# Patient Record
Sex: Female | Born: 1940
Health system: Southern US, Community
[De-identification: ages and names within clinical notes are randomized; demographics above are authoritative.]

## PROBLEM LIST (undated history)

## (undated) DIAGNOSIS — C649 Malignant neoplasm of unspecified kidney, except renal pelvis: Secondary | ICD-10-CM

## (undated) DIAGNOSIS — C189 Malignant neoplasm of colon, unspecified: Secondary | ICD-10-CM

## (undated) DIAGNOSIS — E039 Hypothyroidism, unspecified: Secondary | ICD-10-CM

## (undated) DIAGNOSIS — T4145XA Adverse effect of unspecified anesthetic, initial encounter: Secondary | ICD-10-CM

## (undated) DIAGNOSIS — R51 Headache: Secondary | ICD-10-CM

## (undated) DIAGNOSIS — D759 Disease of blood and blood-forming organs, unspecified: Secondary | ICD-10-CM

## (undated) DIAGNOSIS — H269 Unspecified cataract: Secondary | ICD-10-CM

## (undated) DIAGNOSIS — C787 Secondary malignant neoplasm of liver and intrahepatic bile duct: Secondary | ICD-10-CM

## (undated) DIAGNOSIS — T8859XA Other complications of anesthesia, initial encounter: Secondary | ICD-10-CM

## (undated) DIAGNOSIS — C7900 Secondary malignant neoplasm of unspecified kidney and renal pelvis: Secondary | ICD-10-CM

## (undated) DIAGNOSIS — M810 Age-related osteoporosis without current pathological fracture: Secondary | ICD-10-CM

## (undated) DIAGNOSIS — R112 Nausea with vomiting, unspecified: Secondary | ICD-10-CM

## (undated) DIAGNOSIS — M1990 Unspecified osteoarthritis, unspecified site: Secondary | ICD-10-CM

## (undated) DIAGNOSIS — K760 Fatty (change of) liver, not elsewhere classified: Secondary | ICD-10-CM

## (undated) DIAGNOSIS — D649 Anemia, unspecified: Secondary | ICD-10-CM

## (undated) DIAGNOSIS — Z9889 Other specified postprocedural states: Secondary | ICD-10-CM

## (undated) HISTORY — PX: APPENDECTOMY: SHX54

## (undated) HISTORY — PX: COLON SURGERY: SHX602

## (undated) HISTORY — DX: Fatty (change of) liver, not elsewhere classified: K76.0

## (undated) HISTORY — DX: Age-related osteoporosis without current pathological fracture: M81.0

---

## 1991-11-18 HISTORY — PX: ABDOMINAL HYSTERECTOMY: SHX81

## 1991-11-18 HISTORY — PX: OTHER SURGICAL HISTORY: SHX169

## 1999-10-18 ENCOUNTER — Other Ambulatory Visit: Admission: RE | Admit: 1999-10-18 | Discharge: 1999-10-18 | Payer: Self-pay | Admitting: *Deleted

## 2001-02-05 ENCOUNTER — Other Ambulatory Visit: Admission: RE | Admit: 2001-02-05 | Discharge: 2001-02-05 | Payer: Self-pay | Admitting: *Deleted

## 2004-12-17 ENCOUNTER — Ambulatory Visit: Payer: Self-pay | Admitting: Internal Medicine

## 2005-01-14 ENCOUNTER — Ambulatory Visit: Payer: Self-pay | Admitting: *Deleted

## 2005-02-03 ENCOUNTER — Ambulatory Visit: Payer: Self-pay | Admitting: Internal Medicine

## 2005-04-07 ENCOUNTER — Ambulatory Visit: Payer: Self-pay | Admitting: Internal Medicine

## 2005-04-09 ENCOUNTER — Ambulatory Visit (HOSPITAL_COMMUNITY): Admission: RE | Admit: 2005-04-09 | Discharge: 2005-04-09 | Payer: Self-pay | Admitting: Internal Medicine

## 2005-04-21 ENCOUNTER — Ambulatory Visit: Payer: Self-pay | Admitting: Internal Medicine

## 2005-09-22 ENCOUNTER — Ambulatory Visit: Payer: Self-pay | Admitting: Internal Medicine

## 2005-10-06 ENCOUNTER — Ambulatory Visit: Payer: Self-pay | Admitting: Internal Medicine

## 2005-10-24 ENCOUNTER — Encounter: Admission: RE | Admit: 2005-10-24 | Discharge: 2005-10-24 | Payer: Self-pay | Admitting: Internal Medicine

## 2006-01-05 ENCOUNTER — Ambulatory Visit: Payer: Self-pay | Admitting: Internal Medicine

## 2006-03-27 ENCOUNTER — Ambulatory Visit: Payer: Self-pay | Admitting: Internal Medicine

## 2006-07-03 ENCOUNTER — Ambulatory Visit: Payer: Self-pay | Admitting: Internal Medicine

## 2006-07-06 ENCOUNTER — Ambulatory Visit: Payer: Self-pay | Admitting: Internal Medicine

## 2006-08-31 ENCOUNTER — Ambulatory Visit: Payer: Self-pay | Admitting: Internal Medicine

## 2006-09-22 ENCOUNTER — Ambulatory Visit: Payer: Self-pay | Admitting: Internal Medicine

## 2006-12-07 ENCOUNTER — Ambulatory Visit: Payer: Self-pay | Admitting: Internal Medicine

## 2006-12-29 ENCOUNTER — Ambulatory Visit (HOSPITAL_COMMUNITY): Admission: RE | Admit: 2006-12-29 | Discharge: 2006-12-29 | Payer: Self-pay | Admitting: Internal Medicine

## 2007-01-07 ENCOUNTER — Ambulatory Visit (HOSPITAL_COMMUNITY): Admission: RE | Admit: 2007-01-07 | Discharge: 2007-01-07 | Payer: Self-pay | Admitting: Family Medicine

## 2007-03-08 ENCOUNTER — Ambulatory Visit: Payer: Self-pay | Admitting: Internal Medicine

## 2007-04-13 ENCOUNTER — Ambulatory Visit: Payer: Self-pay | Admitting: Internal Medicine

## 2007-04-15 ENCOUNTER — Ambulatory Visit: Admission: EM | Admit: 2007-04-15 | Discharge: 2007-04-15 | Payer: Self-pay | Admitting: Internal Medicine

## 2007-04-15 ENCOUNTER — Ambulatory Visit: Payer: Self-pay | Admitting: Vascular Surgery

## 2007-04-27 ENCOUNTER — Encounter: Admission: RE | Admit: 2007-04-27 | Discharge: 2007-05-20 | Payer: Self-pay | Admitting: Internal Medicine

## 2007-05-17 ENCOUNTER — Ambulatory Visit: Payer: Self-pay | Admitting: Internal Medicine

## 2007-07-05 ENCOUNTER — Ambulatory Visit: Payer: Self-pay | Admitting: Internal Medicine

## 2007-07-06 ENCOUNTER — Encounter (INDEPENDENT_AMBULATORY_CARE_PROVIDER_SITE_OTHER): Payer: Self-pay | Admitting: Internal Medicine

## 2007-07-07 ENCOUNTER — Ambulatory Visit: Payer: Self-pay | Admitting: Internal Medicine

## 2007-07-07 ENCOUNTER — Encounter (INDEPENDENT_AMBULATORY_CARE_PROVIDER_SITE_OTHER): Payer: Self-pay | Admitting: Nurse Practitioner

## 2007-07-07 LAB — CONVERTED CEMR LAB
Albumin: 4.2 g/dL (ref 3.5–5.2)
BUN: 16 mg/dL (ref 6–23)
CO2: 22 meq/L (ref 19–32)
Calcium: 9.6 mg/dL (ref 8.4–10.5)
Chloride: 104 meq/L (ref 96–112)
Cholesterol: 146 mg/dL (ref 0–200)
Glucose, Bld: 103 mg/dL — ABNORMAL HIGH (ref 70–99)
HDL: 46 mg/dL (ref >39)
Lymphs Abs: 1.7 10*3/uL (ref 0.7–3.3)
Monocytes Relative: 7 % (ref 3–11)
Neutro Abs: 2.6 10*3/uL (ref 1.7–7.7)
Neutrophils Relative %: 54 % (ref 43–77)
Potassium: 4.7 meq/L (ref 3.5–5.3)
RBC: 5.22 M/uL — ABNORMAL HIGH (ref 3.87–5.11)
Triglycerides: 123 mg/dL (ref <150)
WBC: 4.9 10*3/uL (ref 4.0–10.5)

## 2007-07-09 ENCOUNTER — Encounter (INDEPENDENT_AMBULATORY_CARE_PROVIDER_SITE_OTHER): Payer: Self-pay | Admitting: Nurse Practitioner

## 2007-07-09 LAB — CONVERTED CEMR LAB
Amylase: 58 units/L (ref 0–105)
HCV Ab: NEGATIVE
Hepatitis B Surface Ag: NEGATIVE

## 2007-07-12 ENCOUNTER — Telehealth (INDEPENDENT_AMBULATORY_CARE_PROVIDER_SITE_OTHER): Payer: Self-pay | Admitting: *Deleted

## 2007-07-20 ENCOUNTER — Ambulatory Visit (HOSPITAL_COMMUNITY): Admission: RE | Admit: 2007-07-20 | Discharge: 2007-07-20 | Payer: Self-pay | Admitting: Family Medicine

## 2007-08-02 ENCOUNTER — Telehealth (INDEPENDENT_AMBULATORY_CARE_PROVIDER_SITE_OTHER): Payer: Self-pay | Admitting: *Deleted

## 2007-08-02 DIAGNOSIS — N3090 Cystitis, unspecified without hematuria: Secondary | ICD-10-CM

## 2007-08-02 DIAGNOSIS — R945 Abnormal results of liver function studies: Secondary | ICD-10-CM

## 2007-08-02 DIAGNOSIS — E039 Hypothyroidism, unspecified: Secondary | ICD-10-CM

## 2007-08-02 DIAGNOSIS — M1990 Unspecified osteoarthritis, unspecified site: Secondary | ICD-10-CM

## 2007-08-04 ENCOUNTER — Encounter (INDEPENDENT_AMBULATORY_CARE_PROVIDER_SITE_OTHER): Payer: Self-pay | Admitting: *Deleted

## 2007-08-04 ENCOUNTER — Telehealth (INDEPENDENT_AMBULATORY_CARE_PROVIDER_SITE_OTHER): Payer: Self-pay | Admitting: Internal Medicine

## 2007-08-13 ENCOUNTER — Ambulatory Visit: Payer: Self-pay | Admitting: Nurse Practitioner

## 2007-08-13 DIAGNOSIS — K7689 Other specified diseases of liver: Secondary | ICD-10-CM

## 2007-08-13 DIAGNOSIS — R3919 Other difficulties with micturition: Secondary | ICD-10-CM

## 2007-08-13 LAB — CONVERTED CEMR LAB
Bilirubin Urine: NEGATIVE
Glucose, Urine, Semiquant: NEGATIVE
Specific Gravity, Urine: 1.015
Urobilinogen, UA: NEGATIVE
pH: 6

## 2007-09-28 ENCOUNTER — Telehealth (INDEPENDENT_AMBULATORY_CARE_PROVIDER_SITE_OTHER): Payer: Self-pay | Admitting: Nurse Practitioner

## 2007-12-20 ENCOUNTER — Ambulatory Visit: Payer: Self-pay | Admitting: Nurse Practitioner

## 2007-12-20 DIAGNOSIS — M899 Disorder of bone, unspecified: Secondary | ICD-10-CM | POA: Insufficient documentation

## 2007-12-20 DIAGNOSIS — M6740 Ganglion, unspecified site: Secondary | ICD-10-CM

## 2007-12-20 DIAGNOSIS — M949 Disorder of cartilage, unspecified: Secondary | ICD-10-CM

## 2007-12-21 LAB — CONVERTED CEMR LAB
ALT: 33 units/L (ref 0–35)
AST: 56 units/L — ABNORMAL HIGH (ref 0–37)
Albumin: 4.3 g/dL (ref 3.5–5.2)
Alkaline Phosphatase: 119 units/L — ABNORMAL HIGH (ref 39–117)
Basophils Absolute: 0.1 10*3/uL (ref 0.0–0.1)
Basophils Relative: 2 % — ABNORMAL HIGH (ref 0–1)
Eosinophils Absolute: 0.3 10*3/uL (ref 0.0–0.7)
Lymphs Abs: 1.4 10*3/uL (ref 0.7–4.0)
MCV: 84.6 fL (ref 78.0–100.0)
Neutrophils Relative %: 61 % (ref 43–77)
Platelets: 200 10*3/uL (ref 150–400)
Potassium: 4.5 meq/L (ref 3.5–5.3)
Sodium: 140 meq/L (ref 135–145)
Total Protein: 8 g/dL (ref 6.0–8.3)
WBC: 5.2 10*3/uL (ref 4.0–10.5)

## 2007-12-31 ENCOUNTER — Ambulatory Visit (HOSPITAL_COMMUNITY): Admission: RE | Admit: 2007-12-31 | Discharge: 2007-12-31 | Payer: Self-pay | Admitting: Family Medicine

## 2008-02-17 ENCOUNTER — Ambulatory Visit: Payer: Self-pay | Admitting: Internal Medicine

## 2008-02-17 DIAGNOSIS — J069 Acute upper respiratory infection, unspecified: Secondary | ICD-10-CM | POA: Insufficient documentation

## 2008-02-17 DIAGNOSIS — J029 Acute pharyngitis, unspecified: Secondary | ICD-10-CM | POA: Insufficient documentation

## 2008-08-07 ENCOUNTER — Telehealth (INDEPENDENT_AMBULATORY_CARE_PROVIDER_SITE_OTHER): Payer: Self-pay | Admitting: Nurse Practitioner

## 2008-08-08 ENCOUNTER — Ambulatory Visit: Payer: Self-pay | Admitting: Family Medicine

## 2008-08-10 ENCOUNTER — Ambulatory Visit: Payer: Self-pay | Admitting: Internal Medicine

## 2008-08-23 ENCOUNTER — Ambulatory Visit: Payer: Self-pay | Admitting: Family Medicine

## 2008-08-23 ENCOUNTER — Telehealth (INDEPENDENT_AMBULATORY_CARE_PROVIDER_SITE_OTHER): Payer: Self-pay | Admitting: Nurse Practitioner

## 2008-10-02 ENCOUNTER — Encounter (INDEPENDENT_AMBULATORY_CARE_PROVIDER_SITE_OTHER): Payer: Self-pay | Admitting: Family Medicine

## 2008-11-06 ENCOUNTER — Ambulatory Visit: Payer: Self-pay | Admitting: Nurse Practitioner

## 2009-03-15 ENCOUNTER — Ambulatory Visit: Payer: Self-pay | Admitting: Nurse Practitioner

## 2009-04-13 ENCOUNTER — Encounter (INDEPENDENT_AMBULATORY_CARE_PROVIDER_SITE_OTHER): Payer: Self-pay | Admitting: Nurse Practitioner

## 2009-04-13 ENCOUNTER — Ambulatory Visit: Payer: Self-pay | Admitting: Nurse Practitioner

## 2009-04-13 ENCOUNTER — Other Ambulatory Visit: Admission: RE | Admit: 2009-04-13 | Discharge: 2009-04-13 | Payer: Self-pay | Admitting: Internal Medicine

## 2009-04-13 LAB — CONVERTED CEMR LAB
AST: 26 units/L (ref 0–37)
Albumin: 4.2 g/dL (ref 3.5–5.2)
Alkaline Phosphatase: 108 units/L (ref 39–117)
BUN: 15 mg/dL (ref 6–23)
Bilirubin Urine: NEGATIVE
Blood in Urine, dipstick: NEGATIVE
Calcium: 9.5 mg/dL (ref 8.4–10.5)
Chloride: 108 meq/L (ref 96–112)
Eosinophils Relative: 2 % (ref 0–5)
Glucose, Urine, Semiquant: NEGATIVE
HCT: 43.2 % (ref 36.0–46.0)
HDL: 41 mg/dL (ref >39)
Hemoglobin: 13.4 g/dL (ref 12.0–15.0)
KOH Prep: NEGATIVE
LDL Cholesterol: 73 mg/dL (ref 0–99)
Lymphocytes Relative: 22 % (ref 12–46)
Lymphs Abs: 1.2 10*3/uL (ref 0.7–4.0)
Monocytes Absolute: 0.4 10*3/uL (ref 0.1–1.0)
Neutro Abs: 3.9 10*3/uL (ref 1.7–7.7)
OCCULT 1: NEGATIVE
Platelets: 213 10*3/uL (ref 150–400)
Potassium: 4.6 meq/L (ref 3.5–5.3)
Protein, U semiquant: NEGATIVE
Sodium: 145 meq/L (ref 135–145)
TSH: 1.302 microintl units/mL (ref 0.350–4.500)
Total Protein: 7.1 g/dL (ref 6.0–8.3)
WBC: 5.7 10*3/uL (ref 4.0–10.5)

## 2009-04-18 ENCOUNTER — Ambulatory Visit (HOSPITAL_COMMUNITY): Admission: RE | Admit: 2009-04-18 | Discharge: 2009-04-18 | Payer: Self-pay | Admitting: Family Medicine

## 2009-04-19 ENCOUNTER — Encounter (INDEPENDENT_AMBULATORY_CARE_PROVIDER_SITE_OTHER): Payer: Self-pay | Admitting: Nurse Practitioner

## 2009-04-23 ENCOUNTER — Encounter (INDEPENDENT_AMBULATORY_CARE_PROVIDER_SITE_OTHER): Payer: Self-pay | Admitting: Nurse Practitioner

## 2009-07-12 ENCOUNTER — Encounter (INDEPENDENT_AMBULATORY_CARE_PROVIDER_SITE_OTHER): Payer: Self-pay | Admitting: *Deleted

## 2009-08-24 ENCOUNTER — Telehealth (INDEPENDENT_AMBULATORY_CARE_PROVIDER_SITE_OTHER): Payer: Self-pay | Admitting: Nurse Practitioner

## 2009-09-03 ENCOUNTER — Ambulatory Visit: Payer: Self-pay | Admitting: Nurse Practitioner

## 2009-09-03 ENCOUNTER — Telehealth (INDEPENDENT_AMBULATORY_CARE_PROVIDER_SITE_OTHER): Payer: Self-pay | Admitting: Nurse Practitioner

## 2009-09-03 LAB — CONVERTED CEMR LAB: TSH: 4.279 microintl units/mL (ref 0.350–4.500)

## 2009-09-05 ENCOUNTER — Encounter (INDEPENDENT_AMBULATORY_CARE_PROVIDER_SITE_OTHER): Payer: Self-pay | Admitting: Nurse Practitioner

## 2009-09-17 ENCOUNTER — Telehealth (INDEPENDENT_AMBULATORY_CARE_PROVIDER_SITE_OTHER): Payer: Self-pay | Admitting: Nurse Practitioner

## 2010-09-05 ENCOUNTER — Telehealth (INDEPENDENT_AMBULATORY_CARE_PROVIDER_SITE_OTHER): Payer: Self-pay | Admitting: Nurse Practitioner

## 2010-09-11 ENCOUNTER — Encounter (INDEPENDENT_AMBULATORY_CARE_PROVIDER_SITE_OTHER): Payer: Self-pay | Admitting: Nurse Practitioner

## 2010-09-30 ENCOUNTER — Ambulatory Visit: Payer: Self-pay | Admitting: Nurse Practitioner

## 2010-09-30 DIAGNOSIS — R030 Elevated blood-pressure reading, without diagnosis of hypertension: Secondary | ICD-10-CM

## 2010-10-01 LAB — CONVERTED CEMR LAB
AST: 34 units/L (ref 0–37)
Albumin: 4.4 g/dL (ref 3.5–5.2)
BUN: 21 mg/dL (ref 6–23)
Basophils Relative: 0 % (ref 0–1)
CO2: 24 meq/L (ref 19–32)
Calcium: 10.1 mg/dL (ref 8.4–10.5)
Chloride: 106 meq/L (ref 96–112)
Creatinine, Ser: 1.11 mg/dL (ref 0.40–1.20)
Hemoglobin: 14.1 g/dL (ref 12.0–15.0)
Lymphocytes Relative: 22 % (ref 12–46)
Lymphs Abs: 1.4 10*3/uL (ref 0.7–4.0)
Monocytes Absolute: 0.5 10*3/uL (ref 0.1–1.0)
Monocytes Relative: 7 % (ref 3–12)
Neutro Abs: 4.4 10*3/uL (ref 1.7–7.7)
Neutrophils Relative %: 68 % (ref 43–77)
Potassium: 5 meq/L (ref 3.5–5.3)
RBC: 5.16 M/uL — ABNORMAL HIGH (ref 3.87–5.11)
T3, Free: 2.1 pg/mL — ABNORMAL LOW (ref 2.3–4.2)
TSH: 6.966 microintl units/mL — ABNORMAL HIGH (ref 0.350–4.500)
WBC: 6.5 10*3/uL (ref 4.0–10.5)

## 2010-10-08 ENCOUNTER — Encounter (INDEPENDENT_AMBULATORY_CARE_PROVIDER_SITE_OTHER): Payer: Self-pay | Admitting: *Deleted

## 2010-11-04 ENCOUNTER — Ambulatory Visit (HOSPITAL_COMMUNITY)
Admission: RE | Admit: 2010-11-04 | Discharge: 2010-11-04 | Payer: Self-pay | Source: Home / Self Care | Attending: Internal Medicine | Admitting: Internal Medicine

## 2010-11-12 ENCOUNTER — Encounter (INDEPENDENT_AMBULATORY_CARE_PROVIDER_SITE_OTHER): Payer: Self-pay | Admitting: Nurse Practitioner

## 2010-11-12 ENCOUNTER — Ambulatory Visit: Payer: Self-pay | Admitting: Nurse Practitioner

## 2010-11-13 LAB — CONVERTED CEMR LAB: T3, Free: 3.2 pg/mL (ref 2.3–4.2)

## 2010-12-08 ENCOUNTER — Encounter: Payer: Self-pay | Admitting: Internal Medicine

## 2010-12-17 NOTE — Letter (Signed)
Summary: Generic Letter  [facility]  [address] [place], [place] [address]   Phone: [phone]  Fax: [phone]        09/11/2010  [facility] [address] [place], [place]  [address]  Dear Ms. Gwyneth Sprout,  We have been unable to contact you by telephone.  Please call our office, at your earliest convenience, so that we may speak with you.  Sincerely,   Dutch Quint RN

## 2010-12-17 NOTE — Assessment & Plan Note (Signed)
Summary: Hypothyroidism   Vital Signs:  Patient profile:   70 year old female Menstrual status:  hysterectomy Weight:      210.8 pounds BMI:     31.24 Temp:     97.1 degrees F oral Pulse rate:   72 / minute Pulse rhythm:   regular Resp:     16 per minute BP sitting:   148 / 90  (left arm) Cuff size:   large  Vitals Entered By: Levon Hedger (September 30, 2010 12:04 PM)  Nutrition Counseling: Patient's BMI is greater than 25 and therefore counseled on weight management options. CC: follow-up visit thyroid Is Patient Diabetic? No Pain Assessment Patient in pain? no       Does patient need assistance? Functional Status Self care Ambulation Normal   CC:  follow-up visit thyroid.  History of Present Illness:  Pt into the office for f/u on thyoid. She presents today for her labs and f/u. No acute problems today.   Medications Prior to Update: 1)  Levothroid 200 Mcg Tabs (Levothyroxine Sodium) .... One Tablet By Mouth Daily For Thyroid 2)  Oyster-Cal 500 + D 500-125 Mg-Unit  Tabs (Calcium-Vitamin D) .... Take One Tablet By Mouth Two Times A Day 3)  Diclofenac Sodium 75 Mg  Tbec (Diclofenac Sodium) .... One Tablet By Mouth Two Times A Day For Joints 4)  Fosamax 70 Mg Tabs (Alendronate Sodium) .Marland Kitchen.. 1 Tablet By Mouth Weekly For Bones 5)  Zostavax 54098 Unt/0.63ml Solr (Zoster Vaccine Live) .... 0.63ml Subcutaneously X 1 Dose  Current Medications (verified): 1)  Levothroid 200 Mcg Tabs (Levothyroxine Sodium) .... One Tablet By Mouth Daily For Thyroid 2)  Oyster-Cal 500 + D 500-125 Mg-Unit  Tabs (Calcium-Vitamin D) .... Take One Tablet By Mouth Two Times A Day 3)  Diclofenac Sodium 75 Mg  Tbec (Diclofenac Sodium) .... One Tablet By Mouth Two Times A Day For Joints 4)  Fosamax 70 Mg Tabs (Alendronate Sodium) .Marland Kitchen.. 1 Tablet By Mouth Weekly For Bones  Allergies (verified): 1)  ! Asa  Review of Systems ENT:  +scrathy throat. CV:  Denies chest pain or discomfort. Resp:   Denies cough. GI:  Denies abdominal pain, nausea, and vomiting.  Physical Exam  General:  alert.   Head:  normocephalic.   Lungs:  normal breath sounds.   Heart:  normal rate and regular rhythm.   Neurologic:  alert & oriented X3.   Skin:  color normal.   Psych:  Oriented X3.     Impression & Recommendations:  Problem # 1:  HYPOTHYROIDISM (ICD-244.9) will check labs today Her updated medication list for this problem includes:    Levothroid 200 Mcg Tabs (Levothyroxine sodium) ..... One tablet by mouth daily for thyroid  Orders: UA Dipstick w/o Micro (manual) (11914) T-TSH (78295-62130) T-T3, Free [phone]) T-Free T4 (23300)  Problem # 2:  OSTEOARTHRITIS (ICD-715.90) will refill meds Her updated medication list for this problem includes:    Diclofenac Sodium 75 Mg Tbec (Diclofenac sodium) ..... One tablet by mouth two times a day for joints  Problem # 3:  OTHER SCREENING MAMMOGRAM (ICD-V76.12)  Orders: Mammogram (Screening) (Mammo)  Problem # 4:  ELEVATED BLOOD PRESSURE WITHOUT DIAGNOSIS OF HYPERTENSION (ICD-796.2) handout given to pt advise DASH diet  Complete Medication List: 1)  Levothroid 200 Mcg Tabs (Levothyroxine sodium) .... One tablet by mouth daily for thyroid 2)  Oyster-cal 500 + D 500-125 Mg-unit Tabs (Calcium-vitamin d) .... Take one tablet by mouth two times a day 3)  Diclofenac Sodium 75 Mg Tbec (Diclofenac sodium) .... One tablet by mouth two times a day for joints 4)  Fosamax 70 Mg Tabs (Alendronate sodium) .Marland Kitchen.. 1 tablet by mouth weekly for bones  Other Orders: T-Comprehensive Metabolic Panel (96295-28413) T-CBC w/Diff (24401-02725)  Patient Instructions: 1)  Be mindful that you are due for a colonscopy.  when you are ready for this referral inform this office and we will make the referral. 2)  Keep your appointment for mammogram 3)  Schedule follow up appointment in 1 month for lipids and blood pressure check. Goal 140/90 4)  No food after  midnght before this visit. May drink water 5)  Blood pressure - 148/90 6)  You will need to monitor the sodium in your diet as this will increase your blood pressure.  If blood pressure is consecutively high then notify this office. Prescriptions: FOSAMAX 70 MG TABS (ALENDRONATE SODIUM) 1 tablet by mouth weekly for bones  #4 Each x 10   Entered and Authorized by:   Lehman Prom FNP   Signed by:   Lehman Prom FNP on 09/30/2010   Method used:   Electronically to        [facility] (retail)       [address]       [place], [place]  [address]       Ph: [phone]       Fax: [phone]   RxID:   [id number] DICLOFENAC SODIUM 75 MG  TBEC (DICLOFENAC SODIUM) One tablet by mouth two times a day for joints  #60 x 3   Entered and Authorized by:   Lehman Prom FNP   Signed by:   Lehman Prom FNP on 09/30/2010   Method used:   Electronically to        [facility] (retail)       [address]       [place], [place]  [address]       Ph: [phone]       Fax: [phone]   RxID:   [id number]    Orders Added: 1)  Est. Patient Level III [69485] 2)  Mammogram (Screening) [Mammo] 3)  UA Dipstick w/o Micro (manual) [81002] 4)  Est. Patient Level III [46270] 5)  T-Comprehensive Metabolic Panel [80053-22900] 6)  T-CBC w/Diff [35009-38182] 7)  T-TSH [99371-69678] 8)  T-T3, Free [93810-17510] 9)  T-Free T4 [23300]    Prevention & Chronic Care Immunizations   Influenza vaccine: given at [facility]  (09/02/2010)   Influenza vaccine deferral: Not indicated  (09/30/2010)    Tetanus booster: 03/17/2006: given    Pneumococcal vaccine: Pneumovax  (04/13/2009)    H. zoster vaccine: Not documented  Colorectal Screening   Hemoccult: Not documented    Colonoscopy: Not documented  Other Screening   Pap smear:  Specimen Adequacy: Satisfactory for evaluation.   Interpretation/Result:Negative for  intraepithelial Lesion or Malignancy.   Interpretation/Result:Atrophy.     (04/13/2009)    Mammogram: ASSESSMENT: Negative - BI-RADS 1^MM DIGITAL SCREENING  (04/18/2009)    DXA bone density scan: Not documented   Smoking status: never  (03/15/2009)  Lipids   Total Cholesterol: 139  (04/13/2009)   LDL: 73  (04/13/2009)   LDL Direct: Not documented   HDL: 41  (04/13/2009)   Triglycerides: 125  (04/13/2009)

## 2010-12-17 NOTE — Progress Notes (Signed)
Summary: Query:  Refill levothyroxine?  Phone Note Outgoing Call   Summary of Call: Do you want to refill her thyroid med?  Not seen since 08/2009.   Initial call taken by: Dutch Quint RN,  September 05, 2010 3:31 PM  Follow-up for Phone Call        refill x 30 days she needs a f/u visit Follow-up by: Lehman Prom FNP,  September 05, 2010 5:51 PM  Additional Follow-up for Phone Call Additional follow up Details #1::        Noted.  Refill completed.  Left message on answering machine for pt. to return call.  Dutch Quint RN  September 06, 2010 3:32 PM  Left message on answering machine for pt. to return call.  Dutch Quint RN  September 10, 2010 4:44 PM  Left message on answering machine for pt. to return call.  Letter sent.  Dutch Quint RN  September 11, 2010 10:35 AM    Additional Follow-up for Phone Call Additional follow up Details #2::

## 2010-12-17 NOTE — Letter (Signed)
Summary: *HSN Results Follow up  [facility]  [address] [place], [place] [address]   Phone: [phone]  Fax: [phone]      10/08/2010   [facility] [address] [place], [place]  [address]   Dear  Ms. Ivorie Shaker,                            ____S.Drinkard,FNP   ____D. Gore,FNP       ____B. McPherson,MD   ____V. Rankins,MD    ____E. Mulberry,MD    ____N. Daphine Deutscher, FNP  ____D. Reche Dixon, MD    ____K. Philipp Deputy, MD    ____Other     This letter is to inform you that your recent test(s):  _______Pap Smear    _______Lab Test     _______X-ray    _______ is within acceptable limits  ___X____ requires a medication change  ___X____ requires a follow-up lab visit  _______ requires a follow-up visit with your provider   Comments:  We have been trying to reach you at [phone].  Please contact office your earliest convenience.       _________________________________________________________ If you have any questions, please contact our office                     Sincerely,  [place] Shannon [facility]

## 2010-12-19 NOTE — Assessment & Plan Note (Signed)
Summary: HTN   Vital Signs:  Patient profile:   70 year old female Menstrual status:  hysterectomy Weight:      210.4 pounds BMI:     31.18 Temp:     97.8 degrees F oral Pulse rate:   72 / minute Pulse rhythm:   regular Resp:     16 per minute BP sitting:   134 / 78  (left arm) Cuff size:   large  Vitals Entered By: Levon Hedger (November 12, 2010 11:05 AM)  Nutrition Counseling: Patient's BMI is greater than 25 and therefore counseled on weight management options. CC: follow-up visit thyroid, Hypertension Management Is Patient Diabetic? No Pain Assessment Patient in pain? no       Does patient need assistance? Functional Status Self care Ambulation Normal   CC:  follow-up visit thyroid and Hypertension Management.  History of Present Illness:  Pt into the office for f/u on thyroid. Dose increased during her last visit. Pt has been taking meds according to the change  Headache - woke today with migraine No current meds as she does not usually have migraines admits to eating ham over the weekend which did have some sodium she had been restricting sodium following last visit in efforts to lower blood  Hypertension History:      She complains of headache, but denies chest pain and palpitations.  no current meds.        Positive major cardiovascular risk factors include female age 56 years old or older and family history for ischemic heart disease (females less than 24 years old).  Negative major cardiovascular risk factors include non-tobacco-user status.     Habits & Providers  Alcohol-Tobacco-Diet     Tobacco Status: never     Passive Smoke Exposure: no  Exercise-Depression-Behavior     Does Patient Exercise: no     Drug Use: no     Seat Belt Use: 100     Sun Exposure: occasionally  Allergies (verified): 1)  ! Asa  Review of Systems General:  Denies fever. CV:  Denies chest pain or discomfort. Resp:  Denies cough. GI:  Denies abdominal pain,  nausea, and vomiting. Neuro:  Complains of headaches.  Physical Exam  General:  alert.   Head:  normocephalic.   Ears:  ear piercing(s) noted.   Lungs:  normal breath sounds.   Heart:  normal rate and regular rhythm.   Abdomen:  normal bowel sounds.   Msk:  up to the exam table Neurologic:  alert & oriented X3.   Skin:  color normal.   Psych:  Oriented X3.     Impression & Recommendations:  Problem # 1:  HYPOTHYROIDISM (ICD-244.9) will recheck labs today  Her updated medication list for this problem includes:    Levothroid 200 Mcg Tabs (Levothyroxine sodium) ..... One tablet by mouth daily for thyroid (take along with per day)    Levothroid 25 Mcg Tabs (Levothyroxine sodium) .Marland Kitchen... 1/2 tablet by mouth daily  (take along with )  Orders: T-TSH (78295-62130) T-T3, Free [phone]) T-Free T4 (23300)  Problem # 2:  ELEVATED BLOOD PRESSURE WITHOUT DIAGNOSIS OF HYPERTENSION (ICD-796.2) better today advised decrease in sodium  Complete Medication List: 1)  Levothroid 200 Mcg Tabs (Levothyroxine sodium) .... One tablet by mouth daily for thyroid (take along with per day) 2)  Oyster-cal 500 + D 500-125 Mg-unit Tabs (Calcium-vitamin d) .... Take one tablet by mouth two times a day 3)  Diclofenac Sodium 75 Mg Tbec (Diclofenac  sodium) .... One tablet by mouth two times a day for joints 4)  Fosamax 70 Mg Tabs (Alendronate sodium) .Marland Kitchen.. 1 tablet by mouth weekly for bones 5)  Levothroid 25 Mcg Tabs (Levothyroxine sodium) .... 1/2 tablet by mouth daily  (take along with )  Hypertension Assessment/Plan:      The patient's hypertensive risk group is category B: At least one risk factor (excluding diabetes) with no target organ damage.  Her calculated 10 year risk of coronary heart disease is 8 %.  Today's blood pressure is 134/78.  Her blood pressure goal is < 140/90.  Patient Instructions: 1)  Schedule lab visit in January - fasting for lipids 2)  no food after  midnight before this visit 3)  Blood pressure - doing better.  Continue to  monitor sodium in your diet. 4)  Headache - may be due to change in diet over the weekend.  May take tylenol extra strength.  Drink plenty of water.  Call office if headache does not subside in 48-72 hours. 5)  Thyroid - labs will be checked today and you will be notified of the results. 6)  Follow up in 6 months with n.martin, fnp for blood pressure and thyroid.  Will discuss colonscopy   Orders Added: 1)  Est. Patient Level III [[address]] 2)  T-TSH [60454-09811] 3)  T-T3, Free [91478-29562] 4)  T-Free T4 [23300]    Prevention & Chronic Care Immunizations   Influenza vaccine: given at [facility]  (09/02/2010)   Influenza vaccine deferral: Not indicated  (09/30/2010)    Tetanus booster: 03/17/2006: given    Pneumococcal vaccine: Pneumovax  (04/13/2009)    H. zoster vaccine: Not documented  Colorectal Screening   Hemoccult: Not documented    Colonoscopy: Not documented   Colonoscopy action/deferral: Refused  (11/12/2010)  Other Screening   Pap smear:  Specimen Adequacy: Satisfactory for evaluation.   Interpretation/Result:Negative for intraepithelial Lesion or Malignancy.   Interpretation/Result:Atrophy.     (04/13/2009)    Mammogram: ASSESSMENT: Negative - BI-RADS 1^MM DIGITAL SCREENING  (11/04/2010)    DXA bone density scan: Not documented   Smoking status: never  (11/12/2010)  Lipids   Total Cholesterol: 139  (04/13/2009)   LDL: 73  (04/13/2009)   LDL Direct: Not documented   HDL: 41  (04/13/2009)   Triglycerides: 125  (04/13/2009)

## 2011-01-17 ENCOUNTER — Encounter (INDEPENDENT_AMBULATORY_CARE_PROVIDER_SITE_OTHER): Payer: Self-pay | Admitting: Nurse Practitioner

## 2011-01-20 ENCOUNTER — Encounter (INDEPENDENT_AMBULATORY_CARE_PROVIDER_SITE_OTHER): Payer: Self-pay | Admitting: Nurse Practitioner

## 2011-01-20 LAB — CONVERTED CEMR LAB
Cholesterol: 168 mg/dL (ref 0–200)
HDL: 53 mg/dL (ref >39)
TSH: 0.059 microintl units/mL — ABNORMAL LOW (ref 0.350–4.500)
Total CHOL/HDL Ratio: 3.2
VLDL: 27 mg/dL (ref 0–40)

## 2011-01-28 NOTE — Letter (Signed)
Summary: Lipid Letter  [facility]  [address] [place], [place] [address]   Phone: [phone]  Fax: [phone]    01/20/2011  Nichole Cross [address] [place], [place]  [address]  Dear Ms. Folden:  We have carefully reviewed your last lipid profile from 01/17/2011 and the results are noted below with a summary of recommendations for lipid management.    Cholesterol:       168     Goal: less than 200   HDL "good" Cholesterol:   53     Goal: greater than 40   LDL "bad" Cholesterol:   88     Goal: less than 130   Triglycerides:       137     Goal: less than 150    Cholesterol labs look good.  Keep up the efforts at diet control.  You should have been contact by this office regarding your thyroid labs and medications changes (see below).  You should schedule a lab visit in July 2012 to recheck thyroid.      Current Medications: 1)    Levothroid 200 Mcg Tabs (Levothyroxine sodium) .... One tablet by mouth daily for thyroid (take along with on Tuesday and Thursday) 2)    Oyster-cal 500 + D 500-125 Mg-unit  Tabs (Calcium-vitamin d) .... Take one tablet by mouth two times a day 3)    Diclofenac Sodium 75 Mg  Tbec (Diclofenac sodium) .... One tablet by mouth two times a day for joints 4)    Fosamax 70 Mg Tabs (Alendronate sodium) .Marland Kitchen.. 1 tablet by mouth weekly for bones 5)    Levothroid 25 Mcg Tabs (Levothyroxine sodium) .... 1/2 tablet by mouth on Tuesday and thursday  If you have any questions, please call. We appreciate being able to work with you.   Sincerely,    [facility] Lehman Prom FNP

## 2011-07-15 ENCOUNTER — Other Ambulatory Visit: Payer: Self-pay | Admitting: Internal Medicine

## 2011-07-15 DIAGNOSIS — M25561 Pain in right knee: Secondary | ICD-10-CM

## 2011-07-24 ENCOUNTER — Ambulatory Visit (HOSPITAL_COMMUNITY)
Admission: RE | Admit: 2011-07-24 | Discharge: 2011-07-24 | Disposition: A | Discharge status: Home / Self Care | Payer: Medicare Other | Source: Ambulatory Visit | Attending: Internal Medicine | Admitting: Internal Medicine

## 2011-07-24 DIAGNOSIS — R609 Edema, unspecified: Secondary | ICD-10-CM | POA: Insufficient documentation

## 2011-07-24 DIAGNOSIS — M1710 Unilateral primary osteoarthritis, unspecified knee: Secondary | ICD-10-CM | POA: Insufficient documentation

## 2011-07-24 DIAGNOSIS — IMO0002 Reserved for concepts with insufficient information to code with codable children: Secondary | ICD-10-CM | POA: Insufficient documentation

## 2011-07-24 DIAGNOSIS — M25561 Pain in right knee: Secondary | ICD-10-CM

## 2011-07-24 DIAGNOSIS — M25569 Pain in unspecified knee: Secondary | ICD-10-CM | POA: Insufficient documentation

## 2011-10-21 ENCOUNTER — Ambulatory Visit: Payer: Medicare Other | Attending: Family Medicine | Admitting: Rehabilitative and Restorative Service Providers"

## 2011-10-21 DIAGNOSIS — IMO0001 Reserved for inherently not codable concepts without codable children: Secondary | ICD-10-CM | POA: Insufficient documentation

## 2011-10-21 DIAGNOSIS — M25669 Stiffness of unspecified knee, not elsewhere classified: Secondary | ICD-10-CM | POA: Insufficient documentation

## 2011-10-21 DIAGNOSIS — R269 Unspecified abnormalities of gait and mobility: Secondary | ICD-10-CM | POA: Insufficient documentation

## 2011-10-21 DIAGNOSIS — M25569 Pain in unspecified knee: Secondary | ICD-10-CM | POA: Insufficient documentation

## 2011-10-29 ENCOUNTER — Ambulatory Visit: Payer: Medicare Other | Admitting: Physical Therapy

## 2011-11-03 ENCOUNTER — Ambulatory Visit: Payer: Medicare Other | Admitting: Rehabilitative and Restorative Service Providers"

## 2011-11-05 ENCOUNTER — Ambulatory Visit: Payer: Medicare Other | Admitting: Physical Therapy

## 2011-11-17 ENCOUNTER — Encounter: Payer: Medicare Other | Admitting: Rehabilitative and Restorative Service Providers"

## 2011-11-20 ENCOUNTER — Encounter: Payer: Medicare Other | Admitting: Rehabilitative and Restorative Service Providers"

## 2012-09-01 NOTE — H&P (Signed)
TOTAL KNEE ADMISSION H&P  Patient is being admitted for left medial unicompartmental knee arthroplasty.  Subjective:  Chief Complaint:left knee pain.  HPI: Nichole Cross, 71 y.o. female, has a history of pain and functional disability in the left knee due to arthritis and has failed non-surgical conservative treatments for greater than 12 weeks to includeNSAID's and/or analgesics, corticosteriod injections and activity modification.  Onset of symptoms was gradual, starting 2 years ago with rapidlly worsening course since that time. The patient noted no past surgery on the left knee(s).  Patient currently rates pain in the left knee(s) at 8 out of 10 with activity. Patient has worsening of pain with activity and weight bearing and crepitus.  Patient has evidence of periarticular osteophytes and joint space narrowing by imaging studies. Risks, benefits and expectations were discussed with the patient. Patient understand the risks, benefits and expectations and wishes to proceed with surgery.  D/C Plans:  Home with HHPT  Post-op Meds:  Rx given for ASA, Robaxin, Celebrex, Iron, Colace and MiraLax  Tranexamic Acid:  To be given  Decadron:   To be given  PMH: Migraines Thyroid disease Osteoporosis OA  PSH: Excision of a tumor  Total hysterectomy  Medications: Tramadol  50 mg 1-2 PO q 6 hrs prn pain Fosamax  10 mg 1 PO q wk (Currently out of and will not take the Sunday prior to surgery) Naproxen    Will stop 7-10 days prior to surgery Synthroid  Unknown dose  Allergies  Allergen Reactions  . Vicoden     REACTION: stomach upset    History  Substance Use Topics  . Smoking status: Never  . Smokeless tobacco: Never  . Alcohol Use: Occasional      Review of Systems  Constitutional: Negative.   HENT: Negative.   Eyes: Negative.   Respiratory: Negative.   Cardiovascular: Negative.   Gastrointestinal: Negative.   Genitourinary: Negative.   Musculoskeletal: Positive for  joint pain.  Skin: Negative.   Neurological: Negative.   Endo/Heme/Allergies: Positive for environmental allergies.  Psychiatric/Behavioral: Negative.     Objective:  Physical Exam  Constitutional: She is oriented to person, place, and time. She appears well-developed and well-nourished.  HENT:  Head: Normocephalic and atraumatic.  Nose: Nose normal.  Mouth/Throat: Oropharynx is clear and moist.  Eyes: Pupils are equal, round, and reactive to light.  Neck: Neck supple. No JVD present. No tracheal deviation present. No thyromegaly present.  Cardiovascular: Normal rate, regular rhythm, normal heart sounds and intact distal pulses.   Respiratory: Effort normal and breath sounds normal. No stridor. No respiratory distress. She has no wheezes.  GI: Soft. There is no tenderness. There is no guarding.  Musculoskeletal:       Left knee: She exhibits decreased range of motion (5-95), swelling and bony tenderness. She exhibits no effusion, no ecchymosis, no deformity and no erythema. tenderness found.  Lymphadenopathy:    She has no cervical adenopathy.  Neurological: She is alert and oriented to person, place, and time.  Skin: Skin is warm and dry.  Psychiatric: She has a normal mood and affect.    Vital signs in last 24 hours: BP : 145/73 ; HR : 69 ; Resp : 16 ;  Labs:  Estimated Body mass index is 31.07 kg/(m^2) as calculated from the following:   Height as of 03/15/09: 5\' 9" (1.753 m).   Weight as of 11/12/10: 210 lb 6.4 oz(95.437 kg).   Imaging Review Plain radiographs demonstrate moderate degenerative joint disease of  the medial left knee(s). The overall alignment ismild varus. The bone quality appears to be good for age and reported activity level.  Assessment/Plan:  End stage arthritis of the medial compartment, left knee   The patient history, physical examination, clinical judgment of the provider and imaging studies are consistent with end stage degenerative joint disease  of the left knee(s) and medial unicompartmental knee arthroplasty is deemed medically necessary. The treatment options including medical management, injection therapy arthroscopy and arthroplasty were discussed at length. The risks and benefits of total knee arthroplasty were presented and reviewed. The risks due to aseptic loosening, infection, stiffness, patella tracking problems, thromboembolic complications and other imponderables were discussed. The patient acknowledged the explanation, agreed to proceed with the plan and consent was signed. Patient is being admitted for inpatient treatment for surgery, pain control, PT, OT, prophylactic antibiotics, VTE prophylaxis, progressive ambulation and ADL's and discharge planning. The patient is planning to be discharged home with home health services.    Anastasio Auerbach Nichole Cross   PAC  09/01/2012, 11:33 AM

## 2012-09-03 ENCOUNTER — Encounter (HOSPITAL_COMMUNITY): Payer: Self-pay | Admitting: Pharmacy Technician

## 2012-09-08 ENCOUNTER — Encounter (HOSPITAL_COMMUNITY)
Admission: RE | Admit: 2012-09-08 | Discharge: 2012-09-08 | Disposition: A | Discharge status: Home / Self Care | Payer: Medicare Other | Source: Ambulatory Visit | Attending: Orthopedic Surgery | Admitting: Orthopedic Surgery

## 2012-09-08 ENCOUNTER — Encounter (HOSPITAL_COMMUNITY): Payer: Self-pay

## 2012-09-08 HISTORY — DX: Unspecified osteoarthritis, unspecified site: M19.90

## 2012-09-08 HISTORY — DX: Headache: R51

## 2012-09-08 LAB — URINALYSIS, ROUTINE W REFLEX MICROSCOPIC
Bilirubin Urine: NEGATIVE
Glucose, UA: NEGATIVE mg/dL
Hgb urine dipstick: NEGATIVE
Specific Gravity, Urine: 1.019 (ref 1.005–1.030)
pH: 6 (ref 5.0–8.0)

## 2012-09-08 LAB — CBC
MCH: 25.5 pg — ABNORMAL LOW (ref 26.0–34.0)
MCHC: 33.1 g/dL (ref 30.0–36.0)
Platelets: 265 10*3/uL (ref 150–400)
RBC: 5.38 MIL/uL — ABNORMAL HIGH (ref 3.87–5.11)
RDW: 15.7 % — ABNORMAL HIGH (ref 11.5–15.5)

## 2012-09-08 LAB — SURGICAL PCR SCREEN: MRSA, PCR: NEGATIVE

## 2012-09-08 LAB — BASIC METABOLIC PANEL
BUN: 11 mg/dL (ref 6–23)
Calcium: 9.7 mg/dL (ref 8.4–10.5)
Creatinine, Ser: 0.93 mg/dL (ref 0.50–1.10)
GFR calc non Af Amer: 61 mL/min — ABNORMAL LOW (ref >90)
Glucose, Bld: 100 mg/dL — ABNORMAL HIGH (ref 70–99)
Sodium: 139 mEq/L (ref 135–145)

## 2012-09-08 LAB — ABO/RH: ABO/RH(D): O POS

## 2012-09-08 LAB — PROTIME-INR: Prothrombin Time: 12.9 seconds (ref 11.6–15.2)

## 2012-09-08 NOTE — Patient Instructions (Addendum)
20      Your procedure is scheduled on:  Thursday 09/09/2012 at 0730 am  Report to [facility] at  0530 AM.  Call this number if you have problems the morning of surgery: [phone]   Remember:   Do not eat food or drink liquids after midnight!  Take these medicines the morning of surgery with A SIP OF WATER: Synthroid   Do not bring valuables to the hospital.  .  Leave suitcase in the car. After surgery it may be brought to your room.  For patients admitted to the hospital, checkout time is 11:00 AM the day of              Discharge.    Special Instructions: See [facility] Preparing  For Surgery Instruction Sheet. Do not wear jewelry, lotions powders, perfumes. Women do not shave legs or underarms for 12 hours before showers. Contacts, partial plates, or dentures may not be worn into surgery.                          Patients discharged the day of surgery will not be allowed to drive home. If going home the same day of surgery, must have someone stay with you first 24 hrs.at home and arrange for someone to drive you home from the Hospital.               Please read over the following fact sheets that you were given: MRSA              INFORMATION, sleep apnea sheet, Incentive spirometry sheet, Blood transfusion sheet               Telford Nab.Jenell Dobransky,RN,BSN [phone]

## 2012-09-08 NOTE — Progress Notes (Signed)
EKG from 08/16/2012 from [facility] on chart.Pre-operative clearance and last office visit  from Dr. Reche Dixon 08/18/2012 on chart

## 2012-09-09 ENCOUNTER — Ambulatory Visit (HOSPITAL_COMMUNITY): Payer: Medicare Other | Admitting: Anesthesiology

## 2012-09-09 ENCOUNTER — Inpatient Hospital Stay (HOSPITAL_COMMUNITY)
Admission: RE | Admit: 2012-09-09 | Discharge: 2012-09-10 | DRG: 470 | Disposition: A | Discharge status: Home Health | Payer: Medicare Other | Source: Ambulatory Visit | Attending: Orthopedic Surgery | Admitting: Orthopedic Surgery

## 2012-09-09 ENCOUNTER — Encounter (HOSPITAL_COMMUNITY)
Admission: RE | Disposition: A | Discharge status: Home Health | Payer: Self-pay | Source: Ambulatory Visit | Attending: Orthopedic Surgery

## 2012-09-09 ENCOUNTER — Encounter (HOSPITAL_COMMUNITY): Payer: Self-pay | Admitting: *Deleted

## 2012-09-09 ENCOUNTER — Encounter (HOSPITAL_COMMUNITY): Payer: Self-pay | Admitting: Anesthesiology

## 2012-09-09 DIAGNOSIS — E871 Hypo-osmolality and hyponatremia: Secondary | ICD-10-CM | POA: Diagnosis not present

## 2012-09-09 DIAGNOSIS — E663 Overweight: Secondary | ICD-10-CM

## 2012-09-09 DIAGNOSIS — M810 Age-related osteoporosis without current pathological fracture: Secondary | ICD-10-CM | POA: Diagnosis present

## 2012-09-09 DIAGNOSIS — Z96652 Presence of left artificial knee joint: Secondary | ICD-10-CM

## 2012-09-09 DIAGNOSIS — J309 Allergic rhinitis, unspecified: Secondary | ICD-10-CM | POA: Diagnosis present

## 2012-09-09 DIAGNOSIS — E039 Hypothyroidism, unspecified: Secondary | ICD-10-CM | POA: Diagnosis present

## 2012-09-09 DIAGNOSIS — D62 Acute posthemorrhagic anemia: Secondary | ICD-10-CM | POA: Diagnosis not present

## 2012-09-09 DIAGNOSIS — Z885 Allergy status to narcotic agent status: Secondary | ICD-10-CM

## 2012-09-09 DIAGNOSIS — Z90710 Acquired absence of both cervix and uterus: Secondary | ICD-10-CM

## 2012-09-09 DIAGNOSIS — Z79899 Other long term (current) drug therapy: Secondary | ICD-10-CM

## 2012-09-09 DIAGNOSIS — M1710 Unilateral primary osteoarthritis, unspecified knee: Principal | ICD-10-CM | POA: Diagnosis present

## 2012-09-09 DIAGNOSIS — Z6827 Body mass index (BMI) 27.0-27.9, adult: Secondary | ICD-10-CM

## 2012-09-09 DIAGNOSIS — Z01812 Encounter for preprocedural laboratory examination: Secondary | ICD-10-CM

## 2012-09-09 DIAGNOSIS — D500 Iron deficiency anemia secondary to blood loss (chronic): Secondary | ICD-10-CM

## 2012-09-09 HISTORY — PX: PARTIAL KNEE ARTHROPLASTY: SHX2174

## 2012-09-09 SURGERY — ARTHROPLASTY, KNEE, UNICOMPARTMENTAL
Anesthesia: Spinal | Site: Knee | Laterality: Left | Wound class: Clean

## 2012-09-09 MED ORDER — KETOROLAC TROMETHAMINE 30 MG/ML IJ SOLN
INTRAMUSCULAR | Status: AC
  Filled 2012-09-09: qty 1

## 2012-09-09 MED ORDER — POLYETHYLENE GLYCOL 3350 17 G PO PACK
17.0000 g | PACK | Freq: Two times a day (BID) | ORAL | Status: DC
  Administered 2012-09-10: 17 g via ORAL

## 2012-09-09 MED ORDER — METHOCARBAMOL 500 MG PO TABS
500.0000 mg | ORAL_TABLET | Freq: Four times a day (QID) | ORAL | Status: DC | PRN
  Administered 2012-09-10: 500 mg via ORAL
  Filled 2012-09-09: qty 1

## 2012-09-09 MED ORDER — ONDANSETRON HCL 4 MG/2ML IJ SOLN
4.0000 mg | Freq: Four times a day (QID) | INTRAMUSCULAR | Status: DC | PRN
  Administered 2012-09-09 (×2): 4 mg via INTRAVENOUS
  Filled 2012-09-09 (×2): qty 2

## 2012-09-09 MED ORDER — PHENOL 1.4 % MT LIQD: 1.0000 | OROMUCOSAL | Status: DC | PRN

## 2012-09-09 MED ORDER — BUPIVACAINE-EPINEPHRINE 0.25% -1:200000 IJ SOLN
INTRAMUSCULAR | Status: DC | PRN
  Administered 2012-09-09: 30 mL

## 2012-09-09 MED ORDER — MIDAZOLAM HCL 5 MG/5ML IJ SOLN
INTRAMUSCULAR | Status: DC | PRN
  Administered 2012-09-09: 2 mg via INTRAVENOUS

## 2012-09-09 MED ORDER — DEXAMETHASONE SODIUM PHOSPHATE 10 MG/ML IJ SOLN: 10.0000 mg | Freq: Once | INTRAMUSCULAR | Status: DC

## 2012-09-09 MED ORDER — PROPOFOL 10 MG/ML IV BOLUS
INTRAVENOUS | Status: DC | PRN
  Administered 2012-09-09 (×2): 20 mg via INTRAVENOUS

## 2012-09-09 MED ORDER — PROMETHAZINE HCL 25 MG/ML IJ SOLN
12.5000 mg | Freq: Four times a day (QID) | INTRAMUSCULAR | Status: DC | PRN
  Administered 2012-09-09: 12.5 mg via INTRAVENOUS
  Filled 2012-09-09: qty 1

## 2012-09-09 MED ORDER — ACETAMINOPHEN 10 MG/ML IV SOLN
INTRAVENOUS | Status: DC | PRN
  Administered 2012-09-09: 1000 mg via INTRAVENOUS

## 2012-09-09 MED ORDER — ONDANSETRON HCL 4 MG PO TABS: 4.0000 mg | ORAL_TABLET | Freq: Four times a day (QID) | ORAL | Status: DC | PRN

## 2012-09-09 MED ORDER — DIPHENHYDRAMINE HCL 25 MG PO CAPS: 25.0000 mg | ORAL_CAPSULE | Freq: Four times a day (QID) | ORAL | Status: DC | PRN

## 2012-09-09 MED ORDER — ZOLPIDEM TARTRATE 5 MG PO TABS: 5.0000 mg | ORAL_TABLET | Freq: Every evening | ORAL | Status: DC | PRN

## 2012-09-09 MED ORDER — FLEET ENEMA 7-19 GM/118ML RE ENEM: 1.0000 | ENEMA | Freq: Once | RECTAL | Status: AC | PRN

## 2012-09-09 MED ORDER — RIVAROXABAN 10 MG PO TABS
10.0000 mg | ORAL_TABLET | ORAL | Status: DC
  Administered 2012-09-10: 10 mg via ORAL
  Filled 2012-09-09 (×2): qty 1

## 2012-09-09 MED ORDER — MEPERIDINE HCL 50 MG/ML IJ SOLN: 6.2500 mg | INTRAMUSCULAR | Status: DC | PRN

## 2012-09-09 MED ORDER — PROPOFOL INFUSION 10 MG/ML OPTIME
INTRAVENOUS | Status: DC | PRN
  Administered 2012-09-09: 75 ug/kg/min via INTRAVENOUS

## 2012-09-09 MED ORDER — CEFAZOLIN SODIUM-DEXTROSE 2-3 GM-% IV SOLR
INTRAVENOUS | Status: AC
  Filled 2012-09-09: qty 50

## 2012-09-09 MED ORDER — KETAMINE HCL 50 MG/ML IJ SOLN
INTRAMUSCULAR | Status: DC | PRN
  Administered 2012-09-09: 25 mg via INTRAMUSCULAR

## 2012-09-09 MED ORDER — DEXAMETHASONE SODIUM PHOSPHATE 10 MG/ML IJ SOLN
10.0000 mg | Freq: Once | INTRAMUSCULAR | Status: AC
  Administered 2012-09-10: 10 mg via INTRAVENOUS
  Filled 2012-09-09: qty 1

## 2012-09-09 MED ORDER — DEXAMETHASONE SODIUM PHOSPHATE 10 MG/ML IJ SOLN
INTRAMUSCULAR | Status: DC | PRN
  Administered 2012-09-09: 10 mg via INTRAVENOUS

## 2012-09-09 MED ORDER — BUPIVACAINE HCL (PF) 0.75 % IJ SOLN
INTRAMUSCULAR | Status: DC | PRN
  Administered 2012-09-09: 13.5 mg

## 2012-09-09 MED ORDER — CEFAZOLIN SODIUM-DEXTROSE 2-3 GM-% IV SOLR
2.0000 g | INTRAVENOUS | Status: AC
  Administered 2012-09-09: 2 g via INTRAVENOUS

## 2012-09-09 MED ORDER — ALUM & MAG HYDROXIDE-SIMETH 200-200-20 MG/5ML PO SUSP
30.0000 mL | ORAL | Status: DC | PRN
  Administered 2012-09-10: 30 mL via ORAL
  Filled 2012-09-09: qty 30

## 2012-09-09 MED ORDER — HYDROMORPHONE HCL PF 1 MG/ML IJ SOLN: 0.2500 mg | INTRAMUSCULAR | Status: DC | PRN

## 2012-09-09 MED ORDER — LEVOTHYROXINE SODIUM 100 MCG PO TABS
100.0000 ug | ORAL_TABLET | Freq: Every day | ORAL | Status: DC
  Administered 2012-09-10: 100 ug via ORAL
  Filled 2012-09-09 (×2): qty 1

## 2012-09-09 MED ORDER — SODIUM CHLORIDE 0.9 % IV SOLN
INTRAVENOUS | Status: DC
  Administered 2012-09-09 – 2012-09-10 (×2): via INTRAVENOUS
  Filled 2012-09-09 (×5): qty 1000

## 2012-09-09 MED ORDER — BUPIVACAINE-EPINEPHRINE PF 0.25-1:200000 % IJ SOLN
INTRAMUSCULAR | Status: AC
  Filled 2012-09-09: qty 30

## 2012-09-09 MED ORDER — DOCUSATE SODIUM 100 MG PO CAPS
100.0000 mg | ORAL_CAPSULE | Freq: Two times a day (BID) | ORAL | Status: DC
  Administered 2012-09-09 – 2012-09-10 (×2): 100 mg via ORAL

## 2012-09-09 MED ORDER — CHLORHEXIDINE GLUCONATE 4 % EX LIQD: 60.0000 mL | Freq: Once | CUTANEOUS | Status: DC

## 2012-09-09 MED ORDER — CELECOXIB 200 MG PO CAPS
200.0000 mg | ORAL_CAPSULE | Freq: Two times a day (BID) | ORAL | Status: DC
  Administered 2012-09-09 – 2012-09-10 (×2): 200 mg via ORAL
  Filled 2012-09-09 (×4): qty 1

## 2012-09-09 MED ORDER — HYDROCODONE-ACETAMINOPHEN 7.5-325 MG PO TABS
1.0000 | ORAL_TABLET | ORAL | Status: DC
  Administered 2012-09-09 – 2012-09-10 (×4): 2 via ORAL
  Filled 2012-09-09 (×4): qty 2

## 2012-09-09 MED ORDER — LACTATED RINGERS IV SOLN: INTRAVENOUS | Status: DC

## 2012-09-09 MED ORDER — SODIUM CHLORIDE 0.9 % IV SOLN
1340.0000 mg | Freq: Once | INTRAVENOUS | Status: AC
  Administered 2012-09-09: 1340 mg via INTRAVENOUS
  Filled 2012-09-09: qty 13.4

## 2012-09-09 MED ORDER — METHOCARBAMOL 100 MG/ML IJ SOLN
500.0000 mg | Freq: Four times a day (QID) | INTRAVENOUS | Status: DC | PRN
  Administered 2012-09-09 (×2): 500 mg via INTRAVENOUS
  Filled 2012-09-09 (×2): qty 5

## 2012-09-09 MED ORDER — BISACODYL 10 MG RE SUPP: 10.0000 mg | Freq: Every day | RECTAL | Status: DC | PRN

## 2012-09-09 MED ORDER — FENTANYL CITRATE 0.05 MG/ML IJ SOLN
INTRAMUSCULAR | Status: DC | PRN
  Administered 2012-09-09 (×4): 25 ug via INTRAVENOUS

## 2012-09-09 MED ORDER — CEFAZOLIN SODIUM-DEXTROSE 2-3 GM-% IV SOLR
2.0000 g | Freq: Four times a day (QID) | INTRAVENOUS | Status: AC
  Administered 2012-09-09 (×2): 2 g via INTRAVENOUS
  Filled 2012-09-09 (×2): qty 50

## 2012-09-09 MED ORDER — HYDROMORPHONE HCL PF 1 MG/ML IJ SOLN
0.5000 mg | INTRAMUSCULAR | Status: DC | PRN
  Administered 2012-09-10: 0.5 mg via INTRAVENOUS
  Filled 2012-09-09: qty 1

## 2012-09-09 MED ORDER — ACETAMINOPHEN 10 MG/ML IV SOLN
INTRAVENOUS | Status: AC
  Filled 2012-09-09: qty 100

## 2012-09-09 MED ORDER — 0.9 % SODIUM CHLORIDE (POUR BTL) OPTIME
TOPICAL | Status: DC | PRN
  Administered 2012-09-09: 1000 mL

## 2012-09-09 MED ORDER — LACTATED RINGERS IV SOLN
INTRAVENOUS | Status: DC | PRN
  Administered 2012-09-09 (×2): via INTRAVENOUS

## 2012-09-09 MED ORDER — PROMETHAZINE HCL 25 MG/ML IJ SOLN: 6.2500 mg | INTRAMUSCULAR | Status: DC | PRN

## 2012-09-09 MED ORDER — FERROUS SULFATE 325 (65 FE) MG PO TABS
325.0000 mg | ORAL_TABLET | Freq: Three times a day (TID) | ORAL | Status: DC
  Administered 2012-09-09 – 2012-09-10 (×3): 325 mg via ORAL
  Filled 2012-09-09 (×6): qty 1

## 2012-09-09 MED ORDER — MENTHOL 3 MG MT LOZG: 1.0000 | LOZENGE | OROMUCOSAL | Status: DC | PRN

## 2012-09-09 MED ORDER — HYDROMORPHONE HCL PF 1 MG/ML IJ SOLN
INTRAMUSCULAR | Status: DC | PRN
  Administered 2012-09-09: 1 mg via INTRAVENOUS
  Administered 2012-09-09 (×2): 0.5 mg via INTRAVENOUS

## 2012-09-09 MED ORDER — LIDOCAINE HCL (CARDIAC) 20 MG/ML IV SOLN
INTRAVENOUS | Status: DC | PRN
  Administered 2012-09-09: 50 mg via INTRAVENOUS

## 2012-09-09 SURGICAL SUPPLY — 54 items
BAG ZIPLOCK 12X15 (MISCELLANEOUS) ×2 IMPLANT
BANDAGE ELASTIC 6 VELCRO ST LF (GAUZE/BANDAGES/DRESSINGS) ×2 IMPLANT
BANDAGE ESMARK 6X9 LF (GAUZE/BANDAGES/DRESSINGS) ×1 IMPLANT
BLADE SAW RECIPROCATING 77.5 (BLADE) ×2 IMPLANT
BLADE SAW SGTL 13.0X1.19X90.0M (BLADE) ×2 IMPLANT
BNDG ESMARK 6X9 LF (GAUZE/BANDAGES/DRESSINGS) ×2
BOWL SMART MIX CTS (DISPOSABLE) ×2 IMPLANT
CEMENT HV SMART SET (Cement) ×2 IMPLANT
CLOTH BEACON ORANGE TIMEOUT ST (SAFETY) ×2 IMPLANT
COVER SURGICAL LIGHT HANDLE (MISCELLANEOUS) ×2 IMPLANT
CUFF TOURN SGL QUICK 34 (TOURNIQUET CUFF) ×1
CUFF TRNQT CYL 34X4X40X1 (TOURNIQUET CUFF) ×1 IMPLANT
DERMABOND ADVANCED (GAUZE/BANDAGES/DRESSINGS) ×1
DERMABOND ADVANCED .7 DNX12 (GAUZE/BANDAGES/DRESSINGS) ×1 IMPLANT
DRAPE EXTREMITY T 121X128X90 (DRAPE) ×2 IMPLANT
DRAPE POUCH INSTRU U-SHP 10X18 (DRAPES) ×2 IMPLANT
DRSG AQUACEL AG ADV 3.5X 6 (GAUZE/BANDAGES/DRESSINGS) ×2 IMPLANT
DRSG TEGADERM 4X4.75 (GAUZE/BANDAGES/DRESSINGS) ×2 IMPLANT
DURAPREP 26ML APPLICATOR (WOUND CARE) ×2 IMPLANT
ELECT REM PT RETURN 9FT ADLT (ELECTROSURGICAL) ×2
ELECTRODE REM PT RTRN 9FT ADLT (ELECTROSURGICAL) ×1 IMPLANT
EVACUATOR 1/8 PVC DRAIN (DRAIN) ×2 IMPLANT
FACESHIELD LNG OPTICON STERILE (SAFETY) ×8 IMPLANT
GAUZE SPONGE 2X2 8PLY STRL LF (GAUZE/BANDAGES/DRESSINGS) ×1 IMPLANT
GLOVE BIOGEL PI IND STRL 6.5 (GLOVE) ×2 IMPLANT
GLOVE BIOGEL PI IND STRL 7.0 (GLOVE) ×1 IMPLANT
GLOVE BIOGEL PI IND STRL 7.5 (GLOVE) ×1 IMPLANT
GLOVE BIOGEL PI IND STRL 8 (GLOVE) IMPLANT
GLOVE BIOGEL PI INDICATOR 6.5 (GLOVE) ×2
GLOVE BIOGEL PI INDICATOR 7.0 (GLOVE) ×1
GLOVE BIOGEL PI INDICATOR 7.5 (GLOVE) ×1
GLOVE BIOGEL PI INDICATOR 8 (GLOVE)
GLOVE ORTHO TXT STRL SZ7.5 (GLOVE) ×4 IMPLANT
GOWN BRE IMP PREV XXLGXLNG (GOWN DISPOSABLE) ×4 IMPLANT
GOWN STRL NON-REIN LRG LVL3 (GOWN DISPOSABLE) ×2 IMPLANT
IMMOBILIZER KNEE 20 (SOFTGOODS) ×2
IMMOBILIZER KNEE 20 THIGH 36 (SOFTGOODS) ×1 IMPLANT
KIT BASIN OR (CUSTOM PROCEDURE TRAY) ×2 IMPLANT
LEGGING LITHOTOMY PAIR STRL (DRAPES) ×2 IMPLANT
MANIFOLD NEPTUNE II (INSTRUMENTS) ×2 IMPLANT
NDL SAFETY ECLIPSE 18X1.5 (NEEDLE) ×1 IMPLANT
NEEDLE HYPO 18GX1.5 SHARP (NEEDLE) ×1
PACK TOTAL JOINT (CUSTOM PROCEDURE TRAY) ×2 IMPLANT
POSITIONER SURGICAL ARM (MISCELLANEOUS) ×2 IMPLANT
SPONGE GAUZE 2X2 STER 10/PKG (GAUZE/BANDAGES/DRESSINGS) ×1
SUCTION FRAZIER TIP 10 FR DISP (SUCTIONS) ×2 IMPLANT
SUT MNCRL AB 4-0 PS2 18 (SUTURE) ×2 IMPLANT
SUT VIC AB 1 CT1 36 (SUTURE) ×2 IMPLANT
SUT VIC AB 2-0 CT1 27 (SUTURE) ×2
SUT VIC AB 2-0 CT1 TAPERPNT 27 (SUTURE) ×2 IMPLANT
SUT VLOC 180 0 24IN GS25 (SUTURE) ×2 IMPLANT
SYR 50ML LL SCALE MARK (SYRINGE) ×2 IMPLANT
TOWEL OR 17X26 10 PK STRL BLUE (TOWEL DISPOSABLE) ×4 IMPLANT
TRAY FOLEY CATH 14FRSI W/METER (CATHETERS) ×2 IMPLANT

## 2012-09-09 NOTE — Anesthesia Preprocedure Evaluation (Addendum)
Anesthesia Evaluation  Patient identified by MRN, date of birth, ID band Patient awake    Reviewed: Allergy & Precautions, H&P , NPO status , Patient's Chart, lab work & pertinent test results  Airway Mallampati: II TM Distance: >3 FB Neck ROM: Full    Dental No notable dental hx. (+) Edentulous Upper and Edentulous Lower   Pulmonary neg pulmonary ROS,  breath sounds clear to auscultation  Pulmonary exam normal       Cardiovascular negative cardio ROS  Rhythm:Regular Rate:Normal     Neuro/Psych negative neurological ROS  negative psych ROS   GI/Hepatic negative GI ROS, Neg liver ROS,   Endo/Other  negative endocrine ROSHypothyroidism   Renal/GU negative Renal ROS  negative genitourinary   Musculoskeletal negative musculoskeletal ROS (+)   Abdominal   Peds negative pediatric ROS (+)  Hematology negative hematology ROS (+)   Anesthesia Other Findings   Reproductive/Obstetrics negative OB ROS                          Anesthesia Physical Anesthesia Plan  ASA: II  Anesthesia Plan: Spinal   Post-op Pain Management:    Induction: Intravenous  Airway Management Planned:   Additional Equipment:   Intra-op Plan:   Post-operative Plan:   Informed Consent: I have reviewed the patients History and Physical, chart, labs and discussed the procedure including the risks, benefits and alternatives for the proposed anesthesia with the patient or authorized representative who has indicated his/her understanding and acceptance.   Dental advisory given  Plan Discussed with: CRNA  Anesthesia Plan Comments:         Anesthesia Quick Evaluation

## 2012-09-09 NOTE — Transfer of Care (Signed)
Immediate Anesthesia Transfer of Care Note  Patient: Nichole Cross  Procedure(s) Performed: Procedure(s) (LRB): UNICOMPARTMENTAL KNEE (Left)  Patient Location: PACU  Anesthesia Type: General  Level of Consciousness: sedated, patient cooperative and responds to stimulaton  Airway & Oxygen Therapy: Patient Spontanous Breathing and Patient connected to face mask oxgen  Post-op Assessment: Report given to PACU RN and Post -op Vital signs reviewed and stable  Post vital signs: Reviewed and stable  Complications: No apparent anesthesia complications Pt unable to lift lower ext. With slight movement of toes on bilat ext. Denied pain.

## 2012-09-09 NOTE — Evaluation (Signed)
Physical Therapy Evaluation Patient Details Name: Nichole Cross MRN: [id number] DOB: 11/06/1941 Today's Date: 09/09/2012 Time: 7829-5621 PT Time Calculation (min): 17 min  PT Assessment / Plan / Recommendation Clinical Impression  Pt s/p L unicompartmental knee.  Pt would benefit from acute PT services in order to improve independence with transfers, ambulation and stairs prior to d/c home with daughters to assist.    PT Assessment  Patient needs continued PT services    Follow Up Recommendations  Home health PT    Does the patient have the potential to tolerate intense rehabilitation      Barriers to Discharge        Equipment Recommendations  Rolling walker with 5" wheels;3 in 1 bedside comode    Recommendations for Other Services     Frequency 7X/week    Precautions / Restrictions Precautions Precautions: Knee Required Braces or Orthoses: Knee Immobilizer - Left Knee Immobilizer - Left: Discontinue once straight leg raise with < 10 degree lag Restrictions LLE Weight Bearing: Weight bearing as tolerated   Pertinent Vitals/Pain 5.5/10 L knee pain, repositioned, RN notified, ice applied      Mobility  Bed Mobility Bed Mobility: Supine to Sit Supine to Sit: 4: Min assist Details for Bed Mobility Assistance: verbal cues for technique, assist for L LE Transfers Transfers: Stand to Sit;Sit to Stand Sit to Stand: 4: Min assist;With upper extremity assist;From bed Stand to Sit: 4: Min assist;With upper extremity assist;To chair/3-in-1 Details for Transfer Assistance: verbal cues for safe technique, assist to rise and control descent Ambulation/Gait Ambulation/Gait Assistance: 4: Min guard Ambulation Distance (Feet): 80 Feet Assistive device: Rolling walker Ambulation/Gait Assistance Details: verbal cues for sequence, step length, RW distance Gait Pattern: Step-to pattern;Decreased stance time - left;Antalgic Gait velocity: decreased    Shoulder Instructions     Exercises     PT Diagnosis: Difficulty walking;Acute pain  PT Problem List: Decreased strength;Decreased activity tolerance;Decreased range of motion;Decreased mobility;Decreased knowledge of use of DME;Decreased knowledge of precautions;Pain PT Treatment Interventions: DME instruction;Gait training;Stair training;Functional mobility training;Therapeutic activities;Therapeutic exercise;Patient/family education   PT Goals Acute Rehab PT Goals PT Goal Formulation: With patient Time For Goal Achievement: 09/16/12 Potential to Achieve Goals: Good Pt will go Supine/Side to Sit: with modified independence PT Goal: Supine/Side to Sit - Progress: Goal set today Pt will go Sit to Supine/Side: with modified independence PT Goal: Sit to Supine/Side - Progress: Goal set today Pt will go Sit to Stand: with modified independence PT Goal: Sit to Stand - Progress: Goal set today Pt will go Stand to Sit: with modified independence PT Goal: Stand to Sit - Progress: Goal set today Pt will Ambulate: 51 - 150 feet;with modified independence;with least restrictive assistive device PT Goal: Ambulate - Progress: Goal set today Pt will Go Up / Down Stairs: 1-2 stairs;with least restrictive assistive device;with supervision PT Goal: Up/Down Stairs - Progress: Goal set today Pt will Perform Home Exercise Program: with supervision, verbal cues required/provided PT Goal: Perform Home Exercise Program - Progress: Goal set today  Visit Information  Last PT Received On: 09/09/12 Assistance Needed: +1    Subjective Data  Subjective: I was feeling nauseous 30 min ago but my nurse gave me Zofran.   Prior Functioning  Home Living Lives With: Alone Available Help at Discharge: Family (2 daughters will be assisting) Type of Home: House Home Access: Stairs to enter [employer] of Steps: 2 Entrance Stairs-Rails: Right Home Layout: One level Prior Function Level of Independence:  Independent Communication  Communication: No difficulties    Cognition  Overall Cognitive Status: Appears within functional limits for tasks assessed/performed Arousal/Alertness: Awake/alert Orientation Level: Appears intact for tasks assessed Behavior During Session: [facility] for tasks performed    Extremity/Trunk Assessment Right Upper Extremity Assessment RUE ROM/Strength/Tone: [facility] for tasks assessed Left Upper Extremity Assessment LUE ROM/Strength/Tone: WFL for tasks assessed Right Lower Extremity Assessment RLE ROM/Strength/Tone: [facility] for tasks assessed Left Lower Extremity Assessment LLE ROM/Strength/Tone: Deficits LLE ROM/Strength/Tone Deficits: assist to perform SLR, maintained KI, ROM TBA   Balance    End of Session PT - End of Session Equipment Utilized During Treatment: Left knee immobilizer Activity Tolerance: Patient limited by fatigue;Patient limited by pain Patient left: in chair;with call bell/phone within reach;with family/visitor present Nurse Communication: Patient requests pain meds  GP     Nichole Cross,KATHrine E 09/09/2012, 4:12 PM Pager: [phone]

## 2012-09-09 NOTE — Interval H&P Note (Signed)
History and Physical Interval Note:  09/09/2012 7:56 AM  Nichole Cross  has presented today for surgery, with the diagnosis of Left Knee Medial Compartmental Osteoarthritis  The various methods of treatment have been discussed with the patient and family. After consideration of risks, benefits and other options for treatment, the patient has consented to  Procedure(s) (LRB) with comments: UNICOMPARTMENTAL KNEE (Left) as a surgical intervention .  The patient's history has been reviewed, patient examined, no change in status, stable for surgery.  I have reviewed the patient's chart and labs.  Questions were answered to the patient's satisfaction.     Shelda Pal

## 2012-09-09 NOTE — Progress Notes (Signed)
Utilization review completed.  

## 2012-09-09 NOTE — Anesthesia Procedure Notes (Addendum)
Spinal  Patient location during procedure: OR Start time: 09/09/2012 8:05 AM End time: 09/09/2012 8:15 AM Staffing Anesthesiologist: Phillips Grout Performed by: anesthesiologist  Preanesthetic Checklist Completed: patient identified, site marked, surgical consent, pre-op evaluation, timeout performed, IV checked, risks and benefits discussed and monitors and equipment checked Spinal Block Patient position: sitting Prep: Betadine Patient monitoring: heart rate, continuous pulse ox and blood pressure Location: L2-3 Injection technique: single-shot Needle Needle type: Spinocan  Needle gauge: 22 G Needle length: 9 cm Assessment Sensory level: T4 Additional Notes Expiration date of kit checked and confirmed. Patient tolerated procedure well, without complications. First attempt L2-L3 per [facility] CRNA without success. 2nd attempt per MD. Acey Lav with success as charted. Clear CSF return no hem.

## 2012-09-09 NOTE — Anesthesia Postprocedure Evaluation (Signed)
  Anesthesia Post-op Note  Patient: Nichole Cross  Procedure(s) Performed: Procedure(s) (LRB): UNICOMPARTMENTAL KNEE (Left)  Patient Location: PACU  Anesthesia Type: Spinal  Level of Consciousness: awake and alert   Airway and Oxygen Therapy: Patient Spontanous Breathing  Post-op Pain: mild  Post-op Assessment: Post-op Vital signs reviewed, Patient's Cardiovascular Status Stable, Respiratory Function Stable, Patent Airway and No signs of Nausea or vomiting  Post-op Vital Signs: stable  Complications: No apparent anesthesia complications

## 2012-09-09 NOTE — Op Note (Signed)
NAME: Nichole Cross    MEDICAL RECORD NO.: [id number]   FACILITY: [facility]   DATE OF BIRTH: 08-22-1941  PHYSICIAN: Madlyn Frankel. Charlann Boxer, M.D.    DATE OF PROCEDURE: 09/09/2012    OPERATIVE REPORT   PREOPERATIVE DIAGNOSIS: Left knee medial compartment osteoarthritis.   POSTOPERATIVE DIAGNOSIS: Left knee medial compartment osteoarthritis.  PROCEDURE: Left partial knee replacement utilizing Biomet Oxford knee  component, size small femur, a left medial size A tibial tray with a size 5 insert.   SURGEON: Madlyn Frankel. Charlann Boxer, M.D.   ASSISTANT: Lanney Gins, PAC.  Please note that Mr. Nichole Cross was present for the entirety of the case,  utilized for preoperative positioning, perioperative retractor  management, general facilitation of the case and primary wound closure.   ANESTHESIA: Spinal.   SPECIMENS: None.   COMPLICATIONS: None.  DRAINS: 1 medium HV   TOURNIQUET TIME: 38 minutes at 250 mmHg.   INDICATIONS FOR PROCEDURE: The patient is a 52 patient of mine who presented for evaluation of left knee pain.  They presented with primary complaints of pain on the medial side of their knee. Radiographs revealed advanced medial compartment arthritis with specifically an antero-medial wear pattern.  There was bone on bone changes noted with subchondral sclerosis and osteophytes present. The patient has had progressive problems failing to respond to conservative measures of medications, injections and activity modification. Risks of infection, DVT, component failure, need for future revision surgery were all discussed and reviewed.  Consent was obtained for benefit of pain relief.   PROCEDURE IN DETAIL: The patient was brought to the operative theater.  Once adequate anesthesia, preoperative antibiotics, 2gm Ancef administered, the patient was positioned in supine position with a left thigh tourniquet  placed. The left lower extremity was prepped and draped in sterile  fashion with the leg on the  Oxford leg holder.  The leg was allowed to flex to 120 degrees. A time-out  was performed identifying the patient, planned procedure, and extremity.  The leg was exsanguinated, tourniquet elevated to 250 mmHg. A midline  incision was made from the proximal pole of the patella to the tibial tubercle. A  soft tissue plane was created and partial median arthrotomy was then  made to allow for subluxation of the patella. Following initial synovectomy and  debridement, the osteophytes were removed off the medial aspect of the  knee.   Attention was first directed to the tibia. The tibial  extramedullary guide was positioned over the anterior crest of the tibia  and pinned into position, and using a measured resection guide from the  Oxford system, a 4 mm resection was made off the proximal tibia. First  the reciprocating saw along the medial aspect of the tibial spines, then the oscillating saw.    At this point, I sized this cut surface seem to be best fit for a size A tibial tray.  With the retractors out of the wound and the knee held at 90 degrees the size 4 feeler gauge had appropriate tension on the medial ligament.   At this point, the femoral canal was opened with a drill and the  intramedullary rod passed. Then using the guide for a size small resection off  the posterior aspect of the femur was positioned over the mid portion of the medial femoral condyle.  The orientation was set using the guide that mates the femoral guide to the intramedullary rod.  The 2 drill holes were made into the distal femur.  The posterior guide  was then impacted into place and the posterior  femoral cut made.  At this point, I milled the distal femur with a size 4 spigot in place. At this point, we did a trial reduction of the small femur, size A tibial tray and a size 4 insert. At 90 degrees of  flexion and at 20 degrees of flexion the knee had symmetric tension on  the ligaments.   Given these findings,  the trial femoral component was removed. Final preparation of tibia was carried out by pinning it in position. Then  using a reciprocating saw I removed bone for the keel. Further bone was  removed with an osteotome.  Trial reduction was now carried out with the small femur, the size A tibia, and a 5 lollipop insert. The balance of the  ligaments appeared to be symmetric at 20 degrees and 90 degrees. Given  all these findings, the trial components were removed.   Cement was mixed. The final components were opened. The knee was irrigated with  normal saline solution. Then final debridements of the  soft tissue was carried out, I also drilled the sclerotic bone with a drill.  The final components were cemented with a single batch of cement in a  two-stage technique with the tibial component cemented first. The knee  was then brought  to 45 degrees of flexion with a 5 feeler gauge, held with pressure for a minute and half.  After this the femoral component was cemented in place.  The knee was again held at 45 degrees of flexion while the cement fully cured.  Excess cement was removed throughout the knee. Tourniquet was let down  after 38 minutes. After the cement had fully cured and excessive cement  was removed throughout the knee there was no visualized cement present.   The final size 5 insert was chosen and snapped into position. We re-irrigated  the knee. I placed a medium Hemovac drain deep. The extensor mechanism  was then reapproximated using a #1 Vicryl with the knee in flexion. The  remaining wound was closed with 2-0 Vicryl and a running 4-0 Monocryl.  The knee was cleaned, dried, and dressed sterilely using Dermabond and  Aquacel dressing. The drain site was dressed separately. The patient  was brought to the recovery room, Ace wrap in place, tolerating the  procedure well. He will be in the hospital for overnight observation.  We will initiate physical therapy and progress to  ambulate.     Madlyn Frankel Charlann Boxer, M.D.

## 2012-09-10 ENCOUNTER — Encounter (HOSPITAL_COMMUNITY): Payer: Self-pay | Admitting: Orthopedic Surgery

## 2012-09-10 DIAGNOSIS — E871 Hypo-osmolality and hyponatremia: Secondary | ICD-10-CM

## 2012-09-10 DIAGNOSIS — E663 Overweight: Secondary | ICD-10-CM

## 2012-09-10 DIAGNOSIS — D500 Iron deficiency anemia secondary to blood loss (chronic): Secondary | ICD-10-CM

## 2012-09-10 LAB — CBC
Hemoglobin: 11.8 g/dL — ABNORMAL LOW (ref 12.0–15.0)
MCV: 76 fL — ABNORMAL LOW (ref 78.0–100.0)
Platelets: 234 10*3/uL (ref 150–400)
RBC: 4.67 MIL/uL (ref 3.87–5.11)
WBC: 10.7 10*3/uL — ABNORMAL HIGH (ref 4.0–10.5)

## 2012-09-10 LAB — BASIC METABOLIC PANEL
CO2: 23 mEq/L (ref 19–32)
Chloride: 97 mEq/L (ref 96–112)
Creatinine, Ser: 0.77 mg/dL (ref 0.50–1.10)
Glucose, Bld: 125 mg/dL — ABNORMAL HIGH (ref 70–99)
Sodium: 129 mEq/L — ABNORMAL LOW (ref 135–145)

## 2012-09-10 MED ORDER — ASPIRIN EC 325 MG PO TBEC: 325.0000 mg | DELAYED_RELEASE_TABLET | Freq: Two times a day (BID) | ORAL | Status: DC

## 2012-09-10 MED ORDER — DIPHENHYDRAMINE HCL 25 MG PO CAPS: 25.0000 mg | ORAL_CAPSULE | Freq: Four times a day (QID) | ORAL | Status: DC | PRN

## 2012-09-10 MED ORDER — DSS 100 MG PO CAPS: 100.0000 mg | ORAL_CAPSULE | Freq: Two times a day (BID) | ORAL | Status: DC

## 2012-09-10 MED ORDER — POLYETHYLENE GLYCOL 3350 17 G PO PACK: 17.0000 g | PACK | Freq: Two times a day (BID) | ORAL | Status: DC

## 2012-09-10 MED ORDER — FERROUS SULFATE 325 (65 FE) MG PO TABS: 325.0000 mg | ORAL_TABLET | Freq: Three times a day (TID) | ORAL | Status: DC

## 2012-09-10 MED ORDER — METHOCARBAMOL 500 MG PO TABS: 500.0000 mg | ORAL_TABLET | Freq: Four times a day (QID) | ORAL | Status: DC | PRN

## 2012-09-10 MED ORDER — PROMETHAZINE HCL 12.5 MG PO TABS: 12.5000 mg | ORAL_TABLET | Freq: Four times a day (QID) | ORAL | Status: DC | PRN

## 2012-09-10 MED ORDER — HYDROCODONE-ACETAMINOPHEN 7.5-325 MG PO TABS
1.0000 | ORAL_TABLET | ORAL | Status: DC | PRN
Start: 2012-09-10 — End: 2015-08-13

## 2012-09-10 MED ORDER — RIVAROXABAN 10 MG PO TABS: 10.0000 mg | ORAL_TABLET | ORAL | Status: DC

## 2012-09-10 NOTE — Care Management Note (Signed)
    Page 1 of 2   09/10/2012     5:27:07 PM   CARE MANAGEMENT NOTE 09/10/2012  Patient:  LAELLE, BRIDGETT   Account Number:  [id number]  Date Initiated:  09/10/2012  Documentation initiated by:  Colleen Can  Subjective/Objective Assessment:   CM SPOKE WITH PATIENT. PLANS ARE FOR PATIENT TO RETURN TO HER HOME IN [place] WHERE DAUGHTER WILL BE CAREGIVER.she will need 3n1 and RW     Action/Plan:   HOME WITH HH SERVICES FOLLOWING [facility]   Anticipated DC Date:  09/10/2012   Anticipated DC Plan:  HOME W HOME HEALTH SERVICES  In-house referral  NA      DC Planning Services  CM consult      [facility] Choice  HOME HEALTH   Choice offered to / List presented to:  C-1 Patient   DME arranged  3-N-1  WALKER - ROLLING      DME agency  NA     HH arranged  HH-2 PT      HH agency  [facility]   Status of service:  Completed, signed off Medicare Important Message given?  NA - LOS <3 / Initial given by admissions (If response is "NO", the following Medicare IM given date fields will be blank) Date Medicare IM given:   Date Additional Medicare IM given:    Discharge Disposition:  HOME W HOME HEALTH SERVICES  Per UR Regulation:  Reviewed for med. necessity/level of care/duration of stay  If discussed at Long Length of Stay Meetings, dates discussed:    Comments:  09/10/2012 Elige Shouse,bsn rn cccm [phone] search for [facility] agencies [place] out of network and cannot provide hh services [facility], [facility], [place], [place], [place], [employer], [place] [facility], [facility] and [facility] are out of area and can not provide [facility] services [facility] can provide [facility] services and can start tomorrow 09/11/2012. Pt given contact information for advanced home [facility].

## 2012-09-10 NOTE — Progress Notes (Addendum)
Physical Therapy Treatment Patient Details Name: Nichole Cross MRN: [id number] DOB: 07-21-1941 Today's Date: 09/10/2012 Time: 4098-1191 PT Time Calculation (min): 26 min  PT Assessment / Plan / Recommendation Comments on Treatment Session  Pt ambulated and performed exercises.  Pt to practice stairs this afternoon.    Follow Up Recommendations  Home health PT     Does the patient have the potential to tolerate intense rehabilitation     Barriers to Discharge        Equipment Recommendations  Rolling walker with 5" wheels;3 in 1 bedside comode    Recommendations for Other Services    Frequency     Plan Discharge plan remains appropriate;Frequency remains appropriate    Precautions / Restrictions Precautions Precautions: Knee Precaution Comments: able to perform SLR without lag Required Braces or Orthoses: Knee Immobilizer - Left Knee Immobilizer - Left: Discontinue once straight leg raise with < 10 degree lag Restrictions Weight Bearing Restrictions: No LLE Weight Bearing: Weight bearing as tolerated   Pertinent Vitals/Pain No c/o pain at rest, repositioned, ice applied    Mobility  Bed Mobility Bed Mobility: Sit to Supine Supine to Sit: 5: Supervision;HOB elevated Sit to Supine: 5: Supervision Transfers Transfers: Stand to Sit;Sit to Stand Sit to Stand: 4: Min guard;With upper extremity assist;From chair/3-in-1 Stand to Sit: 4: Min guard;With upper extremity assist;To bed Details for Transfer Assistance: able to remember cues from working with OT earlier Ambulation/Gait Ambulation/Gait Assistance: 4: Min guard Ambulation Distance (Feet): 160 Feet Assistive device: Rolling walker Ambulation/Gait Assistance Details: verbal cues for step length and posture Gait Pattern: Step-to pattern;Decreased stance time - left;Antalgic Gait velocity: decreased    Exercises Total Joint Exercises Ankle Circles/Pumps: AROM;Both;20 reps Quad Sets: AROM;Both;15 reps Short Arc  Quad: AAROM;15 reps;Left Heel Slides: AAROM;Left;15 reps Hip ABduction/ADduction: AAROM;Left;15 reps Straight Leg Raises: AROM;10 reps;Left;Strengthening Goniometric ROM: L knee AAROM -3-75*   PT Diagnosis:    PT Problem List:   PT Treatment Interventions:     PT Goals Acute Rehab PT Goals PT Goal: Sit to Supine/Side - Progress: Progressing toward goal PT Goal: Sit to Stand - Progress: Progressing toward goal PT Goal: Stand to Sit - Progress: Progressing toward goal PT Goal: Ambulate - Progress: Progressing toward goal PT Goal: Perform Home Exercise Program - Progress: Progressing toward goal  Visit Information  Last PT Received On: 09/10/12 Assistance Needed: +1    Subjective Data  Subjective: I didn't sleep well last night.   Cognition  Overall Cognitive Status: Appears within functional limits for tasks assessed/performed Arousal/Alertness: Awake/alert Orientation Level: Appears intact for tasks assessed Behavior During Session: [facility] for tasks performed    Balance  Balance Balance Assessed: Yes Dynamic Standing Balance Dynamic Standing - Level of Assistance: 5: Stand by assistance  End of Session PT - End of Session Activity Tolerance: Patient tolerated treatment well Patient left: in bed;with call bell/phone within reach;with family/visitor present   GP     Nichole Cross,KATHrine E 09/10/2012, 1:33 PM Pager: [phone]

## 2012-09-10 NOTE — Progress Notes (Signed)
   Subjective: 1 Day Post-Op Procedure(s) (LRB): UNICOMPARTMENTAL KNEE (Left)   Patient reports pain as mild, pain well controlled. Occasional muscle spasms, otherwise she's doing well. No events throughout the night. Ready to be discharged home if she does well with PT.  Objective:   VITALS:   Filed Vitals:   09/10/12 0531  BP: 150/79  Pulse: 82  Temp: 98.4 F (36.9 C)  Resp: 20    Neurovascular intact Dorsiflexion/Plantar flexion intact Incision: dressing C/D/I No cellulitis present Compartment soft  LABS  Basename 09/10/12 0433 09/08/12 1115  HGB 11.8* 13.7  HCT 35.5* 41.4  WBC 10.7* 6.8  PLT 234 265     Basename 09/10/12 0433 09/08/12 1115  NA 129* 139  K 3.6 4.9  BUN 8 11  CREATININE 0.77 0.93  GLUCOSE 125* 100*     Assessment/Plan: 1 Day Post-Op Procedure(s) (LRB): UNICOMPARTMENTAL KNEE (Left) Foley cath d/c'ed HV drain d/c'ed Advance diet Up with therapy D/C IV fluids Discharge home with home health Follow up in 2 weeks at [facility]. Follow-up Information    Follow up with OLIN,Naquan Garman D in 2 weeks.   Contact information:   [facility] [address], [address] [place] [address] [phone]          Expected ABLA  Treated with iron and will observe  Overweight (BMI 25-29.9)  Estimated Body mass index is 27.87 kg/(m^2) as calculated from the following:   Height as of this encounter: 5' 10.5"(1.791 m).   Weight as of this encounter: 197 lb(89.359 kg). Patient also counseled that weight may inhibit the healing process Patient counseled that losing weight will help with future health issues  Hyponatremia Treat with IV fluids today and will observe     Nichole Cross. [name]   PAC  09/10/2012, 8:54 AM

## 2012-09-10 NOTE — Progress Notes (Signed)
Physical Therapy Treatment Note   09/10/12 1400  PT Visit Information  Last PT Received On 09/10/12  Assistance Needed +1  PT Time Calculation  PT Start Time 1357  PT Stop Time 1413  PT Time Calculation (min) 16 min  Subjective Data  Subjective I need to use the bathroom first.  Precautions  Precautions Knee  Restrictions  LLE Weight Bearing WBAT  Cognition  Overall Cognitive Status Appears within functional limits for tasks assessed/performed  Bed Mobility  Bed Mobility Sit to Supine  Supine to Sit 5: Supervision;HOB elevated  Transfers  Transfers Stand to Sit;Sit to Stand  Sit to Stand 5: Supervision;With upper extremity assist;From chair/3-in-1;From bed  Stand to Sit 5: Supervision;To chair/3-in-1;To bed;With upper extremity assist  Details for Transfer Assistance one cue for L LE forward upon sitting  Ambulation/Gait  Ambulation/Gait Assistance 5: Supervision  Ambulation Distance (Feet) 80 Feet  Assistive device Rolling walker  Ambulation/Gait Assistance Details doing well with ambulation with RW  Gait Pattern Step-to pattern;Decreased stance time - left;Antalgic  Gait velocity decreased  Stairs Yes  Stairs Assistance 4: Min guard  Stairs Assistance Details (indicate cue type and reason) verbal cues for sequence and technique, pt and daughter educated  Stair Management Technique Step to pattern;Backwards;With walker  Number of Stairs 2   PT - End of Session  Activity Tolerance Patient tolerated treatment well  Patient left in bed;with call bell/phone within reach;with family/visitor present  PT - Assessment/Plan  Comments on Treatment Session Pt ambulated again this afternoon and pt and daughter educated in stair technique.  Pt and daughter had no further questions/concerns regarding d/c home today.  PT Plan Discharge plan remains appropriate;Frequency remains appropriate  Follow Up Recommendations Home health PT  Equipment Recommended Rolling walker with 5" wheels;3  in 1 bedside comode  Acute Rehab PT Goals  PT Goal: Supine/Side to Sit - Progress Progressing toward goal  PT Goal: Sit to Supine/Side - Progress Progressing toward goal  PT Goal: Sit to Stand - Progress Progressing toward goal  PT Goal: Stand to Sit - Progress Progressing toward goal  PT Goal: Ambulate - Progress Progressing toward goal  PT Goal: Up/Down Stairs - Progress Progressing toward goal  PT General Charges  $$ ACUTE PT VISIT 1 Procedure  PT Treatments  $Gait Training 8-22 mins    Zenovia Jarred, PT Pager: [phone]

## 2012-09-10 NOTE — Discharge Summary (Signed)
Physician Discharge Summary  Patient ID: Nichole Cross MRN: [id number] DOB/AGE: 69/03/1941 69 y.o.  Admit date: 09/09/2012 Discharge date: 09/10/2012  Procedures:  Procedure(s) (LRB): UNICOMPARTMENTAL KNEE (Left)  Attending Physician:  Dr. Durene Romans   Admission Diagnoses:   Left knee OA / pain  Discharge Diagnoses:  Principal Problem:  *S/P left UKR Active Problems:  Expected blood loss anemia  Overweight (BMI 25.0-29.9)  Hyponatremia Migraines  Thyroid disease  Osteoporosis  OA  HPI:   Nichole Cross, 71 y.o. female, has a history of pain and functional disability in the left knee due to arthritis and has failed non-surgical conservative treatments for greater than 12 weeks to includeNSAID's and/or analgesics, corticosteriod injections and activity modification. Onset of symptoms was gradual, starting 2 years ago with rapidlly worsening course since that time. The patient noted no past surgery on the left knee(s). Patient currently rates pain in the left knee(s) at 8 out of 10 with activity. Patient has worsening of pain with activity and weight bearing and crepitus. Patient has evidence of periarticular osteophytes and joint space narrowing by imaging studies. Risks, benefits and expectations were discussed with the patient. Patient understand the risks, benefits and expectations and wishes to proceed with surgery.  PCP: Mia Creek, MD   Discharged Condition: good  Hospital Course:  Patient underwent the above stated procedure on 09/09/2012. Patient tolerated the procedure well and brought to the recovery room in good condition and subsequently to the floor.  POD #1 BP: 150/79 ; Pulse: 82 ; Temp: 98.4 F (36.9 C) ; Resp: 20 Pt's foley was removed, as well as the hemovac drain removed. IV was changed to a saline lock. Patient reports pain as mild, pain well controlled. Occasional muscle spasms, otherwise she's doing well. No events throughout the night.  Ready to be discharged home if she does well with PT. Neurovascular intact, dorsiflexion/plantar flexion intact, incision: dressing C/D/I, no cellulitis present and compartment soft.   LABS  Basename  09/10/12 0433  HGB  11.8  HCT  35.5    Discharge Exam: General appearance: alert, cooperative and no distress Extremities: Homans sign is negative, no sign of DVT, no edema, redness or tenderness in the calves or thighs and no ulcers, gangrene or trophic changes  Disposition: Home or Self Care with follow up in 2 weeks   Follow-up Information    Follow up with OLIN,Zairah Arista D in 2 weeks.   Contact information:   [facility] [address], [address] [place] [address] [phone]          Discharge Orders    Future Orders Please Complete By Expires   Diet - low sodium heart healthy      Call MD / Call 911      Comments:   If you experience chest pain or shortness of breath, CALL 911 and be transported to the hospital emergency room.  If you develope a fever above 101 F, pus (white drainage) or increased drainage or redness at the wound, or calf pain, call your surgeon's office.   Discharge instructions      Comments:   Maintain surgical dressing for 10-14 days, then replace with gauze and tape. Keep the area dry and clean until follow up. Follow up in 2 weeks at [facility]. Call with any questions or concerns.   Constipation Prevention      Comments:   Drink plenty of fluids.  Prune juice may be helpful.  You may  use a stool softener, such as Colace (over the counter) 100 mg twice a day.  Use MiraLax (over the counter) for constipation as needed.   Increase activity slowly as tolerated      TED hose      Comments:   Use stockings (TED hose) for 2 weeks on both leg(s).  You may remove them at night for sleeping.   Change dressing      Comments:   Maintain surgical dressing for 10-14 days, then change the dressing daily with  sterile 4 x 4 inch gauze dressing and tape. Keep the area dry and clean.      Discharge Medication List as of 09/10/2012  1:18 PM    START taking these medications   Details  aspirin EC 325 MG tablet Take 1 tablet (325 mg total) by mouth 2 (two) times daily. X 4 weeks, Starting 09/10/2012, Until Discontinued, No Print    diphenhydrAMINE (BENADRYL) 25 mg capsule Take 1 capsule (25 mg total) by mouth every 6 (six) hours as needed for itching, allergies or sleep., Starting 09/10/2012, Until Discontinued, No Print    docusate sodium 100 MG CAPS Take 100 mg by mouth 2 (two) times daily., Starting 09/10/2012, Until Discontinued, No Print    ferrous sulfate 325 (65 FE) MG tablet Take 1 tablet (325 mg total) by mouth 3 (three) times daily after meals., Starting 09/10/2012, Until Discontinued, No Print    HYDROcodone-acetaminophen (NORCO) 7.5-325 MG per tablet Take 1-2 tablets by mouth every 4 (four) hours as needed for pain., Starting 09/10/2012, Until Discontinued, Print    methocarbamol (ROBAXIN) 500 MG tablet Take 1 tablet (500 mg total) by mouth every 6 (six) hours as needed (muscle spasms)., Starting 09/10/2012, Until Discontinued, No Print    polyethylene glycol (MIRALAX / GLYCOLAX) packet Take 17 g by mouth 2 (two) times daily., Starting 09/10/2012, Until Discontinued, No Print    promethazine (PHENERGAN) 12.5 MG tablet Take 1 tablet (12.5 mg total) by mouth every 6 (six) hours as needed for nausea., Starting 09/10/2012, Until Discontinued, Print    rivaroxaban (XARELTO) 10 MG TABS tablet Take 1 tablet (10 mg total) by mouth daily., Starting 09/10/2012, Until Discontinued, Print      CONTINUE these medications which have NOT CHANGED   Details  levothyroxine (SYNTHROID, LEVOTHROID) 100 MCG tablet Take 100 mcg by mouth daily before breakfast., Until Discontinued, Historical Med    alendronate (FOSAMAX) 70 MG tablet Take 70 mg by mouth every 7 (seven) days. Take with a full glass of water  on an empty stomach. Sunday, Until Discontinued, Historical Med      STOP taking these medications     naproxen (NAPROSYN) 500 MG tablet Comments:  Reason for Stopping:       traMADol (ULTRAM) 50 MG tablet Comments:  Reason for Stopping:           Signed: Anastasio Auerbach. Bert Ptacek   PAC  09/10/2012, 3:14 PM

## 2012-09-10 NOTE — Evaluation (Signed)
Occupational Therapy Evaluation Patient Details Name: Nichole Cross MRN: [id number] DOB: 09/08/1941 Today's Date: 09/10/2012 Time: 7829-5621 OT Time Calculation (min): 27 min  OT Assessment / Plan / Recommendation Clinical Impression  Pt is s/p L uniknee replacement and is doing well with ADL. She has assist PRN at discharge by daughter who was present for session. Recommend 3in1. No further OT needs.     OT Assessment  Patient does not need any further OT services    Follow Up Recommendations  No OT follow up;Supervision/Assistance - 24 hour    Barriers to Discharge      Equipment Recommendations  Rolling walker with 5" wheels;3 in 1 bedside comode    Recommendations for Other Services    Frequency       Precautions / Restrictions Precautions Precautions: Knee Required Braces or Orthoses: Knee Immobilizer - Left Knee Immobilizer - Left: Discontinue once straight leg raise with < 10 degree lag Restrictions Weight Bearing Restrictions: No LLE Weight Bearing: Weight bearing as tolerated        ADL  Eating/Feeding: Simulated;Independent Where Assessed - Eating/Feeding: Chair Grooming: Simulated;Set up Where Assessed - Grooming: Supported sitting Upper Body Bathing: Simulated;Chest;Right arm;Abdomen;Left arm;Set up Where Assessed - Upper Body Bathing: Unsupported sitting Lower Body Bathing: Simulated;Min guard Where Assessed - Lower Body Bathing: Supported sit to stand Upper Body Dressing: Simulated;Set up Where Assessed - Upper Body Dressing: Unsupported sitting Lower Body Dressing: Simulated;Min guard Where Assessed - Lower Body Dressing: Supported sit to stand Toilet Transfer: Performed;Min Psychologist, sport and exercise: Raised toilet seat with arms (or 3-in-1 over toilet) Toileting - Clothing Manipulation and Hygiene: Simulated;Min guard Where Assessed - Toileting Clothing Manipulation and Hygiene: Sit to stand from 3-in-1 or toilet Equipment Used: Rolling  walker ADL Comments: Pt able to don L sock already. Pt states she will sponge bathe until able to step over tub. Advised that [facility] can practice tub transfer when she feels ready to try it. Pt doing well and has duaghter to assist PRN at discharge. She was present for education today.    OT Diagnosis:    OT Problem List:   OT Treatment Interventions:     OT Goals    Visit Information  Last OT Received On: 09/10/12 Assistance Needed: +1    Subjective Data  Subjective: I havent been up today yet Patient Stated Goal: home when ready   Prior Functioning     Home Living Lives With: Alone Available Help at Discharge: Family (2 daughters will be assisting) Type of Home: House Home Access: Stairs to enter Secretary/administrator of Steps: 2 Entrance Stairs-Rails: Right Home Layout: One level Bathroom Shower/Tub: Engineer, manufacturing systems: Standard Home Adaptive Equipment: None Prior Function Level of Independence: Independent Communication Communication: No difficulties         Vision/Perception     Cognition  Overall Cognitive Status: Appears within functional limits for tasks assessed/performed Arousal/Alertness: Awake/alert Orientation Level: Appears intact for tasks assessed Behavior During Session: [facility] for tasks performed    Extremity/Trunk Assessment Right Upper Extremity Assessment RUE ROM/Strength/Tone: [facility] for tasks assessed Left Upper Extremity Assessment LUE ROM/Strength/Tone: WFL for tasks assessed     Mobility Bed Mobility Bed Mobility: Supine to Sit Supine to Sit: 5: Supervision;HOB elevated Transfers Transfers: Sit to Stand;Stand to Sit Sit to Stand: 4: Min guard;With upper extremity assist;From bed;From chair/3-in-1 Stand to Sit: 4: Min guard;With upper extremity assist;To chair/3-in-1 Details for Transfer Assistance: min verbal cues for hand placement and safety  Shoulder Instructions     Exercise     Balance Balance Balance  Assessed: Yes Dynamic Standing Balance Dynamic Standing - Level of Assistance: 5: Stand by assistance   End of Session OT - End of Session Activity Tolerance: Patient tolerated treatment well Patient left: in chair;with call bell/phone within reach;with family/visitor present  GO     Lennox Laity [phone] 09/10/2012, 10:57 AM

## 2012-09-10 NOTE — Progress Notes (Signed)
Pt for d/c home today with [facility] PT. Dressing CDI to L knee. Dressing supplies provided for home use. IV d/c'd. Will need 2 sessions of PT prior to D/C per MD. D/C instructions & Rx given with verbalized understanding. Dau at bedside to assist with pt ofr d/c Will d/c pt after 2nd PT session.

## 2012-09-28 ENCOUNTER — Other Ambulatory Visit: Payer: Self-pay | Admitting: Internal Medicine

## 2012-09-28 DIAGNOSIS — Z139 Encounter for screening, unspecified: Secondary | ICD-10-CM

## 2012-09-28 DIAGNOSIS — Z1231 Encounter for screening mammogram for malignant neoplasm of breast: Secondary | ICD-10-CM

## 2012-10-19 ENCOUNTER — Ambulatory Visit (HOSPITAL_COMMUNITY)
Admission: RE | Admit: 2012-10-19 | Discharge: 2012-10-19 | Disposition: A | Discharge status: Home / Self Care | Payer: Medicare Other | Source: Ambulatory Visit | Attending: Internal Medicine | Admitting: Internal Medicine

## 2012-10-19 DIAGNOSIS — Z1231 Encounter for screening mammogram for malignant neoplasm of breast: Secondary | ICD-10-CM

## 2012-10-19 DIAGNOSIS — Z139 Encounter for screening, unspecified: Secondary | ICD-10-CM

## 2013-11-25 ENCOUNTER — Other Ambulatory Visit (HOSPITAL_COMMUNITY): Payer: Self-pay | Admitting: Internal Medicine

## 2013-11-25 DIAGNOSIS — Z1231 Encounter for screening mammogram for malignant neoplasm of breast: Secondary | ICD-10-CM

## 2013-12-02 ENCOUNTER — Ambulatory Visit (HOSPITAL_COMMUNITY)
Admission: RE | Admit: 2013-12-02 | Discharge: 2013-12-02 | Disposition: A | Discharge status: Home / Self Care | Payer: Medicare Other | Source: Ambulatory Visit | Attending: Internal Medicine | Admitting: Internal Medicine

## 2013-12-02 ENCOUNTER — Other Ambulatory Visit (HOSPITAL_COMMUNITY): Payer: Self-pay | Admitting: Internal Medicine

## 2013-12-02 DIAGNOSIS — Z13820 Encounter for screening for osteoporosis: Secondary | ICD-10-CM | POA: Insufficient documentation

## 2013-12-02 DIAGNOSIS — Z1231 Encounter for screening mammogram for malignant neoplasm of breast: Secondary | ICD-10-CM | POA: Insufficient documentation

## 2013-12-02 DIAGNOSIS — Z780 Asymptomatic menopausal state: Secondary | ICD-10-CM | POA: Insufficient documentation

## 2013-12-02 DIAGNOSIS — Z139 Encounter for screening, unspecified: Secondary | ICD-10-CM

## 2014-11-17 DIAGNOSIS — D759 Disease of blood and blood-forming organs, unspecified: Secondary | ICD-10-CM

## 2014-11-17 HISTORY — DX: Disease of blood and blood-forming organs, unspecified: D75.9

## 2015-05-10 ENCOUNTER — Other Ambulatory Visit (HOSPITAL_COMMUNITY): Payer: Self-pay | Admitting: Internal Medicine

## 2015-05-10 DIAGNOSIS — Z1231 Encounter for screening mammogram for malignant neoplasm of breast: Secondary | ICD-10-CM

## 2015-05-18 ENCOUNTER — Ambulatory Visit (HOSPITAL_COMMUNITY)
Admission: RE | Admit: 2015-05-18 | Discharge: 2015-05-18 | Disposition: A | Discharge status: Home / Self Care | Payer: Medicare Other | Source: Ambulatory Visit | Attending: Internal Medicine | Admitting: Internal Medicine

## 2015-05-18 ENCOUNTER — Other Ambulatory Visit (HOSPITAL_COMMUNITY): Payer: Self-pay | Admitting: Internal Medicine

## 2015-05-18 DIAGNOSIS — Z1231 Encounter for screening mammogram for malignant neoplasm of breast: Secondary | ICD-10-CM | POA: Diagnosis present

## 2015-06-18 DIAGNOSIS — C189 Malignant neoplasm of colon, unspecified: Secondary | ICD-10-CM

## 2015-06-18 HISTORY — DX: Malignant neoplasm of colon, unspecified: C18.9

## 2015-07-16 ENCOUNTER — Encounter: Payer: Self-pay | Admitting: General Surgery

## 2015-07-16 ENCOUNTER — Other Ambulatory Visit: Payer: Self-pay | Admitting: General Surgery

## 2015-07-16 NOTE — Progress Notes (Signed)
Nichole Cross 07/16/2015 11:18 AM Location: [facility] Patient #: [id number] DOB: Mar 21, 1941 Single / Language: Cleophus Molt / Race: White Female  History of Present Illness Odis Hollingshead MD; 07/16/2015 12:25 PM) Patient words: colon ca.  The patient is a 74 year old female   Note:She is referred by Dr. Rolm Bookbinder, [facility], because of newly diagnosed adenocarcinoma of the sigmoid colon. She presented with left lower quadrant pain and bloody stools and was noted to be anemic. There was concern for diverticulitis and she was treated with antibiotics. CT scan demonstrated a concentric lesion in the sigmoid colon area with some rounding enlarged lymph nodes. No liver tumors. Colonoscopy demonstrated a lesion 25 cm from the anal verge that the colonoscope could not traverse. Biopsies were consistent with adenocarcinoma. She is referred here for further evaluation and treatment. She has a previous history of a hysterectomy as well as intra-abdominal tumor removal through midline incisions. I have reviewed her records including her CT report as well as colonoscopy report and pathology. Her daughter is here with her.   Other Problems Marjean Donna, CMA; 07/16/2015 11:18 AM) Arthritis Migraine Headache Thyroid Disease  Past Surgical History Marjean Donna, CMA; 07/16/2015 11:18 AM) Hemorrhoidectomy Hysterectomy (not due to cancer) - Complete Knee Surgery Left. Thyroid Surgery  Diagnostic Studies History Marjean Donna, CMA; 07/16/2015 11:18 AM) Colonoscopy within last year Mammogram within last year  Allergies Marjean Donna, [place]; 07/16/2015 11:19 AM) No Known Drug Allergies08/29/2016  Medication History Davy Pique Bynum, CMA; 07/16/2015 11:20 AM) Alendronate Sodium (70MG  Tablet, Oral) Active. Fluticasone Propionate (50MCG/ACT Suspension, Nasal) Active. Levothyroxine Sodium (137MCG Tablet, Oral) Active. Ondansetron HCl (4MG  Tablet, Oral as  needed) Active. ProAir HFA (108 (90 Base)MCG/ACT Aerosol Soln, Inhalation) Active. Omeprazole (40MG  Capsule DR, Oral) Active. Medications Reconciled  Social History Marjean Donna, CMA; 07/16/2015 11:18 AM) Caffeine use Carbonated beverages, Coffee, Tea. No alcohol use No drug use Tobacco use Never smoker.  Family History Marjean Donna, [place]; 07/16/2015 11:18 AM) Arthritis Mother. Breast Cancer Sister. Diabetes Mellitus Mother. Heart Disease Mother. Heart disease in female family member before age 8 Malignant Neoplasm Of Pancreas Mother. Migraine Headache Daughter. Thyroid problems Daughter.  Pregnancy / Birth History Marjean Donna, CMA; 07/16/2015 11:18 AM) Durenda Age 5 Maternal age 45-25 Para 3  Review of Systems ([place]; 07/16/2015 11:18 AM) General Present- Fatigue, Night Sweats and Weight Loss. Not Present- Appetite Loss, Chills, Fever and Weight Gain. Skin Not Present- Change in Wart/Mole, Dryness, Hives, Jaundice, New Lesions, Non-Healing Wounds, Rash and Ulcer. HEENT Present- Seasonal Allergies. Not Present- Earache, Hearing Loss, Hoarseness, Nose Bleed, Oral Ulcers, Ringing in the Ears, Sinus Pain, Sore Throat, Visual Disturbances, Wears glasses/contact lenses and Yellow Eyes. Respiratory Not Present- Bloody sputum, Chronic Cough, Difficulty Breathing, Snoring and Wheezing. Breast Not Present- Breast Mass, Breast Pain, Nipple Discharge and Skin Changes. Cardiovascular Present- Leg Cramps. Not Present- Chest Pain, Difficulty Breathing Lying Down, Palpitations, Rapid Heart Rate, Shortness of Breath and Swelling of Extremities. Gastrointestinal Present- Abdominal Pain, Bloody Stool, Chronic diarrhea, Nausea and Vomiting. Not Present- Bloating, Change in Bowel Habits, Constipation, Difficulty Swallowing, Excessive gas, Gets full quickly at meals, Hemorrhoids, Indigestion and Rectal Pain. Musculoskeletal Present- Joint Stiffness. Not Present- Back Pain, Joint  Pain, Muscle Pain, Muscle Weakness and Swelling of Extremities. Neurological Present- Headaches. Not Present- Decreased Memory, Fainting, Numbness, Seizures, Tingling, Tremor, Trouble walking and Weakness. Psychiatric Not Present- Anxiety, Bipolar, Change in Sleep Pattern, Depression, Fearful and Frequent crying. Hematology Present- Easy Bruising. Not Present- Excessive bleeding, Gland problems,  HIV and Persistent Infections.   Vitals (Sonya Bynum CMA; 07/16/2015 11:19 AM) 07/16/2015 11:18 AM Weight: 173 lb Height: 69in Body Surface Area: 1.95 m Body Mass Index: 25.55 kg/m Temp.: 75F(Temporal)  Pulse: 77 (Regular)  BP: 128/76 (Sitting, Left Arm, Standard)    Physical Exam Odis Hollingshead MD; 07/16/2015 12:26 PM) The physical exam findings are as follows: Note:General: WDWN elderly female in NAD. Pleasant and cooperative.  HEENT: [place]/AT, no facial masses  EYES: EOMI, no icterus  NECK: Supple, no obvious mass or thyroid enlargement.  CV: RRR.  CHEST: Breath sounds equal and clear. Respirations nonlabored.  ABDOMEN: Soft, nontender, nondistended, no masses, no organomegaly, active bowel sounds, midline scar with no hernias  MUSCULOSKELETAL: FROM, good muscle tone, no edema, no venous stasis changes  LYMPHATIC: No palpable cervical, supraclavicular adenopathy.  SKIN: No jaundice.  NEUROLOGIC: Alert and oriented, answers questions appropriately, normal gait and station.  PSYCHIATRIC: Normal mood, affect , and behavior.    Assessment & Plan Odis Hollingshead MD; 07/16/2015 12:28 PM) MALIGNANT NEOPLASM OF SIGMOID COLON (153.3  C18.7) Impression: She has a partially obstructing lesion. She is been on a liquid diet and has no obstructive symptoms at this time although we discussed the small possibility of that occurring. I recommend a laparoscopic possible open assisted partial colectomy and she is in agreement with this.  Plan: Laparoscopic-assisted partial  colectomy. One day bowel prep prior to surgery. Pured-type diet as well as protein supplementation.  I have explained the procedure and risks of colon resection. Risks include but are not limited to bleeding, infection, wound problems, anesthesia, anastomotic leak, need for colostomy, need for reoperative surgery, injury to intraabominal organs (such as intestine, spleen, kidney, bladder, ureter, etc.), ileus, irregular bowel habits. She seems to understand and agrees to proceed. All questions were answered. Current Plans  Follow up as needed Pt Education - Pamphlet Given - Colorectal Surgery: discussed with patient and provided information. Pt Education - [facility] Colon Bowel Prep 2015 Miralax/Antibiotics Started Neomycin Sulfate 500MG , 2 (two) Tablet SEE NOTE, #6, 07/16/2015, No Refill. Local Order: TAKE TWO TABLETS AT 2 PM, 3 PM, AND 10 PM THE DAY PRIOR TO SURGERY Started Flagyl 500MG , 2 (two) Tablet SEE NOTE, #6, 07/16/2015, No Refill. Local Order: Take at 2pm, 3pm, and 10pm the day prior to your colon operation Schedule for Surgery  Jackolyn Confer, MD

## 2015-08-01 NOTE — Patient Instructions (Addendum)
YOUR PROCEDURE IS SCHEDULED ON :  08/09/15  REPORT TO [facility] MAIN ENTRANCE FOLLOW SIGNS TO EAST ELEVATOR - GO TO 3rd FLOOR CHECK IN AT 3 EAST NURSES STATION (SHORT STAY) AT:  5:30 AM  CALL THIS NUMBER IF YOU HAVE PROBLEMS THE MORNING OF SURGERY [phone]  REMEMBER:ONLY 1 PER PERSON MAY GO TO SHORT STAY WITH YOU TO GET READY THE MORNING OF YOUR SURGERY  DO NOT EAT FOOD OR DRINK LIQUIDS AFTER MIDNIGHT  TAKE THESE MEDICINES THE MORNING OF SURGERY: LEVOTHYROXINE  STOP ASPIRIN / IBUPROFEN / ALEVE / VITAMINS / HERBAL MEDS __7__ DAYS BEFORE SURGERY  YOU MAY NOT HAVE ANY METAL ON YOUR BODY INCLUDING HAIR PINS AND PIERCING'S. DO NOT WEAR JEWELRY, MAKEUP, LOTIONS, POWDERS OR PERFUMES. DO NOT WEAR NAIL POLISH. DO NOT SHAVE 48 HRS PRIOR TO SURGERY. MEN MAY SHAVE FACE AND NECK.  DO NOT [place]. [facility] IS NOT RESPONSIBLE FOR VALUABLES.  CONTACTS, DENTURES OR PARTIALS MAY NOT BE WORN TO SURGERY. LEAVE SUITCASE IN CAR. CAN BE BROUGHT TO ROOM AFTER SURGERY.  PATIENTS DISCHARGED THE DAY OF SURGERY WILL NOT BE ALLOWED TO DRIVE HOME.  PLEASE READ OVER THE FOLLOWING INSTRUCTION SHEETS _________________________________________________________________________________                                          [facility] - PREPARING FOR SURGERY  Before surgery, you can play an important role.  Because skin is not sterile, your skin needs to be as free of germs as possible.  You can reduce the number of germs on your skin by washing with CHG (chlorahexidine gluconate) soap before surgery.  CHG is an antiseptic cleaner which kills germs and bonds with the skin to continue killing germs even after washing. Please DO NOT use if you have an allergy to CHG or antibacterial soaps.  If your skin becomes reddened/irritated stop using the CHG and inform your nurse when you arrive at Short Stay. Do not shave (including legs and underarms) for at least 48 hours prior  to the first CHG shower.  You may shave your face. Please follow these instructions carefully:   1.  Shower with CHG Soap the night before surgery and the  morning of Surgery.   2.  If you choose to wash your hair, wash your hair first as usual with your  normal  Shampoo.   3.  After you shampoo, rinse your hair and body thoroughly to remove the  shampoo.                                         4.  Use CHG as you would any other liquid soap.  You can apply chg directly  to the skin and wash . Gently wash with scrungie or clean wascloth    5.  Apply the CHG Soap to your body ONLY FROM THE NECK DOWN.   Do not use on open                           Wound or open sores. Avoid contact with eyes, ears mouth and genitals (private parts).  Genitals (private parts) with your normal soap.              6.  Wash thoroughly, paying special attention to the area where your surgery  will be performed.   7.  Thoroughly rinse your body with warm water from the neck down.   8.  DO NOT shower/wash with your normal soap after using and rinsing off  the CHG Soap .                9.  Pat yourself dry with a clean towel.             10.  Wear clean night clothes to bed after shower             11.  Place clean sheets on your bed the night of your first shower and do not  sleep with pets.  Day of Surgery : Do not apply any lotions/deodorants the morning of surgery.  Please wear clean clothes to the hospital/surgery center.  FAILURE TO FOLLOW THESE INSTRUCTIONS MAY RESULT IN THE CANCELLATION OF YOUR SURGERY    PATIENT SIGNATURE_________________________________  ______________________________________________________________________

## 2015-08-02 ENCOUNTER — Encounter (HOSPITAL_COMMUNITY): Payer: Self-pay

## 2015-08-02 ENCOUNTER — Encounter (HOSPITAL_COMMUNITY)
Admission: RE | Admit: 2015-08-02 | Discharge: 2015-08-02 | Disposition: A | Discharge status: Home / Self Care | Payer: Medicare Other | Source: Ambulatory Visit | Attending: General Surgery | Admitting: General Surgery

## 2015-08-02 ENCOUNTER — Ambulatory Visit (HOSPITAL_COMMUNITY)
Admission: RE | Admit: 2015-08-02 | Discharge: 2015-08-02 | Disposition: A | Discharge status: Home / Self Care | Payer: Medicare Other | Source: Ambulatory Visit | Attending: General Surgery | Admitting: General Surgery

## 2015-08-02 DIAGNOSIS — C189 Malignant neoplasm of colon, unspecified: Secondary | ICD-10-CM

## 2015-08-02 DIAGNOSIS — Z01812 Encounter for preprocedural laboratory examination: Secondary | ICD-10-CM | POA: Insufficient documentation

## 2015-08-02 DIAGNOSIS — Z01818 Encounter for other preprocedural examination: Secondary | ICD-10-CM | POA: Diagnosis present

## 2015-08-02 HISTORY — DX: Other specified postprocedural states: Z98.890

## 2015-08-02 HISTORY — DX: Other specified postprocedural states: R11.2

## 2015-08-02 HISTORY — DX: Other complications of anesthesia, initial encounter: T88.59XA

## 2015-08-02 HISTORY — DX: Anemia, unspecified: D64.9

## 2015-08-02 HISTORY — DX: Malignant neoplasm of colon, unspecified: C18.9

## 2015-08-02 HISTORY — DX: Adverse effect of unspecified anesthetic, initial encounter: T41.45XA

## 2015-08-02 LAB — CBC WITH DIFFERENTIAL/PLATELET
BASOS ABS: 0.1 10*3/uL (ref 0.0–0.1)
Basophils Relative: 2 %
EOS ABS: 0.1 10*3/uL (ref 0.0–0.7)
Eosinophils Relative: 2 %
HEMATOCRIT: 37.6 % (ref 36.0–46.0)
HEMOGLOBIN: 11.3 g/dL — AB (ref 12.0–15.0)
LYMPHS PCT: 25 %
Lymphs Abs: 1.1 10*3/uL (ref 0.7–4.0)
MCH: 21.4 pg — ABNORMAL LOW (ref 26.0–34.0)
MCHC: 30.1 g/dL (ref 30.0–36.0)
MCV: 71.1 fL — ABNORMAL LOW (ref 78.0–100.0)
MONOS PCT: 10 %
Monocytes Absolute: 0.4 10*3/uL (ref 0.1–1.0)
NEUTROS ABS: 2.6 10*3/uL (ref 1.7–7.7)
NEUTROS PCT: 61 %
Platelets: 421 10*3/uL — ABNORMAL HIGH (ref 150–400)
RBC: 5.29 MIL/uL — AB (ref 3.87–5.11)
RDW: 22.3 % — ABNORMAL HIGH (ref 11.5–15.5)
WBC: 4.3 10*3/uL (ref 4.0–10.5)

## 2015-08-02 LAB — COMPREHENSIVE METABOLIC PANEL
ALBUMIN: 4.4 g/dL (ref 3.5–5.0)
ALK PHOS: 83 U/L (ref 38–126)
ALT: 17 U/L (ref 14–54)
ANION GAP: 11 (ref 5–15)
AST: 43 U/L — AB (ref 15–41)
BILIRUBIN TOTAL: 0.6 mg/dL (ref 0.3–1.2)
BUN: 11 mg/dL (ref 6–20)
CALCIUM: 9.7 mg/dL (ref 8.9–10.3)
CO2: 24 mmol/L (ref 22–32)
Chloride: 106 mmol/L (ref 101–111)
Creatinine, Ser: 0.94 mg/dL (ref 0.44–1.00)
GFR calc Af Amer: >60 mL/min (ref >60)
GFR calc non Af Amer: 59 mL/min — ABNORMAL LOW (ref >60)
GLUCOSE: 112 mg/dL — AB (ref 65–99)
Potassium: 4 mmol/L (ref 3.5–5.1)
SODIUM: 141 mmol/L (ref 135–145)
TOTAL PROTEIN: 8.1 g/dL (ref 6.5–8.1)

## 2015-08-02 LAB — PROTIME-INR
INR: 1.07 (ref 0.00–1.49)
Prothrombin Time: 14.1 seconds (ref 11.6–15.2)

## 2015-08-03 LAB — CEA: CEA: 13.3 ng/mL — ABNORMAL HIGH (ref 0.0–4.7)

## 2015-08-08 NOTE — Anesthesia Preprocedure Evaluation (Addendum)
Anesthesia Evaluation  Patient identified by MRN, date of birth, ID band Patient awake    Reviewed: Allergy & Precautions, H&P , NPO status , Patient's Chart, lab work & pertinent test results  History of Anesthesia Complications (+) PONV  Airway Mallampati: II  TM Distance: >3 FB Neck ROM: full    Dental  (+) Dental Advisory Given, Edentulous Upper, Edentulous Lower   Pulmonary neg pulmonary ROS,    Pulmonary exam normal breath sounds clear to auscultation       Cardiovascular Exercise Tolerance: Good negative cardio ROS Normal cardiovascular exam Rhythm:regular Rate:Normal     Neuro/Psych  Headaches, negative neurological ROS  negative psych ROS   GI/Hepatic negative GI ROS, Neg liver ROS, Fatty liver disease Colon cancer   Endo/Other  Hypothyroidism   Renal/GU negative Renal ROS  negative genitourinary   Musculoskeletal   Abdominal   Peds  Hematology negative hematology ROS (+)   Anesthesia Other Findings   Reproductive/Obstetrics negative OB ROS                            Anesthesia Physical Anesthesia Plan  ASA: II  Anesthesia Plan: General   Post-op Pain Management:    Induction: Intravenous  Airway Management Planned: Oral ETT  Additional Equipment:   Intra-op Plan:   Post-operative Plan: Extubation in OR  Informed Consent: I have reviewed the patients History and Physical, chart, labs and discussed the procedure including the risks, benefits and alternatives for the proposed anesthesia with the patient or authorized representative who has indicated his/her understanding and acceptance.   Dental Advisory Given  Plan Discussed with: CRNA and Surgeon  Anesthesia Plan Comments:         Anesthesia Quick Evaluation

## 2015-08-09 ENCOUNTER — Inpatient Hospital Stay (HOSPITAL_COMMUNITY): Payer: Medicare Other | Admitting: Anesthesiology

## 2015-08-09 ENCOUNTER — Encounter (HOSPITAL_COMMUNITY): Payer: Self-pay | Admitting: *Deleted

## 2015-08-09 ENCOUNTER — Inpatient Hospital Stay (HOSPITAL_COMMUNITY)
Admission: AD | Admit: 2015-08-09 | Discharge: 2015-08-13 | DRG: 330 | Disposition: A | Discharge status: Home / Self Care | Payer: Medicare Other | Attending: General Surgery | Admitting: General Surgery

## 2015-08-09 ENCOUNTER — Encounter (HOSPITAL_COMMUNITY)
Admission: AD | Disposition: A | Discharge status: Home / Self Care | Payer: Self-pay | Source: Home / Self Care | Attending: General Surgery

## 2015-08-09 DIAGNOSIS — C187 Malignant neoplasm of sigmoid colon: Secondary | ICD-10-CM | POA: Diagnosis present

## 2015-08-09 DIAGNOSIS — E875 Hyperkalemia: Secondary | ICD-10-CM | POA: Diagnosis present

## 2015-08-09 DIAGNOSIS — C189 Malignant neoplasm of colon, unspecified: Secondary | ICD-10-CM | POA: Diagnosis present

## 2015-08-09 DIAGNOSIS — D62 Acute posthemorrhagic anemia: Secondary | ICD-10-CM | POA: Diagnosis not present

## 2015-08-09 DIAGNOSIS — Z79899 Other long term (current) drug therapy: Secondary | ICD-10-CM | POA: Diagnosis not present

## 2015-08-09 DIAGNOSIS — K660 Peritoneal adhesions (postprocedural) (postinfection): Secondary | ICD-10-CM | POA: Diagnosis present

## 2015-08-09 DIAGNOSIS — K760 Fatty (change of) liver, not elsewhere classified: Secondary | ICD-10-CM | POA: Diagnosis present

## 2015-08-09 HISTORY — PX: LAPAROSCOPIC PARTIAL COLECTOMY: SHX5907

## 2015-08-09 LAB — TYPE AND SCREEN
ABO/RH(D): O POS
Antibody Screen: NEGATIVE

## 2015-08-09 SURGERY — LAPAROSCOPIC PARTIAL COLECTOMY
Anesthesia: General | Site: Abdomen

## 2015-08-09 MED ORDER — PROPOFOL 10 MG/ML IV BOLUS
INTRAVENOUS | Status: DC | PRN
  Administered 2015-08-09: 150 mg via INTRAVENOUS

## 2015-08-09 MED ORDER — FENTANYL CITRATE (PF) 250 MCG/5ML IJ SOLN
INTRAMUSCULAR | Status: AC
  Filled 2015-08-09: qty 25

## 2015-08-09 MED ORDER — LACTATED RINGERS IR SOLN
Status: DC | PRN
  Administered 2015-08-09: 3000 mL

## 2015-08-09 MED ORDER — LEVOTHYROXINE SODIUM 137 MCG PO TABS
137.0000 ug | ORAL_TABLET | Freq: Every day | ORAL | Status: DC
  Administered 2015-08-10 – 2015-08-13 (×4): 137 ug via ORAL
  Filled 2015-08-09 (×5): qty 1

## 2015-08-09 MED ORDER — METOCLOPRAMIDE HCL 5 MG/ML IJ SOLN
INTRAMUSCULAR | Status: DC | PRN
  Administered 2015-08-09: 5 mg via INTRAVENOUS

## 2015-08-09 MED ORDER — DIPHENHYDRAMINE HCL 12.5 MG/5ML PO ELIX: 12.5000 mg | ORAL_SOLUTION | Freq: Four times a day (QID) | ORAL | Status: DC | PRN

## 2015-08-09 MED ORDER — ALVIMOPAN 12 MG PO CAPS
12.0000 mg | ORAL_CAPSULE | Freq: Two times a day (BID) | ORAL | Status: DC
  Administered 2015-08-10 – 2015-08-11 (×4): 12 mg via ORAL
  Filled 2015-08-09 (×4): qty 1

## 2015-08-09 MED ORDER — HYDROMORPHONE HCL 2 MG/ML IJ SOLN
INTRAMUSCULAR | Status: AC
  Filled 2015-08-09: qty 1

## 2015-08-09 MED ORDER — ONDANSETRON HCL 4 MG/2ML IJ SOLN
4.0000 mg | INTRAMUSCULAR | Status: DC | PRN
  Administered 2015-08-10 (×2): 4 mg via INTRAVENOUS
  Filled 2015-08-09 (×4): qty 2

## 2015-08-09 MED ORDER — ONDANSETRON HCL 4 MG PO TABS: 4.0000 mg | ORAL_TABLET | Freq: Four times a day (QID) | ORAL | Status: DC | PRN

## 2015-08-09 MED ORDER — ROCURONIUM BROMIDE 100 MG/10ML IV SOLN
INTRAVENOUS | Status: DC | PRN
  Administered 2015-08-09: 30 mg via INTRAVENOUS
  Administered 2015-08-09 (×3): 10 mg via INTRAVENOUS

## 2015-08-09 MED ORDER — DEXAMETHASONE SODIUM PHOSPHATE 10 MG/ML IJ SOLN
INTRAMUSCULAR | Status: DC | PRN
  Administered 2015-08-09: 10 mg via INTRAVENOUS

## 2015-08-09 MED ORDER — LIDOCAINE HCL (CARDIAC) 20 MG/ML IV SOLN
INTRAVENOUS | Status: DC | PRN
  Administered 2015-08-09: 50 mg via INTRAVENOUS

## 2015-08-09 MED ORDER — DIPHENHYDRAMINE HCL 50 MG/ML IJ SOLN: 12.5000 mg | Freq: Four times a day (QID) | INTRAMUSCULAR | Status: DC | PRN

## 2015-08-09 MED ORDER — CEFOTETAN DISODIUM-DEXTROSE 2-2.08 GM-% IV SOLR
INTRAVENOUS | Status: AC
  Filled 2015-08-09: qty 50

## 2015-08-09 MED ORDER — LIDOCAINE HCL (CARDIAC) 20 MG/ML IV SOLN
INTRAVENOUS | Status: AC
  Filled 2015-08-09: qty 5

## 2015-08-09 MED ORDER — DEXTROSE 5 % IV SOLN
2.0000 g | Freq: Two times a day (BID) | INTRAVENOUS | Status: AC
  Administered 2015-08-09: 2 g via INTRAVENOUS
  Filled 2015-08-09: qty 2

## 2015-08-09 MED ORDER — MIDAZOLAM HCL 2 MG/2ML IJ SOLN
INTRAMUSCULAR | Status: AC
  Filled 2015-08-09: qty 4

## 2015-08-09 MED ORDER — NALOXONE HCL 0.4 MG/ML IJ SOLN: 0.4000 mg | INTRAMUSCULAR | Status: DC | PRN

## 2015-08-09 MED ORDER — ONDANSETRON HCL 4 MG/2ML IJ SOLN
4.0000 mg | Freq: Four times a day (QID) | INTRAMUSCULAR | Status: DC | PRN
  Administered 2015-08-09 – 2015-08-11 (×3): 4 mg via INTRAVENOUS
  Filled 2015-08-09: qty 2

## 2015-08-09 MED ORDER — ONDANSETRON HCL 4 MG/2ML IJ SOLN
INTRAMUSCULAR | Status: AC
  Filled 2015-08-09: qty 2

## 2015-08-09 MED ORDER — DEXTROSE-NACL 5-0.9 % IV SOLN
INTRAVENOUS | Status: DC
  Administered 2015-08-10 – 2015-08-13 (×5): via INTRAVENOUS

## 2015-08-09 MED ORDER — ALVIMOPAN 12 MG PO CAPS
12.0000 mg | ORAL_CAPSULE | Freq: Once | ORAL | Status: AC
  Administered 2015-08-09: 12 mg via ORAL
  Filled 2015-08-09: qty 1

## 2015-08-09 MED ORDER — LACTATED RINGERS IV SOLN: INTRAVENOUS | Status: DC

## 2015-08-09 MED ORDER — BUPIVACAINE HCL (PF) 0.5 % IJ SOLN
INTRAMUSCULAR | Status: DC | PRN
  Administered 2015-08-09: 3 mL

## 2015-08-09 MED ORDER — GLYCOPYRROLATE 0.2 MG/ML IJ SOLN
INTRAMUSCULAR | Status: DC | PRN
  Administered 2015-08-09: 0.6 mg via INTRAVENOUS

## 2015-08-09 MED ORDER — BUPIVACAINE HCL (PF) 0.5 % IJ SOLN
INTRAMUSCULAR | Status: AC
  Filled 2015-08-09: qty 30

## 2015-08-09 MED ORDER — MORPHINE SULFATE 1 MG/ML IV SOLN
INTRAVENOUS | Status: AC
  Filled 2015-08-09: qty 25

## 2015-08-09 MED ORDER — HYDROMORPHONE HCL 1 MG/ML IJ SOLN
INTRAMUSCULAR | Status: AC
  Filled 2015-08-09: qty 1

## 2015-08-09 MED ORDER — EPHEDRINE SULFATE 50 MG/ML IJ SOLN
INTRAMUSCULAR | Status: AC
  Filled 2015-08-09: qty 1

## 2015-08-09 MED ORDER — FENTANYL CITRATE (PF) 100 MCG/2ML IJ SOLN
INTRAMUSCULAR | Status: DC | PRN
  Administered 2015-08-09 (×12): 50 ug via INTRAVENOUS

## 2015-08-09 MED ORDER — ONDANSETRON HCL 4 MG/2ML IJ SOLN
INTRAMUSCULAR | Status: DC | PRN
  Administered 2015-08-09 (×2): 2 mg via INTRAVENOUS
  Administered 2015-08-09: 4 mg via INTRAVENOUS

## 2015-08-09 MED ORDER — LACTATED RINGERS IV SOLN
INTRAVENOUS | Status: DC | PRN
  Administered 2015-08-09 (×4): via INTRAVENOUS

## 2015-08-09 MED ORDER — SUCCINYLCHOLINE CHLORIDE 20 MG/ML IJ SOLN
INTRAMUSCULAR | Status: DC | PRN
  Administered 2015-08-09: 100 mg via INTRAVENOUS

## 2015-08-09 MED ORDER — MIDAZOLAM HCL 5 MG/5ML IJ SOLN
INTRAMUSCULAR | Status: DC | PRN
  Administered 2015-08-09: .5 mg via INTRAVENOUS
  Administered 2015-08-09 (×2): 0.5 mg via INTRAVENOUS
  Administered 2015-08-09: .5 mg via INTRAVENOUS

## 2015-08-09 MED ORDER — SODIUM CHLORIDE 0.9 % IJ SOLN
INTRAMUSCULAR | Status: AC
  Filled 2015-08-09: qty 10

## 2015-08-09 MED ORDER — PANTOPRAZOLE SODIUM 40 MG IV SOLR
40.0000 mg | Freq: Every day | INTRAVENOUS | Status: DC
  Administered 2015-08-09 – 2015-08-11 (×3): 40 mg via INTRAVENOUS
  Filled 2015-08-09 (×4): qty 40

## 2015-08-09 MED ORDER — ROCURONIUM BROMIDE 100 MG/10ML IV SOLN
INTRAVENOUS | Status: AC
  Filled 2015-08-09: qty 1

## 2015-08-09 MED ORDER — HYDROMORPHONE HCL 1 MG/ML IJ SOLN
0.2500 mg | INTRAMUSCULAR | Status: DC | PRN
  Administered 2015-08-09 (×2): 0.5 mg via INTRAVENOUS
  Administered 2015-08-09 (×2): 0.25 mg via INTRAVENOUS

## 2015-08-09 MED ORDER — PHENYLEPHRINE HCL 10 MG/ML IJ SOLN
INTRAMUSCULAR | Status: DC | PRN
  Administered 2015-08-09 (×3): 40 ug via INTRAVENOUS

## 2015-08-09 MED ORDER — ALBUTEROL SULFATE (2.5 MG/3ML) 0.083% IN NEBU: 2.5000 mg | INHALATION_SOLUTION | Freq: Four times a day (QID) | RESPIRATORY_TRACT | Status: DC | PRN

## 2015-08-09 MED ORDER — 0.9 % SODIUM CHLORIDE (POUR BTL) OPTIME
TOPICAL | Status: DC | PRN
  Administered 2015-08-09 (×5): 1000 mL

## 2015-08-09 MED ORDER — FENTANYL CITRATE (PF) 100 MCG/2ML IJ SOLN
INTRAMUSCULAR | Status: AC
  Filled 2015-08-09: qty 4

## 2015-08-09 MED ORDER — PROPOFOL 10 MG/ML IV BOLUS
INTRAVENOUS | Status: AC
  Filled 2015-08-09: qty 20

## 2015-08-09 MED ORDER — CEFOTETAN DISODIUM-DEXTROSE 2-2.08 GM-% IV SOLR
2.0000 g | INTRAVENOUS | Status: AC
  Administered 2015-08-09: 2 g via INTRAVENOUS

## 2015-08-09 MED ORDER — HYDROMORPHONE HCL 1 MG/ML IJ SOLN
INTRAMUSCULAR | Status: DC | PRN
  Administered 2015-08-09 (×2): 1 mg via INTRAVENOUS

## 2015-08-09 MED ORDER — METOCLOPRAMIDE HCL 5 MG/ML IJ SOLN
INTRAMUSCULAR | Status: AC
  Filled 2015-08-09: qty 2

## 2015-08-09 MED ORDER — MORPHINE SULFATE 1 MG/ML IV SOLN
INTRAVENOUS | Status: DC
  Administered 2015-08-09: 1 mg via INTRAVENOUS
  Administered 2015-08-09: 11 mg via INTRAVENOUS
  Administered 2015-08-09: 2 mg via INTRAVENOUS
  Administered 2015-08-10: 17.55 mg via INTRAVENOUS
  Administered 2015-08-10: 22 mg via INTRAVENOUS
  Administered 2015-08-10: 16 mg via INTRAVENOUS
  Administered 2015-08-10 (×2): via INTRAVENOUS
  Administered 2015-08-10: 14 mg via INTRAVENOUS
  Administered 2015-08-10: 24.99 mg via INTRAVENOUS
  Administered 2015-08-10: via INTRAVENOUS
  Administered 2015-08-11: 15 mg via INTRAVENOUS
  Administered 2015-08-11: 1 mg via INTRAVENOUS
  Administered 2015-08-11: 12 mg via INTRAVENOUS
  Administered 2015-08-11: 1 mg via INTRAVENOUS
  Administered 2015-08-12: 17 mg via INTRAVENOUS
  Administered 2015-08-12 (×2): 10 mg via INTRAVENOUS
  Administered 2015-08-12: 13:00:00 via INTRAVENOUS
  Administered 2015-08-13: 10 mg via INTRAVENOUS
  Administered 2015-08-13: 21 mg via INTRAVENOUS
  Administered 2015-08-13: 1 mg via INTRAVENOUS
  Filled 2015-08-09 (×13): qty 25

## 2015-08-09 MED ORDER — DEXAMETHASONE SODIUM PHOSPHATE 10 MG/ML IJ SOLN
INTRAMUSCULAR | Status: AC
Start: 2015-08-09 — End: 2015-08-09
  Filled 2015-08-09: qty 1

## 2015-08-09 MED ORDER — NEOSTIGMINE METHYLSULFATE 10 MG/10ML IV SOLN
INTRAVENOUS | Status: DC | PRN
  Administered 2015-08-09: 4 mg via INTRAVENOUS

## 2015-08-09 MED ORDER — SODIUM CHLORIDE 0.9 % IJ SOLN: 9.0000 mL | INTRAMUSCULAR | Status: DC | PRN

## 2015-08-09 SURGICAL SUPPLY — 91 items
APPLIER CLIP 5 13 M/L LIGAMAX5 (MISCELLANEOUS)
APPLIER CLIP ROT 10 11.4 M/L (STAPLE)
BLADE EXTENDED COATED 6.5IN (ELECTRODE) IMPLANT
CABLE HIGH FREQUENCY MONO STRZ (ELECTRODE) ×2 IMPLANT
CELLS DAT CNTRL 66122 CELL SVR (MISCELLANEOUS) IMPLANT
CHLORAPREP W/TINT 26ML (MISCELLANEOUS) ×2 IMPLANT
CLIP APPLIE 5 13 M/L LIGAMAX5 (MISCELLANEOUS) IMPLANT
CLIP APPLIE ROT 10 11.4 M/L (STAPLE) IMPLANT
CLIP TI LARGE 6 (CLIP) ×2 IMPLANT
COUNTER NEEDLE 20 DBL MAG RED (NEEDLE) ×2 IMPLANT
COVER MAYO STAND STRL (DRAPES) ×6 IMPLANT
COVER SURGICAL LIGHT HANDLE (MISCELLANEOUS) ×4 IMPLANT
DECANTER SPIKE VIAL GLASS SM (MISCELLANEOUS) IMPLANT
DISSECTOR BLUNT TIP ENDO 5MM (MISCELLANEOUS) IMPLANT
DRAIN CHANNEL 19F RND (DRAIN) ×2 IMPLANT
DRAPE CAMERA CLOSED 9X96 (DRAPES) IMPLANT
DRAPE LAPAROSCOPIC ABDOMINAL (DRAPES) ×2 IMPLANT
DRAPE LG THREE QUARTER DISP (DRAPES) IMPLANT
DRAPE SURG IRRIG POUCH 19X23 (DRAPES) IMPLANT
DRAPE UTILITY XL STRL (DRAPES) IMPLANT
DRSG OPSITE POSTOP 4X10 (GAUZE/BANDAGES/DRESSINGS) IMPLANT
DRSG OPSITE POSTOP 4X6 (GAUZE/BANDAGES/DRESSINGS) IMPLANT
DRSG OPSITE POSTOP 4X8 (GAUZE/BANDAGES/DRESSINGS) ×2 IMPLANT
DRSG TEGADERM 2-3/8X2-3/4 SM (GAUZE/BANDAGES/DRESSINGS) IMPLANT
ELECT PENCIL ROCKER SW 15FT (MISCELLANEOUS) IMPLANT
ELECT REM PT RETURN 15FT ADLT (MISCELLANEOUS) ×2 IMPLANT
EVACUATOR SILICONE 100CC (DRAIN) ×2 IMPLANT
FILTER SMOKE EVAC LAPAROSHD (FILTER) IMPLANT
GAUZE SPONGE 2X2 8PLY STRL LF (GAUZE/BANDAGES/DRESSINGS) IMPLANT
GAUZE SPONGE 4X4 12PLY STRL (GAUZE/BANDAGES/DRESSINGS) ×2 IMPLANT
GLOVE BIO SURGEON STRL SZ7.5 (GLOVE) ×2 IMPLANT
GLOVE BIOGEL PI IND STRL 6.5 (GLOVE) ×2 IMPLANT
GLOVE BIOGEL PI IND STRL 7.0 (GLOVE) ×3 IMPLANT
GLOVE BIOGEL PI IND STRL 7.5 (GLOVE) ×6 IMPLANT
GLOVE BIOGEL PI INDICATOR 6.5 (GLOVE) ×2
GLOVE BIOGEL PI INDICATOR 7.0 (GLOVE) ×3
GLOVE BIOGEL PI INDICATOR 7.5 (GLOVE) ×6
GLOVE ECLIPSE 8.0 STRL XLNG CF (GLOVE) ×4 IMPLANT
GLOVE INDICATOR 8.0 STRL GRN (GLOVE) ×4 IMPLANT
GLOVE SURG SIGNA 7.5 PF LTX (GLOVE) ×4 IMPLANT
GLOVE SURG SS PI 7.0 STRL IVOR (GLOVE) ×6 IMPLANT
GOWN STRL REUS W/ TWL LRG LVL3 (GOWN DISPOSABLE) ×5 IMPLANT
GOWN STRL REUS W/TWL LRG LVL3 (GOWN DISPOSABLE) ×5
GOWN STRL REUS W/TWL XL LVL3 (GOWN DISPOSABLE) ×12 IMPLANT
LEGGING LITHOTOMY PAIR STRL (DRAPES) IMPLANT
LIGASURE IMPACT 36 18CM CVD LR (INSTRUMENTS) ×2 IMPLANT
MANIFOLD NEPTUNE II (INSTRUMENTS) ×2 IMPLANT
PACK COLON (CUSTOM PROCEDURE TRAY) ×2 IMPLANT
PAD POSITIONING PINK XL (MISCELLANEOUS) ×2 IMPLANT
PORT LAP GEL ALEXIS MED 5-9CM (MISCELLANEOUS) IMPLANT
RELOAD PROXIMATE 75MM BLUE (ENDOMECHANICALS) ×4 IMPLANT
RTRCTR WOUND ALEXIS 18CM MED (MISCELLANEOUS)
SCISSORS LAP 5X35 DISP (ENDOMECHANICALS) ×2 IMPLANT
SCISSORS LAP 5X45 EPIX DISP (ENDOMECHANICALS) ×2 IMPLANT
SET IRRIG TUBING LAPAROSCOPIC (IRRIGATION / IRRIGATOR) ×2 IMPLANT
SHEARS CURVED HARMONIC AC 45CM (MISCELLANEOUS) ×2 IMPLANT
SHEARS HARMONIC ACE PLUS 36CM (ENDOMECHANICALS) IMPLANT
SHEARS HARMONIC ACE PLUS 45CM (MISCELLANEOUS) IMPLANT
SLEEVE SURGEON STRL (DRAPES) ×2 IMPLANT
SLEEVE XCEL OPT CAN 5 100 (ENDOMECHANICALS) ×8 IMPLANT
SPONGE GAUZE 2X2 STER 10/PKG (GAUZE/BANDAGES/DRESSINGS)
SPONGE LAP 18X18 X RAY DECT (DISPOSABLE) IMPLANT
STAPLER CIRC CVD 29MM 37CM (STAPLE) ×2 IMPLANT
STAPLER CUT CVD 40MM BLUE (STAPLE) ×2 IMPLANT
STAPLER PROXIMATE 75MM BLUE (STAPLE) ×2 IMPLANT
STAPLER VISISTAT 35W (STAPLE) ×2 IMPLANT
SUCTION POOLE TIP (SUCTIONS) IMPLANT
SUT ETHILON 3 0 PS 1 (SUTURE) ×2 IMPLANT
SUT PDS AB 1 CTX 36 (SUTURE) IMPLANT
SUT PDS AB 1 TP1 96 (SUTURE) IMPLANT
SUT PROLENE 2 0 KS (SUTURE) IMPLANT
SUT PROLENE 2 0 SH DA (SUTURE) ×2 IMPLANT
SUT SILK 2 0 (SUTURE) ×1
SUT SILK 2 0 SH CR/8 (SUTURE) ×2 IMPLANT
SUT SILK 2-0 18XBRD TIE 12 (SUTURE) ×1 IMPLANT
SUT SILK 3 0 (SUTURE) ×1
SUT SILK 3 0 SH CR/8 (SUTURE) ×2 IMPLANT
SUT SILK 3-0 18XBRD TIE 12 (SUTURE) ×1 IMPLANT
SUT VICRYL 2 0 18  UND BR (SUTURE)
SUT VICRYL 2 0 18 UND BR (SUTURE) IMPLANT
SYS LAPSCP GELPORT 120MM (MISCELLANEOUS) ×2
SYSTEM LAPSCP GELPORT 120MM (MISCELLANEOUS) ×1 IMPLANT
TAPE CLOTH SURG 4X10 WHT LF (GAUZE/BANDAGES/DRESSINGS) ×2 IMPLANT
TOWEL OR 17X26 10 PK STRL BLUE (TOWEL DISPOSABLE) IMPLANT
TOWEL OR NON WOVEN STRL DISP B (DISPOSABLE) ×2 IMPLANT
TRAY FOLEY W/METER SILVER 14FR (SET/KITS/TRAYS/PACK) ×2 IMPLANT
TRAY FOLEY W/METER SILVER 16FR (SET/KITS/TRAYS/PACK) IMPLANT
TROCAR BLADELESS OPT 5 100 (ENDOMECHANICALS) ×2 IMPLANT
TROCAR XCEL BLUNT TIP 100MML (ENDOMECHANICALS) IMPLANT
TROCAR XCEL NON-BLD 11X100MML (ENDOMECHANICALS) IMPLANT
TUBING FILTER THERMOFLATOR (ELECTROSURGICAL) ×2 IMPLANT

## 2015-08-09 NOTE — Anesthesia Postprocedure Evaluation (Signed)
  Anesthesia Post-op Note  Patient: Nichole Cross  Procedure(s) Performed: Procedure(s) (LRB): LAPAROSCOPIC PARTIAL COLECTOMY (N/A)  Patient Location: PACU  Anesthesia Type: General  Level of Consciousness: awake and alert   Airway and Oxygen Therapy: Patient Spontanous Breathing  Post-op Pain: mild  Post-op Assessment: Post-op Vital signs reviewed, Patient's Cardiovascular Status Stable, Respiratory Function Stable, Patent Airway and No signs of Nausea or vomiting  Last Vitals:  Filed Vitals:   08/09/15 1200  BP: 138/69  Pulse: 105  Temp:   Resp: 10    Post-op Vital Signs: stable   Complications: No apparent anesthesia complications

## 2015-08-09 NOTE — Anesthesia Procedure Notes (Signed)
Procedure Name: Intubation Date/Time: 08/09/2015 7:45 AM Performed by: Ofilia Neas Pre-anesthesia Checklist: Patient identified, Emergency Drugs available, Suction available, Patient being monitored and Timeout performed Patient Re-evaluated:Patient Re-evaluated prior to inductionOxygen Delivery Method: Circle system utilized Preoxygenation: Pre-oxygenation with 100% oxygen Intubation Type: IV induction Laryngoscope Size: Mac and 3 Grade View: Grade I Tube type: Oral Tube size: 7.5 mm Number of attempts: 1 Airway Equipment and Method: Stylet Placement Confirmation: ETT inserted through vocal cords under direct vision,  positive ETCO2 and breath sounds checked- equal and bilateral Secured at: 21 cm Tube secured with: Tape Dental Injury: Teeth and Oropharynx as per pre-operative assessment

## 2015-08-09 NOTE — H&P (View-Only) (Signed)
Nichole Cross 07/16/2015 11:18 AM Location: [facility] Patient #: [id number] DOB: 03/20/1941 Single / Language: English / Race: White Female  History of Present Illness (Peace Noyes J. Shermon Bozzi MD; 07/16/2015 12:25 PM) Patient words: colon ca.  The patient is a 74 year old female   Note:She is referred by Dr. Badreddine, [facility], because of newly diagnosed adenocarcinoma of the sigmoid colon. She presented with left lower quadrant pain and bloody stools and was noted to be anemic. There was concern for diverticulitis and she was treated with antibiotics. CT scan demonstrated a concentric lesion in the sigmoid colon area with some rounding enlarged lymph nodes. No liver tumors. Colonoscopy demonstrated a lesion 25 cm from the anal verge that the colonoscope could not traverse. Biopsies were consistent with adenocarcinoma. She is referred here for further evaluation and treatment. She has a previous history of a hysterectomy as well as intra-abdominal tumor removal through midline incisions. I have reviewed her records including her CT report as well as colonoscopy report and pathology. Her daughter is here with her.   Other Problems (Sonya Bynum, CMA; 07/16/2015 11:18 AM) Arthritis Migraine Headache Thyroid Disease  Past Surgical History (Sonya Bynum, CMA; 07/16/2015 11:18 AM) Hemorrhoidectomy Hysterectomy (not due to cancer) - Complete Knee Surgery Left. Thyroid Surgery  Diagnostic Studies History (Sonya Bynum, CMA; 07/16/2015 11:18 AM) Colonoscopy within last year Mammogram within last year  Allergies (Sonya Bynum, CMA; 07/16/2015 11:19 AM) No Known Drug Allergies08/29/2016  Medication History (Sonya Bynum, CMA; 07/16/2015 11:20 AM) Alendronate Sodium (70MG Tablet, Oral) Active. Fluticasone Propionate (50MCG/ACT Suspension, Nasal) Active. Levothyroxine Sodium (137MCG Tablet, Oral) Active. Ondansetron HCl (4MG Tablet, Oral as  needed) Active. ProAir HFA (108 (90 Base)MCG/ACT Aerosol Soln, Inhalation) Active. Omeprazole (40MG Capsule DR, Oral) Active. Medications Reconciled  Social History (Sonya Bynum, CMA; 07/16/2015 11:18 AM) Caffeine use Carbonated beverages, Coffee, Tea. No alcohol use No drug use Tobacco use Never smoker.  Family History (Sonya Bynum, CMA; 07/16/2015 11:18 AM) Arthritis Mother. Breast Cancer Sister. Diabetes Mellitus Mother. Heart Disease Mother. Heart disease in female family member before age 65 Malignant Neoplasm Of Pancreas Mother. Migraine Headache Daughter. Thyroid problems Daughter.  Pregnancy / Birth History (Sonya Bynum, CMA; 07/16/2015 11:18 AM) Gravida 3 Maternal age 21-25 Para 3  Review of Systems (Sonya Bynum CMA; 07/16/2015 11:18 AM) General Present- Fatigue, Night Sweats and Weight Loss. Not Present- Appetite Loss, Chills, Fever and Weight Gain. Skin Not Present- Change in Wart/Mole, Dryness, Hives, Jaundice, New Lesions, Non-Healing Wounds, Rash and Ulcer. HEENT Present- Seasonal Allergies. Not Present- Earache, Hearing Loss, Hoarseness, Nose Bleed, Oral Ulcers, Ringing in the Ears, Sinus Pain, Sore Throat, Visual Disturbances, Wears glasses/contact lenses and Yellow Eyes. Respiratory Not Present- Bloody sputum, Chronic Cough, Difficulty Breathing, Snoring and Wheezing. Breast Not Present- Breast Mass, Breast Pain, Nipple Discharge and Skin Changes. Cardiovascular Present- Leg Cramps. Not Present- Chest Pain, Difficulty Breathing Lying Down, Palpitations, Rapid Heart Rate, Shortness of Breath and Swelling of Extremities. Gastrointestinal Present- Abdominal Pain, Bloody Stool, Chronic diarrhea, Nausea and Vomiting. Not Present- Bloating, Change in Bowel Habits, Constipation, Difficulty Swallowing, Excessive gas, Gets full quickly at meals, Hemorrhoids, Indigestion and Rectal Pain. Musculoskeletal Present- Joint Stiffness. Not Present- Back Pain, Joint  Pain, Muscle Pain, Muscle Weakness and Swelling of Extremities. Neurological Present- Headaches. Not Present- Decreased Memory, Fainting, Numbness, Seizures, Tingling, Tremor, Trouble walking and Weakness. Psychiatric Not Present- Anxiety, Bipolar, Change in Sleep Pattern, Depression, Fearful and Frequent crying. Hematology Present- Easy Bruising. Not Present- Excessive bleeding, Gland problems,   HIV and Persistent Infections.   Vitals (Sonya Bynum CMA; 07/16/2015 11:19 AM) 07/16/2015 11:18 AM Weight: 173 lb Height: 69in Body Surface Area: 1.95 m Body Mass Index: 25.55 kg/m Temp.: 98F(Temporal)  Pulse: 77 (Regular)  BP: 128/76 (Sitting, Left Arm, Standard)    Physical Exam (Farryn Linares J. Kern Gingras MD; 07/16/2015 12:26 PM) The physical exam findings are as follows: Note:General: WDWN elderly female in NAD. Pleasant and cooperative.  HEENT: [place]/AT, no facial masses  EYES: EOMI, no icterus  NECK: Supple, no obvious mass or thyroid enlargement.  CV: RRR.  CHEST: Breath sounds equal and clear. Respirations nonlabored.  ABDOMEN: Soft, nontender, nondistended, no masses, no organomegaly, active bowel sounds, midline scar with no hernias  MUSCULOSKELETAL: FROM, good muscle tone, no edema, no venous stasis changes  LYMPHATIC: No palpable cervical, supraclavicular adenopathy.  SKIN: No jaundice.  NEUROLOGIC: Alert and oriented, answers questions appropriately, normal gait and station.  PSYCHIATRIC: Normal mood, affect , and behavior.    Assessment & Plan (Nyssa Sayegh J. Gaje Tennyson MD; 07/16/2015 12:28 PM) MALIGNANT NEOPLASM OF SIGMOID COLON (153.3  C18.7) Impression: She has a partially obstructing lesion. She is been on a liquid diet and has no obstructive symptoms at this time although we discussed the small possibility of that occurring. I recommend a laparoscopic possible open assisted partial colectomy and she is in agreement with this.  Plan: Laparoscopic-assisted partial  colectomy. One day bowel prep prior to surgery. Pured-type diet as well as protein supplementation.  I have explained the procedure and risks of colon resection. Risks include but are not limited to bleeding, infection, wound problems, anesthesia, anastomotic leak, need for colostomy, need for reoperative surgery, injury to intraabominal organs (such as intestine, spleen, kidney, bladder, ureter, etc.), ileus, irregular bowel habits. She seems to understand and agrees to proceed. All questions were answered. Current Plans  Follow up as needed Pt Education - Pamphlet Given - Colorectal Surgery: discussed with patient and provided information. Pt Education - [facility] Colon Bowel Prep 2015 Miralax/Antibiotics Started Neomycin Sulfate 500MG, 2 (two) Tablet SEE NOTE, #6, 07/16/2015, No Refill. Local Order: TAKE TWO TABLETS AT 2 PM, 3 PM, AND 10 PM THE DAY PRIOR TO SURGERY Started Flagyl 500MG, 2 (two) Tablet SEE NOTE, #6, 07/16/2015, No Refill. Local Order: Take at 2pm, 3pm, and 10pm the day prior to your colon operation Schedule for Surgery  Alleyne Lac, MD 

## 2015-08-09 NOTE — Interval H&P Note (Signed)
History and Physical Interval Note:  08/09/2015 7:25 AM  Nichole Cross  has presented today for surgery, with the diagnosis of Colon Cancer  The various methods of treatment have been discussed with the patient and family. After consideration of risks, benefits and other options for treatment, the patient has consented to  Procedure(s): LAPAROSCOPIC PARTIAL COLECTOMY (N/A) as a surgical intervention .  The patient's history has been reviewed, patient examined, no change in status, stable for surgery.  I have reviewed the patient's chart and labs.  Questions were answered to the patient's satisfaction.     ROSENBOWER,TODD Lenna Sciara

## 2015-08-09 NOTE — Op Note (Signed)
Operative Note  Jamye Balicki Danh female 74 y.o. 08/09/2015  PREOPERATIVE DX:  Sigmoid colon cancer  POSTOPERATIVE DX:  Same  PROCEDURE:   Laparoscopic assisted sigmoid colectomy with lysis of adhesions( 60 minutes).         Surgeon: Odis Hollingshead   Assistants: Coralie Keens M.D.  Anesthesia: General endotracheal anesthesia  Indications:   This is a 74 year old female discovered to be anemic with small-caliber stools. She is discovered to have a sigmoid colon cancer and now presents for elective resection. Preop CEA level was 13.    Procedure Detail:  She was brought to the operating room and placed supine on the operating table and a general anesthetic was given. She was placed in the lithotomy position. Foley catheter was inserted. Oral gastric tube was inserted. The abdominal wall and perineal areas were widely sterilely prepped and draped.  A 5 mm incision was made in the left upper quadrant. She was placed in slight reversed Trendelenburg position. Using a 5 mm laparoscope and Optiview trocar, access was gained into the peritoneal cavity and pneumoperitoneum was created. Inspection of the area under the trocar demonstrated no evidence of bleeding or organ injury. There were multiple adhesions noted between the omentum and anterior abdominal wall in the lower midline as well as in the left mid abdomen and left lower quadrant.  Fatty infiltration of the liver was noted but there are no lesions noted in the liver. A 5 mm trocar was placed in the right lower quadrant and sharp adhesiolysis was performed. A 5 mm trocar was then placed in the midepigastrium. A 5 mm trocar was placed in the right upper quadrant. I then continued the lysis of adhesions using the Harmonic scalpel as well as sharp dissection mobilizing omentum off the anterior abdominal wall from just above the umbilicus down to the pelvis. There were adhesions between the omentum and left lateral abdominal wall going all  the way up to the splenic flexure. These were lysed sharply. There were adhesions between the colon and left mid abdominal wall that were lysed sharply. Using sharp dissection, I mobilized the left colon and proximal sigmoid colon. I then mobilized part of the distal sigmoid colon as there are adhesions present between the bladder and the sigmoid colon.  A 5 mm trocar was placed in the lower midline area through a previous scar. I perform more lysis of adhesions between the colon and pelvic sidewall sharply. I then made a limited lower midline incision where the trocar was then placed a wound protection device. I was now able to palpate the tumor  which was stuck to the left lower pelvic sidewall. I mobilized the sigmoid colon proximal to this. Both right and left ureters were identified and preserved. There were peritoneal adhesions to the anterior rectum just below the tumor which I mobilized. I subsequently divided the sigmoid colon at its junction with the descending colon and divided the mesentery straight posteriorly. I then carried this down into the pelvis. I mobilized the tumor free from the lateral pelvic sidewall keeping some peritoneum on the tumor and marked this area on the pelvic sidewall with a Hemoclip. I was able to get below the tumor and then skeletonized the rectum well distal to it. I divided the rectum distal to the tumor with a linear cutting stapler. The specimen was then marked and passed off the field.  A side to size #29 EEA's stapled anastomosis was planned in a Baker-type fashion. The descending colon staple line  was removed. A size 29 anvil was then placed into the staple line. I then resected part of the descending colon distal to where the anvil was. The anvil trocar was brought out the side of the descending colon through the tinea and then secured with a 2-0 Prolene pursestring suture. The anus was then dilated and the handle of the EEA stapler was introduced through the anus.  An end rectum to side descending colon anastomosis was then performed. The anastomosis appeared patent, viable, and under no tension. Air leak test was performed and was negative.  A 5 mm incision was then placed in the left lower quadrant. A 19 Blake drain was placed into the peritoneal cavity through this incision and placed in the pelvis. It was anchored to the skin with 3-0 nylon suture. The wound was then copiously irrigated with saline. There was some bleeding from peritoneal edges in the pelvis that was controlled with cautery. Further inspection demonstrated no evidence of bleeding or organ injury.  The fascia of the limited lower midline incision was closed with running double-stranded PDS suture. Laparoscopic examination was then performed. 4 quadrant inspection demonstrated no evidence of bleeding or organ injury. Fascial closure was solid. The pneumoperitoneum was released. The remaining trocars were removed.  All skin incisions were then closed with staples. The drain was hooked up to closed suction.  She tolerated the procedure well without any apparent complications. She was taken recovery room in satisfactory condition.  Estimated Blood Loss:  400 ml         Drains: #19 Blake in pelvis         Blood Given: none          Specimens: sigmoid colon        Complications:  * No complications entered in OR log *         Disposition: PACU - hemodynamically stable.         Condition: stable

## 2015-08-09 NOTE — Anesthesia Postprocedure Evaluation (Signed)
  Anesthesia Post-op Note  Patient: Nichole Cross  Procedure(s) Performed: Procedure(s) (LRB): LAPAROSCOPIC PARTIAL COLECTOMY (N/A)  Patient Location: PACU  Anesthesia Type: General  Level of Consciousness: awake and alert   Airway and Oxygen Therapy: Patient Spontanous Breathing  Post-op Pain: mild  Post-op Assessment: Post-op Vital signs reviewed, Patient's Cardiovascular Status Stable, Respiratory Function Stable, Patent Airway and No signs of Nausea or vomiting  Last Vitals:  Filed Vitals:   08/09/15 1553  BP:   Pulse:   Temp:   Resp: 16    Post-op Vital Signs: stable   Complications: No apparent anesthesia complications

## 2015-08-09 NOTE — Transfer of Care (Signed)
Immediate Anesthesia Transfer of Care Note  Patient: Nichole Cross  Procedure(s) Performed: Procedure(s): LAPAROSCOPIC PARTIAL COLECTOMY (N/A)  Patient Location: PACU  Anesthesia Type:General  Level of Consciousness: awake, alert , oriented, patient cooperative and responds to stimulation  Airway & Oxygen Therapy: Patient Spontanous Breathing and Patient connected to face mask oxygen  Post-op Assessment: Report given to RN, Post -op Vital signs reviewed and stable and Patient moving all extremities  Post vital signs: Reviewed and stable  Last Vitals:  Filed Vitals:   08/09/15 0539  BP: 126/76  Pulse: 84  Temp: 36.3 C  Resp: 18    Complications: No apparent anesthesia complications

## 2015-08-10 LAB — CBC
HCT: 28.5 % — ABNORMAL LOW (ref 36.0–46.0)
Hemoglobin: 8.6 g/dL — ABNORMAL LOW (ref 12.0–15.0)
MCH: 22.4 pg — AB (ref 26.0–34.0)
MCHC: 30.2 g/dL (ref 30.0–36.0)
MCV: 74.2 fL — ABNORMAL LOW (ref 78.0–100.0)
Platelets: 287 10*3/uL (ref 150–400)
RBC: 3.84 MIL/uL — ABNORMAL LOW (ref 3.87–5.11)
RDW: 23.6 % — AB (ref 11.5–15.5)
WBC: 7.6 10*3/uL (ref 4.0–10.5)

## 2015-08-10 LAB — BASIC METABOLIC PANEL
Anion gap: 5 (ref 5–15)
BUN: 6 mg/dL (ref 6–20)
CALCIUM: 8.5 mg/dL — AB (ref 8.9–10.3)
CO2: 26 mmol/L (ref 22–32)
Chloride: 109 mmol/L (ref 101–111)
Creatinine, Ser: 0.92 mg/dL (ref 0.44–1.00)
GFR calc Af Amer: >60 mL/min (ref >60)
GLUCOSE: 158 mg/dL — AB (ref 65–99)
Potassium: 5.4 mmol/L — ABNORMAL HIGH (ref 3.5–5.1)
Sodium: 140 mmol/L (ref 135–145)

## 2015-08-10 NOTE — Progress Notes (Signed)
1 Day Post-Op  Subjective: Pain well controlled.  Having nausea.  Objective: Vital signs in last 24 hours: Temp:  [97.5 F (36.4 C)-98.2 F (36.8 C)] 98 F (36.7 C) (09/23 0512) Pulse Rate:  [87-115] 87 (09/23 0512) Resp:  [9-22] 16 (09/23 0512) BP: (115-144)/(61-78) 115/61 mmHg (09/23 0512) SpO2:  [96 %-100 %] 100 % (09/23 0512) Last BM Date: 08/08/15  Intake/Output from previous day: 09/22 0701 - 09/23 0700 In: 4700 [I.V.:4700] Out: 1515 [Urine:835; Drains:280; Blood:400] Intake/Output this shift:    PE: General- In NAD Abdomen-soft, quiet, dried drainage on lower midline dressing, thin bloody drain output  Lab Results:   Recent Labs  08/10/15 0422  WBC 7.6  HGB 8.6*  HCT 28.5*  PLT 287   BMET  Recent Labs  08/10/15 0422  NA 140  K 5.4*  CL 109  CO2 26  GLUCOSE 158*  BUN 6  CREATININE 0.92  CALCIUM 8.5*   PT/INR No results for input(s): LABPROT, INR in the last 72 hours. Comprehensive Metabolic Panel:    Component Value Date/Time   NA 140 08/10/2015 0422   NA 141 08/02/2015 0940   K 5.4* 08/10/2015 0422   K 4.0 08/02/2015 0940   CL 109 08/10/2015 0422   CL 106 08/02/2015 0940   CO2 26 08/10/2015 0422   CO2 24 08/02/2015 0940   BUN 6 08/10/2015 0422   BUN 11 08/02/2015 0940   CREATININE 0.92 08/10/2015 0422   CREATININE 0.94 08/02/2015 0940   GLUCOSE 158* 08/10/2015 0422   GLUCOSE 112* 08/02/2015 0940   CALCIUM 8.5* 08/10/2015 0422   CALCIUM 9.7 08/02/2015 0940   AST 43* 08/02/2015 0940   AST 34 09/30/2010 2126   ALT 17 08/02/2015 0940   ALT 33 09/30/2010 2126   ALKPHOS 83 08/02/2015 0940   ALKPHOS 94 09/30/2010 2126   BILITOT 0.6 08/02/2015 0940   BILITOT 0.5 09/30/2010 2126   PROT 8.1 08/02/2015 0940   PROT 7.2 09/30/2010 2126   ALBUMIN 4.4 08/02/2015 0940   ALBUMIN 4.4 09/30/2010 2126     Studies/Results: No results found.  Anti-infectives: Anti-infectives    Start     Dose/Rate Route Frequency Ordered Stop   08/09/15  1800  cefoTEtan (CEFOTAN) 2 g in dextrose 5 % 50 mL IVPB     2 g 100 mL/hr over 30 Minutes Intravenous Every 12 hours 08/09/15 1443 08/09/15 1819   08/09/15 0557  cefoTEtan in Dextrose 5% (CEFOTAN) IVPB 2 g     2 g Intravenous On call to O.R. 08/09/15 0557 08/09/15 0735      Assessment Principal Problem:   Sigmoid colon cancer s/p lap assisted sigmoid colectomy 08/09/15-stable overnight    ABL anemia    Hyperkalemia-no potassium in IVF    LOS: 1 day   Plan: Keep on ice chips.  OOB.  Recheck lab tomorrow.  Hold [place] Heparin for now as hemoglobin down.   ROSENBOWER,TODD Lenna Sciara 08/10/2015

## 2015-08-11 LAB — CBC
HCT: 26.9 % — ABNORMAL LOW (ref 36.0–46.0)
HEMOGLOBIN: 8 g/dL — AB (ref 12.0–15.0)
MCH: 22.4 pg — AB (ref 26.0–34.0)
MCHC: 29.7 g/dL — ABNORMAL LOW (ref 30.0–36.0)
MCV: 75.4 fL — AB (ref 78.0–100.0)
PLATELETS: 220 10*3/uL (ref 150–400)
RBC: 3.57 MIL/uL — AB (ref 3.87–5.11)
RDW: 23.7 % — ABNORMAL HIGH (ref 11.5–15.5)
WBC: 6.3 10*3/uL (ref 4.0–10.5)

## 2015-08-11 LAB — BASIC METABOLIC PANEL
ANION GAP: 4 — AB (ref 5–15)
BUN: <5 mg/dL — ABNORMAL LOW (ref 6–20)
CALCIUM: 8.4 mg/dL — AB (ref 8.9–10.3)
CHLORIDE: 109 mmol/L (ref 101–111)
CO2: 27 mmol/L (ref 22–32)
CREATININE: 0.65 mg/dL (ref 0.44–1.00)
GFR calc non Af Amer: >60 mL/min (ref >60)
Glucose, Bld: 117 mg/dL — ABNORMAL HIGH (ref 65–99)
Potassium: 3.9 mmol/L (ref 3.5–5.1)
SODIUM: 140 mmol/L (ref 135–145)

## 2015-08-11 NOTE — Progress Notes (Signed)
Patient ID: Nichole Cross, female   DOB: [birth year], 74 y.o.   MRN: [id number]  [facility]  POD#: 2  Subjective: Patient in bed.  Complains of mild pain.  No nausea today.  Has not been OOB yet.  Objective: Vital signs in last 24 hours: Temp:  [97.7 F (36.5 C)-98.3 F (36.8 C)] 98.3 F (36.8 C) (09/24 0500) Pulse Rate:  [76-103] 103 (09/24 0500) Resp:  [13-20] 20 (09/24 0756) BP: (110-130)/(47-58) 130/58 mmHg (09/24 0500) SpO2:  [97 %-100 %] 100 % (09/24 0756) FiO2 (%):  [41 %-48 %] 41 % (09/24 0402) Last BM Date: 08/09/15  Intake/Output from previous day: 09/23 0701 - 09/24 0700 In: 2375.4 [I.V.:2375.4] Out: 1233 [Urine:1125; Drains:108] Intake/Output this shift:    Physical Exam: HEENT - sclerae clear, mucous membranes moist Neck - soft Chest - clear bilaterally Cor - RRR Abdomen - soft, dressings dry and intact; few BS present Ext - no edema, non-tender Neuro - alert & oriented, no focal deficits  Lab Results:   Recent Labs  08/10/15 0422 08/11/15 0415  WBC 7.6 6.3  HGB 8.6* 8.0*  HCT 28.5* 26.9*  PLT 287 220   BMET  Recent Labs  08/10/15 0422 08/11/15 0415  NA 140 140  K 5.4* 3.9  CL 109 109  CO2 26 27  GLUCOSE 158* 117*  BUN 6 <5*  CREATININE 0.92 0.65  CALCIUM 8.5* 8.4*   PT/INR No results for input(s): LABPROT, INR in the last 72 hours. Comprehensive Metabolic Panel:    Component Value Date/Time   NA 140 08/11/2015 0415   NA 140 08/10/2015 0422   K 3.9 08/11/2015 0415   K 5.4* 08/10/2015 0422   CL 109 08/11/2015 0415   CL 109 08/10/2015 0422   CO2 27 08/11/2015 0415   CO2 26 08/10/2015 0422   BUN <5* 08/11/2015 0415   BUN 6 08/10/2015 0422   CREATININE 0.65 08/11/2015 0415   CREATININE 0.92 08/10/2015 0422   GLUCOSE 117* 08/11/2015 0415   GLUCOSE 158* 08/10/2015 0422   CALCIUM 8.4* 08/11/2015 0415   CALCIUM 8.5* 08/10/2015 0422   AST 43* 08/02/2015 0940   AST 34 09/30/2010 2126   ALT  17 08/02/2015 0940   ALT 33 09/30/2010 2126   ALKPHOS 83 08/02/2015 0940   ALKPHOS 94 09/30/2010 2126   BILITOT 0.6 08/02/2015 0940   BILITOT 0.5 09/30/2010 2126   PROT 8.1 08/02/2015 0940   PROT 7.2 09/30/2010 2126   ALBUMIN 4.4 08/02/2015 0940   ALBUMIN 4.4 09/30/2010 2126    Studies/Results: No results found.  Anti-infectives: Anti-infectives    Start     Dose/Rate Route Frequency Ordered Stop   08/09/15 1800  cefoTEtan (CEFOTAN) 2 g in dextrose 5 % 50 mL IVPB     2 g 100 mL/hr over 30 Minutes Intravenous Every 12 hours 08/09/15 1443 08/09/15 1819   08/09/15 0557  cefoTEtan in Dextrose 5% (CEFOTAN) IVPB 2 g     2 g Intravenous On call to O.R. 08/09/15 0557 08/09/15 0735      Assessment & Plans: Status post sigmoid colectomy  Begin clear liquid diet  OOB, ambulate in halls, IS use encouraged  Earnstine Regal, MD, [facility] Office: [place] 08/11/2015

## 2015-08-12 MED ORDER — PANTOPRAZOLE SODIUM 40 MG PO TBEC
40.0000 mg | DELAYED_RELEASE_TABLET | Freq: Every day | ORAL | Status: DC
  Administered 2015-08-12: 40 mg via ORAL
  Filled 2015-08-12 (×2): qty 1

## 2015-08-12 NOTE — Progress Notes (Signed)
PHARMACIST - PHYSICIAN COMMUNICATION DR:   Zella Richer CONCERNING: Proton Pump Inhibitor IV to Oral Route Change Policy  The patient is receiving Protonix by the intravenous route. Based on criteria approved by the Pharmacy and [facility], the medication is being converted to the equivalent oral dose form.   These criteria include:  -No Active GI bleeding  -Able to tolerate diet of full liquids (or better) or tube feeding  -Able to tolerate other medications by the oral or enteral route   If you have any questions about this conversion, please contact the Pharmacy Department (ext [phone]). Thank you.  Peggyann Juba, PharmD, BCPS 08/12/2015 2:05 PM

## 2015-08-12 NOTE — Progress Notes (Signed)
Patient ID: Nichole Cross, female   DOB: Dec 18, 1940, 74 y.o.   MRN: [id number]  [facility]  POD#: 3  Subjective: Patient comfortable, no complaints. Ambulatory.  Doesn't like clear liquids, no nausea.  Having BM's and flatus.  Objective: Vital signs in last 24 hours: Temp:  [97.8 F (36.6 C)-98 F (36.7 C)] 98 F (36.7 C) (09/25 0626) Pulse Rate:  [83-103] 93 (09/25 0626) Resp:  [12-20] 12 (09/25 0800) BP: (138-153)/(59-74) 138/74 mmHg (09/25 0626) SpO2:  [96 %-100 %] 97 % (09/25 0800) Last BM Date: 08/11/15  Intake/Output from previous day: 09/24 0701 - 09/25 0700 In: 1646.7 [P.O.:240; I.V.:1406.7] Out: 2070 [Urine:1950; Drains:120] Intake/Output this shift: Total I/O In: -  Out: 613 [Urine:600; Drains:13]  Physical Exam: HEENT - sclerae clear, mucous membranes moist Neck - soft Chest - clear bilaterally Cor - RRR Abdomen - soft, dressings dry and intact; JP drain with small serosanguinous output Ext - no edema, non-tender Neuro - alert & oriented, no focal deficits  Lab Results:   Recent Labs  08/10/15 0422 08/11/15 0415  WBC 7.6 6.3  HGB 8.6* 8.0*  HCT 28.5* 26.9*  PLT 287 220   BMET  Recent Labs  08/10/15 0422 08/11/15 0415  NA 140 140  K 5.4* 3.9  CL 109 109  CO2 26 27  GLUCOSE 158* 117*  BUN 6 <5*  CREATININE 0.92 0.65  CALCIUM 8.5* 8.4*   PT/INR No results for input(s): LABPROT, INR in the last 72 hours. Comprehensive Metabolic Panel:    Component Value Date/Time   NA 140 08/11/2015 0415   NA 140 08/10/2015 0422   K 3.9 08/11/2015 0415   K 5.4* 08/10/2015 0422   CL 109 08/11/2015 0415   CL 109 08/10/2015 0422   CO2 27 08/11/2015 0415   CO2 26 08/10/2015 0422   BUN <5* 08/11/2015 0415   BUN 6 08/10/2015 0422   CREATININE 0.65 08/11/2015 0415   CREATININE 0.92 08/10/2015 0422   GLUCOSE 117* 08/11/2015 0415   GLUCOSE 158* 08/10/2015 0422   CALCIUM 8.4* 08/11/2015 0415   CALCIUM 8.5*  08/10/2015 0422   AST 43* 08/02/2015 0940   AST 34 09/30/2010 2126   ALT 17 08/02/2015 0940   ALT 33 09/30/2010 2126   ALKPHOS 83 08/02/2015 0940   ALKPHOS 94 09/30/2010 2126   BILITOT 0.6 08/02/2015 0940   BILITOT 0.5 09/30/2010 2126   PROT 8.1 08/02/2015 0940   PROT 7.2 09/30/2010 2126   ALBUMIN 4.4 08/02/2015 0940   ALBUMIN 4.4 09/30/2010 2126    Studies/Results: No results found.  Anti-infectives: Anti-infectives    Start     Dose/Rate Route Frequency Ordered Stop   08/09/15 1800  cefoTEtan (CEFOTAN) 2 g in dextrose 5 % 50 mL IVPB     2 g 100 mL/hr over 30 Minutes Intravenous Every 12 hours 08/09/15 1443 08/09/15 1819   08/09/15 0557  cefoTEtan in Dextrose 5% (CEFOTAN) IVPB 2 g     2 g Intravenous On call to O.R. 08/09/15 0557 08/09/15 0735      Assessment & Plans: Status post sigmoid colectomy Advance to regular diet OOB, ambulate in halls, IS use encouraged  Earnstine Regal, MD, [facility] Office: [place] 08/12/2015

## 2015-08-13 LAB — CBC
HCT: 26.8 % — ABNORMAL LOW (ref 36.0–46.0)
HEMOGLOBIN: 8.3 g/dL — AB (ref 12.0–15.0)
MCH: 22.6 pg — AB (ref 26.0–34.0)
MCHC: 31 g/dL (ref 30.0–36.0)
MCV: 72.8 fL — ABNORMAL LOW (ref 78.0–100.0)
PLATELETS: 268 10*3/uL (ref 150–400)
RBC: 3.68 MIL/uL — AB (ref 3.87–5.11)
RDW: 22.6 % — ABNORMAL HIGH (ref 11.5–15.5)
WBC: 5 10*3/uL (ref 4.0–10.5)

## 2015-08-13 LAB — GLUCOSE, CAPILLARY: Glucose-Capillary: 110 mg/dL — ABNORMAL HIGH (ref 65–99)

## 2015-08-13 MED ORDER — OXYCODONE HCL 5 MG PO TABS
5.0000 mg | ORAL_TABLET | ORAL | Status: DC | PRN
  Administered 2015-08-13 (×2): 10 mg via ORAL
  Filled 2015-08-13 (×2): qty 2

## 2015-08-13 MED ORDER — OXYCODONE HCL 5 MG PO TABS: 5.0000 mg | ORAL_TABLET | ORAL | Status: DC | PRN

## 2015-08-13 NOTE — Progress Notes (Signed)
Patient's vital signs stable, tolerating regular diet, walking in halls. Patient has had BM and is passing gas. Patient's pain is controlled with oral pain meds.. Discharge instructions given to patient and her daughter. Patient verbalizes understanding. Patient discharged to home.

## 2015-08-13 NOTE — Care Management Important Message (Signed)
Important Message  Patient Details  Name: Nichole Cross MRN: [id number] Date of Birth: 07-17-1941   Medicare Important Message Given:  [facility] notification given    Camillo Flaming 08/13/2015, 12:31 [place] Message  Patient Details  Name: Nichole Cross MRN: [id number] Date of Birth: 1941-04-27   Medicare Important Message Given:  Yes-second notification given    Camillo Flaming 08/13/2015, 12:31 PM

## 2015-08-13 NOTE — Discharge Instructions (Signed)
Removal of the Colon (Colectomy) Care After  HOME CARE INSTRUCTIONS  Once home, an ice pack applied to the operative site may help with discomfort and keep swelling down.   Change dressings as directed.   Resume your normal medications.  Only take over-the-counter or prescription medicines for pain, discomfort, or fever as directed by your caregiver.   You may continue normal light diet and activities as directed. Walk!!  Do not drive for at least 2-3 weeks and then only if you are no longer taking pain medicine.  There should be no heavy lifting (more than 10 pounds), strenuous activities or contact sports for at least 6 weeks, and then only after approval of your surgeon.   Keep the wound dry and clean. The wound may be washed gently with soap and water. Gently blot or dab dry following cleansing, without rubbing. Do not take baths, use swimming pools, or use hot tubs for 3 weeks, or as instructed by your caregivers.   If you have a colostomy, care for it as you have been shown.   Call the office [phone]) for a followup appointment, to be seen in 2-3 weeks.  Laxative of choice if needed. SEEK MEDICAL CARE IF (call the office):   There is redness, swelling, or increasing pain in the wound area.   Pus or blood is coming from the wound.   An unexplained oral temperature above 101.5 degrees F develops.   You notice a foul smell coming from the wound or dressing.   There is a breaking open of a wound (edges not staying together) after the sutures have been removed.   There is increasing abdominal pain.   You have persistent vomiting. SEEK IMMEDIATE MEDICAL CARE IF:   A rash develops.   There is difficulty breathing, or development of a reaction or side effects to medications given.  Document Released: 06/03/2004 Document Revised: 07/16/2011 Document Reviewed: 12/07/2007 [facility] Patient Information 2012 [place].

## 2015-08-13 NOTE — Progress Notes (Signed)
4 Days Post-Op  Subjective: Progressing well.  Tolerating diet. No BM yesterday  Objective: Vital signs in last 24 hours: Temp:  [98.3 F (36.8 C)-98.4 F (36.9 C)] 98.3 F (36.8 C) (09/26 0418) Pulse Rate:  [88-102] 88 (09/26 0418) Resp:  [12-20] 14 (09/26 0759) BP: (125-140)/(55-66) 125/55 mmHg (09/26 0418) SpO2:  [95 %-100 %] 96 % (09/26 0759) Last BM Date: 08/11/15  Intake/Output from previous day: 09/25 0701 - 09/26 0700 In: 1920 [P.O.:720; I.V.:1200] Out: 2223 [Urine:2100; Drains:123] Intake/Output this shift:    PE: General- In NAD Abdomen-soft, incisions are clean and intact, serosanguinous drain output Lab Results:   Recent Labs  08/11/15 0415  WBC 6.3  HGB 8.0*  HCT 26.9*  PLT 220   BMET  Recent Labs  08/11/15 0415  NA 140  K 3.9  CL 109  CO2 27  GLUCOSE 117*  BUN <5*  CREATININE 0.65  CALCIUM 8.4*   PT/INR No results for input(s): LABPROT, INR in the last 72 hours. Comprehensive Metabolic Panel:    Component Value Date/Time   NA 140 08/11/2015 0415   NA 140 08/10/2015 0422   K 3.9 08/11/2015 0415   K 5.4* 08/10/2015 0422   CL 109 08/11/2015 0415   CL 109 08/10/2015 0422   CO2 27 08/11/2015 0415   CO2 26 08/10/2015 0422   BUN <5* 08/11/2015 0415   BUN 6 08/10/2015 0422   CREATININE 0.65 08/11/2015 0415   CREATININE 0.92 08/10/2015 0422   GLUCOSE 117* 08/11/2015 0415   GLUCOSE 158* 08/10/2015 0422   CALCIUM 8.4* 08/11/2015 0415   CALCIUM 8.5* 08/10/2015 0422   AST 43* 08/02/2015 0940   AST 34 09/30/2010 2126   ALT 17 08/02/2015 0940   ALT 33 09/30/2010 2126   ALKPHOS 83 08/02/2015 0940   ALKPHOS 94 09/30/2010 2126   BILITOT 0.6 08/02/2015 0940   BILITOT 0.5 09/30/2010 2126   PROT 8.1 08/02/2015 0940   PROT 7.2 09/30/2010 2126   ALBUMIN 4.4 08/02/2015 0940   ALBUMIN 4.4 09/30/2010 2126     Studies/Results: No results found.  Anti-infectives: Anti-infectives    Start     Dose/Rate Route Frequency Ordered Stop   08/09/15 1800  cefoTEtan (CEFOTAN) 2 g in dextrose 5 % 50 mL IVPB     2 g 100 mL/hr over 30 Minutes Intravenous Every 12 hours 08/09/15 1443 08/09/15 1819   08/09/15 0557  cefoTEtan in Dextrose 5% (CEFOTAN) IVPB 2 g     2 g Intravenous On call to O.R. 08/09/15 0557 08/09/15 0735      Assessment Principal Problem:   Sigmoid colon cancer s/p lap assisted sigmoid colectomy 08/09/15-progressing well;pathology T3N0, grade 3.    ABL anemia        LOS: 4 days   Plan: Stop PCA.  Oral analgesic.  Heplock IV.  Remove drain.  Hopefully home later today or tomorrow.   ROSENBOWER,TODD Lenna Sciara 08/13/2015

## 2015-08-16 NOTE — Discharge Summary (Signed)
Physician Discharge Summary  Patient ID: JOHNANNA Cross MRN: [id number] DOB/AGE: 07-24-1941 74 y.o.  Admit date: 08/09/2015 Discharge date: 08/16/2015  Admission Diagnoses:  Sigmoid colon cancer  Discharge Diagnoses:  Principal Problem:   T3N0 sigmoid colon cancer s/p lap assisted sigmoid colectomy 08/09/15   Discharged Condition: good  Hospital Course: She underwent the above procedure and tolerated it well.  She progressed well on the postop colorectal surgery pathway and was able to be discharged on POD #4.  Discharge instructions were given to her.  Discharge Exam: Blood pressure 132/70, pulse 99, temperature 98.3 F (36.8 C), temperature source Oral, resp. rate 16, height 5\' 9"  (1.753 m), weight 76.658 kg (169 lb), SpO2 99 %.   Disposition: 01-Home or Self Care     Medication List    STOP taking these medications        HYDROcodone-acetaminophen 7.5-325 MG tablet  Commonly known as:  NORCO     polyethylene glycol packet  Commonly known as:  MIRALAX / GLYCOLAX      TAKE these medications        albuterol 108 (90 BASE) MCG/ACT inhaler  Commonly known as:  PROVENTIL HFA;VENTOLIN HFA  Inhale 1 puff into the lungs every 6 (six) hours as needed for wheezing or shortness of breath.     alendronate 70 MG tablet  Commonly known as:  FOSAMAX  Take 70 mg by mouth every 7 (seven) days. Take with a full glass of water on an empty stomach. Sunday     aspirin EC 325 MG tablet  Take 1 tablet (325 mg total) by mouth 2 (two) times daily. X 4 weeks     diphenhydrAMINE 25 mg capsule  Commonly known as:  BENADRYL  Take 1 capsule (25 mg total) by mouth every 6 (six) hours as needed for itching, allergies or sleep.     DSS 100 MG Caps  Take 100 mg by mouth 2 (two) times daily.     ferrous sulfate 325 (65 FE) MG tablet  Take 1 tablet (325 mg total) by mouth 3 (three) times daily after meals.     levothyroxine 137 MCG tablet  Commonly known as:  SYNTHROID, LEVOTHROID  Take  137 mcg by mouth daily before breakfast.     methocarbamol 500 MG tablet  Commonly known as:  ROBAXIN  Take 1 tablet (500 mg total) by mouth every 6 (six) hours as needed (muscle spasms).     oxyCODONE 5 MG immediate release tablet  Commonly known as:  Oxy IR/ROXICODONE  Take 1-2 tablets (5-10 mg total) by mouth every 4 (four) hours as needed for moderate pain.     promethazine 12.5 MG tablet  Commonly known as:  PHENERGAN  Take 1 tablet (12.5 mg total) by mouth every 6 (six) hours as needed for nausea.     rivaroxaban 10 MG Tabs tablet  Commonly known as:  XARELTO  Take 1 tablet (10 mg total) by mouth daily.         Signed: Odis Hollingshead 08/16/2015, 10:30 AM

## 2015-08-29 ENCOUNTER — Telehealth: Payer: Self-pay | Admitting: Oncology

## 2015-08-29 NOTE — Telephone Encounter (Signed)
Called patient and requested she obtain her CT scan from [facility] on CD and bring to her office visit on 09/03/15. She will have her PCP do this for her.

## 2015-08-29 NOTE — Telephone Encounter (Signed)
New patient appt-s/w patient and gave np appt for 10/17 @ 1:45 w/Dr. Benay Spice. Referring Dr. Zella Richer Dx- Malignant neoplasm of sigmoid colon

## 2015-09-03 ENCOUNTER — Telehealth: Payer: Self-pay | Admitting: Oncology

## 2015-09-03 ENCOUNTER — Ambulatory Visit (HOSPITAL_BASED_OUTPATIENT_CLINIC_OR_DEPARTMENT_OTHER): Payer: Medicare Other

## 2015-09-03 ENCOUNTER — Ambulatory Visit (HOSPITAL_BASED_OUTPATIENT_CLINIC_OR_DEPARTMENT_OTHER): Payer: Medicare Other | Admitting: Oncology

## 2015-09-03 ENCOUNTER — Encounter: Payer: Self-pay | Admitting: *Deleted

## 2015-09-03 VITALS — BP 138/73 | HR 109 | Temp 97.9°F | Resp 18 | Ht 69.0 in | Wt 164.3 lb

## 2015-09-03 DIAGNOSIS — D509 Iron deficiency anemia, unspecified: Secondary | ICD-10-CM

## 2015-09-03 DIAGNOSIS — C187 Malignant neoplasm of sigmoid colon: Secondary | ICD-10-CM

## 2015-09-03 DIAGNOSIS — R970 Elevated carcinoembryonic antigen [CEA]: Secondary | ICD-10-CM

## 2015-09-03 LAB — CBC WITH DIFFERENTIAL/PLATELET
BASO%: 2.1 % — ABNORMAL HIGH (ref 0.0–2.0)
BASOS ABS: 0.1 10*3/uL (ref 0.0–0.1)
EOS%: 1.6 % (ref 0.0–7.0)
Eosinophils Absolute: 0.1 10*3/uL (ref 0.0–0.5)
HEMATOCRIT: 34.2 % — AB (ref 34.8–46.6)
HEMOGLOBIN: 10.3 g/dL — AB (ref 11.6–15.9)
LYMPH#: 1 10*3/uL (ref 0.9–3.3)
LYMPH%: 25.8 % (ref 14.0–49.7)
MCH: 21.8 pg — AB (ref 25.1–34.0)
MCHC: 30.3 g/dL — ABNORMAL LOW (ref 31.5–36.0)
MCV: 72 fL — ABNORMAL LOW (ref 79.5–101.0)
MONO#: 0.4 10*3/uL (ref 0.1–0.9)
MONO%: 11 % (ref 0.0–14.0)
NEUT%: 59.5 % (ref 38.4–76.8)
NEUTROS ABS: 2.3 10*3/uL (ref 1.5–6.5)
Platelets: 432 10*3/uL — ABNORMAL HIGH (ref 145–400)
RBC: 4.75 10*6/uL (ref 3.70–5.45)
RDW: 22.4 % — AB (ref 11.2–14.5)
WBC: 3.8 10*3/uL — AB (ref 3.9–10.3)

## 2015-09-03 LAB — TECHNOLOGIST REVIEW

## 2015-09-03 NOTE — Progress Notes (Signed)
[name] Consult   Referring MD: Anamari Galeas Castilleja 74 y.o.  August 02, 1941    Reason for Referral: Colon cancer   HPI: She reports a history of abdominal pain after meals, diarrhea, and anorexia for several months. She reports the symptoms did not improve with treatment for diverticulitis. She was referred for a CT of the abdomen and pelvis at [facility] on 06/22/2015. No liver masses were identified. No pancreatic mass or inflammatory change. Short segment concentric wall thickening was noted in the colon near the rectosigmoid junction. There was soft tissue stranding in the adjacent fat with mild lymphadenopathy in the sigmoid mesocolon. The findings were suspicious for colon cancer with local lymph node metastases. No bowel obstruction.  She was taken to a colonoscopy by Dr. Rolm Bookbinder on 07/06/2015. The colonic scope could not be advanced beyond the sigmoid colon secondary to an obstructing mass. Biopsies were obtained. The mass was at 25 cm. Tattoo ink was placed distal to the mass. The pathology revealed invasive adenocarcinoma.  She was referred to Dr. Zella Richer and was taken the operating room on 08/09/2015 for a laparoscopic-assisted sigmoid colectomy. Multiple adhesions were noted. No liver lesions. The tumor was "stuck" to the left lower pelvic sidewall. The tumor was mobilized from the pelvic sidewall in the area was marked with a Hemoclip.  The pathology [id number]) confirmed an invasive adenocarcinoma extending into subserosal connective tissue. The resection margins were negative. There was a separate tubulovillous adenoma. 13 lymph nodes were negative for metastatic carcinoma. Lymphovascular and perineural invasion were not identified. No macroscopic tumor perforation. No tumor deposits. Mismatch repair protein expression returned normal.  She reports the abdominal pain and diarrhea have resolved following surgery. She now has  constipation.  Past Medical History  Diagnosis Date  . Headache(784.0)     migraines  . Arthritis     knees  .  G3 P3    .        Marland Kitchen Colon cancer ([place]) (T3 N0) sigmoid colon   08/09/2015   . Anemia-microcytic    . Hyperthyroidism     Past Surgical History  Procedure Laterality Date  . Abdominal tumor  1993  . Abdominal hysterectomy  1993  . Partial knee arthroplasty  09/09/2012    Procedure: UNICOMPARTMENTAL KNEE;  Surgeon: Mauri Pole, MD;  Location: [facility];  Service: Orthopedics;  Laterality: Left;  . Laparoscopic partial colectomy N/A 08/09/2015    Procedure: LAPAROSCOPIC PARTIAL COLECTOMY;  Surgeon: Jackolyn Confer, MD;  Location: [facility];  Service: General;  Laterality: N/A;    Medications: Reviewed  Allergies:  Allergies  Allergen Reactions  . Aspirin     REACTION: itching    Family history: Her sister had breast cancer. Her mother had pancreatic cancer. No other family history of cancer.   Social History:   She lives in [place]. 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 She does not use tobacco or alcohol. No risk factor for HIV or hepatitis.   ROS:   Positives include: Anorexia, 25 pound weight loss, nausea and rectal bleeding prior to surgery, constipation following surgery, decreased visual acuity, episode of abdominal pain a few evenings ago  A complete ROS was otherwise negative.  Physical Exam:  Blood pressure 138/73, pulse 109, temperature 97.9 F (36.6 C), temperature source Oral, resp. rate 18, height '5\' 9"'  (1.753 m), weight 164 lb 4.8 oz (74.526 kg), SpO2 100 %.  HEENT: Upper and lower denture plate, oropharynx without visible mass, neck  without mass Lungs: Bilaterally Cardiac: Regular rate and rhythm Abdomen: No hepatosplenomegaly, healed surgical incision, no mass, nontender  Vascular: No leg edema Lymph nodes: No cervical, supraclavicular, axillary, or inguinal nodes Neurologic: Alert and oriented, the motor exam appears intact in the upper and  lower extremities Skin: No rash Musculoskeletal: No spine tenderness   LAB:  CBC  Lab Results  Component Value Date   WBC 3.8* 09/03/2015   HGB 10.3* 09/03/2015   HCT 34.2* 09/03/2015   MCV 72.0* 09/03/2015   PLT 432* 09/03/2015   NEUTROABS 2.3 09/03/2015     CMP      Component Value Date/Time   NA 140 08/11/2015 0415   K 3.9 08/11/2015 0415   CL 109 08/11/2015 0415   CO2 27 08/11/2015 0415   GLUCOSE 117* 08/11/2015 0415   BUN <5* 08/11/2015 0415   CREATININE 0.65 08/11/2015 0415   CALCIUM 8.4* 08/11/2015 0415   PROT 8.1 08/02/2015 0940   ALBUMIN 4.4 08/02/2015 0940   AST 43* 08/02/2015 0940   ALT 17 08/02/2015 0940   ALKPHOS 83 08/02/2015 0940   BILITOT 0.6 08/02/2015 0940   GFRNONAA >60 08/11/2015 0415   GFRAA >60 08/11/2015 0415    Lab Results  Component Value Date   CEA 13.3* 08/02/2015    Imaging:  As per history of present illness, chest x-ray 08/02/2015 with no active cardiopulmonary disease   Assessment/Plan:   1. Adenocarcinoma the distal sigmoid colon, poorly differentiated, stage II (T3 N0), status post a laparoscopic assisted sigmoid colectomy 08/09/2015  No loss of mismatch repair protein expression  Elevated preoperative CEA  Staging CT of the abdomen and pelvis 06/22/2015 with no evidence of distant metastatic disease   2.   Microcytic anemia-likely iron deficiency anemia secondary to colon cancer, improved following discharge from the hospital  3.   Tubular villous adenoma on the sigmoid colon resection specimen 08/09/2015    Disposition:   Nichole Cross has been diagnosed with adenocarcinoma of the sigmoid colon. We discussed the details of the surgical pathology report, prognosis, and indication for adjuvant therapy. I explained the high chance of a long-term disease-free survival in patients with resected stage II colon cancer. Her tumor does not have high-risk features such as a bowel perforation, markedly elevated CEA, limited  lymph node sampling, or lymphovascular invasion. There is a small absolute potential benefit from adjuvant chemotherapy in her case. I do not recommend adjuvant chemotherapy.  She does not appear to have hereditary non-polyposis colon cancer syndrome, but her family members remain at increased risk of developing colon cancer. I recommend they obtain screening colonoscopies.  She did not have a complete colonoscopy secondary to the obstructing sigmoid colon tumor. We will refer her for a colonoscopy in the next 3-6 months. She requested a referral to [facility] gastro[facility]ology.  She had an elevated preoperative CEA. I recommend a CEA be checked every 6 months for 3 years and then yearly up to the five-year point. The microcytic anemia appears to be correcting with iron therapy and can be followed by Dr. Jobe Igo.  Ms. Flatley would like to continue clinical follow-up with Dr. Jobe Igo. She is not scheduled for a follow-up appointment at the [facility]. I will be glad to see her in the future as needed.  I will contact Dr. Zella Richer to be sure he agrees the tumor is based in the distal sigmoid colon and to discuss the surgical margins. Her case will be presented at the GI tumor conference.  Oneill Bais 09/03/2015, 5:08 PM

## 2015-09-03 NOTE — Telephone Encounter (Signed)
Gave and pritned avs for pt.Marland KitchenMarland Kitchen

## 2015-09-03 NOTE — Progress Notes (Signed)
Oncology Nurse Navigator Documentation  Oncology Nurse Navigator Flowsheets 09/03/2015  Referral date to RadOnc/MedOnc 08/28/2015  Navigator Encounter Type Initial MedOnc  Patient Visit Type Medonc  Treatment Phase S/p surgery for colon cancer  Barriers/Navigation Needs Family concerns  Interventions Education Method  Education Method Verbal;Written;Teach-back  Support Groups/Services GI & ACS for [place]  Time Spent with Patient 40  Met with patient and daughter, Nichole Cross during new patient visit. Explained the role of the GI Nurse Navigator and provided New Patient Packet with information on: 1. Colon cancer & CEA level  2. Support groups 3. Advanced Directives 4. Fall Safety Plan Answered questions, reviewed current treatment plan using TEACH back and provided emotional support. Provided copy of current treatment plan. Informed her to continue her iron until MD feels not necessary. Will check labs today and forward to PCP. Suggested she increase her stool soften to tid with her iron.  Merceda Elks, RN, BSN GI Oncology [place]

## 2015-09-04 ENCOUNTER — Telehealth: Payer: Self-pay | Admitting: *Deleted

## 2015-09-04 NOTE — Telephone Encounter (Signed)
-----   Message from Ladell Pier, MD sent at 09/04/2015  8:53 AM EDT ----- Please call patient, cea is now normal and hb is better, copy result to Dr. Lysle Pearl

## 2015-09-04 NOTE — Telephone Encounter (Signed)
Email request to [facility]ology for MSI testing on following case at Dr. Gearldine Shown request:  Patient: Nichole Cross Collected: 08/09/2015 Client: [facility] Accession: [id number] Received: 08/09/2015 Odis Hollingshead, MD DOB: 05-26-1941 Age: 74 Gender: F Reported: 08/10/2015 [address] Patient Ph: [phone] MRN #: [id number] [place], [place] [address]  Faxed office note and labs to Dr. Lysle Pearl with note: check CEA every 6 months X 3 years, then every year X 2. Needs repeat colonoscopy in 3-6 months per Dr. Hilarie Fredrickson (per Dr. Benay Spice). R   Oncology Nurse Navigator Documentation    Navigator Encounter Type: Telephone (09/04/15 6286) :     Barriers/Navigation Needs: Education (09/04/15 0948) Education: Other (09/04/15 3817) : Made her aware of CEA results and these have been faxed to her PCP. Also wanted her aware that since the scope on her colonoscopy did not go past rectosigmoid junction, she needs a colonoscopy in 3-6 months instead of waiting 1 year. She understands and agrees. Interventions: Coordination of Care (09/04/15 0948): EPIC email to Dr. Hilarie Fredrickson regarding need for colonoscopy sooner than 1 year.   Coordination of Care: MD Appointments (09/04/15 0948) Education Method: Verbal (09/04/15 7116)      Time Spent with Patient: 15 (09/04/15 0948)   EPORT OF SURGICAL PATHOLOGY

## 2015-09-04 NOTE — Telephone Encounter (Signed)
Called pt with lab results: CEA is now normal and HGB is better. Labs forwarded to Dr. Jobe Igo. Pt voiced understanding.

## 2015-09-05 ENCOUNTER — Other Ambulatory Visit (HOSPITAL_COMMUNITY)
Admission: RE | Admit: 2015-09-05 | Discharge: 2015-09-05 | Disposition: A | Discharge status: Home / Self Care | Payer: Medicare Other | Source: Ambulatory Visit | Attending: Oncology | Admitting: Oncology

## 2015-09-05 DIAGNOSIS — C189 Malignant neoplasm of colon, unspecified: Secondary | ICD-10-CM | POA: Insufficient documentation

## 2015-09-07 ENCOUNTER — Telehealth: Payer: Self-pay

## 2015-09-07 NOTE — Telephone Encounter (Signed)
-----   Message from Jerene Bears, MD sent at 09/04/2015 10:42 AM EDT ----- Regarding: RE: Referral Agree would recommend full colon at about the 3 month mark from her colon cancer surgery. Thanks JMP  ----- Message -----    From: Algernon Huxley, RN    Sent: 09/04/2015  10:23 AM      To: Jerene Bears, MD Subject: FW: Referral                                   FYI ----- Message -----    From: Tania Ade, RN    Sent: 09/04/2015   9:58 AM      To: Algernon Huxley, RN Subject: Referral                                       Vaughan Basta, [facility] referral sent yesterday for this patient to see Dr. Hilarie Fredrickson for colonoscopy in 1 year. I faxed her colonoscopy report from the GI doc in [facility] (she wants to change doctors to [place]). When he scoped her he only got as far as rectosigmoid junction, so Dr. Benay Spice thinks she needs colonoscopy in 3-6 months instead. I just faxed the report to [phone] (has not been scanned yet). Thanks, [place]

## 2015-09-07 NOTE — Telephone Encounter (Signed)
Pt scheduled for previsit 10/29/15@10am , scheduled for colon 11/16/15@10am . Pt aware of appts.

## 2015-09-11 ENCOUNTER — Encounter (HOSPITAL_COMMUNITY): Payer: Self-pay

## 2015-10-29 ENCOUNTER — Ambulatory Visit (AMBULATORY_SURGERY_CENTER): Payer: Self-pay

## 2015-10-29 VITALS — Ht 69.0 in | Wt 167.2 lb

## 2015-10-29 DIAGNOSIS — C187 Malignant neoplasm of sigmoid colon: Secondary | ICD-10-CM

## 2015-10-29 MED ORDER — SUPREP BOWEL PREP KIT 17.5-3.13-1.6 GM/177ML PO SOLN: 1.0000 | Freq: Once | ORAL | Status: DC

## 2015-10-29 NOTE — Progress Notes (Signed)
No allergies to eggs or soy or flu shot No diet/weight loss meds No home oxygen PONV with general anesthesia  Has email and internet; refused emmi

## 2015-11-02 ENCOUNTER — Telehealth: Payer: Self-pay | Admitting: Internal Medicine

## 2015-11-02 NOTE — Telephone Encounter (Signed)
Pt states her prep is 85$ and she cannot afford.  Instructed her to pick up sample prep 4 th floor  Medication Samples have been provided to the patient.  Drug name: suprep  Qty: 1  LOTSO:[id number]  Exp.Date: 11-18  The patient has been instructed regarding the correct time, dose, and frequency of taking this medication, including desired effects and most common side effects.   Nichole Cross 10:12 AM 11/02/2015

## 2015-11-02 NOTE — Telephone Encounter (Signed)
[id number] am returned call and no answer. [place]. Lelan Pons PV

## 2015-11-16 ENCOUNTER — Ambulatory Visit (AMBULATORY_SURGERY_CENTER): Payer: Medicare Other | Admitting: Internal Medicine

## 2015-11-16 ENCOUNTER — Encounter: Payer: Self-pay | Admitting: Internal Medicine

## 2015-11-16 VITALS — BP 100/70 | HR 66 | Temp 97.0°F | Resp 17 | Ht 69.0 in | Wt 167.0 lb

## 2015-11-16 DIAGNOSIS — C187 Malignant neoplasm of sigmoid colon: Secondary | ICD-10-CM

## 2015-11-16 DIAGNOSIS — D120 Benign neoplasm of cecum: Secondary | ICD-10-CM

## 2015-11-16 DIAGNOSIS — D124 Benign neoplasm of descending colon: Secondary | ICD-10-CM | POA: Diagnosis not present

## 2015-11-16 DIAGNOSIS — D123 Benign neoplasm of transverse colon: Secondary | ICD-10-CM

## 2015-11-16 DIAGNOSIS — D122 Benign neoplasm of ascending colon: Secondary | ICD-10-CM

## 2015-11-16 MED ORDER — SODIUM CHLORIDE 0.9 % IV SOLN: 500.0000 mL | INTRAVENOUS | Status: DC

## 2015-11-16 NOTE — Progress Notes (Signed)
Called to room to assist during endoscopic procedure.  Patient ID and intended procedure confirmed with present staff. Received instructions for my participation in the procedure from the performing physician.  

## 2015-11-16 NOTE — Progress Notes (Signed)
Patient awakening,vss,report to rn 

## 2015-11-16 NOTE — Op Note (Signed)
[facility]  [address] [place], [id number]   COLONOSCOPY PROCEDURE REPORT  PATIENT: Nichole Cross, Nichole Cross  MR#: [id number] BIRTHDATE: [birth year] , 74  yrs. old GENDER: female ENDOSCOPIST: Jerene Bears, MD REFERRED Griffith:[id number] Edrick Kins, M.D. PROCEDURE DATE:  11/16/2015 PROCEDURE:   Colonoscopy, surveillance and Colonoscopy with snare polypectomy First Screening Colonoscopy - Avg.  risk and is 50 yrs.  old or older - No.  Prior Negative Screening - Now for repeat screening. N/A  History of Adenoma - Now for follow-up colonoscopy & has been > or = to 3 yrs.  No.  It has been less than 3 yrs since last colonoscopy.  Medical reason.  Polyps removed today? Yes ASA CLASS:   Class III INDICATIONS:Surveillance due to prior colonic neoplasia and PH obstructing sigmoid cancer status post sigmoidectomy, incomplete colonoscopy in August 2016 secondary to sigmoid malignancy; remote previous colonoscopy MEDICATIONS: Monitored anesthesia care and Propofol 300 mg IV  DESCRIPTION OF PROCEDURE:   After the risks benefits and alternatives of the procedure were thoroughly explained, informed consent was obtained.  The digital rectal exam revealed no rectal mass.   The LB [id number]-H190 [id number]  endoscope was introduced through the anus and advanced to the cecum, which was identified by both the appendix and ileocecal valve. No adverse events experienced. The quality of the prep was good.  (Suprep was used)  The instrument was then slowly withdrawn as the colon was fully examined. Estimated blood loss is zero unless otherwise noted in this procedure report.      COLON FINDINGS: Two sessile polyps ranging from 3 and 59mm in size were found at the cecum.  Polypectomies were performed with a cold snare and with cold forceps.  The resection was complete, the polyp tissue was completely retrieved and sent to histology.   Three sessile polyps ranging from 4 to 33mm in size were found in  the ascending colon, transverse colon, and descending colon. Polypectomies were performed with a cold snare.  The resection was complete, the polyp tissue was completely retrieved and sent to histology.   There was mild diverticulosis noted in the descending colon.   There was evidence of a slightly erythematous appearing prior surgical anastomosis in the rectosigmoid colon which is patent.  Retroflexed views revealed no abnormalities. The time to cecum = 1.7 Withdrawal time = 18.8   The scope was withdrawn and the procedure completed. COMPLICATIONS: There were no immediate complications.  ENDOSCOPIC IMPRESSION: 1.   Two sessile polyps ranging from 3 and 56mm in size were found at the cecum; polypectomies were performed with a cold snare and with cold forceps 2.   Three sessile polyps ranging from 4 to 11mm in size were found in the ascending colon, transverse colon, and descending colon; polypectomies were performed with a cold snare 3.   Mild diverticulosis was noted in the descending colon 4.   There was evidence of prior surgical anastomosis in the rectosigmoid colon  RECOMMENDATIONS: 1.  Await pathology results 2.  Repeat Colonoscopy in 1 year. 3.  You will receive a letter within 1-2 weeks with the results of your biopsy as well as final recommendations.  Please call my office if you have not received a letter after 3 weeks.  eSigned:  Jerene Bears, MD 11/16/2015 10:47 AM   cc: Kavin Leech, MD, The Patient, and Lysle Pearl, MD   PATIENT NAME:  Nichole Cross, Nichole Cross MR#: [id number]

## 2015-11-16 NOTE — Patient Instructions (Signed)
Discharge instructions given. Handouts on polyps and diverticulosis. Resume previous medications. YOU HAD AN ENDOSCOPIC PROCEDURE TODAY AT THE [facility]:   Refer to the procedure report that was given to you for any specific questions about what was found during the examination.  If the procedure report does not answer your questions, please call your gastroenterologist to clarify.  If you requested that your care partner not be given the details of your procedure findings, then the procedure report has been included in a sealed envelope for you to review at your convenience later.  YOU SHOULD EXPECT: Some feelings of bloating in the abdomen. Passage of more gas than usual.  Walking can help get rid of the air that was put into your GI tract during the procedure and reduce the bloating. If you had a lower endoscopy (such as a colonoscopy or flexible sigmoidoscopy) you may notice spotting of blood in your stool or on the toilet paper. If you underwent a bowel prep for your procedure, you may not have a normal bowel movement for a few days.  Please Note:  You might notice some irritation and congestion in your nose or some drainage.  This is from the oxygen used during your procedure.  There is no need for concern and it should clear up in a day or so.  SYMPTOMS TO REPORT IMMEDIATELY:   Following lower endoscopy (colonoscopy or flexible sigmoidoscopy):  Excessive amounts of blood in the stool  Significant tenderness or worsening of abdominal pains  Swelling of the abdomen that is new, acute  Fever of 100F or higher   For urgent or emergent issues, a gastroenterologist can be reached at any hour by calling [phone].   DIET: Your first meal following the procedure should be a small meal and then it is ok to progress to your normal diet. Heavy or fried foods are harder to digest and may make you feel nauseous or bloated.  Likewise, meals heavy in dairy and vegetables can  increase bloating.  Drink plenty of fluids but you should avoid alcoholic beverages for 24 hours.  ACTIVITY:  You should plan to take it easy for the rest of today and you should NOT DRIVE or use heavy machinery until tomorrow (because of the sedation medicines used during the test).    FOLLOW UP: Our staff will call the number listed on your records the next business day following your procedure to check on you and address any questions or concerns that you may have regarding the information given to you following your procedure. If we do not reach you, we will leave a message.  However, if you are feeling well and you are not experiencing any problems, there is no need to return our call.  We will assume that you have returned to your regular daily activities without incident.  If any biopsies were taken you will be contacted by phone or by letter within the next 1-3 weeks.  Please call us at [phone] if you have not heard about the biopsies in 3 weeks.    SIGNATURES/CONFIDENTIALITY: You and/or your care partner have signed paperwork which will be entered into your electronic medical record.  These signatures attest to the fact that that the information above on your After Visit Summary has been reviewed and is understood.  Full responsibility of the confidentiality of this discharge information lies with you and/or your care-partner. 

## 2015-11-20 ENCOUNTER — Telehealth: Payer: Self-pay

## 2015-11-20 NOTE — Telephone Encounter (Signed)
  Follow up Call-  Call back number 11/16/2015  Post procedure Call Back phone  # [phone]   Permission to leave phone message Yes     Patient was called for follow up after her procedure on 11/16/2015. No answer at number given. Left a message on voicemail.

## 2015-11-22 ENCOUNTER — Encounter: Payer: Self-pay | Admitting: Internal Medicine

## 2016-01-17 ENCOUNTER — Telehealth: Payer: Self-pay | Admitting: *Deleted

## 2016-01-17 ENCOUNTER — Telehealth: Payer: Self-pay | Admitting: Oncology

## 2016-01-17 NOTE — Telephone Encounter (Signed)
Received labs from Dr. Ronnell Freshwater office. Per Dr. Benay Spice: Has iron deficiency anemia. Needs repeat CEA in one month, and follow up here or with Dr. Jobe Igo.  Called Dr. Ronnell Freshwater office, spoke with Rip Harbour RN who reports MD is leaving the practice and pt has been sent a letter to assign her to a new PCP. Per Rip Harbour, pt will be seeing Dr. Gerarda Fraction. They will bring her in with APP next week to evaluate anemia. Informed Melinda of Dr. Gearldine Shown recommendation to repeat CEA in one month. Left message on voicemail for pt to call office. Would she like to follow up in this office?

## 2016-01-17 NOTE — Telephone Encounter (Signed)
lvm for pt regarding April appt

## 2016-02-21 ENCOUNTER — Telehealth: Payer: Self-pay | Admitting: Oncology

## 2016-02-21 ENCOUNTER — Other Ambulatory Visit: Payer: Medicare Other

## 2016-02-21 ENCOUNTER — Ambulatory Visit (HOSPITAL_BASED_OUTPATIENT_CLINIC_OR_DEPARTMENT_OTHER): Payer: Medicare Other

## 2016-02-21 ENCOUNTER — Ambulatory Visit (HOSPITAL_BASED_OUTPATIENT_CLINIC_OR_DEPARTMENT_OTHER): Payer: Medicare Other | Admitting: Oncology

## 2016-02-21 VITALS — BP 145/71 | HR 69 | Temp 97.5°F | Resp 18 | Ht 69.0 in | Wt 167.7 lb

## 2016-02-21 DIAGNOSIS — C187 Malignant neoplasm of sigmoid colon: Secondary | ICD-10-CM

## 2016-02-21 DIAGNOSIS — Z85038 Personal history of other malignant neoplasm of large intestine: Secondary | ICD-10-CM

## 2016-02-21 DIAGNOSIS — D509 Iron deficiency anemia, unspecified: Secondary | ICD-10-CM | POA: Diagnosis not present

## 2016-02-21 LAB — CBC WITH DIFFERENTIAL/PLATELET
BASO%: 1.2 % (ref 0.0–2.0)
Basophils Absolute: 0 10*3/uL (ref 0.0–0.1)
EOS ABS: 0.1 10*3/uL (ref 0.0–0.5)
EOS%: 2 % (ref 0.0–7.0)
HCT: 42.4 % (ref 34.8–46.6)
HGB: 13.4 g/dL (ref 11.6–15.9)
LYMPH%: 24.9 % (ref 14.0–49.7)
MCH: 23.9 pg — ABNORMAL LOW (ref 25.1–34.0)
MCHC: 31.6 g/dL (ref 31.5–36.0)
MCV: 75.5 fL — AB (ref 79.5–101.0)
MONO#: 0.3 10*3/uL (ref 0.1–0.9)
MONO%: 7.7 % (ref 0.0–14.0)
NEUT%: 64.2 % (ref 38.4–76.8)
NEUTROS ABS: 2.4 10*3/uL (ref 1.5–6.5)
Platelets: 218 10*3/uL (ref 145–400)
RBC: 5.62 10*6/uL — ABNORMAL HIGH (ref 3.70–5.45)
RDW: 21.5 % — AB (ref 11.2–14.5)
WBC: 3.7 10*3/uL — AB (ref 3.9–10.3)
lymph#: 0.9 10*3/uL (ref 0.9–3.3)

## 2016-02-21 LAB — FERRITIN: FERRITIN: 25 ng/mL (ref 9–269)

## 2016-02-21 NOTE — Telephone Encounter (Signed)
Gave and printed appt sched and avs fo rpt for OCT °

## 2016-02-21 NOTE — Progress Notes (Signed)
  [place] OFFICE PROGRESS NOTE   Diagnosis: Colon cancer  INTERVAL HISTORY:   Nichole Cross saw Dr. Kathrynn Humble in February. A CBC returned with a hematoma that 11.5 and an MCV of 70.8. She continues iron 3 times per day. The CEA returned mildly elevated at 5.9 on 01/10/2016.  She feels well. She is noted discomfort in the left lower abdomen for the past 2 weeks. She saw Dr. Zella Richer and reports he noted nodularity to the left of the midline incision. She is scheduled for a three-month follow-up.  A colonoscopy 11/16/2015 revealed multiple polyps. The polyps were removed and the pathology revealed 5 tubular adenomas.    Objective:  Vital signs in last 24 hours:  Blood pressure 145/71, pulse 69, temperature 97.5 F (36.4 C), temperature source Oral, resp. rate 18, height '5\' 9"'$  (1.753 m), weight 167 lb 11.2 oz (76.068 kg), SpO2 98 %.    HEENT: Neck without mass Lymphatics: No cervical, supraclavicular, or inguinal nodes. "Shotty "bilateral axillary nodes. Resp: Lungs clear bilaterally Cardio: Regular rate and rhythm GI: The abdomen is soft and nontender, no hepatomegaly, no mass, there is irregularity in the subcutaneous cutaneous tissue to the left of the midline scar. I cannot palpate a discrete nodule. Vascular: No leg edema   Lab Results:  Lab Results  Component Value Date   WBC 3.8* 09/03/2015   HGB 10.3* 09/03/2015   HCT 34.2* 09/03/2015   MCV 72.0* 09/03/2015   PLT 432* 09/03/2015   NEUTROABS 2.3 09/03/2015     Medications: I have reviewed the patient's current medications.  Assessment/Plan: 1. Adenocarcinoma the distal sigmoid colon, poorly differentiated, stage II (T3 N0), status post a laparoscopic assisted sigmoid colectomy 08/09/2015  No loss of mismatch repair protein expression  Elevated preoperative CEA  Staging CT of the abdomen and pelvis 06/22/2015 with no evidence of distant metastatic disease   Mildly elevated CEA  01/10/2016   2. Microcytic anemia-likely iron deficiency anemia secondary to colon cancer,persistent mild has a chronic history of anemia  3. Tubular villous adenoma on the sigmoid colon resection specimen 08/09/2015  Multiple tubular adenomas removed on a colonoscopy 11/16/2015     Disposition:  She remains in clinical remission from colon cancer. We will follow-up on the CEA from today. We checked a CBC and ferritin level today. She will continue iron. Ms. Yaney will return for an office and lab visit in 6 months. She will see Dr. Zella Richer in the interim. She will have a follow-up colonoscopy in December 2017.  [name], MD  02/21/2016  10:17 AM

## 2016-02-22 ENCOUNTER — Telehealth: Payer: Self-pay | Admitting: *Deleted

## 2016-02-22 LAB — CEA (PARALLEL TESTING): CEA: 2.5 ng/mL — AB

## 2016-02-22 LAB — CEA: CEA1: 4.7 ng/mL (ref 0.0–4.7)

## 2016-02-22 NOTE — Telephone Encounter (Signed)
Pt notified of normal HGB result. Instructed her to continue iron, she voiced understanding.

## 2016-02-22 NOTE — Telephone Encounter (Signed)
-----   Message from Ladell Pier, MD sent at 02/21/2016  4:41 PM EDT ----- Please call patient, hemoblobin is better, continue iron, f/u as scheduled

## 2016-02-25 ENCOUNTER — Telehealth: Payer: Self-pay | Admitting: *Deleted

## 2016-02-25 DIAGNOSIS — C187 Malignant neoplasm of sigmoid colon: Secondary | ICD-10-CM

## 2016-02-25 NOTE — Telephone Encounter (Signed)
-----   Message from Ladell Pier, MD sent at 02/25/2016  5:15 PM EDT ----- Please call patient, does she smoke?, cea is at upper normal, repeat 2 months, and f/u office as scheduled

## 2016-02-25 NOTE — Telephone Encounter (Signed)
Per Dr. Ammie Dalton, pt notified that cea is at upper normal and that we will repeat cea in 2 months.  Pt states that she does not smoke.  Pt has no questions at this time and is appreciative of call.

## 2016-02-26 ENCOUNTER — Telehealth: Payer: Self-pay | Admitting: Oncology

## 2016-02-26 NOTE — Telephone Encounter (Signed)
lvm to inform patient of lab only appt 6/1 at 1015 am

## 2016-04-17 ENCOUNTER — Other Ambulatory Visit (HOSPITAL_BASED_OUTPATIENT_CLINIC_OR_DEPARTMENT_OTHER): Payer: Medicare Other

## 2016-04-17 DIAGNOSIS — Z85038 Personal history of other malignant neoplasm of large intestine: Secondary | ICD-10-CM

## 2016-04-17 DIAGNOSIS — C187 Malignant neoplasm of sigmoid colon: Secondary | ICD-10-CM

## 2016-04-18 LAB — CEA (PARALLEL TESTING): CEA: 3.1 ng/mL — AB

## 2016-04-18 LAB — CEA: CEA: 6.6 ng/mL — ABNORMAL HIGH (ref 0.0–4.7)

## 2016-04-21 ENCOUNTER — Telehealth: Payer: Self-pay | Admitting: *Deleted

## 2016-04-21 DIAGNOSIS — C189 Malignant neoplasm of colon, unspecified: Secondary | ICD-10-CM

## 2016-04-21 NOTE — Telephone Encounter (Signed)
Per Dr. Benay Spice, pt notified that CEA is stable and mildly elevated.  Will send POF for office visit and labs in the next 4-6 weeks.  Pt has appt with Dr. Zella Richer on 04/13/16.  Pt has no questions or concerns at this time.

## 2016-04-21 NOTE — Telephone Encounter (Signed)
-----   Message from Ladell Pier, MD sent at 04/21/2016  5:26 PM EDT ----- Please call patient, cea is stable and mildly elevated, schedule office visit and cea/cbc/cmet next 4-6 weeks sherrill or lisa,has she seen Dr/ Zella Richer

## 2016-04-22 ENCOUNTER — Telehealth: Payer: Self-pay | Admitting: Oncology

## 2016-04-22 NOTE — Telephone Encounter (Signed)
lvm to inform patient of lab/ov follow up appt date/time per 6/5 pof

## 2016-04-22 NOTE — Telephone Encounter (Signed)
Called pt to confirm appt date with Dr. Zella Richer.  Appointment is scheduled for 05/14/16.  Dr. Benay Spice notified.

## 2016-05-26 ENCOUNTER — Other Ambulatory Visit (HOSPITAL_BASED_OUTPATIENT_CLINIC_OR_DEPARTMENT_OTHER): Payer: Medicare Other

## 2016-05-26 ENCOUNTER — Other Ambulatory Visit: Payer: Self-pay | Admitting: *Deleted

## 2016-05-26 ENCOUNTER — Ambulatory Visit (HOSPITAL_BASED_OUTPATIENT_CLINIC_OR_DEPARTMENT_OTHER): Payer: Medicare Other | Admitting: Oncology

## 2016-05-26 ENCOUNTER — Telehealth: Payer: Self-pay | Admitting: Oncology

## 2016-05-26 VITALS — BP 154/66 | HR 63 | Temp 97.7°F | Resp 17 | Ht 69.0 in | Wt 177.8 lb

## 2016-05-26 DIAGNOSIS — D509 Iron deficiency anemia, unspecified: Secondary | ICD-10-CM

## 2016-05-26 DIAGNOSIS — Z85038 Personal history of other malignant neoplasm of large intestine: Secondary | ICD-10-CM

## 2016-05-26 DIAGNOSIS — C187 Malignant neoplasm of sigmoid colon: Secondary | ICD-10-CM

## 2016-05-26 DIAGNOSIS — C189 Malignant neoplasm of colon, unspecified: Secondary | ICD-10-CM

## 2016-05-26 LAB — CBC WITH DIFFERENTIAL/PLATELET
BASO%: 1.8 % (ref 0.0–2.0)
Basophils Absolute: 0.1 10*3/uL (ref 0.0–0.1)
EOS ABS: 0.2 10*3/uL (ref 0.0–0.5)
EOS%: 4.6 % (ref 0.0–7.0)
HCT: 43.1 % (ref 34.8–46.6)
HGB: 14.4 g/dL (ref 11.6–15.9)
LYMPH%: 23.3 % (ref 14.0–49.7)
MCH: 27.2 pg (ref 25.1–34.0)
MCHC: 33.4 g/dL (ref 31.5–36.0)
MCV: 81.3 fL (ref 79.5–101.0)
MONO#: 0.3 10*3/uL (ref 0.1–0.9)
MONO%: 8.4 % (ref 0.0–14.0)
NEUT%: 61.9 % (ref 38.4–76.8)
NEUTROS ABS: 2.5 10*3/uL (ref 1.5–6.5)
Platelets: 190 10*3/uL (ref 145–400)
RBC: 5.3 10*6/uL (ref 3.70–5.45)
RDW: 15.8 % — AB (ref 11.2–14.5)
WBC: 4 10*3/uL (ref 3.9–10.3)
lymph#: 0.9 10*3/uL (ref 0.9–3.3)

## 2016-05-26 LAB — COMPREHENSIVE METABOLIC PANEL
ALBUMIN: 3.8 g/dL (ref 3.5–5.0)
ALK PHOS: 81 U/L (ref 40–150)
ALT: 10 U/L (ref 0–55)
AST: 25 U/L (ref 5–34)
Anion Gap: 10 mEq/L (ref 3–11)
BUN: 17.1 mg/dL (ref 7.0–26.0)
CALCIUM: 9.4 mg/dL (ref 8.4–10.4)
CO2: 24 mEq/L (ref 22–29)
CREATININE: 1.1 mg/dL (ref 0.6–1.1)
Chloride: 105 mEq/L (ref 98–109)
EGFR: 51 mL/min/{1.73_m2} — ABNORMAL LOW (ref >90)
GLUCOSE: 84 mg/dL (ref 70–140)
POTASSIUM: 4.4 meq/L (ref 3.5–5.1)
Sodium: 139 mEq/L (ref 136–145)
Total Bilirubin: 0.39 mg/dL (ref 0.20–1.20)
Total Protein: 7.5 g/dL (ref 6.4–8.3)

## 2016-05-26 MED ORDER — LEVOTHYROXINE SODIUM 137 MCG PO TABS: 137.0000 ug | ORAL_TABLET | Freq: Every day | ORAL | Status: DC

## 2016-05-26 NOTE — Progress Notes (Signed)
  [employer] OFFICE PROGRESS NOTE   Diagnosis: Colon cancer  INTERVAL HISTORY:   Nichole Cross returns as scheduled. She feels well. Good appetite. She reports a recent episode of passing "clots "per rectum. No other bleeding. She is followed by Dr. Zella Richer for nodularity near the midline abdominal scar.  Objective:  Vital signs in last 24 hours:  Blood pressure 154/66, pulse 63, temperature 97.7 F (36.5 C), temperature source Oral, resp. rate 17, height _0  (1.753 m), weight 177 lb 12.8 oz (80.65 kg), SpO2 100 %.    HEENT: Neck without mass Lymphatics: No cervical, supra-clavicular, axillary, or inguinal nodes Resp: Lungs clear bilaterally Cardio: Regular rate and rhythm GI: No hepatosplenomegaly, no mass, nontender, midline scar without evidence of recurrent tumor. I cannot palpate an abdominal wall nodule or mass Vascular: No leg edema  Lab Results:  Lab Results  Component Value Date   WBC 4.0 05/26/2016   HGB 14.4 05/26/2016   HCT 43.1 05/26/2016   MCV 81.3 05/26/2016   PLT 190 05/26/2016   NEUTROABS 2.5 05/26/2016      Lab Results  Component Value Date   CEA1 6.6* 04/17/2016    Medications: I have reviewed the patient's current medications.  Assessment/Plan: 1. Adenocarcinoma the distal sigmoid colon, poorly differentiated, stage II (T3 N0), status post a laparoscopic assisted sigmoid colectomy 08/09/2015  No loss of mismatch repair protein expression  Elevated preoperative CEA  Staging CT of the abdomen and pelvis 06/22/2015 with no evidence of distant metastatic disease   Mildly elevated CEA 02/23/2017And 04/17/2016   2. Microcytic anemia-likely iron deficiency anemia secondary to colon cancer,Improved  3. Tubular villous adenoma on the sigmoid colon resection specimen 08/09/2015  Multiple tubular adenomas removed on a colonoscopy 11/16/2015    Disposition:  She appears well. I cannot palpate a nodule at the abdominal  wall today. We will follow-up on the CEA. The plan is to proceed with restaging CTs if the CEA is elevated.  She will see Dr. Hilarie Fredrickson for recurrent rectal bleeding. It would be  unlikely for her to develop a new polyp/malignancy in the colon after undergoing a colonoscopy in December 2016.  She will return for an office visit in 3 months. We will see her sooner as indicated.  [name], MD  05/26/2016  10:24 AM

## 2016-05-26 NOTE — Telephone Encounter (Signed)
Per 7/10 pof lab/fu 3 months. Patient already on schedule for lab/BS 10/12. Appointments remain as scheduled and patient given avs report and appointments for 10/12.

## 2016-05-27 LAB — CEA: CEA: 12.2 ng/mL — ABNORMAL HIGH (ref 0.0–4.7)

## 2016-05-28 ENCOUNTER — Telehealth: Payer: Self-pay | Admitting: Oncology

## 2016-05-28 ENCOUNTER — Telehealth: Payer: Self-pay | Admitting: Medical Oncology

## 2016-05-28 DIAGNOSIS — C187 Malignant neoplasm of sigmoid colon: Secondary | ICD-10-CM

## 2016-05-28 NOTE — Telephone Encounter (Signed)
lvm to inform pt of 7/20 appt date/time with [facility] per 7/12 pof. Informed pt that central scheduling will contact her to schedule appts

## 2016-05-28 NOTE — Telephone Encounter (Signed)
-----   Message from Ladell Pier, MD sent at 05/27/2016  4:32 PM EDT ----- Please call patient, cea still elevated schedule Cts chest, abd/pelvis with contrast, office few days after CT

## 2016-05-28 NOTE — Telephone Encounter (Signed)
Pt notified . onc tx request sent and CT scans orders placed

## 2016-06-03 ENCOUNTER — Ambulatory Visit (HOSPITAL_COMMUNITY)
Admission: RE | Admit: 2016-06-03 | Discharge: 2016-06-03 | Disposition: A | Discharge status: Home / Self Care | Payer: Medicare Other | Source: Ambulatory Visit | Attending: Oncology | Admitting: Oncology

## 2016-06-03 ENCOUNTER — Encounter (HOSPITAL_COMMUNITY): Payer: Self-pay

## 2016-06-03 DIAGNOSIS — R160 Hepatomegaly, not elsewhere classified: Secondary | ICD-10-CM | POA: Diagnosis not present

## 2016-06-03 DIAGNOSIS — R918 Other nonspecific abnormal finding of lung field: Secondary | ICD-10-CM | POA: Diagnosis not present

## 2016-06-03 DIAGNOSIS — K432 Incisional hernia without obstruction or gangrene: Secondary | ICD-10-CM | POA: Insufficient documentation

## 2016-06-03 DIAGNOSIS — I700 Atherosclerosis of aorta: Secondary | ICD-10-CM | POA: Diagnosis not present

## 2016-06-03 DIAGNOSIS — C187 Malignant neoplasm of sigmoid colon: Secondary | ICD-10-CM | POA: Insufficient documentation

## 2016-06-03 DIAGNOSIS — N2889 Other specified disorders of kidney and ureter: Secondary | ICD-10-CM | POA: Diagnosis not present

## 2016-06-03 MED ORDER — IOPAMIDOL (ISOVUE-300) INJECTION 61%
100.0000 mL | Freq: Once | INTRAVENOUS | Status: AC | PRN
  Administered 2016-06-03: 100 mL via INTRAVENOUS

## 2016-06-05 ENCOUNTER — Ambulatory Visit (HOSPITAL_BASED_OUTPATIENT_CLINIC_OR_DEPARTMENT_OTHER): Payer: Medicare Other | Admitting: Oncology

## 2016-06-05 ENCOUNTER — Telehealth: Payer: Self-pay | Admitting: *Deleted

## 2016-06-05 ENCOUNTER — Telehealth: Payer: Self-pay | Admitting: Internal Medicine

## 2016-06-05 ENCOUNTER — Telehealth: Payer: Self-pay | Admitting: Oncology

## 2016-06-05 VITALS — BP 132/72 | HR 68 | Temp 97.7°F | Resp 18 | Ht 69.0 in | Wt 178.1 lb

## 2016-06-05 DIAGNOSIS — R932 Abnormal findings on diagnostic imaging of liver and biliary tract: Secondary | ICD-10-CM | POA: Diagnosis not present

## 2016-06-05 DIAGNOSIS — C187 Malignant neoplasm of sigmoid colon: Secondary | ICD-10-CM | POA: Diagnosis not present

## 2016-06-05 DIAGNOSIS — R93421 Abnormal radiologic findings on diagnostic imaging of right kidney: Secondary | ICD-10-CM | POA: Diagnosis not present

## 2016-06-05 DIAGNOSIS — D509 Iron deficiency anemia, unspecified: Secondary | ICD-10-CM | POA: Diagnosis not present

## 2016-06-05 NOTE — Progress Notes (Signed)
[place] OFFICE PROGRESS NOTE   Diagnosis: Colon cancer  INTERVAL HISTORY:   Nichole Cross returns as scheduled. No complaint.  Objective:  Vital signs in last 24 hours:  Blood pressure 132/72, pulse 68, temperature 97.7 F (36.5 C), temperature source Oral, resp. rate 18, height '5\' 9"'  (1.753 m), weight 178 lb 1.6 oz (80.786 kg), SpO2 98 %.    Physical examination-not performed today  Lab Results:  Lab Results  Component Value Date   WBC 4.0 05/26/2016   HGB 14.4 05/26/2016   HCT 43.1 05/26/2016   MCV 81.3 05/26/2016   PLT 190 05/26/2016   NEUTROABS 2.5 05/26/2016     Lab Results  Component Value Date   CEA1 12.2* 05/26/2016    Imaging:  Ct Chest W Contrast  06/03/2016  CLINICAL DATA:  Restaging sigmoid colon cancer status post partial colectomy 08/09/2015. Elevated CEA. EXAM: CT CHEST, ABDOMEN, AND PELVIS WITH CONTRAST TECHNIQUE: Multidetector CT imaging of the chest, abdomen and pelvis was performed following the standard protocol during bolus administration of intravenous contrast. CONTRAST:  140m ISOVUE-300 IOPAMIDOL (ISOVUE-300) INJECTION 61% COMPARISON:  06/22/2015 CT abdomen/ pelvis. 08/02/2015 chest radiograph. No prior chest CT. FINDINGS: CT CHEST Mediastinum/Nodes: Normal heart size. No significant pericardial fluid/thickening. Mildly atherosclerotic nonaneurysmal thoracic aorta. Normal caliber pulmonary arteries. No central pulmonary emboli. Atrophic appearing thyroid gland. Unremarkable esophagus. No pathologically enlarged axillary, mediastinal or hilar lymph nodes. Lungs/Pleura: No pneumothorax. No pleural effusion. Left upper lobe 2 mm solid pulmonary nodule (series 4/ image 25). Subpleural 4 mm basilar left lower lobe pulmonary nodule (series 4/ image 111) is stable since 06/22/2015. No acute consolidative airspace disease or additional significant pulmonary nodules. Musculoskeletal:  No aggressive appearing focal osseous lesions. CT ABDOMEN  AND PELVIS Hepatobiliary: Normal liver size. There is an indistinctly marginated hypodense 1.3 x 1.3 cm liver lesion in the lateral segment left liver lobe (series 2/ image 57), which appears new since 06/22/2015. No additional liver lesions. Normal gallbladder with no radiopaque cholelithiasis. No biliary ductal dilatation. Pancreas: Normal, with no mass or duct dilation. Spleen: Normal size spleen. Cystic 1.0 cm lesion in the central spleen is stable since 06/22/2015, consistent with a benign lesion. No additional splenic lesions. Adrenals/Urinary Tract: No discrete adrenal nodules. There is a 2.7 x 2.5 cm solid hyperdense renal cortical mass in the anterior upper right kidney (series 2/ image 60), which demonstrates de-enhancement on the delayed sequence, and which appears slightly increased in size from 2.5 x 2.2 cm on 06/22/2015. No additional renal masses. No hydronephrosis. Normal bladder. Stomach/Bowel: Grossly normal stomach. Normal caliber small bowel with no small bowel wall thickening. Appendix not discretely visualized. Status post partial distal colectomy with intact appearing colorectal anastomosis. No appreciable mass or definite wall thickening at the colorectal anastomosis. Vascular/Lymphatic: Normal caliber abdominal aorta. Patent portal, splenic, hepatic and renal veins. No pathologically enlarged lymph nodes in the abdomen or pelvis. Reproductive: Status post hysterectomy, with no abnormal findings at the vaginal cuff. No adnexal mass. Other: No pneumoperitoneum, ascites or focal fluid collection. Tiny fat containing peri-incisional ventral hernia in the right paramedian lower abdominal wall. Musculoskeletal: No aggressive appearing focal osseous lesions. Marked degenerative disc disease at L5-S1. IMPRESSION: 1. New indistinct hypodense 1.3 cm lateral segment left liver lobe mass, worrisome for liver metastasis. Recommend further characterization with MRI abdomen without and with IV contrast. 2.  Solid hyperdense 2.7 cm renal cortical mass in the upper right kidney, worrisome for renal cell carcinoma given the de-enhancement on the delayed  sequence. This mass can also be further characterized on MRI abdomen without and with IV contrast. 3. No evidence of local tumor recurrence at the colorectal anastomosis. 4. Left upper lobe 2 mm pulmonary nodule, for which no prior comparison exists. Left lower lobe 4 mm pulmonary nodule is stable for 11 months and probably benign. Recommend follow-up chest CT in 3-6 months. 5. Additional findings include aortic atherosclerosis and tiny fat containing peri-incisional hernia in the ventral lower abdominal wall. Electronically Signed   By: Ilona Sorrel M.D.   On: 06/03/2016 14:01   Ct Abdomen Pelvis W Contrast  06/03/2016  CLINICAL DATA:  Restaging sigmoid colon cancer status post partial colectomy 08/09/2015. Elevated CEA. EXAM: CT CHEST, ABDOMEN, AND PELVIS WITH CONTRAST TECHNIQUE: Multidetector CT imaging of the chest, abdomen and pelvis was performed following the standard protocol during bolus administration of intravenous contrast. CONTRAST:  171m ISOVUE-300 IOPAMIDOL (ISOVUE-300) INJECTION 61% COMPARISON:  06/22/2015 CT abdomen/ pelvis. 08/02/2015 chest radiograph. No prior chest CT. FINDINGS: CT CHEST Mediastinum/Nodes: Normal heart size. No significant pericardial fluid/thickening. Mildly atherosclerotic nonaneurysmal thoracic aorta. Normal caliber pulmonary arteries. No central pulmonary emboli. Atrophic appearing thyroid gland. Unremarkable esophagus. No pathologically enlarged axillary, mediastinal or hilar lymph nodes. Lungs/Pleura: No pneumothorax. No pleural effusion. Left upper lobe 2 mm solid pulmonary nodule (series 4/ image 25). Subpleural 4 mm basilar left lower lobe pulmonary nodule (series 4/ image 111) is stable since 06/22/2015. No acute consolidative airspace disease or additional significant pulmonary nodules. Musculoskeletal:  No aggressive  appearing focal osseous lesions. CT ABDOMEN AND PELVIS Hepatobiliary: Normal liver size. There is an indistinctly marginated hypodense 1.3 x 1.3 cm liver lesion in the lateral segment left liver lobe (series 2/ image 57), which appears new since 06/22/2015. No additional liver lesions. Normal gallbladder with no radiopaque cholelithiasis. No biliary ductal dilatation. Pancreas: Normal, with no mass or duct dilation. Spleen: Normal size spleen. Cystic 1.0 cm lesion in the central spleen is stable since 06/22/2015, consistent with a benign lesion. No additional splenic lesions. Adrenals/Urinary Tract: No discrete adrenal nodules. There is a 2.7 x 2.5 cm solid hyperdense renal cortical mass in the anterior upper right kidney (series 2/ image 60), which demonstrates de-enhancement on the delayed sequence, and which appears slightly increased in size from 2.5 x 2.2 cm on 06/22/2015. No additional renal masses. No hydronephrosis. Normal bladder. Stomach/Bowel: Grossly normal stomach. Normal caliber small bowel with no small bowel wall thickening. Appendix not discretely visualized. Status post partial distal colectomy with intact appearing colorectal anastomosis. No appreciable mass or definite wall thickening at the colorectal anastomosis. Vascular/Lymphatic: Normal caliber abdominal aorta. Patent portal, splenic, hepatic and renal veins. No pathologically enlarged lymph nodes in the abdomen or pelvis. Reproductive: Status post hysterectomy, with no abnormal findings at the vaginal cuff. No adnexal mass. Other: No pneumoperitoneum, ascites or focal fluid collection. Tiny fat containing peri-incisional ventral hernia in the right paramedian lower abdominal wall. Musculoskeletal: No aggressive appearing focal osseous lesions. Marked degenerative disc disease at L5-S1. IMPRESSION: 1. New indistinct hypodense 1.3 cm lateral segment left liver lobe mass, worrisome for liver metastasis. Recommend further characterization with  MRI abdomen without and with IV contrast. 2. Solid hyperdense 2.7 cm renal cortical mass in the upper right kidney, worrisome for renal cell carcinoma given the de-enhancement on the delayed sequence. This mass can also be further characterized on MRI abdomen without and with IV contrast. 3. No evidence of local tumor recurrence at the colorectal anastomosis. 4. Left upper lobe 2 mm pulmonary  nodule, for which no prior comparison exists. Left lower lobe 4 mm pulmonary nodule is stable for 11 months and probably benign. Recommend follow-up chest CT in 3-6 months. 5. Additional findings include aortic atherosclerosis and tiny fat containing peri-incisional hernia in the ventral lower abdominal wall. Electronically Signed   By: Ilona Sorrel M.D.   On: 06/03/2016 14:01   CT images were reviewed with Ms. Casselman and her family  Medications: I have reviewed the patient's current medications.  Assessment/Plan: 1. Adenocarcinoma the distal sigmoid colon, poorly differentiated, stage II (T3 N0), status post a laparoscopic assisted sigmoid colectomy 08/09/2015  No loss of mismatch repair protein expression  Elevated preoperative CEA  Staging CT of the abdomen and pelvis 06/22/2015 with no evidence of distant metastatic disease   Mildly elevated CEA 02/23/2017And 04/17/2016  CTs chest, abdomen, and pelvis on 06/04/2016 mm left upper lobe nodule-no comparison available, new hypodense lesion in the left liver, solid left renal mass   2. Microcytic anemia-likely iron deficiency anemia secondary to colon cancer,Improved  3. Tubular villous adenoma on the sigmoid colon resection specimen 08/09/2015  Multiple tubular adenomas removed on a colonoscopy 11/16/2015   Disposition:  Nichole Cross has a persistent elevated CEA. The CT on 06/03/2016 is concerning for an isolated liver metastasis. There is also a solid lesion in the right kidney that may represent a renal cell primary. I discussed the differential  diagnosis and reviewed the CT images with Ms. Basilio and her family. She will be referred for an MRI of the abdomen to evaluate both the liver and right kidney. I will present her case at the GI tumor conference on 06/11/2016. She will return for an office visit on 06/12/2016.  She may be a candidate for a liver resection or ablation if the MRI is consistent with metastatic disease.  [name], MD  06/05/2016  12:45 PM

## 2016-06-05 NOTE — Telephone Encounter (Signed)
Rec'd from [facility] forward 80 pages to Dr. Quay Burow

## 2016-06-05 NOTE — Telephone Encounter (Signed)
Received call from pt stating that she has an MRI scheduled with GI for 06/16/16 & sees Dr Benay Spice on 06/12/16 & thought Dr Benay Spice wanted ASAP.  Called [facility] & changed MRI to [facility] for 06/10/16 @ 9 am & to be there at 8:45 am.  She expressed understanding.

## 2016-06-05 NOTE — Telephone Encounter (Signed)
Pt confirmed appt and received avs °

## 2016-06-07 ENCOUNTER — Encounter: Payer: Self-pay | Admitting: Internal Medicine

## 2016-06-07 DIAGNOSIS — D509 Iron deficiency anemia, unspecified: Secondary | ICD-10-CM | POA: Insufficient documentation

## 2016-06-07 DIAGNOSIS — M8580 Other specified disorders of bone density and structure, unspecified site: Secondary | ICD-10-CM | POA: Insufficient documentation

## 2016-06-09 ENCOUNTER — Telehealth: Payer: Self-pay | Admitting: *Deleted

## 2016-06-09 NOTE — Telephone Encounter (Signed)
Patient had called back to let nurse know that she his having her MRI at [facility] tomorrow at 0900. She agrees to obtain a CD at the time and bring it over to the [place] [place] to have loaded in system for Dr. Benay Spice to review and present at 7/26 tumor board discussion.

## 2016-06-10 ENCOUNTER — Ambulatory Visit (HOSPITAL_COMMUNITY): Payer: Medicare Other

## 2016-06-10 ENCOUNTER — Ambulatory Visit (HOSPITAL_COMMUNITY)
Admission: RE | Admit: 2016-06-10 | Discharge: 2016-06-10 | Disposition: A | Discharge status: Home / Self Care | Payer: Medicare Other | Source: Ambulatory Visit | Attending: Oncology | Admitting: Oncology

## 2016-06-10 DIAGNOSIS — K769 Liver disease, unspecified: Secondary | ICD-10-CM | POA: Insufficient documentation

## 2016-06-10 DIAGNOSIS — C187 Malignant neoplasm of sigmoid colon: Secondary | ICD-10-CM | POA: Diagnosis present

## 2016-06-10 DIAGNOSIS — N289 Disorder of kidney and ureter, unspecified: Secondary | ICD-10-CM | POA: Diagnosis not present

## 2016-06-10 MED ORDER — GADOBENATE DIMEGLUMINE 529 MG/ML IV SOLN
20.0000 mL | Freq: Once | INTRAVENOUS | Status: AC | PRN
  Administered 2016-06-10: 16 mL via INTRAVENOUS

## 2016-06-12 ENCOUNTER — Telehealth: Payer: Self-pay | Admitting: Oncology

## 2016-06-12 ENCOUNTER — Ambulatory Visit (HOSPITAL_BASED_OUTPATIENT_CLINIC_OR_DEPARTMENT_OTHER): Payer: Medicare Other | Admitting: Oncology

## 2016-06-12 ENCOUNTER — Encounter: Payer: Self-pay | Admitting: *Deleted

## 2016-06-12 VITALS — BP 150/71 | HR 62 | Temp 98.2°F | Resp 18 | Ht 69.0 in | Wt 177.9 lb

## 2016-06-12 DIAGNOSIS — C187 Malignant neoplasm of sigmoid colon: Secondary | ICD-10-CM

## 2016-06-12 DIAGNOSIS — D509 Iron deficiency anemia, unspecified: Secondary | ICD-10-CM

## 2016-06-12 NOTE — Patient Instructions (Signed)
[facility] Discharge Instructions  RECOMMENDATIONS MADE BY THE CONSULTANT AND ANY TEST RESULTS WILL BE SENT TO YOUR REFERRING PHYSICIAN.  EXAM FINDINGS BY THE PHYSICIAN TODAY AND SIGNS OR SYMPTOMS TO REPORT TO CLINIC OR PRIMARY PHYSICIAN: Your MRI has shown a mass in left and right liver as well as a renal cancer on top of the right kidney.  MEDICATIONS PRESCRIBED:  None  INSTRUCTIONS GIVEN AND DISCUSSED: Potential treatment is : laparoscopic removal of liver tumors (general surgeon) and removal of renal mass (urology) OR surgery to remove liver masses and cryoablation of renal mass OR cryoablation of renal mass and microwave ablation of liver masses Following removal-discuss option of oral chemo with Xeloda for #8 cycles and follow your CEA level  SPECIAL INSTRUCTIONS/FOLLOW-UP: See Dr. Barry Dienes here at [facility] on 06/20/16 at 10:15--check in at front desk at 10:00 (do not need to register).  Thank you for choosing [facility] to provide your oncology and hematology care.  To afford each patient quality time with our providers, please arrive at least 30 minutes before your scheduled appointment time.  With your help, our goal is to use those 30 minutes to complete the necessary work-up to ensure our physicians have the information they need to help with your evaluation and healthcare recommendations.     ___________________  Should you have questions after your visit to [facility], please contact our office at [phone] between the hours of 8:30 a.m. and 4:30 p.m.  Voicemails left after 4:00 p.m. will not be returned until the following business day.  For prescription refill requests, have your pharmacy contact our office with your prescription refill request. We request 24 hour notice for all refill requests.

## 2016-06-12 NOTE — Progress Notes (Signed)
[place] OFFICE PROGRESS NOTE   Diagnosis: Colon cancer, renal mass  INTERVAL HISTORY:   Nichole Cross returns as scheduled. No complaint.  Objective:  Vital signs in last 24 hours:  Blood pressure (!) 150/71, pulse 62, temperature 98.2 F (36.8 C), temperature source Oral, resp. rate 18, height '5\' 9"'  (1.753 m), weight 177 lb 14.4 oz (80.7 kg), SpO2 100 %.   Physical examination-not performed today   Lab Results:  Lab Results  Component Value Date   WBC 4.0 05/26/2016   HGB 14.4 05/26/2016   HCT 43.1 05/26/2016   MCV 81.3 05/26/2016   PLT 190 05/26/2016   NEUTROABS 2.5 05/26/2016    Lab Results  Component Value Date   CEA1 12.2 (H) 05/26/2016    Imaging:  Mr Abdomen W Wo Contrast  Result Date: 06/10/2016 CLINICAL DATA:  Colon cancer. Indeterminate liver lesions and renal lesions on computer tomography. EXAM: MRI ABDOMEN WITHOUT AND WITH CONTRAST TECHNIQUE: Multiplanar multisequence MR imaging of the abdomen was performed both before and after the administration of intravenous contrast. CONTRAST:  30m MULTIHANCE GADOBENATE DIMEGLUMINE 529 MG/ML IV SOLN COMPARISON:  CT 06/03/2016 FINDINGS: Lower chest:  Lung bases are clear. Hepatobiliary: In the lateral LEFT hepatic lobe (segment 2) there is a round lesion measuring 19 mm by 16 mm (image 28, series 3. This lesion is just beneath the liver capsule. Type lesion essentially hypo intense on T2 weighted imaging and has an enhancing ri within the posterior aspect of the RIGHT hepatic lobe (segment 6) similar round 16 mm lesion is noted on image 29, series 3. M peripheral round discontinuous (series 9 12/26/2001. Hepatic steatosis is noted. No biliary duct dilatation. Gallbladder normal. Common bile duct normal. Pancreas: Normal pancreatic parenchymal intensity. No ductal dilatation or inflammation. Spleen: Normal spleen. Adrenals/urinary tract: Adrenal glands normal. Within the upper pole of the RIGHT kidney  well-circumscribed rounded mass measures 2.5 by 2.4 cm (image 33, series 6) and is relatively hypo intense on T2 weighted imaging and isointense on T1 weighted imaging. Lesion demonstrates avid homogeneous early postcontrast enhancement (image 42, series 901). Contrast enhancement equal to the normal renal cortex. Stomach/Bowel: Stomach and limited of the small bowel is unremarkable Vascular/Lymphatic: Abdominal aortic normal caliber. No retroperitoneal periportal lymphadenopathy. Musculoskeletal: No aggressive osseous lesion IMPRESSION: 1. Two enhancing lesions in the liver (segment II and segment VI) consistent with hepatic metastasis. 2. Solid homogeneously and intensely enhancing lesion in the anterior cortex of the RIGHT kidney is most consistent with renal cell carcinoma. Electronically Signed   By: SSuzy BouchardM.D.   On: 06/10/2016 13:43   Medications: I have reviewed the patient's current medications.  Assessment/Plan: 1. Adenocarcinoma the distal sigmoid colon, poorly differentiated, stage II (T3 N0), status post a laparoscopic assisted sigmoid colectomy 08/09/2015 ? No loss of mismatch repair protein expression ? Elevated preoperative CEA ? Staging CT of the abdomen and pelvis 06/22/2015 with no evidence of distant metastatic disease  ? Mildly elevated CEA 02/23/2017And 04/17/2016 ? CTs chest, abdomen, and pelvis on 06/04/2016 mm left upper lobe nodule-no comparison available, new hypodense lesion in the left liver, solid left renal mass ? MRI 725 2017-2 enhancing lesions in the liver consistent with metastatic disease, segment 2 and segment 5. Solid enhancing right renal lesion consistent with a renal cell carcinoma   2. Microcytic anemia-likely iron deficiency anemia secondary to colon cancer,Improved  3. Tubular villous adenoma on the sigmoid colon resection specimen 08/09/2015  Multiple tubular adenomas removed on a colonoscopy  11/16/2015    Disposition:  Ms.  Cross appears well. I discussed the MRI findings and reviewed the images with her. Her case was presented at the GI tumor conference on 06/11/2016. She will see Dr. Barry Dienes to consider resection of the liver lesions versus ablation. I explained there is a chance of curing the metastatic colon cancer with resection or ablation of the liver lesions.  I will recommend adjuvant capecitabine following hepatic directed therapy. I will defer additional imaging such as a PET scan to Dr. Barry Dienes.  The renal lesion is most likely a primary renal cell carcinoma. This could be resected or ablated. We made a referral to Dr. Alinda Money.  She will return for an office visit here in approximately one month. We are available to see her sooner as needed.  [name], MD  06/12/2016  9:14 AM

## 2016-06-12 NOTE — Telephone Encounter (Signed)
left msg confirming apt with [facility], 8/4 @ 8:45 , pt will see dr Noah Delaine, dr borden not available until january

## 2016-06-12 NOTE — Progress Notes (Signed)
Oncology Nurse Navigator Documentation  Oncology Nurse Navigator Flowsheets 06/12/2016  Navigator Location [facility]  Navigator Encounter Type Follow-up Appt;Diagnostic Results  Patient Visit Type MedOnc;Follow-up  Treatment Phase Abnormal Scans  Barriers/Navigation Needs Education;Coordination of Care--surgical consult  Education Understanding Cancer/ Treatment Options  Interventions Coordination of Care;Education Method  Coordination of Care Appts--scheduled to see Dr. Barry Dienes in GI [facility] 06/20/16 at 10:15 (patient is aware)  Education Method Verbal;Written;Teach-back  Support Groups/Services -  Acuity Level 2  Time Spent with Patient 76  Faxed med list and demographic information, insurance cards to [place] [phone] for registration. Notified Dr. Barry Dienes of addition and her nurse. See patient instructions on office visit today.

## 2016-06-12 NOTE — Telephone Encounter (Signed)
Waiting on [facility] for apt

## 2016-06-12 NOTE — Progress Notes (Signed)
Patient called nurse back to update that she is seeing urologist, Dr. Nicolette Bang at [facility] on 06/20/16 at [place] and that she may be late to see Dr. Barry Dienes over here. Informed that will be fine.

## 2016-06-12 NOTE — Telephone Encounter (Signed)
Gave pt cal & avs °

## 2016-06-16 ENCOUNTER — Telehealth: Payer: Self-pay | Admitting: Oncology

## 2016-06-16 ENCOUNTER — Other Ambulatory Visit: Payer: Medicare Other

## 2016-06-16 NOTE — Telephone Encounter (Signed)
Faxed pt records to [facility] urology [phone]

## 2016-06-17 DIAGNOSIS — C649 Malignant neoplasm of unspecified kidney, except renal pelvis: Secondary | ICD-10-CM

## 2016-06-17 DIAGNOSIS — C787 Secondary malignant neoplasm of liver and intrahepatic bile duct: Secondary | ICD-10-CM

## 2016-06-17 HISTORY — DX: Malignant neoplasm of unspecified kidney, except renal pelvis: C64.9

## 2016-06-17 HISTORY — DX: Secondary malignant neoplasm of liver and intrahepatic bile duct: C78.7

## 2016-06-20 ENCOUNTER — Encounter: Payer: Medicare Other | Admitting: Nutrition

## 2016-06-20 ENCOUNTER — Ambulatory Visit: Payer: Medicare Other | Admitting: Physical Therapy

## 2016-06-20 ENCOUNTER — Telehealth: Payer: Self-pay | Admitting: *Deleted

## 2016-06-20 DIAGNOSIS — C187 Malignant neoplasm of sigmoid colon: Secondary | ICD-10-CM

## 2016-06-20 NOTE — Telephone Encounter (Signed)
Oncology Nurse Navigator Documentation  Oncology Nurse Navigator Flowsheets 06/20/2016  Navigator Location [facility]  Navigator Encounter Type Telephone  Telephone Outgoing Call;Clinic/[facility] Follow-up  Patient Visit Type -  Treatment Phase -  Barriers/Navigation Needs Coordination of Care;Education--timing of Xeloda  Education Preparing for Upcoming Surgery/ Treatment  Interventions Coordination of Care--ablation of liver lesions and renal lesion  Coordination of Care Radiology--order entered into [employer] for IR eval and managment  Education Method Verbal  Support Groups/Services -  Acuity -  Time Spent with Patient 30  Left VM that IR order has been placed and per Dr. Benay Spice, Xeloda will be taken after the ablation procedure. Instructed her to call navigator if she has not heard from IR by Wednesday.

## 2016-07-01 ENCOUNTER — Ambulatory Visit
Admission: RE | Admit: 2016-07-01 | Discharge: 2016-07-01 | Disposition: A | Discharge status: Home / Self Care | Payer: Medicare Other | Source: Ambulatory Visit | Attending: Oncology | Admitting: Oncology

## 2016-07-01 ENCOUNTER — Other Ambulatory Visit (HOSPITAL_COMMUNITY): Payer: Self-pay | Admitting: Interventional Radiology

## 2016-07-01 DIAGNOSIS — C787 Secondary malignant neoplasm of liver and intrahepatic bile duct: Secondary | ICD-10-CM

## 2016-07-01 DIAGNOSIS — C187 Malignant neoplasm of sigmoid colon: Secondary | ICD-10-CM

## 2016-07-01 DIAGNOSIS — C641 Malignant neoplasm of right kidney, except renal pelvis: Secondary | ICD-10-CM

## 2016-07-01 HISTORY — DX: Disease of blood and blood-forming organs, unspecified: D75.9

## 2016-07-01 HISTORY — DX: Secondary malignant neoplasm of liver and intrahepatic bile duct: C78.7

## 2016-07-01 HISTORY — PX: IR GENERIC HISTORICAL: IMG1180011

## 2016-07-01 HISTORY — DX: Malignant neoplasm of unspecified kidney, except renal pelvis: C64.9

## 2016-07-01 NOTE — Progress Notes (Signed)
Patient ID: Nichole Cross, female   DOB: 08/24/1941, 75 y.o.   MRN: [id number]       Chief Complaint:  Metastatic colon cancer to the liver. Right renal cell carcinoma.  Referring Physician(s): Sherrill,Gary B  History of Present Illness: Nichole Cross is a 75 y.o. female with a prior history of distal sigmoid colon adenocarcinoma, stage II disease. She underwent laparoscopic sigmoid colectomy 08/09/2015. Surveillance CT imaging 9 months later confirms 2 small liver metastases, within segment 2 and segment 5. She also has a 2.5 cm solid enhancing right upper pole renal mass compatible with a renal cell carcinoma. She presents today to discuss ablation treatment options. Overall she is asymptomatic and doing very well. Excellent functional status. No current abdominal pain or flank pain. No signs of jaundice. No interval fevers. No urinary tract symptoms or hematuria. She has also been seen by surgical oncology and urology.  Past Medical History:  Diagnosis Date  . Anemia   . Arthritis    knees  . Blood dyscrasia 2016   microcytic anemia, likely iron deficiency anemia secondary to colon cancer  . Colon cancer ([place]) 06/2015   tubular villous adenoma on sigmoid colon specimen (08/09/15), myltiple tubular adenomas removed on a colonoscopy  11/16/15  . Complication of anesthesia   . Headache(784.0)    migraines  . Hyperthyroidism   . PONV (postoperative nausea and vomiting)    NAUSEA  . Renal carcinoma ([place]) 06/2016   ? primary  . Secondary liver cancer ([place]) 06/2016   ? metastasis from colon    Past Surgical History:  Procedure Laterality Date  . ABDOMINAL HYSTERECTOMY  1993  . abdominal tumor  1993  . LAPAROSCOPIC PARTIAL COLECTOMY N/A 08/09/2015   Procedure: LAPAROSCOPIC PARTIAL COLECTOMY;  Surgeon: Jackolyn Confer, MD;  Location: [facility];  Service: General;  Laterality: N/A;  . PARTIAL KNEE ARTHROPLASTY  09/09/2012   Procedure: UNICOMPARTMENTAL KNEE;  Surgeon: Mauri Pole, MD;  Location: [facility];  Service: Orthopedics;  Laterality: Left;    Allergies: Aspirin  Medications: Prior to Admission medications   Medication Sig Start Date End Date Taking? Authorizing Provider  alendronate (FOSAMAX) 70 MG tablet Take 70 mg by mouth every 7 (seven) days. Take with a full glass of water on an empty stomach. Sunday   Yes Historical Provider, MD  ferrous sulfate 325 (65 FE) MG tablet Take 1 tablet (325 mg total) by mouth 3 (three) times daily after meals. Patient taking differently: Take 325 mg by mouth 2 (two) times daily with a meal.  09/10/12  Yes Danae Orleans, PA-C  levothyroxine (SYNTHROID, LEVOTHROID) 137 MCG tablet Take 1 tablet (137 mcg total) by mouth daily before breakfast. 05/26/16  Yes Ladell Pier, MD  polyethylene glycol (MIRALAX / GLYCOLAX) packet Take 17 g by mouth daily as needed. Reported on 06/05/2016   Yes Historical Provider, MD  traMADol (ULTRAM) 50 MG tablet Take 50 mg by mouth every 6 (six) hours as needed. 08/31/15  Yes Historical Provider, MD     Family History  Problem Relation Age of Onset  . Colon cancer Neg Hx     Social History   Social History  . Marital status: Divorced    Spouse name: N/A  . Number of children: N/A  . Years of education: N/A   Social History Main Topics  . Smoking status: Never Smoker  . Smokeless tobacco: Never Used  . Alcohol use No  . Drug use: No  . Sexual activity:  Not Asked   Other Topics Concern  . 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  All other systems are negative.  Review of Systems  Vital Signs: BP (!)  145/78 (BP Location: Right Arm, Patient Position: Sitting, Cuff Size: Normal)   Pulse 78   Temp 97.7 F (36.5 C)   Resp 14   Ht 5\' 9"  (1.753 m)   Wt 175 lb (79.4 kg)   SpO2 98%   BMI 25.84 kg/m   Physical Exam  Constitutional: She is oriented to person, place, and time. She appears well-developed and well-nourished. No distress.  Eyes: Conjunctivae are normal. No scleral icterus.  Neck: Normal range of motion. Neck supple.  Cardiovascular: Normal rate, regular rhythm and normal heart sounds.   No murmur heard. Pulmonary/Chest: Effort normal and breath sounds normal. No respiratory distress. She has no wheezes.  Abdominal: Soft. Bowel sounds are normal. She exhibits no distension. There is no tenderness. There is no guarding.  Lymphadenopathy:    She has no cervical adenopathy.  Neurological: She is alert and oriented to person, place, and time.  Skin: Skin is warm and dry. No rash noted. She is not diaphoretic.  Psychiatric: She has a normal mood and affect. Her behavior is normal.    Mallampati Score:   1  Imaging: Ct Chest W Contrast  Result Date: 06/03/2016 CLINICAL DATA:  Restaging sigmoid colon cancer status post partial colectomy 08/09/2015. Elevated CEA. EXAM: CT CHEST, ABDOMEN, AND PELVIS WITH CONTRAST TECHNIQUE: Multidetector CT imaging of the chest, abdomen and pelvis was performed following the standard protocol during bolus administration of intravenous contrast. CONTRAST:  1102mL ISOVUE-300 IOPAMIDOL (ISOVUE-300) INJECTION 61% COMPARISON:  06/22/2015 CT abdomen/ pelvis. 08/02/2015 chest radiograph. No prior chest CT. FINDINGS: CT CHEST Mediastinum/Nodes: Normal heart size. No significant pericardial fluid/thickening. Mildly atherosclerotic nonaneurysmal thoracic aorta. Normal caliber pulmonary arteries. No central pulmonary emboli. Atrophic appearing thyroid gland. Unremarkable esophagus. No pathologically enlarged axillary, mediastinal or hilar lymph nodes. Lungs/Pleura:  No pneumothorax. No pleural effusion. Left upper lobe 2 mm solid pulmonary nodule (series 4/ image 25). Subpleural 4 mm basilar left lower lobe pulmonary nodule (series 4/ image 111) is stable since 06/22/2015. No acute consolidative airspace disease or additional significant pulmonary nodules. Musculoskeletal:  No aggressive appearing focal osseous lesions. CT ABDOMEN AND PELVIS Hepatobiliary: Normal liver size. There is an indistinctly marginated hypodense 1.3 x 1.3 cm liver lesion in the lateral segment left liver lobe (series 2/ image 57), which appears new since 06/22/2015. No additional liver lesions. Normal gallbladder with no radiopaque cholelithiasis. No biliary ductal dilatation. Pancreas: Normal, with no mass or duct dilation. Spleen: Normal size spleen. Cystic 1.0 cm lesion in the central spleen is stable since 06/22/2015, consistent with a benign lesion. No additional splenic lesions. Adrenals/Urinary Tract: No discrete adrenal nodules. There is a 2.7 x 2.5 cm solid hyperdense renal cortical mass in the anterior upper right kidney (series 2/ image 60), which demonstrates de-enhancement on the delayed sequence, and which appears slightly increased in size from 2.5 x 2.2 cm on 06/22/2015. No additional  renal masses. No hydronephrosis. Normal bladder. Stomach/Bowel: Grossly normal stomach. Normal caliber small bowel with no small bowel wall thickening. Appendix not discretely visualized. Status post partial distal colectomy with intact appearing colorectal anastomosis. No appreciable mass or definite wall thickening at the colorectal anastomosis. Vascular/Lymphatic: Normal caliber abdominal aorta. Patent portal, splenic, hepatic and renal veins. No pathologically enlarged lymph nodes in the abdomen or pelvis. Reproductive: Status post hysterectomy, with no abnormal findings at the vaginal cuff. No adnexal mass. Other: No pneumoperitoneum, ascites or focal fluid collection. Tiny fat containing  peri-incisional ventral hernia in the right paramedian lower abdominal wall. Musculoskeletal: No aggressive appearing focal osseous lesions. Marked degenerative disc disease at L5-S1. IMPRESSION: 1. New indistinct hypodense 1.3 cm lateral segment left liver lobe mass, worrisome for liver metastasis. Recommend further characterization with MRI abdomen without and with IV contrast. 2. Solid hyperdense 2.7 cm renal cortical mass in the upper right kidney, worrisome for renal cell carcinoma given the de-enhancement on the delayed sequence. This mass can also be further characterized on MRI abdomen without and with IV contrast. 3. No evidence of local tumor recurrence at the colorectal anastomosis. 4. Left upper lobe 2 mm pulmonary nodule, for which no prior comparison exists. Left lower lobe 4 mm pulmonary nodule is stable for 11 months and probably benign. Recommend follow-up chest CT in 3-6 months. 5. Additional findings include aortic atherosclerosis and tiny fat containing peri-incisional hernia in the ventral lower abdominal wall. Electronically Signed   By: Ilona Sorrel M.D.   On: 06/03/2016 14:01   Mr Abdomen W Wo Contrast  Result Date: 06/10/2016 CLINICAL DATA:  Colon cancer. Indeterminate liver lesions and renal lesions on computer tomography. EXAM: MRI ABDOMEN WITHOUT AND WITH CONTRAST TECHNIQUE: Multiplanar multisequence MR imaging of the abdomen was performed both before and after the administration of intravenous contrast. CONTRAST:  61mL MULTIHANCE GADOBENATE DIMEGLUMINE 529 MG/ML IV SOLN COMPARISON:  CT 06/03/2016 FINDINGS: Lower chest:  Lung bases are clear. Hepatobiliary: In the lateral LEFT hepatic lobe (segment 2) there is a round lesion measuring 19 mm by 16 mm (image 28, series 3. This lesion is just beneath the liver capsule. Type lesion essentially hypo intense on T2 weighted imaging and has an enhancing ri within the posterior aspect of the RIGHT hepatic lobe (segment 6) similar round 16 mm  lesion is noted on image 29, series 3. M peripheral round discontinuous (series 9 12/26/2001. Hepatic steatosis is noted. No biliary duct dilatation. Gallbladder normal. Common bile duct normal. Pancreas: Normal pancreatic parenchymal intensity. No ductal dilatation or inflammation. Spleen: Normal spleen. Adrenals/urinary tract: Adrenal glands normal. Within the upper pole of the RIGHT kidney well-circumscribed rounded mass measures 2.5 by 2.4 cm (image 33, series 6) and is relatively hypo intense on T2 weighted imaging and isointense on T1 weighted imaging. Lesion demonstrates avid homogeneous early postcontrast enhancement (image 42, series 901). Contrast enhancement equal to the normal renal cortex. Stomach/Bowel: Stomach and limited of the small bowel is unremarkable Vascular/Lymphatic: Abdominal aortic normal caliber. No retroperitoneal periportal lymphadenopathy. Musculoskeletal: No aggressive osseous lesion IMPRESSION: 1. Two enhancing lesions in the liver (segment II and segment VI) consistent with hepatic metastasis. 2. Solid homogeneously and intensely enhancing lesion in the anterior cortex of the RIGHT kidney is most consistent with renal cell carcinoma. Electronically Signed   By: Suzy Bouchard M.D.   On: 06/10/2016 13:43  Ct Abdomen Pelvis W Contrast  Result Date: 06/03/2016 CLINICAL DATA:  Restaging sigmoid colon cancer status post partial colectomy 08/09/2015. Elevated  CEA. EXAM: CT CHEST, ABDOMEN, AND PELVIS WITH CONTRAST TECHNIQUE: Multidetector CT imaging of the chest, abdomen and pelvis was performed following the standard protocol during bolus administration of intravenous contrast. CONTRAST:  164mL ISOVUE-300 IOPAMIDOL (ISOVUE-300) INJECTION 61% COMPARISON:  06/22/2015 CT abdomen/ pelvis. 08/02/2015 chest radiograph. No prior chest CT. FINDINGS: CT CHEST Mediastinum/Nodes: Normal heart size. No significant pericardial fluid/thickening. Mildly atherosclerotic nonaneurysmal thoracic aorta.  Normal caliber pulmonary arteries. No central pulmonary emboli. Atrophic appearing thyroid gland. Unremarkable esophagus. No pathologically enlarged axillary, mediastinal or hilar lymph nodes. Lungs/Pleura: No pneumothorax. No pleural effusion. Left upper lobe 2 mm solid pulmonary nodule (series 4/ image 25). Subpleural 4 mm basilar left lower lobe pulmonary nodule (series 4/ image 111) is stable since 06/22/2015. No acute consolidative airspace disease or additional significant pulmonary nodules. Musculoskeletal:  No aggressive appearing focal osseous lesions. CT ABDOMEN AND PELVIS Hepatobiliary: Normal liver size. There is an indistinctly marginated hypodense 1.3 x 1.3 cm liver lesion in the lateral segment left liver lobe (series 2/ image 57), which appears new since 06/22/2015. No additional liver lesions. Normal gallbladder with no radiopaque cholelithiasis. No biliary ductal dilatation. Pancreas: Normal, with no mass or duct dilation. Spleen: Normal size spleen. Cystic 1.0 cm lesion in the central spleen is stable since 06/22/2015, consistent with a benign lesion. No additional splenic lesions. Adrenals/Urinary Tract: No discrete adrenal nodules. There is a 2.7 x 2.5 cm solid hyperdense renal cortical mass in the anterior upper right kidney (series 2/ image 60), which demonstrates de-enhancement on the delayed sequence, and which appears slightly increased in size from 2.5 x 2.2 cm on 06/22/2015. No additional renal masses. No hydronephrosis. Normal bladder. Stomach/Bowel: Grossly normal stomach. Normal caliber small bowel with no small bowel wall thickening. Appendix not discretely visualized. Status post partial distal colectomy with intact appearing colorectal anastomosis. No appreciable mass or definite wall thickening at the colorectal anastomosis. Vascular/Lymphatic: Normal caliber abdominal aorta. Patent portal, splenic, hepatic and renal veins. No pathologically enlarged lymph nodes in the abdomen or  pelvis. Reproductive: Status post hysterectomy, with no abnormal findings at the vaginal cuff. No adnexal mass. Other: No pneumoperitoneum, ascites or focal fluid collection. Tiny fat containing peri-incisional ventral hernia in the right paramedian lower abdominal wall. Musculoskeletal: No aggressive appearing focal osseous lesions. Marked degenerative disc disease at L5-S1. IMPRESSION: 1. New indistinct hypodense 1.3 cm lateral segment left liver lobe mass, worrisome for liver metastasis. Recommend further characterization with MRI abdomen without and with IV contrast. 2. Solid hyperdense 2.7 cm renal cortical mass in the upper right kidney, worrisome for renal cell carcinoma given the de-enhancement on the delayed sequence. This mass can also be further characterized on MRI abdomen without and with IV contrast. 3. No evidence of local tumor recurrence at the colorectal anastomosis. 4. Left upper lobe 2 mm pulmonary nodule, for which no prior comparison exists. Left lower lobe 4 mm pulmonary nodule is stable for 11 months and probably benign. Recommend follow-up chest CT in 3-6 months. 5. Additional findings include aortic atherosclerosis and tiny fat containing peri-incisional hernia in the ventral lower abdominal wall. Electronically Signed   By: Ilona Sorrel M.D.   On: 06/03/2016 14:01    Labs:  CBC:  Recent Labs  08/13/15 0848 09/03/15 1526 02/21/16 1022 05/26/16 0944  WBC 5.0 3.8* 3.7* 4.0  HGB 8.3* 10.3* 13.4 14.4  HCT 26.8* 34.2* 42.4 43.1  PLT 268 432* 218 190    COAGS:  Recent Labs  08/02/15 0940  INR 1.07  BMP:  Recent Labs  08/02/15 0940 08/10/15 0422 08/11/15 0415 05/26/16 0944  NA 141 140 140 139  K 4.0 5.4* 3.9 4.4  CL 106 109 109  --   CO2 24 26 27 24   GLUCOSE 112* 158* 117* 84  BUN 11 6 <5* 17.1  CALCIUM 9.7 8.5* 8.4* 9.4  CREATININE 0.94 0.92 0.65 1.1  GFRNONAA 59* >60 >60  --   GFRAA >60 >60 >60  --     LIVER FUNCTION TESTS:  Recent Labs   08/02/15 0940 05/26/16 0944  BILITOT 0.6 0.39  AST 43* 25  ALT 17 10  ALKPHOS 83 81  PROT 8.1 7.5  ALBUMIN 4.4 3.8    TUMOR MARKERS:  Recent Labs  08/02/15 0940 02/21/16 1022 04/17/16 0952  CEA 13.3* 2.5* 3.1*    Assessment and Plan:  Adenocarcinoma of the sigmoid colon, status post  sigmoid colectomy. Surveillance imaging confirms developed 2 small hepatic metastases. These are amenable to image guided microwave ablation under general anesthesia. The procedure, risks, benefits and alternatives were reviewed. She would like to proceed with scheduling image guided microwave ablation as soon as possible.  2.5 cm right upper pole renal cell carcinoma with very little change over surveillance imaging. Plan for image guided cryoablation one month after successful liver microwave ablation.  Thank you for this interesting consult.  I greatly enjoyed meeting Daionna P Gau and look forward to participating in their care.  A copy of this report was sent to the requesting provider on this date.  Electronically Signed: Greggory Keen 07/01/2016, 11:37 AM   I spent a total of  40 Minutes   in face to face in clinical consultation, greater than 50% of which was counseling/coordinating care for this patient with metastatic colon cancer and a right renal cell carcinoma.

## 2016-07-04 ENCOUNTER — Other Ambulatory Visit: Payer: Self-pay | Admitting: Radiology

## 2016-07-08 NOTE — Patient Instructions (Addendum)
Amirra Blodgett Yount  07/08/2016   Your procedure is scheduled on: 07/25/16  Report to [facility] Main  Entrance to RADIOLOGY  at  0945 AM.  Call this number if you have problems the morning of surgery [phone]   Remember: ONLY 1 PERSON MAY GO WITH YOU TO SHORT STAY TO GET  READY MORNING OF YOUR SURGERY.  Do not eat food or drink liquids :After Midnight.     Take these medicines the morning of surgery with A SIP OF WATER:Levothyroxine May take Tramadol if needed                                 You may not have any metal on your body including hair pins and              piercings  Do not wear jewelry, make-up, lotions, powders or perfumes, deodorant             Do not wear nail polish.  Do not shave  48 hours prior to surgery.              Men may shave face and neck.   Do not bring valuables to the hospital. [place].  Contacts, dentures or bridgework may not be worn into surgery.  Leave suitcase in the car. After surgery it may be brought to your room.             [place] - Preparing for Surgery Before surgery, you can play an important role.  Because skin is not sterile, your skin needs to be as free of germs as possible.  You can reduce the number of germs on your skin by washing with CHG (chlorahexidine gluconate) soap before surgery.  CHG is an antiseptic cleaner which kills germs and bonds with the skin to continue killing germs even after washing. Please DO NOT use if you have an allergy to CHG or antibacterial soaps.  If your skin becomes reddened/irritated stop using the CHG and inform your nurse when you arrive at Short Stay. Do not shave (including legs and underarms) for at least 48 hours prior to the first CHG shower.  You may shave your face/neck. Please follow these instructions carefully:  1.  Shower with CHG Soap the night before surgery and the  morning of Surgery.  2.  If you choose to  wash your hair, wash your hair first as usual with your  normal  shampoo.  3.  After you shampoo, rinse your hair and body thoroughly to remove the  shampoo.                           4.  Use CHG as you would any other liquid soap.  You can apply chg directly  to the skin and wash                       Gently with a scrungie or clean washcloth.  5.  Apply the CHG Soap to your body ONLY FROM THE NECK DOWN.   Do not use on face/ open  Wound or open sores. Avoid contact with eyes, ears mouth and genitals (private parts).                       Wash face,  Genitals (private parts) with your normal soap.             6.  Wash thoroughly, paying special attention to the area where your surgery  will be performed.  7.  Thoroughly rinse your body with warm water from the neck down.  8.  DO NOT shower/wash with your normal soap after using and rinsing off  the CHG Soap.                9.  Pat yourself dry with a clean towel.            10.  Wear clean pajamas.            11.  Place clean sheets on your bed the night of your first shower and do not  sleep with pets. Day of Surgery : Do not apply any lotions/deodorants the morning of surgery.  Please wear clean clothes to the hospital/surgery center.  FAILURE TO FOLLOW THESE INSTRUCTIONS MAY RESULT IN THE CANCELLATION OF YOUR SURGERY PATIENT SIGNATURE_________________________________  NURSE SIGNATURE__________________________________  ________________________________________________________________________

## 2016-07-09 ENCOUNTER — Encounter (HOSPITAL_COMMUNITY): Payer: Self-pay

## 2016-07-09 ENCOUNTER — Encounter (HOSPITAL_COMMUNITY)
Admission: RE | Admit: 2016-07-09 | Discharge: 2016-07-09 | Disposition: A | Discharge status: Home / Self Care | Payer: Medicare Other | Source: Ambulatory Visit | Attending: Interventional Radiology | Admitting: Interventional Radiology

## 2016-07-09 DIAGNOSIS — Z01812 Encounter for preprocedural laboratory examination: Secondary | ICD-10-CM | POA: Diagnosis not present

## 2016-07-09 HISTORY — DX: Unspecified cataract: H26.9

## 2016-07-09 LAB — COMPREHENSIVE METABOLIC PANEL
ALBUMIN: 4.6 g/dL (ref 3.5–5.0)
ALT: 16 U/L (ref 14–54)
ANION GAP: 6 (ref 5–15)
AST: 29 U/L (ref 15–41)
Alkaline Phosphatase: 84 U/L (ref 38–126)
BILIRUBIN TOTAL: 0.5 mg/dL (ref 0.3–1.2)
BUN: 16 mg/dL (ref 6–20)
CHLORIDE: 108 mmol/L (ref 101–111)
CO2: 25 mmol/L (ref 22–32)
Calcium: 9.7 mg/dL (ref 8.9–10.3)
Creatinine, Ser: 1.07 mg/dL — ABNORMAL HIGH (ref 0.44–1.00)
GFR calc Af Amer: 58 mL/min — ABNORMAL LOW (ref >60)
GFR calc non Af Amer: 50 mL/min — ABNORMAL LOW (ref >60)
GLUCOSE: 109 mg/dL — AB (ref 65–99)
POTASSIUM: 5.6 mmol/L — AB (ref 3.5–5.1)
Sodium: 139 mmol/L (ref 135–145)
TOTAL PROTEIN: 7.8 g/dL (ref 6.5–8.1)

## 2016-07-09 LAB — APTT: APTT: 30 s (ref 24–36)

## 2016-07-09 LAB — CBC
HCT: 41.8 % (ref 36.0–46.0)
HEMOGLOBIN: 13.8 g/dL (ref 12.0–15.0)
MCH: 28 pg (ref 26.0–34.0)
MCHC: 33 g/dL (ref 30.0–36.0)
MCV: 85 fL (ref 78.0–100.0)
PLATELETS: 227 10*3/uL (ref 150–400)
RBC: 4.92 MIL/uL (ref 3.87–5.11)
RDW: 14.9 % (ref 11.5–15.5)
WBC: 3.8 10*3/uL — AB (ref 4.0–10.5)

## 2016-07-09 LAB — PROTIME-INR
INR: 1.08
PROTHROMBIN TIME: 14 s (ref 11.4–15.2)

## 2016-07-09 NOTE — Progress Notes (Signed)
Spoke with Dr Ola Spurr, anesthesia,review of history and meds given- stated would see pt am of procedure

## 2016-07-11 ENCOUNTER — Telehealth: Payer: Self-pay | Admitting: *Deleted

## 2016-07-11 ENCOUNTER — Telehealth: Payer: Self-pay | Admitting: Oncology

## 2016-07-11 NOTE — Telephone Encounter (Signed)
Spoke with pt to confirm 9/22 appt at 9 am per LOS

## 2016-07-11 NOTE — Telephone Encounter (Signed)
Oncology Nurse Navigator Documentation  Oncology Nurse Navigator Flowsheets 07/11/2016  Navigator Location [facility]  Navigator Encounter Type Telephone  Telephone Appt Confirmation/Clarification--liver ablation 07/25/16. Patient asking if Dr. Benay Spice needs to see her on 8/31?  Patient Visit Type -  Treatment Phase -  Barriers/Navigation Needs Coordination of Care--needs to reschedule 8/31 to 2 weeks after IR ablation per MD  Education -  Interventions Coordination of Care--LOS to scheduler. Cancelled 8/31 visit.  Coordination of Care Appts  Education Method -  Support Groups/Services -  Acuity -  Time Spent with Patient 15

## 2016-07-14 ENCOUNTER — Encounter: Payer: Self-pay | Admitting: Internal Medicine

## 2016-07-14 ENCOUNTER — Other Ambulatory Visit (INDEPENDENT_AMBULATORY_CARE_PROVIDER_SITE_OTHER): Payer: Medicare Other

## 2016-07-14 ENCOUNTER — Ambulatory Visit (INDEPENDENT_AMBULATORY_CARE_PROVIDER_SITE_OTHER): Payer: Medicare Other | Admitting: Internal Medicine

## 2016-07-14 VITALS — BP 154/96 | HR 69 | Temp 98.0°F | Resp 16 | Ht 68.0 in | Wt 174.0 lb

## 2016-07-14 DIAGNOSIS — M170 Bilateral primary osteoarthritis of knee: Secondary | ICD-10-CM | POA: Diagnosis not present

## 2016-07-14 DIAGNOSIS — E038 Other specified hypothyroidism: Secondary | ICD-10-CM

## 2016-07-14 DIAGNOSIS — C187 Malignant neoplasm of sigmoid colon: Secondary | ICD-10-CM

## 2016-07-14 DIAGNOSIS — M810 Age-related osteoporosis without current pathological fracture: Secondary | ICD-10-CM | POA: Diagnosis not present

## 2016-07-14 DIAGNOSIS — R030 Elevated blood-pressure reading, without diagnosis of hypertension: Secondary | ICD-10-CM

## 2016-07-14 LAB — TSH: TSH: 1.01 u[IU]/mL (ref 0.35–4.50)

## 2016-07-14 NOTE — Progress Notes (Signed)
Subjective:    Patient ID: Nichole Cross, female    DOB: Nov 27, 1940, 75 y.o.   MRN: [id number]  HPI She is here to establish with a new pcp.    Colon cancer:  She had a sigmoid colectomy on 08/09/15.  Last month she had follow up imaging and was found to have mets to the liver and kidney.  She will have ablative therapy next month. She will need chemo after this.   Left lower back pain;  She has been having left lower back pain.  She is unsure why.  She was unsure if it was her kidneys, musculoskeletal or the cancer.   Hypothyroidism:  She is taking her medication daily.  She denies any recent changes weight but does feel more fatigued.  Knee arthritis:  She had one knee replaced and the other one needs to be replaced.  She takes tramadol as needed for her knee arthritis.   Osteoporosis:  She has been taking fosamax for at least 4-5 years, but is unsure how long.  Her last dexa was 2015 and showed osteopenia.        Medications and allergies reviewed with patient and updated if appropriate.  Patient Active Problem List   Diagnosis Date Noted  . Osteoporosis 06/07/2016  . Anemia, iron deficiency 06/07/2016  . Sigmoid colon cancer s/p lap assisted sigmoid colectomy 08/09/15 08/09/2015  . Overweight (BMI 25.0-29.9) 09/10/2012  . S/P left UKR 09/09/2012  . ELEVATED BLOOD PRESSURE WITHOUT DIAGNOSIS OF HYPERTENSION 09/30/2010  . GANGLION OF TENDON SHEATH 12/20/2007  . FATTY LIVER DISEASE 08/13/2007  . HYPOTHYROIDISM 08/02/2007  . CYSTITIS 08/02/2007  . OSTEOARTHRITIS 08/02/2007    Current Outpatient Prescriptions on File Prior to Visit  Medication Sig Dispense Refill  . alendronate (FOSAMAX) 70 MG tablet Take 70 mg by mouth every 7 (seven) days. Take with a full glass of water on an empty stomach. Sunday    . aspirin-acetaminophen-caffeine (EXCEDRIN MIGRAINE) 250-250-65 MG tablet Take 1 tablet by mouth every 6 (six) hours as needed for headache.    . ferrous sulfate 325 (65  FE) MG tablet Take 1 tablet (325 mg total) by mouth 3 (three) times daily after meals. (Patient taking differently: Take 325 mg by mouth 2 (two) times daily with a meal. )    . levothyroxine (SYNTHROID, LEVOTHROID) 137 MCG tablet Take 1 tablet (137 mcg total) by mouth daily before breakfast. 30 tablet 1  . polyethylene glycol (MIRALAX / GLYCOLAX) packet Take 17 g by mouth daily as needed for mild constipation. Reported on 06/05/2016    . traMADol (ULTRAM) 50 MG tablet Take 50 mg by mouth every 6 (six) hours as needed for moderate pain.      No current facility-administered medications on file prior to visit.     Past Medical History:  Diagnosis Date  . Anemia   . Arthritis    knees  . Blood dyscrasia 2016   microcytic anemia, likely iron deficiency anemia secondary to colon cancer  . Cataracts, bilateral   . Colon cancer ([place]) 06/2015   tubular villous adenoma on sigmoid colon specimen (08/09/15), myltiple tubular adenomas removed on a colonoscopy  11/16/15  . Complication of anesthesia   . Headache(784.0)    migraines  . Hyperthyroidism   . PONV (postoperative nausea and vomiting)    NAUSEA  . Renal carcinoma ([place]) 06/2016   ? primary  . Secondary liver cancer ([place]) 06/2016   ? metastasis from colon  Past Surgical History:  Procedure Laterality Date  . ABDOMINAL HYSTERECTOMY  1993   with bilateral BSO  . abdominal tumor  1993  . LAPAROSCOPIC PARTIAL COLECTOMY N/A 08/09/2015   Procedure: LAPAROSCOPIC PARTIAL COLECTOMY;  Surgeon: Jackolyn Confer, MD;  Location: [facility];  Service: General;  Laterality: N/A;  . PARTIAL KNEE ARTHROPLASTY  09/09/2012   Procedure: UNICOMPARTMENTAL KNEE;  Surgeon: Mauri Pole, MD;  Location: [facility];  Service: Orthopedics;  Laterality: Left;    Social History   Social History  . Marital status: Divorced    Spouse name: N/A  . Number of children: N/A  . Years of education: N/A   Social History Main Topics  . Smoking status: Never Smoker  .  Smokeless tobacco: Never Used  . Alcohol use No  . Drug use: No  . Sexual activity: Not Asked   Other Topics Concern  . 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 Colon cancer Neg Hx     Review of Systems  Constitutional: Positive for fatigue. Negative for chills, fever and unexpected weight change.  Respiratory: Negative for cough, shortness of breath and wheezing.   Cardiovascular: Negative for chest pain, palpitations and leg swelling.  Gastrointestinal: Negative for abdominal pain, blood in stool, diarrhea and nausea.       No gerd  Genitourinary: Negative for dysuria, frequency, hematuria and urgency.  Neurological: Negative for dizziness, light-headedness and headaches.       Objective:   Vitals:   07/14/16 1452  BP: (!) 154/96  Pulse: 69  Resp: 16  Temp: 98 F (36.7 C)   Filed Weights   07/14/16 1452  Weight: 174 lb (78.9 kg)   Body mass index is 26.46 kg/m.   Physical Exam Constitutional: She appears well-developed and well-nourished. No distress.  HENT:  Head: Normocephalic and atraumatic.  Right Ear: External ear normal. Normal ear canal and TM Left Ear: External ear normal.  Normal ear canal and TM Mouth/Throat: Oropharynx is clear and moist.  Eyes: Conjunctivae normal.  Neck: Neck supple. No tracheal deviation present. No thyromegaly present.  No carotid bruit  Cardiovascular: Normal rate, regular rhythm and normal heart sounds.   No murmur heard.  No edema. Pulmonary/Chest: Effort normal and breath sounds normal. No respiratory distress. She has no wheezes. She has no rales.  Abdominal: Soft. She exhibits no distension. There is no tenderness.  Lymphadenopathy: She has no cervical  adenopathy.  Skin: Skin is warm and dry. She is not diaphoretic.  Psychiatric: She has a normal mood and affect. Her behavior is normal.         Assessment & Plan:   See Problem List for Assessment and Plan of chronic medical problems.

## 2016-07-14 NOTE — Assessment & Plan Note (Signed)
Check tsh  Titrate med dose if needed  

## 2016-07-14 NOTE — Progress Notes (Signed)
Pre visit review using our clinic review tool, if applicable. No additional management support is needed unless otherwise documented below in the visit note. 

## 2016-07-14 NOTE — Patient Instructions (Addendum)
  Test(s) ordered today. Your results will be released to [place] (or called to you) after review, usually within 72hours after test completion. If any changes need to be made, you will be notified at that same time.  All other Health Maintenance issues reviewed.   All recommended immunizations and age-appropriate screenings are up-to-date or discussed.  No immunizations administered today.   Medications reviewed and updated.  Changes include stopping the fosamax.   Follow up in one year, sooner if needed.

## 2016-07-14 NOTE — Assessment & Plan Note (Signed)
BP elevated here today and has been elevated She is anxious and that may be causing the elevation Advised that she monitor her BP - she will have the nurse at school check it for her No change in medications today

## 2016-07-16 ENCOUNTER — Other Ambulatory Visit: Payer: Self-pay | Admitting: Emergency Medicine

## 2016-07-16 MED ORDER — LEVOTHYROXINE SODIUM 137 MCG PO TABS: 137.0000 ug | ORAL_TABLET | Freq: Every day | ORAL | 3 refills | Status: DC

## 2016-07-17 ENCOUNTER — Ambulatory Visit: Payer: Medicare Other | Admitting: Oncology

## 2016-07-23 ENCOUNTER — Other Ambulatory Visit: Payer: Self-pay | Admitting: Radiology

## 2016-07-24 ENCOUNTER — Other Ambulatory Visit: Payer: Self-pay | Admitting: General Surgery

## 2016-07-24 ENCOUNTER — Other Ambulatory Visit: Payer: Self-pay | Admitting: Radiology

## 2016-07-25 ENCOUNTER — Ambulatory Visit (HOSPITAL_COMMUNITY)
Admission: RE | Admit: 2016-07-25 | Discharge: 2016-07-25 | Disposition: A | Discharge status: Home / Self Care | Payer: Medicare Other | Source: Ambulatory Visit | Attending: Interventional Radiology | Admitting: Interventional Radiology

## 2016-07-25 ENCOUNTER — Ambulatory Visit (HOSPITAL_COMMUNITY): Payer: Medicare Other | Admitting: Certified Registered"

## 2016-07-25 ENCOUNTER — Ambulatory Visit (HOSPITAL_COMMUNITY): Payer: Medicare Other

## 2016-07-25 ENCOUNTER — Observation Stay (HOSPITAL_COMMUNITY)
Admission: RE | Admit: 2016-07-25 | Discharge: 2016-07-26 | Disposition: A | Discharge status: Home / Self Care | Payer: Medicare Other | Source: Ambulatory Visit | Attending: Interventional Radiology | Admitting: Interventional Radiology

## 2016-07-25 ENCOUNTER — Encounter (HOSPITAL_COMMUNITY): Payer: Self-pay | Admitting: General Practice

## 2016-07-25 ENCOUNTER — Encounter (HOSPITAL_COMMUNITY): Payer: Self-pay

## 2016-07-25 ENCOUNTER — Encounter (HOSPITAL_COMMUNITY)
Admission: RE | Disposition: A | Discharge status: Home / Self Care | Payer: Self-pay | Source: Ambulatory Visit | Attending: Interventional Radiology

## 2016-07-25 DIAGNOSIS — Z01818 Encounter for other preprocedural examination: Secondary | ICD-10-CM | POA: Diagnosis not present

## 2016-07-25 DIAGNOSIS — Z79899 Other long term (current) drug therapy: Secondary | ICD-10-CM | POA: Insufficient documentation

## 2016-07-25 DIAGNOSIS — C189 Malignant neoplasm of colon, unspecified: Secondary | ICD-10-CM | POA: Diagnosis present

## 2016-07-25 DIAGNOSIS — Z7982 Long term (current) use of aspirin: Secondary | ICD-10-CM | POA: Insufficient documentation

## 2016-07-25 DIAGNOSIS — C787 Secondary malignant neoplasm of liver and intrahepatic bile duct: Principal | ICD-10-CM | POA: Insufficient documentation

## 2016-07-25 DIAGNOSIS — E039 Hypothyroidism, unspecified: Secondary | ICD-10-CM | POA: Diagnosis not present

## 2016-07-25 DIAGNOSIS — C785 Secondary malignant neoplasm of large intestine and rectum: Secondary | ICD-10-CM | POA: Insufficient documentation

## 2016-07-25 LAB — COMPREHENSIVE METABOLIC PANEL
ALBUMIN: 4.4 g/dL (ref 3.5–5.0)
ALK PHOS: 78 U/L (ref 38–126)
ALT: 14 U/L (ref 14–54)
ANION GAP: 7 (ref 5–15)
AST: 27 U/L (ref 15–41)
BUN: 13 mg/dL (ref 6–20)
CALCIUM: 9.4 mg/dL (ref 8.9–10.3)
CHLORIDE: 109 mmol/L (ref 101–111)
CO2: 24 mmol/L (ref 22–32)
Creatinine, Ser: 0.79 mg/dL (ref 0.44–1.00)
GFR calc Af Amer: >60 mL/min (ref >60)
GFR calc non Af Amer: >60 mL/min (ref >60)
GLUCOSE: 104 mg/dL — AB (ref 65–99)
POTASSIUM: 3.8 mmol/L (ref 3.5–5.1)
SODIUM: 140 mmol/L (ref 135–145)
Total Bilirubin: 0.5 mg/dL (ref 0.3–1.2)
Total Protein: 7.8 g/dL (ref 6.5–8.1)

## 2016-07-25 LAB — TYPE AND SCREEN
ABO/RH(D): O POS
Antibody Screen: NEGATIVE

## 2016-07-25 SURGERY — RADIO FREQUENCY ABLATION
Anesthesia: General

## 2016-07-25 MED ORDER — KETOROLAC TROMETHAMINE 30 MG/ML IJ SOLN: 30.0000 mg | Freq: Four times a day (QID) | INTRAMUSCULAR | Status: DC | PRN

## 2016-07-25 MED ORDER — FENTANYL CITRATE (PF) 100 MCG/2ML IJ SOLN
INTRAMUSCULAR | Status: AC
  Filled 2016-07-25: qty 6

## 2016-07-25 MED ORDER — IOPAMIDOL (ISOVUE-300) INJECTION 61%
100.0000 mL | Freq: Once | INTRAVENOUS | Status: AC | PRN
  Administered 2016-07-25: 100 mL via INTRAVENOUS

## 2016-07-25 MED ORDER — ROCURONIUM BROMIDE 10 MG/ML (PF) SYRINGE
PREFILLED_SYRINGE | INTRAVENOUS | Status: DC | PRN
  Administered 2016-07-25 (×2): 10 mg via INTRAVENOUS
  Administered 2016-07-25: 50 mg via INTRAVENOUS
  Administered 2016-07-25: 10 mg via INTRAVENOUS

## 2016-07-25 MED ORDER — LACTATED RINGERS IV SOLN
INTRAVENOUS | Status: DC
  Administered 2016-07-25 – 2016-07-26 (×3): via INTRAVENOUS

## 2016-07-25 MED ORDER — LEVOTHYROXINE SODIUM 112 MCG PO TABS
137.0000 ug | ORAL_TABLET | Freq: Every day | ORAL | Status: DC
  Administered 2016-07-26: 137 ug via ORAL
  Filled 2016-07-25: qty 1

## 2016-07-25 MED ORDER — MIDAZOLAM HCL 2 MG/2ML IJ SOLN
INTRAMUSCULAR | Status: AC
  Filled 2016-07-25: qty 2

## 2016-07-25 MED ORDER — HYDROMORPHONE HCL 1 MG/ML IJ SOLN
INTRAMUSCULAR | Status: AC
  Filled 2016-07-25: qty 1

## 2016-07-25 MED ORDER — SENNOSIDES-DOCUSATE SODIUM 8.6-50 MG PO TABS: 1.0000 | ORAL_TABLET | Freq: Every evening | ORAL | Status: DC | PRN

## 2016-07-25 MED ORDER — PIPERACILLIN-TAZOBACTAM 3.375 G IVPB
3.3750 g | Freq: Three times a day (TID) | INTRAVENOUS | Status: AC
  Administered 2016-07-25 – 2016-07-26 (×3): 3.375 g via INTRAVENOUS
  Filled 2016-07-25 (×3): qty 50

## 2016-07-25 MED ORDER — LIDOCAINE 2% (20 MG/ML) 5 ML SYRINGE
INTRAMUSCULAR | Status: DC | PRN
  Administered 2016-07-25: 50 mg via INTRAVENOUS

## 2016-07-25 MED ORDER — KETOROLAC TROMETHAMINE 30 MG/ML IJ SOLN
INTRAMUSCULAR | Status: DC | PRN
  Administered 2016-07-25: 30 mg via INTRAVENOUS

## 2016-07-25 MED ORDER — FENTANYL CITRATE (PF) 100 MCG/2ML IJ SOLN
INTRAMUSCULAR | Status: DC | PRN
  Administered 2016-07-25 (×4): 50 ug via INTRAVENOUS

## 2016-07-25 MED ORDER — PROMETHAZINE HCL 25 MG/ML IJ SOLN: 6.2500 mg | INTRAMUSCULAR | Status: DC | PRN

## 2016-07-25 MED ORDER — POLYETHYLENE GLYCOL 3350 17 G PO PACK: 17.0000 g | PACK | Freq: Every day | ORAL | Status: DC | PRN

## 2016-07-25 MED ORDER — DOCUSATE SODIUM 100 MG PO CAPS
100.0000 mg | ORAL_CAPSULE | Freq: Two times a day (BID) | ORAL | Status: DC
  Administered 2016-07-26: 100 mg via ORAL
  Filled 2016-07-25: qty 1

## 2016-07-25 MED ORDER — PROPOFOL 10 MG/ML IV BOLUS
INTRAVENOUS | Status: DC | PRN
  Administered 2016-07-25: 130 mg via INTRAVENOUS

## 2016-07-25 MED ORDER — HYDROMORPHONE HCL 1 MG/ML IJ SOLN
0.2500 mg | INTRAMUSCULAR | Status: DC | PRN
  Administered 2016-07-25: 0.5 mg via INTRAVENOUS

## 2016-07-25 MED ORDER — ONDANSETRON HCL 4 MG/2ML IJ SOLN
4.0000 mg | Freq: Four times a day (QID) | INTRAMUSCULAR | Status: DC | PRN
  Administered 2016-07-25 (×2): 4 mg via INTRAVENOUS
  Filled 2016-07-25 (×2): qty 2

## 2016-07-25 MED ORDER — SUGAMMADEX SODIUM 200 MG/2ML IV SOLN
INTRAVENOUS | Status: DC | PRN
  Administered 2016-07-25: 200 mg via INTRAVENOUS

## 2016-07-25 MED ORDER — HYDROCODONE-ACETAMINOPHEN 5-325 MG PO TABS
1.0000 | ORAL_TABLET | ORAL | Status: DC | PRN
  Administered 2016-07-25: 1 via ORAL
  Filled 2016-07-25: qty 1

## 2016-07-25 MED ORDER — EPHEDRINE SULFATE-NACL 50-0.9 MG/10ML-% IV SOSY
PREFILLED_SYRINGE | INTRAVENOUS | Status: DC | PRN
Start: 2016-07-25 — End: 2016-07-25
  Administered 2016-07-25: 5 mg via INTRAVENOUS

## 2016-07-25 MED ORDER — HYDROMORPHONE HCL 1 MG/ML IJ SOLN
1.0000 mg | INTRAMUSCULAR | Status: DC | PRN
  Administered 2016-07-25 – 2016-07-26 (×4): 1 mg via INTRAVENOUS
  Filled 2016-07-25 (×4): qty 1

## 2016-07-25 MED ORDER — CEFAZOLIN SODIUM-DEXTROSE 2-4 GM/100ML-% IV SOLN
2.0000 g | Freq: Once | INTRAVENOUS | Status: AC
  Administered 2016-07-25: 2 g via INTRAVENOUS
  Filled 2016-07-25 (×4): qty 100

## 2016-07-25 MED ORDER — DEXAMETHASONE SODIUM PHOSPHATE 10 MG/ML IJ SOLN
INTRAMUSCULAR | Status: DC | PRN
  Administered 2016-07-25: 10 mg via INTRAVENOUS

## 2016-07-25 MED ORDER — ONDANSETRON HCL 4 MG/2ML IJ SOLN
INTRAMUSCULAR | Status: DC | PRN
  Administered 2016-07-25: 4 mg via INTRAVENOUS

## 2016-07-25 MED ORDER — MIDAZOLAM HCL 5 MG/5ML IJ SOLN
INTRAMUSCULAR | Status: DC | PRN
  Administered 2016-07-25 (×2): 1 mg via INTRAVENOUS

## 2016-07-25 MED ORDER — DIPHENHYDRAMINE HCL 50 MG/ML IJ SOLN: 25.0000 mg | Freq: Four times a day (QID) | INTRAMUSCULAR | Status: DC | PRN

## 2016-07-25 NOTE — Procedures (Signed)
Interventional Radiology Procedure Note  Procedure: CT guided ablation of two separate liver metastases.   Complications: None  Estimated Blood Loss: None  Recommendations: - admit for obs - ADAT - Pain control - Labs in am  Signed,  Criselda Peaches, MD

## 2016-07-25 NOTE — Anesthesia Preprocedure Evaluation (Signed)
Anesthesia Evaluation  Patient identified by MRN, date of birth, ID band Patient awake    Reviewed: Allergy & Precautions, NPO status , Patient's Chart, lab work & pertinent test results  Airway Mallampati: II  TM Distance: >3 FB Neck ROM: Full    Dental no notable dental hx.    Pulmonary neg pulmonary ROS,    Pulmonary exam normal breath sounds clear to auscultation       Cardiovascular negative cardio ROS Normal cardiovascular exam Rhythm:Regular Rate:Normal     Neuro/Psych negative neurological ROS  negative psych ROS   GI/Hepatic negative GI ROS, Neg liver ROS,   Endo/Other  Hypothyroidism   Renal/GU negative Renal ROS  negative genitourinary   Musculoskeletal negative musculoskeletal ROS (+)   Abdominal   Peds negative pediatric ROS (+)  Hematology negative hematology ROS (+)   Anesthesia Other Findings   Reproductive/Obstetrics negative OB ROS                             Anesthesia Physical Anesthesia Plan  ASA: II  Anesthesia Plan: General   Post-op Pain Management:    Induction: Intravenous  Airway Management Planned: Oral ETT  Additional Equipment:   Intra-op Plan:   Post-operative Plan: Extubation in OR  Informed Consent: I have reviewed the patients History and Physical, chart, labs and discussed the procedure including the risks, benefits and alternatives for the proposed anesthesia with the patient or authorized representative who has indicated his/her understanding and acceptance.   Dental advisory given  Plan Discussed with: CRNA and Surgeon  Anesthesia Plan Comments:         Anesthesia Quick Evaluation  

## 2016-07-25 NOTE — H&P (Signed)
Patient ID: Nichole Cross, female   DOB: 1941-09-29, 75 y.o.   MRN: [id number]    Referring Physician(s): Sherrill,B  Supervising Physician: Jacqulynn Cadet  Patient Status:  Outpatient TBA  Chief Complaint:  metastatic colon cancer to the liver   Subjective: Pt familiar to IR service from prior consultation with Dr. Annamaria Boots on 07/01/16 to discuss treatment options for  2 enhancing lesions in segments 2 and 6 of the liver consistent with metastatic disease from primary colon cancer. She also has a history of an enhancing lesion in the anterior cortex of the right kidney that is most consistent with renal cell carcinoma. Following this consultation she was deemed a candidate for CT-guided percutaneous thermal ablation of the liver lesions and presents today for the above procedure. See full consult note for additional medical history. She currently denies fever, headache, chest pain, dyspnea, cough, abdominal pain, nausea, vomiting or abnormal bleeding. She does have some intermittent back pain and feels a little bloated. Past Medical History:  Diagnosis Date  . Anemia   . Arthritis    knees  . Blood dyscrasia 2016   microcytic anemia, likely iron deficiency anemia secondary to colon cancer  . Cataracts, bilateral   . Colon cancer ([place]) 06/2015   tubular villous adenoma on sigmoid colon specimen (08/09/15), myltiple tubular adenomas removed on a colonoscopy  11/16/15  . Complication of anesthesia   . Headache(784.0)    migraines  . Hyperthyroidism   . PONV (postoperative nausea and vomiting)    NAUSEA  . Renal carcinoma ([place]) 06/2016   ? primary  . Secondary liver cancer ([place]) 06/2016   ? metastasis from colon   Past Surgical History:  Procedure Laterality Date  . ABDOMINAL HYSTERECTOMY  1993   with bilateral BSO  . abdominal tumor  1993  . LAPAROSCOPIC PARTIAL COLECTOMY N/A 08/09/2015   Procedure: LAPAROSCOPIC PARTIAL COLECTOMY;  Surgeon: Jackolyn Confer, MD;  Location: [facility];  Service: General;  Laterality: N/A;  . PARTIAL KNEE ARTHROPLASTY  09/09/2012   Procedure: UNICOMPARTMENTAL KNEE;  Surgeon: Mauri Pole, MD;  Location: [facility];  Service: Orthopedics;  Laterality: Left;      Allergies: Aspirin  Medications: Prior to Admission medications   Medication Sig Start Date End Date Taking? Authorizing Provider  aspirin-acetaminophen-caffeine (EXCEDRIN MIGRAINE) [phone] MG tablet Take 1 tablet by mouth every 6 (six) hours as needed for headache.   Yes Historical Provider, MD  ferrous sulfate 325 (65 FE) MG tablet Take 1 tablet (325 mg total) by mouth 3 (three) times daily after meals. Patient taking differently: Take 325 mg by mouth 2 (two) times daily with a meal.  09/10/12  Yes Danae Orleans, PA-C  levothyroxine (SYNTHROID, LEVOTHROID) 137 MCG tablet Take 1 tablet (137 mcg total) by mouth daily before breakfast. 07/16/16  Yes [name], MD  polyethylene glycol (MIRALAX / GLYCOLAX) packet Take 17 g by mouth daily as needed for mild constipation. Reported on 06/05/2016   Yes Historical Provider, MD  traMADol (ULTRAM) 50 MG tablet Take 50 mg by mouth every 6 (six) hours as needed for moderate pain.  08/31/15  Yes Historical Provider, MD     Vital Signs: BP 134/66 (BP Location: Right Arm)   Pulse 64   Temp 97.8 F (36.6 C) (Oral)   Resp 16   Ht 5\' 8"  (1.727 m)   Wt 174 lb (78.9 kg)   SpO2 100%   BMI 26.46 kg/m   Physical Exam patient awake, alert. Chest clear  to auscultation bilaterally. Heart with regular rate and rhythm. Abdomen soft, positive bowel sounds, nontender. Lower extremities with no edema.  Imaging: Dg Chest 1 View  Result Date: 07/25/2016 CLINICAL DATA:  Preop for liver ablation.  Colon cancer.  Nonsmoker. EXAM: CHEST 1 VIEW COMPARISON:  CT 06/03/2016 FINDINGS: Midline trachea.  Normal heart size and mediastinal contours. Sharp costophrenic angles.  No pneumothorax.  Clear lungs. Mild left hemidiaphragm elevation. IMPRESSION: No  active disease. Electronically Signed   By: Abigail Miyamoto M.D.   On: 07/25/2016 10:18    Labs:  CBC:  Recent Labs  09/03/15 1526 02/21/16 1022 05/26/16 0944 07/09/16 1140  WBC 3.8* 3.7* 4.0 3.8*  HGB 10.3* 13.4 14.4 13.8  HCT 34.2* 42.4 43.1 41.8  PLT 432* 218 190 227    COAGS:  Recent Labs  08/02/15 0940 07/09/16 1140  INR 1.07 1.08  APTT  --  30    BMP:  Recent Labs  08/02/15 0940 08/10/15 0422 08/11/15 0415 05/26/16 0944 07/09/16 1140  NA 141 140 140 139 139  K 4.0 5.4* 3.9 4.4 5.6*  CL 106 109 109  --  108  CO2 24 26 27 24 25   GLUCOSE 112* 158* 117* 84 109*  BUN 11 6 <5* 17.1 16  CALCIUM 9.7 8.5* 8.4* 9.4 9.7  CREATININE 0.94 0.92 0.65 1.1 1.07*  GFRNONAA 59* >60 >60  --  50*  GFRAA >60 >60 >60  --  58*    LIVER FUNCTION TESTS:  Recent Labs  08/02/15 0940 05/26/16 0944 07/09/16 1140  BILITOT 0.6 0.39 0.5  AST 43* 25 29  ALT 17 10 16   ALKPHOS 83 81 84  PROT 8.1 7.5 7.8  ALBUMIN 4.4 3.8 4.6    Assessment and Plan: Patient with history of adenocarcinoma sigmoid colon, status post sigmoid colectomy 07/2015; recent follow-up imaging has revealed 2 enhancing lesions in segments 2 and 6 of the liver consistent with metastatic disease, enhancing lesion in the anterior cortex of the right kidney concerning for renal cell carcinoma as well as left lung nodules. Seen in consultation recently by Dr. Annamaria Boots and deemed an appropriate candidate for CT-guided thermal ablation of the liver lesions. She presents today for the procedure. Details/risks of procedure, including but not limited to, internal bleeding, infection, anesthesia-related complications, injury to adjacent structures discussed with patient and family with their understanding and consent. Right renal mass treatment will be assessed at a later date. Following the above procedure she will be admitted for overnight observation.   Electronically Signed: D. Rowe Robert 07/25/2016, 10:28 AM   I  spent a total of 30 minutes at the the patient's bedside AND on the patient's hospital floor or unit, greater than 50% of which was counseling/coordinating care for CT-guided thermal ablation of liver lesions

## 2016-07-25 NOTE — Anesthesia Postprocedure Evaluation (Signed)
Anesthesia Post Note  Patient: Nichole Cross  Procedure(s) Performed: Procedure(s) (LRB): microwave ABLATION liver (N/A)  Patient location during evaluation: PACU Anesthesia Type: General Level of consciousness: awake and alert Pain management: pain level controlled Vital Signs Assessment: post-procedure vital signs reviewed and stable Respiratory status: spontaneous breathing, nonlabored ventilation, respiratory function stable and patient connected to nasal cannula oxygen Cardiovascular status: blood pressure returned to baseline and stable Postop Assessment: no signs of nausea or vomiting Anesthetic complications: no    Last Vitals:  Vitals:   07/25/16 1521 07/25/16 1530  BP: (!) 143/77 (!) 146/82  Pulse: 70   Resp: 14 12  Temp: 36.4 C     Last Pain:  Vitals:   07/25/16 1530  TempSrc:   PainSc: 6                  Sayvon Arterberry S

## 2016-07-25 NOTE — Transfer of Care (Signed)
Immediate Anesthesia Transfer of Care Note  Patient: Nichole Cross  Procedure(s) Performed: Procedure(s): microwave ABLATION liver (N/A)  Patient Location: PACU  Anesthesia Type:General  Level of Consciousness: awake, alert , oriented and patient cooperative  Airway & Oxygen Therapy: Patient Spontanous Breathing and Patient connected to face mask oxygen  Post-op Assessment: Report given to RN and Post -op Vital signs reviewed and stable  Post vital signs: Reviewed and stable  Last Vitals:  Vitals:   07/25/16 0958  BP: 134/66  Pulse: 64  Resp: 16  Temp: 36.6 C    Last Pain:  Vitals:   07/25/16 0958  TempSrc: Oral         Complications: No apparent anesthesia complications

## 2016-07-25 NOTE — Anesthesia Procedure Notes (Signed)
Procedure Name: Intubation Date/Time: 07/25/2016 12:41 PM Performed by: Lajuana Carry E Pre-anesthesia Checklist: Patient identified, Emergency Drugs available, Suction available and Patient being monitored Patient Re-evaluated:Patient Re-evaluated prior to inductionOxygen Delivery Method: Circle system utilized Preoxygenation: Pre-oxygenation with 100% oxygen Intubation Type: IV induction Ventilation: Mask ventilation without difficulty Laryngoscope Size: Mac and 3 Grade View: Grade I Tube type: Oral Tube size: 7.0 mm Number of attempts: 1 Airway Equipment and Method: Stylet Placement Confirmation: ETT inserted through vocal cords under direct vision,  positive ETCO2 and breath sounds checked- equal and bilateral Secured at: 21 cm Tube secured with: Tape Dental Injury: Teeth and Oropharynx as per pre-operative assessment

## 2016-07-25 NOTE — Progress Notes (Signed)
Patient ID: Nichole Cross, female   DOB: 1941/09/29, 75 y.o.   MRN: [id number] Patient just now leaving PACU to go to floor; complaining of some mild to moderate epigastric discomfort; denies nausea /vomiting Vital signs stable, afebrile Patient awake, alert. Upper abdominal region mildly tender to palpation Status post CT-guided thermal ablation of 2 separate liver metastasis (colon ca primary) earlier today- for overnight obs, check a.m. labs, Dilaudid for pain, D/C [place] later tonight; follow[place] Laurence [place] [facility] in 2-4 weeks; Dr. Annamaria Boots will address treatment of right renal mass at later date   Rowe Robert, [facility]

## 2016-07-26 DIAGNOSIS — C787 Secondary malignant neoplasm of liver and intrahepatic bile duct: Secondary | ICD-10-CM | POA: Diagnosis not present

## 2016-07-26 LAB — COMPREHENSIVE METABOLIC PANEL
ALBUMIN: 3.8 g/dL (ref 3.5–5.0)
ALK PHOS: 65 U/L (ref 38–126)
ALT: 44 U/L (ref 14–54)
ANION GAP: 6 (ref 5–15)
AST: 98 U/L — ABNORMAL HIGH (ref 15–41)
BILIRUBIN TOTAL: 0.6 mg/dL (ref 0.3–1.2)
BUN: 13 mg/dL (ref 6–20)
CALCIUM: 8.9 mg/dL (ref 8.9–10.3)
CO2: 25 mmol/L (ref 22–32)
CREATININE: 0.91 mg/dL (ref 0.44–1.00)
Chloride: 104 mmol/L (ref 101–111)
GFR calc Af Amer: >60 mL/min (ref >60)
GFR calc non Af Amer: >60 mL/min (ref >60)
GLUCOSE: 131 mg/dL — AB (ref 65–99)
Potassium: 4.9 mmol/L (ref 3.5–5.1)
SODIUM: 135 mmol/L (ref 135–145)
TOTAL PROTEIN: 6.6 g/dL (ref 6.5–8.1)

## 2016-07-26 LAB — CBC
HCT: 36 % (ref 36.0–46.0)
HEMOGLOBIN: 12.1 g/dL (ref 12.0–15.0)
MCH: 28.3 pg (ref 26.0–34.0)
MCHC: 33.6 g/dL (ref 30.0–36.0)
MCV: 84.1 fL (ref 78.0–100.0)
PLATELETS: 184 10*3/uL (ref 150–400)
RBC: 4.28 MIL/uL (ref 3.87–5.11)
RDW: 14.3 % (ref 11.5–15.5)
WBC: 6.3 10*3/uL (ref 4.0–10.5)

## 2016-07-26 NOTE — Discharge Summary (Signed)
Patient ID: Nichole Cross MRN: [id number] DOB/AGE: 1941-02-20 75 y.o.  Admit date: 07/25/2016 Discharge date: 07/26/2016  Supervising Physician: Jacqulynn Cadet  Admission Diagnoses: Colon Ca metastasis to liver  Discharge Diagnoses:  Active Problems:   Adenocarcinoma of colon metastatic to liver [facility])   Discharged Condition: stable  Hospital Course:  2 enhancing lesions in segments 2 and 6 of the liver consistent with metastatic disease from primary colon cancer. Was consulted with IR for treatment of same. Ablation performed in IR with [facility] 07/25/16. No complication; pt tolerated well Overnight observation was without event. Slept well Denies pain; denies N/V Urinating on own; good output---yellow urine I have seen and examined pt I have discussed status with Dr Earleen Newport Plan for discharge to home  Renal lesion treatment will be addressed with Dr Annamaria Boots at later date; pt aware and agreeable  Consults: None  Significant Diagnostic Studies: CT Abdomen  Treatments: Interventional Radiology Procedure Note Procedure: CT guided ablation of two separate liver metastases.   Discharge Exam: Blood pressure (!) 109/57, pulse (!) 59, temperature 97.6 F (36.4 C), temperature source Oral, resp. rate 16, height 5\' 8"  (1.727 m), weight 176 lb 2.4 oz (79.9 kg), SpO2 94 %. PE: A/O Ple[place] Neuro intact Heart RRR Lungs CTA Abd: soft +BS NT no masses Sites clean and dry; NT No hematoma Ext: FROM; steady UOP great; yellow  Results for orders placed or performed during the hospital encounter of 07/25/16  Comprehensive metabolic panel  Result Value Ref Range   Sodium 140 135 - 145 mmol/L   Potassium 3.8 3.5 - 5.1 mmol/L   Chloride 109 101 - 111 mmol/L   CO2 24 22 - 32 mmol/L   Glucose, Bld 104 (H) 65 - 99 mg/dL   BUN 13 6 - 20 mg/dL   Creatinine, Ser 0.79 0.44 - 1.00 mg/dL   Calcium 9.4 8.9 - 10.3 mg/dL   Total Protein 7.8 6.5 - 8.1 g/dL   Albumin 4.4 3.5 - 5.0  g/dL   AST 27 15 - 41 U/L   ALT 14 14 - 54 U/L   Alkaline Phosphatase 78 38 - 126 U/L   Total Bilirubin 0.5 0.3 - 1.2 mg/dL   GFR calc non Af Amer >60 >60 mL/min   GFR calc Af Amer >60 >60 mL/min   Anion gap 7 5 - 15  Comprehensive metabolic panel  Result Value Ref Range   Sodium 135 135 - 145 mmol/L   Potassium 4.9 3.5 - 5.1 mmol/L   Chloride 104 101 - 111 mmol/L   CO2 25 22 - 32 mmol/L   Glucose, Bld 131 (H) 65 - 99 mg/dL   BUN 13 6 - 20 mg/dL   Creatinine, Ser 0.91 0.44 - 1.00 mg/dL   Calcium 8.9 8.9 - 10.3 mg/dL   Total Protein 6.6 6.5 - 8.1 g/dL   Albumin 3.8 3.5 - 5.0 g/dL   AST 98 (H) 15 - 41 U/L   ALT 44 14 - 54 U/L   Alkaline Phosphatase 65 38 - 126 U/L   Total Bilirubin 0.6 0.3 - 1.2 mg/dL   GFR calc non Af Amer >60 >60 mL/min   GFR calc Af Amer >60 >60 mL/min   Anion gap 6 5 - 15  CBC  Result Value Ref Range   WBC 6.3 4.0 - 10.5 K/uL   RBC 4.28 3.87 - 5.11 MIL/uL   Hemoglobin 12.1 12.0 - 15.0 g/dL   HCT 36.0 36.0 - 46.0 %  MCV 84.1 78.0 - 100.0 fL   MCH 28.3 26.0 - 34.0 pg   MCHC 33.6 30.0 - 36.0 g/dL   RDW 14.3 11.5 - 15.5 %   Platelets 184 150 - 400 K/uL  Type and screen Wilber  Result Value Ref Range   ABO/RH(D) O POS    Antibody Screen NEG    Sample Expiration 07/28/2016     Disposition:  Colon Ca mets to liver Liver lesion x 2 ablation in IR with Dr Laurence Ferrari [id number] No complications Overnight observation without event Continue all home meds Pt has good understanding of discharge instructions Plan for 2-4 week follow up with Dr Emmit Alexanders will hear from scheduler for time and date  Discharge Instructions    Call MD for:  difficulty breathing, headache or visual disturbances    Complete by:  As directed   Call MD for:  hives    Complete by:  As directed   Call MD for:  persistant dizziness or light-headedness    Complete by:  As directed   Call MD for:  persistant nausea and vomiting    Complete by:  As directed    Call MD for:  redness, tenderness, or signs of infection (pain, swelling, redness, odor or green/yellow discharge around incision site)    Complete by:  As directed   Call MD for:  severe uncontrolled pain    Complete by:  As directed   Call MD for:  temperature >100.4    Complete by:  As directed   Diet - low sodium heart healthy    Complete by:  As directed   Discharge instructions    Complete by:  As directed   Cont all home meds; follow up appt 2-4 weeks with Dr Laurence Ferrari; pt will hear from scheduler for time and date; call [phone] if questions or concerns   Discharge wound care:    Complete by:  As directed   May shower today; keep clean band aid on RUQ sites daily x 1 week   Driving Restrictions    Complete by:  As directed   No driving 3-5 days   Increase activity slowly    Complete by:  As directed   Lifting restrictions    Complete by:  As directed   No lifting over 10 lbs x 3 days       Medication List    TAKE these medications   aspirin-acetaminophen-caffeine 250-250-65 MG tablet Commonly known as:  EXCEDRIN MIGRAINE Take 1 tablet by mouth every 6 (six) hours as needed for headache.   ferrous sulfate 325 (65 FE) MG tablet Take 1 tablet (325 mg total) by mouth 3 (three) times daily after meals. What changed:  when to take this   levothyroxine 137 MCG tablet Commonly known as:  SYNTHROID, LEVOTHROID Take 1 tablet (137 mcg total) by mouth daily before breakfast.   polyethylene glycol packet Commonly known as:  MIRALAX / GLYCOLAX Take 17 g by mouth daily as needed for mild constipation. Reported on 06/05/2016   traMADol 50 MG tablet Commonly known as:  ULTRAM Take 50 mg by mouth every 6 (six) hours as needed for moderate pain.      Follow-up Information    MCCULLOUGH, HEATH, MD Follow up in 3 week(s).   Specialties:  Interventional Radiology, Radiology Why:  follw up 2-4 weeks with Dr Laurence Ferrari; scheduler will call pt with time and date; call [phone] if  questions or concerns Contact information: [address] [place] [address] [id number]            Electronically Signed: Monia Sabal A 07/26/2016, 9:16 AM   I have spent Greater Than 30 Minutes discharging Nichole Cross.

## 2016-07-26 NOTE — Progress Notes (Signed)
Patient d/c home. Stable. In good spirits. 

## 2016-07-26 NOTE — Progress Notes (Signed)
Discharge instructions given to patient, follow-up appointments discussed, verbalized understanding. Dsg to abdomen placed this am,and-aid intact. All questions answered apropriately. Patient denies pain. Ambulated to the BR this am.Voided without difficulty. Stable.

## 2016-07-29 ENCOUNTER — Telehealth: Payer: Self-pay

## 2016-07-29 ENCOUNTER — Other Ambulatory Visit (HOSPITAL_COMMUNITY): Payer: Self-pay | Admitting: Interventional Radiology

## 2016-07-29 DIAGNOSIS — C787 Secondary malignant neoplasm of liver and intrahepatic bile duct: Secondary | ICD-10-CM

## 2016-07-29 NOTE — Telephone Encounter (Signed)
After several phone conversations, I confirmed with Bhakti that she has a 3-week F/U thermal ablation of liver mets appt with Dr. Laurence Ferrari on Thursday, 9/28//17 at 10:15.  She has an appt with Dr. Nicolette Bang (urology; [phone]) on Thursday, 09/11/16 at 3:00.  We are putting on hold her renal cryo until after she sees Dr. Alyson Ingles.  Tiffany at [facility] IR already has cancelled this procedure (was 08/13/16) and the pre-op visit at [facility] (was 08/06/16).  Dibbie is aware of all of this.  Brita Romp, RN

## 2016-08-06 ENCOUNTER — Other Ambulatory Visit (HOSPITAL_COMMUNITY): Payer: Medicare Other

## 2016-08-08 ENCOUNTER — Ambulatory Visit (HOSPITAL_BASED_OUTPATIENT_CLINIC_OR_DEPARTMENT_OTHER): Payer: Medicare Other | Admitting: Oncology

## 2016-08-08 ENCOUNTER — Telehealth: Payer: Self-pay | Admitting: Oncology

## 2016-08-08 ENCOUNTER — Other Ambulatory Visit (HOSPITAL_BASED_OUTPATIENT_CLINIC_OR_DEPARTMENT_OTHER): Payer: Medicare Other

## 2016-08-08 VITALS — BP 130/79 | HR 70 | Temp 98.5°F | Resp 17 | Ht 68.0 in | Wt 174.6 lb

## 2016-08-08 DIAGNOSIS — N289 Disorder of kidney and ureter, unspecified: Secondary | ICD-10-CM

## 2016-08-08 DIAGNOSIS — Z23 Encounter for immunization: Secondary | ICD-10-CM | POA: Diagnosis not present

## 2016-08-08 DIAGNOSIS — D509 Iron deficiency anemia, unspecified: Secondary | ICD-10-CM

## 2016-08-08 DIAGNOSIS — C787 Secondary malignant neoplasm of liver and intrahepatic bile duct: Secondary | ICD-10-CM | POA: Diagnosis not present

## 2016-08-08 DIAGNOSIS — C187 Malignant neoplasm of sigmoid colon: Secondary | ICD-10-CM | POA: Diagnosis not present

## 2016-08-08 LAB — CEA (IN HOUSE-CHCC): CEA (CHCC-IN HOUSE): 7.07 ng/mL — AB (ref 0.00–5.00)

## 2016-08-08 MED ORDER — INFLUENZA VAC SPLIT QUAD 0.5 ML IM SUSY
0.5000 mL | PREFILLED_SYRINGE | Freq: Once | INTRAMUSCULAR | Status: AC
  Administered 2016-08-08: 0.5 mL via INTRAMUSCULAR
  Filled 2016-08-08: qty 0.5

## 2016-08-08 NOTE — Telephone Encounter (Signed)
GAVE PATIENT AVS REPORT AND APPOINTMENTS FOR September AND October  °

## 2016-08-08 NOTE — Progress Notes (Signed)
  [place] OFFICE PROGRESS NOTE   Diagnosis: Colon cancer  INTERVAL HISTORY:   Ms. Laske returns as scheduled. She underwent ablation of 2 metastatic liver lesions on 07/25/2016. She reports tolerating the procedure well. She feels well. No complaint. Interventional radiology and urology are deciding on management of the renal mass.  Objective:  Vital signs in last 24 hours:  Blood pressure 130/79, pulse 70, temperature 98.5 F (36.9 C), temperature source Oral, resp. rate 17, height _0  (1.727 m), weight 174 lb 9.6 oz (79.2 kg), SpO2 98 %.    HEENT: Neck without mass Lymphatics: No cervical or supraclavicular nodes Resp: Lungs clear bilaterally Cardio: Regular rate and rhythm GI: No hepatomegaly, nontender, no mass Vascular: No leg edema   Lab Results:  Lab Results  Component Value Date   WBC 6.3 07/26/2016   HGB 12.1 07/26/2016   HCT 36.0 07/26/2016   MCV 84.1 07/26/2016   PLT 184 07/26/2016   NEUTROABS 2.5 05/26/2016   CEA 7.07  Medications: I have reviewed the patient's current medications.  Assessment/Plan: 1. Adenocarcinoma the distal sigmoid colon, poorly differentiated, stage II (T3 N0), status post a laparoscopic assisted sigmoid colectomy 08/09/2015 ? No loss of mismatch repair protein expression ? Elevated preoperative CEA ? Staging CT of the abdomen and pelvis 06/22/2015 with no evidence of distant metastatic disease ? Mildly elevated CEA 02/23/2017And 04/17/2016 ? CTs chest, abdomen, and pelvis on 06/04/2016, 2 mm left upper lobe nodule-no comparison available, new hypodense lesion in the left liver, solid Right renal mass ? MRI 06/10/2016-2 enhancing lesions in the liver consistent with metastatic disease, segment 2 and segment 5. Solid enhancing right renal lesion consistent with a renal cell carcinoma ? Radiofrequency ablation of 2 liver lesions on 07/25/2016   2. Microcytic anemia-likely iron deficiency anemia secondary to  colon cancer,Improved  3. Tubular villous adenoma on the sigmoid colon resection specimen 08/09/2015  Multiple tubular adenomas removed on a colonoscopy 11/16/2015  4.    Right renal mass on CT 06/04/2016-suspicious for renal cell carcinoma    Disposition:  Ms. Szostak appears well. She underwent ablation of 2 metastatic liver lesions on 07/25/2016. She tolerated the procedure well. The CEA is lower today. Hopefully the CEA will normalize over the next few weeks.  She will follow-up with urology and interventional radiology for management of the right renal mass.  I discussed the risk/benefit of adjuvant chemotherapy with Ms. Koehn and her daughter. She did not receive chemotherapy after the colon resection. I recommend adjuvant chemotherapy. We discussed 5-fluorouracil based chemotherapy. We also reviewed the potential benefit with the addition of oxaliplatin. I recommend adjuvant capecitabine. We reviewed the potential toxicities associated with capecitabine including the chance for nausea/vomiting, mucositis, diarrhea, alopecia, and hematologic toxicity. We discussed sun sensitivity, rash, hyperpigmentation, and hand/foot syndrome associated with capecitabine. She agrees to proceed. She will attend a chemotherapy teaching class.  The plan is to begin chemotherapy on 08/18/2016. She will return for an office visit after the first cycle of capecitabine. We will check the CEA when she returns for a chemotherapy teaching class next week.  The plan is to obtain a restaging CT evaluation in 3-4 months.  [name], MD  08/08/2016  10:31 AM

## 2016-08-09 LAB — CEA: CEA: 6.7 ng/mL — ABNORMAL HIGH (ref 0.0–4.7)

## 2016-08-12 ENCOUNTER — Encounter: Payer: Self-pay | Admitting: Pharmacist

## 2016-08-12 NOTE — Progress Notes (Unsigned)
Oral Chemotherapy Pharmacist Encounter   Received prescription for Xeloda. Labs from 9/9 reviewed and appropriate for treatment. Current medication list in Epic assessed, no significant DDIs with Xeloda.  The prescriptions have been sent to the [place] for benefit analysis.   Oral Chemo Clinic will continue to follow  Thank you,  Johny Drilling, PharmD, BCPS 08/12/2016  4:14 PM Oral Chemotherapy Clinic [phone]

## 2016-08-13 ENCOUNTER — Ambulatory Visit: Payer: Medicare Other

## 2016-08-13 ENCOUNTER — Other Ambulatory Visit (HOSPITAL_COMMUNITY): Payer: Medicare Other

## 2016-08-13 ENCOUNTER — Other Ambulatory Visit (HOSPITAL_BASED_OUTPATIENT_CLINIC_OR_DEPARTMENT_OTHER): Payer: Medicare Other

## 2016-08-13 ENCOUNTER — Ambulatory Visit (HOSPITAL_COMMUNITY)
Admission: RE | Admit: 2016-08-13 | Payer: Medicare Other | Source: Ambulatory Visit | Admitting: Interventional Radiology

## 2016-08-13 ENCOUNTER — Encounter: Payer: Self-pay | Admitting: *Deleted

## 2016-08-13 ENCOUNTER — Encounter (HOSPITAL_COMMUNITY): Admission: RE | Payer: Self-pay | Source: Ambulatory Visit

## 2016-08-13 DIAGNOSIS — C787 Secondary malignant neoplasm of liver and intrahepatic bile duct: Secondary | ICD-10-CM

## 2016-08-13 DIAGNOSIS — C187 Malignant neoplasm of sigmoid colon: Secondary | ICD-10-CM

## 2016-08-13 LAB — CBC WITH DIFFERENTIAL/PLATELET
BASO%: 1.1 % (ref 0.0–2.0)
BASOS ABS: 0 10*3/uL (ref 0.0–0.1)
EOS%: 2.7 % (ref 0.0–7.0)
Eosinophils Absolute: 0.1 10*3/uL (ref 0.0–0.5)
HCT: 39.6 % (ref 34.8–46.6)
HGB: 12.9 g/dL (ref 11.6–15.9)
LYMPH%: 21.8 % (ref 14.0–49.7)
MCH: 27.5 pg (ref 25.1–34.0)
MCHC: 32.6 g/dL (ref 31.5–36.0)
MCV: 84.3 fL (ref 79.5–101.0)
MONO#: 0.3 10*3/uL (ref 0.1–0.9)
MONO%: 9 % (ref 0.0–14.0)
NEUT#: 2.4 10*3/uL (ref 1.5–6.5)
NEUT%: 65.4 % (ref 38.4–76.8)
PLATELETS: 233 10*3/uL (ref 145–400)
RBC: 4.7 10*6/uL (ref 3.70–5.45)
RDW: 14.5 % (ref 11.2–14.5)
WBC: 3.7 10*3/uL — ABNORMAL LOW (ref 3.9–10.3)
lymph#: 0.8 10*3/uL — ABNORMAL LOW (ref 0.9–3.3)

## 2016-08-13 LAB — COMPREHENSIVE METABOLIC PANEL
ALBUMIN: 3.4 g/dL — AB (ref 3.5–5.0)
ALK PHOS: 91 U/L (ref 40–150)
ALT: 14 U/L (ref 0–55)
ANION GAP: 11 meq/L (ref 3–11)
AST: 22 U/L (ref 5–34)
BILIRUBIN TOTAL: 0.43 mg/dL (ref 0.20–1.20)
BUN: 12.8 mg/dL (ref 7.0–26.0)
CALCIUM: 9.2 mg/dL (ref 8.4–10.4)
CHLORIDE: 110 meq/L — AB (ref 98–109)
CO2: 20 mEq/L — ABNORMAL LOW (ref 22–29)
CREATININE: 0.9 mg/dL (ref 0.6–1.1)
EGFR: 60 mL/min/{1.73_m2} — ABNORMAL LOW (ref >90)
Glucose: 78 mg/dl (ref 70–140)
Potassium: 4.2 mEq/L (ref 3.5–5.1)
Sodium: 140 mEq/L (ref 136–145)
TOTAL PROTEIN: 7 g/dL (ref 6.4–8.3)

## 2016-08-13 LAB — CEA (IN HOUSE-CHCC): CEA (CHCC-IN HOUSE): 5.43 ng/mL — AB (ref 0.00–5.00)

## 2016-08-13 SURGERY — RADIO FREQUENCY ABLATION
Anesthesia: General | Laterality: Right

## 2016-08-14 ENCOUNTER — Ambulatory Visit
Admission: RE | Admit: 2016-08-14 | Discharge: 2016-08-14 | Disposition: A | Discharge status: Home / Self Care | Payer: Medicare Other | Source: Ambulatory Visit | Attending: Radiology | Admitting: Radiology

## 2016-08-14 ENCOUNTER — Telehealth: Payer: Self-pay | Admitting: *Deleted

## 2016-08-14 DIAGNOSIS — C787 Secondary malignant neoplasm of liver and intrahepatic bile duct: Principal | ICD-10-CM

## 2016-08-14 DIAGNOSIS — C189 Malignant neoplasm of colon, unspecified: Secondary | ICD-10-CM

## 2016-08-14 HISTORY — PX: IR GENERIC HISTORICAL: IMG1180011

## 2016-08-14 LAB — CEA: CEA1: 5.3 ng/mL — AB (ref 0.0–4.7)

## 2016-08-14 MED ORDER — PROCHLORPERAZINE MALEATE 5 MG PO TABS: 5.0000 mg | ORAL_TABLET | Freq: Four times a day (QID) | ORAL | 0 refills | Status: DC | PRN

## 2016-08-14 NOTE — Telephone Encounter (Signed)
Left message for pt to call office.  Received request from chemo education RN for handicap placard. Need to discuss with pt.  Compazine Rx sent to pharmacy for PRN use.

## 2016-08-14 NOTE — Progress Notes (Signed)
Patient ID: Nichole Cross, female   DOB: 05/17/1941, 75 y.o.   MRN: [id number]   Referring Physician(s): Dr. Dominica Severin B. Sherrill  Chief Complaint: The patient is seen in follow up today s/p MWA of liver lesion  History of present illness:  This is a 75 yo female with a history of metastatic colon cancer with mets in her liver as well as RCC.  She was first evaluated by Dr. Annamaria Boots, but due to scheduling Dr. Laurence Ferrari performed her microwave ablation of her liver on 07-25-16.  The patient has done very well from this procedure.  She has had no side effects, such as pain, nausea, vomiting.  She is back to all of her normal activities.  She was actually schedule for a cryoablation of her (R) RCC yesterday, but this was cancelled as she needs to be seen by a urologist first prior to this procedure as it may be technically very challenging.  Otherwise, the patient is doing well with no complaints.  Past Medical History:  Diagnosis Date  . Anemia   . Arthritis    knees  . Blood dyscrasia 2016   microcytic anemia, likely iron deficiency anemia secondary to colon cancer  . Cataracts, bilateral   . Colon cancer ([place]) 06/2015   tubular villous adenoma on sigmoid colon specimen (08/09/15), myltiple tubular adenomas removed on a colonoscopy  11/16/15  . Complication of anesthesia   . Headache(784.0)    migraines  . Hyperthyroidism   . PONV (postoperative nausea and vomiting)    NAUSEA  . Renal carcinoma ([place]) 06/2016   ? primary  . Secondary liver cancer ([place]) 06/2016   ? metastasis from colon    Past Surgical History:  Procedure Laterality Date  . ABDOMINAL HYSTERECTOMY  1993   with bilateral BSO  . abdominal tumor  1993  . LAPAROSCOPIC PARTIAL COLECTOMY N/A 08/09/2015   Procedure: LAPAROSCOPIC PARTIAL COLECTOMY;  Surgeon: Jackolyn Confer, MD;  Location: [facility];  Service: General;  Laterality: N/A;  . PARTIAL KNEE ARTHROPLASTY  09/09/2012   Procedure: UNICOMPARTMENTAL KNEE;  Surgeon:  Mauri Pole, MD;  Location: [facility];  Service: Orthopedics;  Laterality: Left;    Allergies: Aspirin  Medications: Prior to Admission medications   Medication Sig Start Date End Date Taking? Authorizing Provider  aspirin-acetaminophen-caffeine (EXCEDRIN MIGRAINE) [phone] MG tablet Take 1 tablet by mouth every 6 (six) hours as needed for headache.   Yes Historical Provider, MD  ferrous sulfate 325 (65 FE) MG tablet Take 1 tablet (325 mg total) by mouth 3 (three) times daily after meals. Patient taking differently: Take 325 mg by mouth 2 (two) times daily with a meal.  09/10/12  Yes Danae Orleans, PA-C  levothyroxine (SYNTHROID, LEVOTHROID) 137 MCG tablet Take 1 tablet (137 mcg total) by mouth daily before breakfast. 07/16/16  Yes [name], MD  polyethylene glycol (MIRALAX / GLYCOLAX) packet Take 17 g by mouth daily as needed for mild constipation. Reported on 06/05/2016   Yes Historical Provider, MD  traMADol (ULTRAM) 50 MG tablet Take 50 mg by mouth every 6 (six) hours as needed for moderate pain.  08/31/15  Yes Historical Provider, MD     Family History  Problem Relation Age of Onset  . Pancreatic cancer Mother   . Parkinson's disease Mother   . Breast cancer Sister   . Parkinson's disease Brother   . Parkinson's disease Brother   . Colon cancer Neg Hx     Social History   Social History  .  Marital status: Divorced    Spouse name: N/A  . Number of children: N/A  . Years of education: N/A   Social History Main Topics  . Smoking status: Never Smoker  . Smokeless tobacco: Never Used  . Alcohol use No  . Drug use: No  . Sexual activity: Not on file   Other Topics Concern  . 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  Labs:  CBC:  Recent Labs  05/26/16 0944 07/09/16 1140 07/26/16 0419 08/13/16 0901  WBC 4.0 3.8* 6.3 3.7*  HGB 14.4 13.8 12.1 12.9  HCT 43.1 41.8 36.0 39.6  PLT 190 227 184 233    COAGS:  Recent Labs  07/09/16 1140  INR 1.08  APTT 30    BMP:  Recent Labs  07/09/16 1140 07/25/16 1020 07/26/16 0419 08/13/16 0901  NA 139 140 135 140  K 5.6* 3.8 4.9 4.2  CL 108 109 104  --   CO2 25 24 25  20*  GLUCOSE 109* 104* 131* 78  BUN 16 13 13  12.8  CALCIUM 9.7 9.4 8.9 9.2  CREATININE 1.07* 0.79 0.91 0.9  GFRNONAA 50* >60 >60  --   GFRAA 58* >60 >60  --     LIVER FUNCTION TESTS:  Recent Labs  07/09/16 1140 07/25/16 1020 07/26/16 0419 08/13/16 0901  BILITOT 0.5 0.5 0.6 0.43  AST 29 27 98* 22  ALT 16 14 44 14  ALKPHOS 84 78 65 91  PROT 7.8 7.8 6.6 7.0  ALBUMIN 4.6 4.4 3.8 3.4*    Assessment:  1. S/p CT guided microwave ablation of two separate liver metastases on 07-25-16  The patient is doing well.  She is going to start chemotherapy on October 2.  We will plan to obtain a MRI of her abd/pel with contrast 3 months status post her procedure to follow these areas and to assess for how well they have responded to treatment.  She will follow up with Dr. Laurence Ferrari in clinic after this imaging.  2. (R) Renal Cell Carcinoma This area in her right kidney has actually been quite stable for the last year with essentially no changes in its measurements.  Due to its location, we have recommended prior to proceeding with a cryoablation that she follow up with urology to further assess this area for possible  surgical resection or watchful waiting.  She is to see Dr. Alyson Ingles with urology on 09-11-16.   Signed: Henreitta Cea 08/14/2016, 11:08 AM   Please refer to Dr. Katrinka Blazing attestation of this note for management and plan.

## 2016-08-14 NOTE — Telephone Encounter (Signed)
-----   Message from Ladell Pier, MD sent at 08/13/2016  4:25 PM EDT ----- Please call patien, cea is better, proceed with xeloda

## 2016-08-15 MED FILL — CAPECITABINE 500 MG TABLET: 500 | 14 days supply | Qty: 84 | Fill #0

## 2016-08-15 NOTE — Telephone Encounter (Signed)
Pt returned call, she reports difficulty ambulating due to knee pain, hx of knee surgery. Recommended she contact orthopedic MD for handicap placard. She agreed to do so. Compazine instructions reviewed, she voiced understanding.   Informed pt of Xeloda co-pay per [facility]- she will discuss with her children and contact the pharmacy.

## 2016-08-18 ENCOUNTER — Other Ambulatory Visit: Payer: Self-pay | Admitting: *Deleted

## 2016-08-19 ENCOUNTER — Encounter: Payer: Self-pay | Admitting: *Deleted

## 2016-08-27 ENCOUNTER — Encounter: Payer: Self-pay | Admitting: Internal Medicine

## 2016-08-27 ENCOUNTER — Telehealth: Payer: Self-pay | Admitting: *Deleted

## 2016-08-27 NOTE — Telephone Encounter (Signed)
Returned call to pt: she denies any more bleeding. Per Dr. Benay Spice: Bleeding should not be related to Xeloda. See GI for recurrent bleeding. Call for diarrhea, persistent bleeding. Pt voiced understanding.

## 2016-08-27 NOTE — Telephone Encounter (Signed)
Oncology Nurse Navigator Documentation  Oncology Nurse Navigator Flowsheets 08/27/2016  Navigator Location [facility]  Navigator Encounter Type Telephone  Telephone Incoming Call;Outgoing Call;Symptom Mgt--rectal bleeding X 1  Patient Visit Type -  Treatment Phase -  Barriers/Navigation Needs -  Education -  Interventions Other--notified MD  Coordination of Care -  Education Method -  Support Groups/Services -  Acuity Level 1  Time Spent with Patient 15  Reports last night having blood on her pad and in toilet when she had BM-was bright red w/some clots. No further bleeding. No external hemoroids as far as she can tell and no diarrhea or s/s of mucositis. Started her Xeloda on 08/18/16. Next appointment 09/04/16.

## 2016-08-28 ENCOUNTER — Other Ambulatory Visit: Payer: Medicare Other

## 2016-08-28 ENCOUNTER — Ambulatory Visit: Payer: Medicare Other | Admitting: Oncology

## 2016-09-02 ENCOUNTER — Other Ambulatory Visit: Payer: Self-pay | Admitting: Oncology

## 2016-09-03 ENCOUNTER — Encounter: Payer: Self-pay | Admitting: *Deleted

## 2016-09-03 MED ORDER — CAPECITABINE 500 MG PO TABS: 1500.0000 mg | ORAL_TABLET | Freq: Two times a day (BID) | ORAL | 0 refills | Status: DC

## 2016-09-04 ENCOUNTER — Telehealth: Payer: Self-pay | Admitting: *Deleted

## 2016-09-04 ENCOUNTER — Ambulatory Visit (HOSPITAL_BASED_OUTPATIENT_CLINIC_OR_DEPARTMENT_OTHER): Payer: Medicare Other | Admitting: Oncology

## 2016-09-04 ENCOUNTER — Other Ambulatory Visit (HOSPITAL_BASED_OUTPATIENT_CLINIC_OR_DEPARTMENT_OTHER): Payer: Medicare Other

## 2016-09-04 ENCOUNTER — Other Ambulatory Visit: Payer: Self-pay | Admitting: *Deleted

## 2016-09-04 ENCOUNTER — Telehealth: Payer: Self-pay | Admitting: Oncology

## 2016-09-04 VITALS — BP 141/65 | HR 61 | Temp 97.4°F | Resp 18 | Ht 67.5 in | Wt 176.1 lb

## 2016-09-04 DIAGNOSIS — D509 Iron deficiency anemia, unspecified: Secondary | ICD-10-CM

## 2016-09-04 DIAGNOSIS — C187 Malignant neoplasm of sigmoid colon: Secondary | ICD-10-CM

## 2016-09-04 DIAGNOSIS — R93421 Abnormal radiologic findings on diagnostic imaging of right kidney: Secondary | ICD-10-CM

## 2016-09-04 LAB — CBC WITH DIFFERENTIAL/PLATELET
BASO%: 0.3 % (ref 0.0–2.0)
BASOS ABS: 0 10*3/uL (ref 0.0–0.1)
EOS ABS: 0.2 10*3/uL (ref 0.0–0.5)
EOS%: 4.5 % (ref 0.0–7.0)
HEMATOCRIT: 40.9 % (ref 34.8–46.6)
HGB: 13.9 g/dL (ref 11.6–15.9)
LYMPH#: 1 10*3/uL (ref 0.9–3.3)
LYMPH%: 26.9 % (ref 14.0–49.7)
MCH: 28.4 pg (ref 25.1–34.0)
MCHC: 34 g/dL (ref 31.5–36.0)
MCV: 83.5 fL (ref 79.5–101.0)
MONO#: 0.3 10*3/uL (ref 0.1–0.9)
MONO%: 7.8 % (ref 0.0–14.0)
NEUT#: 2.2 10*3/uL (ref 1.5–6.5)
NEUT%: 60.5 % (ref 38.4–76.8)
PLATELETS: 208 10*3/uL (ref 145–400)
RBC: 4.9 10*6/uL (ref 3.70–5.45)
RDW: 14.5 % (ref 11.2–14.5)
WBC: 3.6 10*3/uL — ABNORMAL LOW (ref 3.9–10.3)

## 2016-09-04 LAB — CEA (IN HOUSE-CHCC): CEA (CHCC-In House): 5.79 ng/mL — ABNORMAL HIGH (ref 0.00–5.00)

## 2016-09-04 MED ORDER — CAPECITABINE 500 MG PO TABS: 1500.0000 mg | ORAL_TABLET | Freq: Two times a day (BID) | ORAL | 0 refills | Status: DC

## 2016-09-04 MED FILL — CAPECITABINE 500 MG TABLET: 500 | 14 days supply | Qty: 84 | Fill #0

## 2016-09-04 NOTE — Telephone Encounter (Signed)
-----   Message from Ladell Pier, MD sent at 09/04/2016  5:06 PM EDT ----- Please call patient, cea still mildly elevated-stable, she may have chronic mild elevation of the cea, will continue to follow

## 2016-09-04 NOTE — Progress Notes (Signed)
  [place] OFFICE PROGRESS NOTE   Diagnosis: Colon cancer  INTERVAL HISTORY:   Nichole Cross returns as scheduled. She completed a first cycle of Xeloda beginning 08/18/2016. No mouth sores, hand/foot pain, or diarrhea. She had an episode of nausea, relieved with Compazine. She reports pain in the right knee related to arthritis. She needs a "knee replacement ". She would like a handicap sticker. She had one episode of rectal bleeding with clots. No other bleeding. She currently has a cold characterized by rhinorrhea. No fever. Objective:  Vital signs in last 24 hours:  Blood pressure (!) 141/65, pulse 61, temperature 97.4 F (36.3 C), temperature source Oral, resp. rate 18, height 5' 7.5" (1.715 m), weight 176 lb 1.6 oz (79.9 kg), SpO2 100 %.    HEENT: No thrush or ulcers Resp: Lungs clear bilaterally Cardio: Regular rate and rhythm GI: No hepatomegaly, nontender Vascular: No leg edema  Skin: Palms without erythema     Lab Results:  Lab Results  Component Value Date   WBC 3.6 (L) 09/04/2016   HGB 13.9 09/04/2016   HCT 40.9 09/04/2016   MCV 83.5 09/04/2016   PLT 208 09/04/2016   NEUTROABS 2.2 09/04/2016   CEA on 08/13/2016: 5.43  Medications: I have reviewed the patient's current medications.  Assessment/Plan: 1. Adenocarcinoma the distal sigmoid colon, poorly differentiated, stage II (T3 N0), status post a laparoscopic assisted sigmoid colectomy 08/09/2015 ? No loss of mismatch repair protein expression ? Elevated preoperative CEA ? Staging CT of the abdomen and pelvis 06/22/2015 with no evidence of distant metastatic disease ? Mildly elevated CEA 02/23/2017And 04/17/2016 ? CTs chest, abdomen, and pelvis on 06/04/2016, 2 mm left upper lobe nodule-no comparison available, new hypodense lesion in the left liver, solid Right renal mass ? MRI 06/10/2016-2 enhancing lesions in the liver consistent with metastatic disease, segment 2 and segment 5. Solid  enhancing right renal lesion consistent with a renal cell carcinoma ? Radiofrequency ablation of 2 liver lesions on 07/25/2016 ? Cycle 1 adjuvant Xeloda 08/18/2016 ? Cycle 2 Xeloda 09/08/2016   2. Microcytic anemia-likely iron deficiency anemia secondary to colon cancer,Improved  3. Tubular villous adenoma on the sigmoid colon resection specimen 08/09/2015  Multiple tubular adenomas removed on a colonoscopy 11/16/2015  4.    Right renal mass on CT 06/04/2016-suspicious for renal cell carcinoma     Disposition:  She tolerated the first cycle of Xeloda well. The plan is to proceed with cycle 2 on 09/08/2016. She had an episode of rectal bleeding last week. She will schedule a colonoscopy with Dr.Pyrtle. Nichole Cross will return for an office visit In 3 weeks. We will follow-up on the CEA from today.   [name], MD  09/04/2016  10:06 AM

## 2016-09-04 NOTE — Telephone Encounter (Signed)
Per Dr. Benay Spice, patient notified that cea is still mildly elevated, but stable and that she may have chronic mild elevation of the cea and we will continue to follow.  Patient appreciative of call and has no questions or concerns at this time.

## 2016-09-04 NOTE — Telephone Encounter (Signed)
Gave patient avs report and appointments for November.  °

## 2016-09-05 LAB — CEA: CEA1: 5.7 ng/mL — AB (ref 0.0–4.7)

## 2016-09-09 ENCOUNTER — Telehealth: Payer: Self-pay | Admitting: Pharmacist

## 2016-09-09 NOTE — Telephone Encounter (Signed)
Oral Chemotherapy Pharmacist Encounter  I was able to successfully enroll patient for copay assistance with [facility]) for Xeloda help. I called [facility] and gave billing information for the patient's file. I called patient and LVM to alert her of good news.  Award amount: $2800 Term dates: 06/11/16-09/08/17 Billing ID: [id number] Person code: 01 RxBIN: [id number] PCN: [id number] Group: [id number]  Oral Chemo Clinic will continue to follow.  Johny Drilling, PharmD, BCPS 09/09/2016  2:42 PM Oral Chemotherapy Clinic [phone]

## 2016-09-11 ENCOUNTER — Encounter: Payer: Self-pay | Admitting: Internal Medicine

## 2016-09-26 ENCOUNTER — Ambulatory Visit (HOSPITAL_BASED_OUTPATIENT_CLINIC_OR_DEPARTMENT_OTHER): Payer: Medicare Other | Admitting: Nurse Practitioner

## 2016-09-26 ENCOUNTER — Telehealth: Payer: Self-pay | Admitting: Nurse Practitioner

## 2016-09-26 ENCOUNTER — Other Ambulatory Visit (HOSPITAL_BASED_OUTPATIENT_CLINIC_OR_DEPARTMENT_OTHER): Payer: Medicare Other

## 2016-09-26 VITALS — BP 128/63 | HR 78 | Temp 98.3°F | Resp 18 | Wt 173.5 lb

## 2016-09-26 DIAGNOSIS — D509 Iron deficiency anemia, unspecified: Secondary | ICD-10-CM

## 2016-09-26 DIAGNOSIS — R197 Diarrhea, unspecified: Secondary | ICD-10-CM

## 2016-09-26 DIAGNOSIS — R109 Unspecified abdominal pain: Secondary | ICD-10-CM

## 2016-09-26 DIAGNOSIS — C787 Secondary malignant neoplasm of liver and intrahepatic bile duct: Secondary | ICD-10-CM

## 2016-09-26 DIAGNOSIS — C187 Malignant neoplasm of sigmoid colon: Secondary | ICD-10-CM

## 2016-09-26 DIAGNOSIS — C189 Malignant neoplasm of colon, unspecified: Secondary | ICD-10-CM

## 2016-09-26 LAB — CBC WITH DIFFERENTIAL/PLATELET
BASO%: 0.6 % (ref 0.0–2.0)
BASOS ABS: 0 10*3/uL (ref 0.0–0.1)
EOS ABS: 0.1 10*3/uL (ref 0.0–0.5)
EOS%: 1.1 % (ref 0.0–7.0)
HCT: 40.4 % (ref 34.8–46.6)
HGB: 13.4 g/dL (ref 11.6–15.9)
LYMPH%: 7.4 % — AB (ref 14.0–49.7)
MCH: 28.3 pg (ref 25.1–34.0)
MCHC: 33 g/dL (ref 31.5–36.0)
MCV: 85.6 fL (ref 79.5–101.0)
MONO#: 0.8 10*3/uL (ref 0.1–0.9)
MONO%: 9.5 % (ref 0.0–14.0)
NEUT%: 81.4 % — AB (ref 38.4–76.8)
NEUTROS ABS: 6.5 10*3/uL (ref 1.5–6.5)
PLATELETS: 220 10*3/uL (ref 145–400)
RBC: 4.72 10*6/uL (ref 3.70–5.45)
RDW: 15.1 % — ABNORMAL HIGH (ref 11.2–14.5)
WBC: 8 10*3/uL (ref 3.9–10.3)
lymph#: 0.6 10*3/uL — ABNORMAL LOW (ref 0.9–3.3)

## 2016-09-26 LAB — COMPREHENSIVE METABOLIC PANEL
ALK PHOS: 87 U/L (ref 40–150)
ALT: 49 U/L (ref 0–55)
ANION GAP: 9 meq/L (ref 3–11)
AST: 54 U/L — ABNORMAL HIGH (ref 5–34)
Albumin: 3.6 g/dL (ref 3.5–5.0)
BILIRUBIN TOTAL: 1.44 mg/dL — AB (ref 0.20–1.20)
BUN: 13.9 mg/dL (ref 7.0–26.0)
CO2: 23 meq/L (ref 22–29)
Calcium: 9 mg/dL (ref 8.4–10.4)
Chloride: 102 mEq/L (ref 98–109)
Creatinine: 1 mg/dL (ref 0.6–1.1)
EGFR: 59 mL/min/{1.73_m2} — AB (ref >90)
GLUCOSE: 126 mg/dL (ref 70–140)
POTASSIUM: 3.7 meq/L (ref 3.5–5.1)
SODIUM: 134 meq/L — AB (ref 136–145)
TOTAL PROTEIN: 6.5 g/dL (ref 6.4–8.3)

## 2016-09-26 LAB — CEA (IN HOUSE-CHCC): CEA (CHCC-In House): 3.18 ng/mL (ref 0.00–5.00)

## 2016-09-26 MED ORDER — HYDROCODONE-ACETAMINOPHEN 5-325 MG PO TABS: 1.0000 | ORAL_TABLET | Freq: Four times a day (QID) | ORAL | 0 refills | Status: DC | PRN

## 2016-09-26 NOTE — Progress Notes (Addendum)
[place] OFFICE PROGRESS NOTE   Diagnosis:  Colon cancer  INTERVAL HISTORY:   Nichole Cross returns as scheduled. She completed cycle 2 Xeloda beginning 09/08/2016. She has periodic nausea. No vomiting. No mouth sores. She had loose stools 2 days over the past week. She took Imodium with good relief. Last loose stool was on Wednesday. No hand or foot pain or redness. Beginning 2 days ago she has been experiencing a "twisting" sharp low abdominal pain multiple times a day. No fever.  Objective:  Vital signs in last 24 hours:  Blood pressure 128/63, pulse 78, temperature 98.3 F (36.8 C), temperature source Oral, resp. rate 18, weight 173 lb 8 oz (78.7 kg), SpO2 100 %.    HEENT: No thrush or ulcers.  Resp: Lungs clear bilaterally. Cardio: Regular rate and rhythm. GI: Abdomen is soft. Tender low mid abdomen mainly around the healed surgical incision. Bowel sounds are active. No hepatomegaly. Vascular: No leg edema. Skin: Palms without erythema.    Lab Results:  Lab Results  Component Value Date   WBC 8.0 09/26/2016   HGB 13.4 09/26/2016   HCT 40.4 09/26/2016   MCV 85.6 09/26/2016   PLT 220 09/26/2016   NEUTROABS 6.5 09/26/2016    Imaging:  No results found.  Medications: I have reviewed the patient's current medications.  Assessment/Plan: 1. Adenocarcinoma the distal sigmoid colon, poorly differentiated, stage II (T3 N0), status post a laparoscopic assisted sigmoid colectomy 08/09/2015 ? No loss of mismatch repair protein expression ? Elevated preoperative CEA ? Staging CT of the abdomen and pelvis 06/22/2015 with no evidence of distant metastatic disease ? Mildly elevated CEA 02/23/2017And 04/17/2016 ? CTs chest, abdomen, and pelvis on 06/04/2016, 20m left upper lobe nodule-no comparison available, new hypodense lesion in the left liver, solid Right renal mass ? MRI 06/10/2016-2 enhancing lesions in the liver consistent with metastatic disease,  segment 2 and segment 5. Solid enhancing right renal lesion consistent with a renal cell carcinoma ? Radiofrequency ablation of 2 liver lesions on 07/25/2016 ? Cycle 1 adjuvant Xeloda 08/18/2016 ? Cycle 2 Xeloda 09/08/2016 ? CEA improved 09/26/2016   2. Microcytic anemia-likely iron deficiency anemia secondary to colon cancer,Improved  3. Tubular villous adenoma on the sigmoid colon resection specimen 08/09/2015  Multiple tubular adenomas removed on a colonoscopy 11/16/2015  4. Right renal mass on CT 06/04/2016-suspicious for renal cell carcinoma   Disposition: Nichole Cross. She has completed 2 cycles of Xeloda. She is due to begin cycle 3 09/29/2016.  At today's visit she reports a several day history of lower abdominal pain and diarrhea. We discussed the possibility of chemotherapy-induced enteritis. We will see her back on 09/29/2016 for reevaluation prior to beginning the next cycle of Xeloda. She understands to contact the office or seek evaluation in the emergency department with worsening of current symptoms, nausea/vomiting, fever. She and her daughter expressed their understanding. She was provided with a prescription for Norco 5/325 one to 2 tablets every 6 hours as needed for the abdominal pain. She understands she should not be driving while taking Norco.  Patient seen with Dr. SBenay Spice 25 minutes were spent face-to-face at today's visit with the majority of that time involved in counseling/coordination of care.    TNed CardANP/GNP-BC   09/26/2016  12:28 PM  This was a shared visit with LNed Card Nichole Cross. The abdominal pain may be related to Xeloda-induced enteritis. We have a low clinical suspicion for progression of  the colon cancer. She will contact us for increased pain or new symptoms. She will return for an office visit prior to beginning the next cycle of Xeloda.  Julieanne Manson, M.D.

## 2016-09-26 NOTE — Telephone Encounter (Signed)
Appointments scheduled per 09/26/16 los. AVS report and appointment schedule was given to patient, per 09/26/16 los. ° °

## 2016-09-29 ENCOUNTER — Ambulatory Visit (HOSPITAL_BASED_OUTPATIENT_CLINIC_OR_DEPARTMENT_OTHER): Payer: Medicare Other | Admitting: Nurse Practitioner

## 2016-09-29 ENCOUNTER — Other Ambulatory Visit (HOSPITAL_BASED_OUTPATIENT_CLINIC_OR_DEPARTMENT_OTHER): Payer: Medicare Other

## 2016-09-29 VITALS — BP 126/69 | HR 78 | Temp 97.7°F | Resp 17 | Ht 67.5 in | Wt 170.6 lb

## 2016-09-29 DIAGNOSIS — C787 Secondary malignant neoplasm of liver and intrahepatic bile duct: Secondary | ICD-10-CM

## 2016-09-29 DIAGNOSIS — D509 Iron deficiency anemia, unspecified: Secondary | ICD-10-CM

## 2016-09-29 DIAGNOSIS — R197 Diarrhea, unspecified: Secondary | ICD-10-CM

## 2016-09-29 DIAGNOSIS — C187 Malignant neoplasm of sigmoid colon: Secondary | ICD-10-CM

## 2016-09-29 DIAGNOSIS — R109 Unspecified abdominal pain: Secondary | ICD-10-CM

## 2016-09-29 DIAGNOSIS — C189 Malignant neoplasm of colon, unspecified: Secondary | ICD-10-CM

## 2016-09-29 LAB — COMPREHENSIVE METABOLIC PANEL
ALT: 28 U/L (ref 0–55)
AST: 23 U/L (ref 5–34)
Albumin: 3.4 g/dL — ABNORMAL LOW (ref 3.5–5.0)
Alkaline Phosphatase: 96 U/L (ref 40–150)
Anion Gap: 11 mEq/L (ref 3–11)
BILIRUBIN TOTAL: 1.52 mg/dL — AB (ref 0.20–1.20)
BUN: 11.8 mg/dL (ref 7.0–26.0)
CALCIUM: 9.4 mg/dL (ref 8.4–10.4)
CHLORIDE: 100 meq/L (ref 98–109)
CO2: 24 meq/L (ref 22–29)
CREATININE: 0.9 mg/dL (ref 0.6–1.1)
EGFR: 67 mL/min/{1.73_m2} — ABNORMAL LOW (ref >90)
Glucose: 107 mg/dl (ref 70–140)
Potassium: 3.5 mEq/L (ref 3.5–5.1)
Sodium: 135 mEq/L — ABNORMAL LOW (ref 136–145)
TOTAL PROTEIN: 6.9 g/dL (ref 6.4–8.3)

## 2016-09-29 NOTE — Progress Notes (Addendum)
[facility] OFF[facility] PROGRESS NOTE   Diagnosis:  Colon cancer  INTERVAL HISTORY:   [name] returns as scheduled. She completed cycle 2 Xeloda beginning 09/08/2016. At the time of her last visit on 09/26/2016 she reported a several day history of lower abdominal pain and diarrhea. We discussed the possibility of chemotherapy-induced enteritis. She is seen today for follow-up.  She continues to have abdominal pain. She is taking Norco every 6 hours with partial relief. She has loose stools after eating. She estimates 3-4 loose stools a day. No bloody bowel movements. No fever. Appetite is poor. No nausea.  Objective:  Vital signs in last 24 hours:  Blood pressure 126/69, pulse 78, temperature 97.7 F (36.5 C), temperature source Oral, resp. rate 17, height 5' 7.5" (1.715 m), weight 170 lb 9.6 oz (77.4 kg), SpO2 100 %.    HEENT: No thrush or ulcers. Mucous membranes appear moist. Resp: Lungs clear bilaterally. Cardio: Regular rate and rhythm. GI: Abdomen is soft. No hepatomegaly. No mass. Tender right lower abdomen. Vascular: No leg edema.  Lab Results:  Lab Results  Component Value Date   WBC 8.0 09/26/2016   HGB 13.4 09/26/2016   HCT 40.4 09/26/2016   MCV 85.6 09/26/2016   PLT 220 09/26/2016   NEUTROABS 6.5 09/26/2016    Imaging:  No results found.  Medications: I have reviewed the patient's current medications.  Assessment/Plan: 1. Adenocarcinoma the distal sigmoid colon, poorly differentiated, stage II (T3 N0), status post a laparoscopic assisted sigmoid colectomy 08/09/2015 ? No loss of mismatch repair protein expression ? Elevated preoperative CEA ? Staging CT of the abdomen and pelvis 06/22/2015 with no evidence of distant metastatic disease ? Mildly elevated CEA 02/23/2017And 04/17/2016 ? CTs chest, abdomen, and pelvis on 06/04/2016, 22m left upper lobe nodule-no comparison available, new hypodense lesion in the left liver, solid Right renal  mass ? MRI 06/10/2016-2 enhancing lesions in the liver consistent with metastatic disease, segment 2 and segment 5. Solid enhancing right renal lesion consistent with a renal cell carcinoma ? Radiofrequency ablation of 2 liver lesions on 07/25/2016 ? Cycle 1 adjuvant Xeloda 08/18/2016 ? Cycle 2 Xeloda 09/08/2016 ? CEA improved 09/26/2016 ? Cycle 3 Xeloda held 09/29/2016 due to unexplained abdominal pain, referred for CT   2. Microcytic anemia-likely iron deficiency anemia secondary to colon cancer,Improved  3. Tubular villous adenoma on the sigmoid colon resection specimen 08/09/2015  Multiple tubular adenomas removed on a colonoscopy 11/16/2015  4. Right renal mass on CT 06/04/2016-suspicious for renal cell carcinoma   Disposition: Ms. BCorrowcontinues to have abdominal pain. There is no clear explanation for the pain. She is taking pain medication every 6 hours. She continues to have loose stools.  We discussed the possibility of chemotherapy-induced enteritis. We also discussed the possibility of an infection/abscess. She will have CT scans of the abdomen/pelvis tomorrow. We will contact her with the results and schedule follow-up as appropriate. She and her daughter understand to contact the office if current symptoms worsen or she develops any new symptoms such as fever, nausea/vomiting.  Patient seen with Dr. SBenay Spice 25 minutes were spent face-to-face at today's visit with the majority of that time involved in counseling/coordination of care.    TNed CardANP/GNP-BC   09/29/2016  2:57 PM  This was a shared visit with LNed Card Ms. BEislerwas interviewed and examined. She has persistent abdominal pain requiring narcotic analgesics. We decided to proceed with a CT of the abdomen/pelvis.  BJulieanne Manson M.D.

## 2016-09-30 ENCOUNTER — Encounter (HOSPITAL_COMMUNITY): Payer: Self-pay

## 2016-09-30 ENCOUNTER — Ambulatory Visit (HOSPITAL_COMMUNITY)
Admission: RE | Admit: 2016-09-30 | Discharge: 2016-09-30 | Disposition: A | Discharge status: Home / Self Care | Payer: Medicare Other | Source: Ambulatory Visit | Attending: Nurse Practitioner | Admitting: Nurse Practitioner

## 2016-09-30 DIAGNOSIS — C787 Secondary malignant neoplasm of liver and intrahepatic bile duct: Secondary | ICD-10-CM | POA: Diagnosis present

## 2016-09-30 DIAGNOSIS — C187 Malignant neoplasm of sigmoid colon: Secondary | ICD-10-CM | POA: Diagnosis not present

## 2016-09-30 DIAGNOSIS — K639 Disease of intestine, unspecified: Secondary | ICD-10-CM | POA: Diagnosis not present

## 2016-09-30 DIAGNOSIS — C189 Malignant neoplasm of colon, unspecified: Secondary | ICD-10-CM

## 2016-09-30 DIAGNOSIS — N2889 Other specified disorders of kidney and ureter: Secondary | ICD-10-CM | POA: Diagnosis not present

## 2016-09-30 MED ORDER — IOPAMIDOL (ISOVUE-300) INJECTION 61%: 75.0000 mL | Freq: Once | INTRAVENOUS | Status: DC | PRN

## 2016-09-30 MED ORDER — IOPAMIDOL (ISOVUE-300) INJECTION 61%
100.0000 mL | Freq: Once | INTRAVENOUS | Status: AC | PRN
  Administered 2016-09-30: 100 mL via INTRAVENOUS

## 2016-10-01 ENCOUNTER — Telehealth: Payer: Self-pay | Admitting: *Deleted

## 2016-10-01 NOTE — Telephone Encounter (Addendum)
Call from pt requesting CT results. Provider to call pt with result.

## 2016-10-02 ENCOUNTER — Other Ambulatory Visit: Payer: Self-pay | Admitting: Nurse Practitioner

## 2016-10-02 ENCOUNTER — Telehealth: Payer: Self-pay | Admitting: Nurse Practitioner

## 2016-10-02 DIAGNOSIS — C189 Malignant neoplasm of colon, unspecified: Secondary | ICD-10-CM

## 2016-10-02 DIAGNOSIS — C787 Secondary malignant neoplasm of liver and intrahepatic bile duct: Principal | ICD-10-CM

## 2016-10-02 DIAGNOSIS — C187 Malignant neoplasm of sigmoid colon: Secondary | ICD-10-CM

## 2016-10-02 MED ORDER — HYDROCODONE-ACETAMINOPHEN 5-325 MG PO TABS: 1.0000 | ORAL_TABLET | Freq: Three times a day (TID) | ORAL | 0 refills | Status: DC | PRN

## 2016-10-02 NOTE — Telephone Encounter (Signed)
I contacted Nichole Cross regarding the recent CT scan which showed evidence of enteritis likely related to Xeloda. She reports abdominal pain is better. She continues to have loose stools, especially after eating. She takes hydrocodone as needed, typically 1 tablet once or twice a day. She needs a refill on hydrocodone and will pickup the prescription later today. I instructed her to continue to hold Xeloda. She will return for a follow-up visit on 10/07/2016.

## 2016-10-07 ENCOUNTER — Ambulatory Visit (HOSPITAL_BASED_OUTPATIENT_CLINIC_OR_DEPARTMENT_OTHER): Payer: Medicare Other | Admitting: Nurse Practitioner

## 2016-10-07 ENCOUNTER — Telehealth: Payer: Self-pay | Admitting: Oncology

## 2016-10-07 ENCOUNTER — Other Ambulatory Visit: Payer: Self-pay

## 2016-10-07 ENCOUNTER — Ambulatory Visit (HOSPITAL_BASED_OUTPATIENT_CLINIC_OR_DEPARTMENT_OTHER): Payer: Medicare Other

## 2016-10-07 VITALS — BP 137/68 | HR 70 | Temp 98.1°F | Resp 18 | Ht 67.5 in | Wt 174.9 lb

## 2016-10-07 DIAGNOSIS — C787 Secondary malignant neoplasm of liver and intrahepatic bile duct: Principal | ICD-10-CM

## 2016-10-07 DIAGNOSIS — C189 Malignant neoplasm of colon, unspecified: Secondary | ICD-10-CM

## 2016-10-07 DIAGNOSIS — C187 Malignant neoplasm of sigmoid colon: Secondary | ICD-10-CM

## 2016-10-07 DIAGNOSIS — D509 Iron deficiency anemia, unspecified: Secondary | ICD-10-CM | POA: Diagnosis not present

## 2016-10-07 LAB — COMPREHENSIVE METABOLIC PANEL
ALT: 17 U/L (ref 0–55)
ANION GAP: 9 meq/L (ref 3–11)
AST: 27 U/L (ref 5–34)
Albumin: 3.4 g/dL — ABNORMAL LOW (ref 3.5–5.0)
Alkaline Phosphatase: 109 U/L (ref 40–150)
BUN: 14.8 mg/dL (ref 7.0–26.0)
CHLORIDE: 105 meq/L (ref 98–109)
CO2: 25 meq/L (ref 22–29)
Calcium: 9.2 mg/dL (ref 8.4–10.4)
Creatinine: 1.1 mg/dL (ref 0.6–1.1)
EGFR: 49 mL/min/{1.73_m2} — AB (ref >90)
GLUCOSE: 93 mg/dL (ref 70–140)
Potassium: 3.5 mEq/L (ref 3.5–5.1)
SODIUM: 138 meq/L (ref 136–145)
Total Bilirubin: 0.74 mg/dL (ref 0.20–1.20)
Total Protein: 6.4 g/dL (ref 6.4–8.3)

## 2016-10-07 LAB — CBC WITH DIFFERENTIAL/PLATELET
BASO%: 1.4 % (ref 0.0–2.0)
Basophils Absolute: 0.1 10*3/uL (ref 0.0–0.1)
EOS%: 2 % (ref 0.0–7.0)
Eosinophils Absolute: 0.1 10*3/uL (ref 0.0–0.5)
HCT: 41 % (ref 34.8–46.6)
HGB: 13.5 g/dL (ref 11.6–15.9)
LYMPH%: 18.3 % (ref 14.0–49.7)
MCH: 28 pg (ref 25.1–34.0)
MCHC: 32.9 g/dL (ref 31.5–36.0)
MCV: 85.3 fL (ref 79.5–101.0)
MONO#: 0.5 10*3/uL (ref 0.1–0.9)
MONO%: 7.6 % (ref 0.0–14.0)
NEUT%: 70.7 % (ref 38.4–76.8)
NEUTROS ABS: 4.9 10*3/uL (ref 1.5–6.5)
PLATELETS: 263 10*3/uL (ref 145–400)
RBC: 4.81 10*6/uL (ref 3.70–5.45)
RDW: 19.7 % — ABNORMAL HIGH (ref 11.2–14.5)
WBC: 7 10*3/uL (ref 3.9–10.3)
lymph#: 1.3 10*3/uL (ref 0.9–3.3)

## 2016-10-07 LAB — TECHNOLOGIST REVIEW

## 2016-10-07 MED ORDER — CAPECITABINE 500 MG PO TABS: 1000.0000 mg | ORAL_TABLET | Freq: Two times a day (BID) | ORAL | 0 refills | Status: DC

## 2016-10-07 MED FILL — CAPECITABINE 500 MG TABLET: 500 | 14 days supply | Qty: 56 | Fill #0

## 2016-10-07 NOTE — Telephone Encounter (Signed)
Appointments scheduled per 11/21 LOS. Patient given AVS report and calendars with future scheduled appointments. °

## 2016-10-07 NOTE — Progress Notes (Addendum)
[place] OFFICE PROGRESS NOTE   Diagnosis:  Colon cancer  INTERVAL HISTORY:   Ms. Osborne returns as scheduled. She completed cycle 2 Xeloda beginning 09/08/2016. Cycle 3 has been on hold due to abdominal pain and diarrhea. CT scan 09/30/2016 showed a long segment of bowel wall thickening involving the distal ileum with differential diagnosis including inflammatory bowel disease, infectious enteritis or drug induced enteritis.  She is feeling much better. The abdominal pain has lessened significantly. No diarrhea. No mouth sores. No hand or foot pain or redness.  Objective:  Vital signs in last 24 hours:  Blood pressure 137/68, pulse 70, temperature 98.1 F (36.7 C), temperature source Oral, resp. rate 18, height 5' 7.5" (1.715 m), weight 174 lb 14.4 oz (79.3 kg), SpO2 100 %.    HEENT: No thrush or ulcers. Resp: Lungs clear bilaterally. Cardio: Regular rate and rhythm. GI: Abdomen soft and nontender. No hepatomegaly. Bowel sounds active. Vascular: No leg edema. Skin: Base of thumb on palm side with dryness bilaterally.    Lab Results:  Lab Results  Component Value Date   WBC 8.0 09/26/2016   HGB 13.4 09/26/2016   HCT 40.4 09/26/2016   MCV 85.6 09/26/2016   PLT 220 09/26/2016   NEUTROABS 6.5 09/26/2016    Imaging:  No results found.  Medications: I have reviewed the patient's current medications.  Assessment/Plan: 1. Adenocarcinoma the distal sigmoid colon, poorly differentiated, stage II (T3 N0), status post a laparoscopic assisted sigmoid colectomy 08/09/2015 ? No loss of mismatch repair protein expression ? Elevated preoperative CEA ? Staging CT of the abdomen and pelvis 06/22/2015 with no evidence of distant metastatic disease ? Mildly elevated CEA 02/23/2017And 04/17/2016 ? CTs chest, abdomen, and pelvis on 06/04/2016, 65m left upper lobe nodule-no comparison available, new hypodense lesion in the left liver, solid Right renal mass ? MRI  06/10/2016-2 enhancing lesions in the liver consistent with metastatic disease, segment 2 and segment 5. Solid enhancing right renal lesion consistent with a renal cell carcinoma ? Radiofrequency ablation of 2 liver lesions on 07/25/2016 ? Cycle 1 adjuvant Xeloda 08/18/2016 ? Cycle 2 Xeloda 09/08/2016 ? CEA improved 09/26/2016 ? Cycle 3 Xeloda held 09/29/2016 due to unexplained abdominal pain, referred for CT ? CT abdomen/pelvis 09/30/2016 with a long segment of bowel wall thickening involving the distal ileum; lesion left hepatic lobe similar to slightly smaller following ablation; lesion posterior right hepatic lobe elongated favored to represent treatment tract. ? Cycle 3 Xeloda 10/10/2016 (dose reduced due to drug induced enteritis following cycle 2)   2. Microcytic anemia-likely iron deficiency anemia secondary to colon cancer,Improved  3. Tubular villous adenoma on the sigmoid colon resection specimen 08/09/2015  Multiple tubular adenomas removed on a colonoscopy 11/16/2015  4. Right renal mass on CT 06/04/2016-suspicious for renal cell carcinoma   Disposition: Ms. BDementappears well. The abdominal pain is significantly better and diarrhea has resolved. She understands symptoms were most likely due to Xeloda induced enteritis. Dr. SBenay Spicerecommends resuming Xeloda at a reduced dose of 1000 mg twice daily for 14 days beginning 10/10/2016. She is in agreement with this plan. She understands to discontinue Xeloda and contact the office if symptoms recur.  She will return for a follow-up visit on 10/27/2016. She will contact the office in the interim as on above or with any other problems.  Patient seen with Dr. SBenay Spice    TNed CardANP/GNP-BC   10/07/2016  2:45 PM This was a shared visit with LNed Card Ms.  [place] appears well. It appears the pain and diarrhea were related to Xeloda enteritis. She will proceed with another cycle of Xeloda at a reduced dose. She  will discontinue Xeloda and contact us for recurrent pain or diarrhea.  Julieanne Manson, M.D.

## 2016-10-10 NOTE — Telephone Encounter (Signed)
Lab/fu for 12/8 cxd due to new orders given 11/21 for lab/fu 12/11. Left message confirming 12/11 lab/fu and 12/8 cxd.

## 2016-10-14 ENCOUNTER — Telehealth: Payer: Self-pay | Admitting: *Deleted

## 2016-10-14 NOTE — Telephone Encounter (Signed)
Oncology Nurse Navigator Documentation  Oncology Nurse Navigator Flowsheets 10/14/2016  Navigator Location [facility]  Referral date to RadOnc/MedOnc -  Navigator Encounter Type Telephone  Telephone Incoming Call;Appt Confirmation/Clarification  Patient Visit Type -  Treatment Phase -  Barriers/Navigation Needs -  Education -  Interventions -Clarified that her appointment on 12/8 was changed to 12/11. Reports she is tolerating her Xeloda at this time.  Coordination of Care -  Education Method -  Support Groups/Services -  Acuity -  Time Spent with Patient -

## 2016-10-15 ENCOUNTER — Encounter (HOSPITAL_COMMUNITY): Payer: Self-pay | Admitting: Emergency Medicine

## 2016-10-15 ENCOUNTER — Emergency Department (HOSPITAL_COMMUNITY)
Admission: EM | Admit: 2016-10-15 | Discharge: 2016-10-16 | Disposition: A | Discharge status: Home / Self Care | Payer: Medicare Other | Attending: Emergency Medicine | Admitting: Emergency Medicine

## 2016-10-15 ENCOUNTER — Emergency Department (HOSPITAL_COMMUNITY): Payer: Medicare Other

## 2016-10-15 DIAGNOSIS — Z7982 Long term (current) use of aspirin: Secondary | ICD-10-CM | POA: Diagnosis not present

## 2016-10-15 DIAGNOSIS — C787 Secondary malignant neoplasm of liver and intrahepatic bile duct: Secondary | ICD-10-CM

## 2016-10-15 DIAGNOSIS — Z8505 Personal history of malignant neoplasm of liver: Secondary | ICD-10-CM | POA: Diagnosis not present

## 2016-10-15 DIAGNOSIS — R1030 Lower abdominal pain, unspecified: Secondary | ICD-10-CM | POA: Diagnosis present

## 2016-10-15 DIAGNOSIS — K529 Noninfective gastroenteritis and colitis, unspecified: Secondary | ICD-10-CM | POA: Insufficient documentation

## 2016-10-15 DIAGNOSIS — Z85038 Personal history of other malignant neoplasm of large intestine: Secondary | ICD-10-CM | POA: Diagnosis not present

## 2016-10-15 DIAGNOSIS — C189 Malignant neoplasm of colon, unspecified: Secondary | ICD-10-CM

## 2016-10-15 LAB — URINALYSIS, ROUTINE W REFLEX MICROSCOPIC
Glucose, UA: NEGATIVE mg/dL
HGB URINE DIPSTICK: NEGATIVE
KETONES UR: NEGATIVE mg/dL
Nitrite: NEGATIVE
PROTEIN: NEGATIVE mg/dL
Specific Gravity, Urine: 1.029 (ref 1.005–1.030)
pH: 5 (ref 5.0–8.0)

## 2016-10-15 LAB — COMPREHENSIVE METABOLIC PANEL
ALK PHOS: 87 U/L (ref 38–126)
ALT: 20 U/L (ref 14–54)
AST: 38 U/L (ref 15–41)
Albumin: 4.3 g/dL (ref 3.5–5.0)
Anion gap: 9 (ref 5–15)
BUN: 23 mg/dL — ABNORMAL HIGH (ref 6–20)
CALCIUM: 9.4 mg/dL (ref 8.9–10.3)
CO2: 22 mmol/L (ref 22–32)
CREATININE: 1.12 mg/dL — AB (ref 0.44–1.00)
Chloride: 105 mmol/L (ref 101–111)
GFR, EST AFRICAN AMERICAN: 55 mL/min — AB (ref >60)
GFR, EST NON AFRICAN AMERICAN: 48 mL/min — AB (ref >60)
Glucose, Bld: 150 mg/dL — ABNORMAL HIGH (ref 65–99)
Potassium: 3.9 mmol/L (ref 3.5–5.1)
Sodium: 136 mmol/L (ref 135–145)
Total Bilirubin: 0.8 mg/dL (ref 0.3–1.2)
Total Protein: 7.3 g/dL (ref 6.5–8.1)

## 2016-10-15 LAB — CBC
HCT: 41.5 % (ref 36.0–46.0)
Hemoglobin: 13.8 g/dL (ref 12.0–15.0)
MCH: 29.2 pg (ref 26.0–34.0)
MCHC: 33.3 g/dL (ref 30.0–36.0)
MCV: 87.7 fL (ref 78.0–100.0)
PLATELETS: 283 10*3/uL (ref 150–400)
RBC: 4.73 MIL/uL (ref 3.87–5.11)
RDW: 19.5 % — ABNORMAL HIGH (ref 11.5–15.5)
WBC: 3.9 10*3/uL — AB (ref 4.0–10.5)

## 2016-10-15 LAB — URINE MICROSCOPIC-ADD ON
RBC / HPF: NONE SEEN RBC/hpf (ref 0–5)
SQUAMOUS EPITHELIAL / LPF: NONE SEEN

## 2016-10-15 LAB — LIPASE, BLOOD: Lipase: 22 U/L (ref 11–51)

## 2016-10-15 MED ORDER — SODIUM CHLORIDE 0.9 % IJ SOLN
INTRAMUSCULAR | Status: AC
  Filled 2016-10-15: qty 50

## 2016-10-15 MED ORDER — DIPHENHYDRAMINE HCL 50 MG/ML IJ SOLN
25.0000 mg | Freq: Once | INTRAMUSCULAR | Status: AC
  Administered 2016-10-15: 25 mg via INTRAVENOUS
  Filled 2016-10-15: qty 1

## 2016-10-15 MED ORDER — IOPAMIDOL (ISOVUE-300) INJECTION 61%
INTRAVENOUS | Status: AC
  Filled 2016-10-15: qty 100

## 2016-10-15 MED ORDER — MORPHINE SULFATE (PF) 4 MG/ML IV SOLN
6.0000 mg | Freq: Once | INTRAVENOUS | Status: AC
  Administered 2016-10-15: 6 mg via INTRAVENOUS
  Filled 2016-10-15: qty 2

## 2016-10-15 MED ORDER — IOPAMIDOL (ISOVUE-300) INJECTION 61%
100.0000 mL | Freq: Once | INTRAVENOUS | Status: AC | PRN
  Administered 2016-10-15: 100 mL via INTRAVENOUS

## 2016-10-15 NOTE — ED Notes (Signed)
Patient transported to CT 

## 2016-10-15 NOTE — ED Notes (Signed)
Pt's arm reassessed after benadryl given. Skin is back to original color with pt reporting no redness, edema, or itching at IV site.

## 2016-10-15 NOTE — ED Notes (Signed)
Called to room.  Pt with erythema & edema to RFA post morphine.  Pt c/o itching.

## 2016-10-15 NOTE — ED Triage Notes (Signed)
Pt reports lower abdominal pain, was treated for similar symptoms and Dx with bowel inflammation due to oral chemo. Pt sts recurrent abdominal pain and no relief des[pite meds. Last oral chemo this AM.  Hx colon and liver cancer. Denies further symptoms.

## 2016-10-16 ENCOUNTER — Telehealth: Payer: Self-pay | Admitting: *Deleted

## 2016-10-16 ENCOUNTER — Encounter: Payer: Self-pay | Admitting: Radiology

## 2016-10-16 ENCOUNTER — Other Ambulatory Visit (HOSPITAL_COMMUNITY): Payer: Self-pay | Admitting: Interventional Radiology

## 2016-10-16 DIAGNOSIS — C787 Secondary malignant neoplasm of liver and intrahepatic bile duct: Principal | ICD-10-CM

## 2016-10-16 DIAGNOSIS — C189 Malignant neoplasm of colon, unspecified: Secondary | ICD-10-CM

## 2016-10-16 NOTE — Telephone Encounter (Signed)
Oncology Nurse Navigator Documentation  Oncology Nurse Navigator Flowsheets 10/16/2016  Navigator Location [facility]  Referral date to RadOnc/MedOnc -  Navigator Encounter Type Telephone  Telephone Incoming Call;Symptom Mgt--went to ED last night and had CT scan and diagnosed with colitis again. She has stopped the Xeloda and asking what is next step? Does she need a PAC now?  Patient Visit Type -  Treatment Phase -  Barriers/Navigation Needs -  Education -  Interventions Other--MD notified  Coordination of Care -  Education Method -  Support Groups/Services -  Acuity -  Time Spent with Patient -

## 2016-10-16 NOTE — ED Provider Notes (Signed)
[facility] DEPT Provider Note   CSN: [id number] Arrival date & time: 10/15/16  1625     History   Chief Complaint Chief Complaint  Patient presents with  . Abdominal Pain    lower    HPI Nichole Cross is a 75 y.o. female.  HPI Patient has a history of metastatic liver cancer and a history of colon cancer and is presented emergency department worsening lower abdominal pain over the past 3-4 days.  She has similar bout of pain like this several weeks ago was diagnosed with colitis.  This is thought to be related to her oral chemotherapy agent.  She held her oral chemotherapy agent and her symptoms resolved.  She recently restarted her chemotherapy pill and now she is back to having pain again.  She is concerned it could be the same thing.  She's had some constipation without diarrhea.  No hematochezia.  No fevers or chills.  Denies nausea vomiting.  Pain is moderate in severity located in her lower abdomen.   Past Medical History:  Diagnosis Date  . Anemia   . Arthritis    knees  . Blood dyscrasia 2016   microcytic anemia, likely iron deficiency anemia secondary to colon cancer  . Cataracts, bilateral   . Colon cancer ([place]) 06/2015   tubular villous adenoma on sigmoid colon specimen (08/09/15), myltiple tubular adenomas removed on a colonoscopy  11/16/15  . Complication of anesthesia   . Headache(784.0)    migraines  . Hyperthyroidism   . PONV (postoperative nausea and vomiting)    NAUSEA  . Renal carcinoma ([place]) 06/2016   ? primary  . Secondary liver cancer ([place]) 06/2016   ? metastasis from colon    Patient Active Problem List   Diagnosis Date Noted  . Adenocarcinoma of colon metastatic to liver ([place]) 07/25/2016  . Osteoporosis 06/07/2016  . Anemia, iron deficiency 06/07/2016  . Sigmoid colon cancer s/p lap assisted sigmoid colectomy 08/09/15 08/09/2015  . Overweight (BMI 25.0-29.9) 09/10/2012  . S/P left UKR 09/09/2012  . ELEVATED BLOOD PRESSURE WITHOUT  DIAGNOSIS OF HYPERTENSION 09/30/2010  . GANGLION OF TENDON SHEATH 12/20/2007  . FATTY LIVER DISEASE 08/13/2007  . Hypothyroidism 08/02/2007  . CYSTITIS 08/02/2007  . Osteoarthritis 08/02/2007    Past Surgical History:  Procedure Laterality Date  . ABDOMINAL HYSTERECTOMY  1993   with bilateral BSO  . abdominal tumor  1993  . LAPAROSCOPIC PARTIAL COLECTOMY N/A 08/09/2015   Procedure: LAPAROSCOPIC PARTIAL COLECTOMY;  Surgeon: Jackolyn Confer, MD;  Location: [facility]S;  Service: General;  Laterality: N/A;  . PARTIAL KNEE ARTHROPLASTY  09/09/2012   Procedure: UNICOMPARTMENTAL KNEE;  Surgeon: Mauri Pole, MD;  Location: [facility];  Service: Orthopedics;  Laterality: Left;    OB History    No data available       Home Medications    Prior to Admission medications   Medication Sig Start Date End Date Taking? Authorizing Provider  aspirin-acetaminophen-caffeine (EXCEDRIN MIGRAINE) [id number] MG tablet Take 1 tablet by mouth every 6 (six) hours as needed for headache.   Yes Historical Provider, MD  capecitabine (XELODA) 500 MG tablet Take 2 tablets (1,000 mg total) by mouth 2 (two) times daily after a meal. Take for 14 days. Begin on 10/10/16 10/10/16  Yes Ladell Pier, MD  diphenhydramine-acetaminophen (TYLENOL PM) 25-500 MG TABS tablet Take 1 tablet by mouth at bedtime as needed.   Yes Historical Provider, MD  ferrous sulfate 325 (65 FE) MG tablet Take 1 tablet (325  mg total) by mouth 3 (three) times daily after meals. Patient taking differently: Take 325 mg by mouth 2 (two) times daily with a meal.  09/10/12  Yes Danae Orleans, PA-C  HYDROcodone-acetaminophen (NORCO) 5-325 MG tablet Take 1 tablet by mouth every 8 (eight) hours as needed for moderate pain. 10/02/16  Yes Owens Shark, NP  levothyroxine (SYNTHROID, LEVOTHROID) 137 MCG tablet Take 1 tablet (137 mcg total) by mouth daily before breakfast. 07/16/16  Yes [name], MD  polyethylene glycol (MIRALAX / GLYCOLAX) packet Take 17  g by mouth daily as needed for mild constipation. Reported on 06/05/2016   Yes Historical Provider, MD  prochlorperazine (COMPAZINE) 5 MG tablet Take 1 tablet (5 mg total) by mouth every 6 (six) hours as needed for nausea or vomiting. 08/14/16  Yes Ladell Pier, MD  traMADol (ULTRAM) 50 MG tablet Take 50 mg by mouth every 6 (six) hours as needed for moderate pain.  08/31/15  Yes Historical Provider, MD    Family History Family History  Problem Relation Age of Onset  . Pancreatic cancer Mother   . Parkinson's disease Mother   . Breast cancer Sister   . Parkinson's disease Brother   . Parkinson's disease Brother   . Colon cancer Neg Hx     Social History Social History  Substance Use Topics  . Smoking status: Never Smoker  . Smokeless tobacco: Never Used  . Alcohol use No     Allergies   Aspirin   Review of Systems Review of Systems  All other systems reviewed and are negative.    Physical Exam Updated Vital Signs BP 126/84   Pulse 86   Temp 97.6 F (36.4 C) (Oral)   Resp 18   SpO2 99%   Physical Exam  Constitutional: She is oriented to person, place, and time. She appears well-developed and well-nourished. No distress.  HENT:  Head: Normocephalic and atraumatic.  Eyes: EOM are normal.  Neck: Normal range of motion.  Cardiovascular: Normal rate, regular rhythm and normal heart sounds.   Pulmonary/Chest: Effort normal and breath sounds normal.  Abdominal: Soft. She exhibits no distension.  Lower abdominal tenderness.  No guarding or rebound.  Musculoskeletal: Normal range of motion.  Neurological: She is alert and oriented to person, place, and time.  Skin: Skin is warm and dry.  Psychiatric: She has a normal mood and affect. Judgment normal.  Nursing note and vitals reviewed.    ED Treatments / Results  Labs (all labs ordered are listed, but only abnormal results are displayed) Labs Reviewed  COMPREHENSIVE METABOLIC PANEL - Abnormal; Notable for the  following:       Result Value   Glucose, Bld 150 (*)    BUN 23 (*)    Creatinine, Ser 1.12 (*)    GFR calc non Af Amer 48 (*)    GFR calc Af Amer 55 (*)    All other components within normal limits  CBC - Abnormal; Notable for the following:    WBC 3.9 (*)    RDW 19.5 (*)    All other components within normal limits  URINALYSIS, ROUTINE W REFLEX MICROSCOPIC (NOT AT [facility]) - Abnormal; Notable for the following:    Color, Urine AMBER (*)    Bilirubin Urine MODERATE (*)    Leukocytes, UA TRACE (*)    All other components within normal limits  URINE MICROSCOPIC-ADD ON - Abnormal; Notable for the following:    Bacteria, UA RARE (*)  Crystals CA OXALATE CRYSTALS (*)    All other components within normal limits  LIPASE, BLOOD    EKG  EKG Interpretation None       Radiology Ct Abdomen Pelvis W Contrast  Result Date: 10/15/2016 CLINICAL DATA:  History of liver in colon cancer, with recurrent lower abdominal pain and bowel inflammation due to oral chemotherapy. Status post liver radiofrequency ablation and colon surgery. EXAM: CT ABDOMEN AND PELVIS WITH CONTRAST TECHNIQUE: Multidetector CT imaging of the abdomen and pelvis was performed using the standard protocol following bolus administration of intravenous contrast. CONTRAST:  122mL ISOVUE-300 IOPAMIDOL (ISOVUE-300) INJECTION 61% COMPARISON:  CT abdomen and pelvis September 30, 2016 FINDINGS: LOWER CHEST: Lung bases are clear. Included heart size is normal. No pericardial effusion. HEPATOBILIARY: Stable 15 x 15 mm mass in LEFT lobe of the liver with capsular retraction. Stable 12 x 17 mm RIGHT segment 6 lesion and adjacent 23 mm lesion. Patent portal vein. Normal gallbladder. PANCREAS: Normal. SPLEEN: Stable sub cm presumed cyst. ADRENALS/URINARY TRACT: Kidneys are orthotopic, demonstrating symmetric enhancement. No nephrolithiasis, hydronephrosis. Stable appearance of 2.5 cm solid RIGHT upper pole renal mass. The unopacified ureters are  normal in course and caliber. Delayed imaging through the kidneys demonstrates symmetric prompt contrast excretion within the proximal urinary collecting system. Urinary bladder is partially distended and unremarkable. Thickened adrenal glands without discrete mass. STOMACH/BOWEL: Sigmoid bowel anastomosis. Circumferential descending colonic wall thickening and mild pericolonic inflammation. The stomach, small bowel are normal in course and caliber without inflammatory changes. Moderate amount of retained large bowel stool. VASCULAR/LYMPHATIC: Aortoiliac vessels are normal in course and caliber. No lymphadenopathy by CT size criteria. REPRODUCTIVE: Normal. OTHER: No intraperitoneal free fluid or free air. MUSCULOSKELETAL: Nonacute. Anterior pelvic wall scarring. Severe L5-S1 degenerative disc. No destructive bony lesions. IMPRESSION: Descending colitis may be infectious or treatment related. No complication. Moderate amount of retained large bowel stool without bowel obstruction. No evidence of residual nor recurrent disease. Stable treated hepatic lesions. Stable solid RIGHT renal mass. Electronically Signed   By: Elon Alas M.D.   On: 10/15/2016 22:54    Procedures Procedures (including critical care time)  Medications Ordered in ED Medications  iopamidol (ISOVUE-300) 61 % injection (not administered)  sodium chloride 0.9 % injection (not administered)  morphine 4 MG/ML injection 6 mg (6 mg Intravenous Given 10/15/16 2144)  diphenhydrAMINE (BENADRYL) injection 25 mg (25 mg Intravenous Given 10/15/16 2226)  iopamidol (ISOVUE-300) 61 % injection 100 mL (100 mLs Intravenous Contrast Given 10/15/16 2231)     Initial Impression / Assessment and Plan / ED Course  I have reviewed the triage vital signs and the nursing notes.  Pertinent labs & imaging results that were available during my care of the patient were reviewed by me and considered in my medical decision making (see chart for  details).  Clinical Course     CT scan concerning for again for recurrent colitis.  Patient is scheduled to have a colonoscopy on December 14.  She will follow-up with her oncology team tomorrow.  She has pain medication at home.  I do not think she needs antibiotics at this time.  This is likely related to her chemotherapy.  She understands to return to the ER for new or worsening symptoms  Final Clinical Impressions(s) / ED Diagnoses   Final diagnoses:  Colitis    New Prescriptions New Prescriptions   No medications on file     Jola Schmidt, MD 10/16/16 [phone]

## 2016-10-16 NOTE — Telephone Encounter (Signed)
Call from [facility] [place]. Pt will need INR and tumor marker drawn with next lab appt here. Pt has follow up with Dr. Laurence Ferrari on 11/04/16. Will make phlebotomists aware of additional orders.

## 2016-10-16 NOTE — Telephone Encounter (Signed)
Returned call to pt: Per Dr. Benay Spice: HOLD XELODA. Follow up with GI. Will move office visit to 10/31/16 after colonoscopy. Pt voiced understanding. Message to schedulers.

## 2016-10-17 ENCOUNTER — Other Ambulatory Visit: Payer: Self-pay | Admitting: *Deleted

## 2016-10-17 ENCOUNTER — Ambulatory Visit (AMBULATORY_SURGERY_CENTER): Payer: Self-pay

## 2016-10-17 ENCOUNTER — Telehealth: Payer: Self-pay | Admitting: *Deleted

## 2016-10-17 VITALS — Ht 67.0 in | Wt 175.4 lb

## 2016-10-17 DIAGNOSIS — C187 Malignant neoplasm of sigmoid colon: Secondary | ICD-10-CM

## 2016-10-17 DIAGNOSIS — C787 Secondary malignant neoplasm of liver and intrahepatic bile duct: Principal | ICD-10-CM

## 2016-10-17 DIAGNOSIS — C189 Malignant neoplasm of colon, unspecified: Secondary | ICD-10-CM

## 2016-10-17 MED ORDER — SUPREP BOWEL PREP KIT 17.5-3.13-1.6 GM/177ML PO SOLN: 1.0000 | Freq: Once | ORAL | 0 refills | Status: AC

## 2016-10-17 NOTE — Telephone Encounter (Signed)
Message received from Jocelyn Lamer with [facility] requesting a cea to be drawn per order of Dr. Laurence Ferrari at patient's next visit on 10/27/16.  Per Dr. Benay Spice, cea ordered.

## 2016-10-17 NOTE — Progress Notes (Signed)
No allergies to eggs or soy No past problems with anesthesia except morphine No diet meds No home oxygen  Declined emmi

## 2016-10-20 ENCOUNTER — Encounter: Payer: Self-pay | Admitting: Internal Medicine

## 2016-10-24 ENCOUNTER — Other Ambulatory Visit: Payer: Medicare Other

## 2016-10-24 ENCOUNTER — Ambulatory Visit: Payer: Medicare Other | Admitting: Oncology

## 2016-10-27 ENCOUNTER — Telehealth: Payer: Self-pay | Admitting: Internal Medicine

## 2016-10-27 ENCOUNTER — Ambulatory Visit: Payer: Medicare Other | Admitting: Oncology

## 2016-10-27 ENCOUNTER — Other Ambulatory Visit: Payer: Medicare Other

## 2016-10-27 DIAGNOSIS — C187 Malignant neoplasm of sigmoid colon: Secondary | ICD-10-CM

## 2016-10-27 MED ORDER — NA SULFATE-K SULFATE-MG SULF 17.5-3.13-1.6 GM/177ML PO SOLN: ORAL | 0 refills | Status: DC

## 2016-10-27 NOTE — Telephone Encounter (Signed)
Suprep sent to pharmacy.  LMOM that this was done and to call back if needed

## 2016-10-30 ENCOUNTER — Ambulatory Visit (AMBULATORY_SURGERY_CENTER): Payer: Medicare Other | Admitting: Internal Medicine

## 2016-10-30 ENCOUNTER — Encounter: Payer: Self-pay | Admitting: Internal Medicine

## 2016-10-30 VITALS — BP 94/55 | HR 84 | Temp 97.3°F | Resp 18 | Ht 67.0 in | Wt 175.0 lb

## 2016-10-30 DIAGNOSIS — Z85038 Personal history of other malignant neoplasm of large intestine: Secondary | ICD-10-CM

## 2016-10-30 DIAGNOSIS — K5730 Diverticulosis of large intestine without perforation or abscess without bleeding: Secondary | ICD-10-CM | POA: Diagnosis not present

## 2016-10-30 MED ORDER — SODIUM CHLORIDE 0.9 % IV SOLN: 500.0000 mL | INTRAVENOUS | Status: DC

## 2016-10-30 NOTE — Progress Notes (Signed)
Called to room to assist during endoscopic procedure.  Patient ID and intended procedure confirmed with present staff. Received instructions for my participation in the procedure from the performing physician.  

## 2016-10-30 NOTE — Progress Notes (Signed)
Patient awakening,vss,report to rn 

## 2016-10-30 NOTE — Patient Instructions (Signed)
YOU HAD AN ENDOSCOPIC PROCEDURE TODAY AT [facility]:   Refer to the procedure report that was given to you for any specific questions about what was found during the examination.  If the procedure report does not answer your questions, please call your gastroenterologist to clarify.  If you requested that your care partner not be given the details of your procedure findings, then the procedure report has been included in a sealed envelope for you to review at your convenience later.  YOU SHOULD EXPECT: Some feelings of bloating in the abdomen. Passage of more gas than usual.  Walking can help get rid of the air that was put into your GI tract during the procedure and reduce the bloating. If you had a lower endoscopy (such as a colonoscopy or flexible sigmoidoscopy) you may notice spotting of blood in your stool or on the toilet paper. If you underwent a bowel prep for your procedure, you may not have a normal bowel movement for a few days.  Please Note:  You might notice some irritation and congestion in your nose or some drainage.  This is from the oxygen used during your procedure.  There is no need for concern and it should clear up in a day or so.  SYMPTOMS TO REPORT IMMEDIATELY:   Following lower endoscopy (colonoscopy or flexible sigmoidoscopy):  Excessive amounts of blood in the stool  Significant tenderness or worsening of abdominal pains  Swelling of the abdomen that is new, acute  Fever of 100F or higher   For urgent or emergent issues, a gastroenterologist can be reached at any hour by calling [phone].   DIET:  We do recommend a small meal at first, but then proceed to a low fiber diet..  Drink plenty of fluids but you should avoid alcoholic beverages for 24 hours.  ACTIVITY:  You should plan to take it easy for the rest of today and you should NOT DRIVE or use heavy machinery until tomorrow (because of the sedation medicines used during the test).     FOLLOW UP: Our staff will call the number listed on your records the next business day following your procedure to check on you and address any questions or concerns that you may have regarding the information given to you following your procedure. If we do not reach you, we will leave a message.  However, if you are feeling well and you are not experiencing any problems, there is no need to return our call.  We will assume that you have returned to your regular daily activities without incident.  If any biopsies were taken you will be contacted by phone or by letter within the next 1-3 weeks.  Please call us at [phone] if you have not heard about the biopsies in 3 weeks.    SIGNATURES/CONFIDENTIALITY: You and/or your care partner have signed paperwork which will be entered into your electronic medical record.  These signatures attest to the fact that that the information above on your After Visit Summary has been reviewed and is understood.  Full responsibility of the confidentiality of this discharge information lies with you and/or your care-partner.  Be sure to keep your appointment with Dr.Sherrill tomorrow. Repeat colonoscopy in 1 year.  Low fiber sheet included in discharge instructions.

## 2016-10-30 NOTE — Op Note (Signed)
[facility] Patient Name: Nichole Cross Procedure Date: 10/30/2016 1:49 PM MRN: [id number] Endoscopist: Jerene Bears , MD Age: 75 Referring MD:  Date of Birth: [birth year] Gender: Female Account #: [id number] Procedure:                Colonoscopy Indications:              High risk colon cancer surveillance: Personal                            history of metastatic colon cancer s/p left                            hemicolectomy, Last colonoscopy: December 2016;                            recent abdominal pain and left-sided colitis seen                            by CT scan felt related to Xeloda (Xeloda currently                            on hold) Medicines:                Monitored Anesthesia Care Procedure:                Pre-Anesthesia Assessment:                           - Prior to the procedure, a History and Physical                            was performed, and patient medications and                            allergies were reviewed. The patient's tolerance of                            previous anesthesia was also reviewed. The risks                            and benefits of the procedure and the sedation                            options and risks were discussed with the patient.                            All questions were answered, and informed consent                            was obtained. Prior Anticoagulants: The patient has                            taken no previous anticoagulant or antiplatelet  agents. ASA Grade Assessment: III - A patient with                            severe systemic disease. After reviewing the risks                            and benefits, the patient was deemed in                            satisfactory condition to undergo the procedure.                           After obtaining informed consent, the colonoscope                            was passed under direct vision. Throughout the                   procedure, the patient's blood pressure, pulse, and                            oxygen saturations were monitored continuously. The                            Model PCF-[id number]) scope was introduced                            through the anus and advanced to the the cecum,                            identified by appendiceal orifice and ileocecal                            valve. The colonoscopy was somewhat difficult due                            to a partially obstructing mass. Successful                            completion of the procedure was aided by                            withdrawing the scope and replacing with the adult                            endoscope. The patient tolerated the procedure                            well. The quality of the bowel preparation was                            fair. The ileocecal valve, appendiceal orifice, and  rectum were photographed. Scope In: 1:58:09 PM Scope Out: 2:24:34 PM Scope Withdrawal Time: 0 hours 12 minutes 24 seconds  Total Procedure Duration: 0 hours 26 minutes 25 seconds  Findings:                 The digital rectal exam was normal.                           An infiltrative partially obstructing mass was                            found at the colo-colonic anastomosis. The mass was                            circumferential. The mass measured two cm in                            length. Oozing was present. Multiple biopsies were                            obtained with cold forceps for histology in a                            targeted manner at the anastomosis. The adult                            endoscope was required to traverse the mass lesion                            at the anastomosis.                           A few diverticula were found in the descending                            colon.                           Internal hemorrhoids were found during                             retroflexion. The hemorrhoids were small.                           The exam was otherwise without abnormality. Complications:            No immediate complications. Estimated Blood Loss:     Estimated blood loss was minimal. Impression:               - Preparation of the colon was fair.                           - Likely malignant partially obstructing tumor at                            the colonic anastomosis representing tumor  recurrence. Multiple biopsies.                           - Diverticulosis in the descending colon.                           - Internal hemorrhoids.                           - The examination was otherwise normal. Recommendation:           - Patient has a contact number available for                            emergencies. The signs and symptoms of potential                            delayed complications were discussed with the                            patient. Return to normal activities tomorrow.                            Written discharge instructions were provided to the                            patient.                           - Low fiber diet.                           - Continue present medications including daily                            MiraLax                           - Await pathology results.                           - Close follow-up with Dr. Benay Spice and likely                            surgery.                           - Repeat colonoscopy in 1 year for surveillance. Jerene Bears, MD 10/30/2016 2:35:39 PM This report has been signed electronically. CC Letter to:             Dominica Severin B. Sherrill

## 2016-10-30 NOTE — Progress Notes (Signed)
1428. Call placed to [facility] for [facility] pick up of specimen.

## 2016-10-31 ENCOUNTER — Telehealth: Payer: Self-pay | Admitting: *Deleted

## 2016-10-31 ENCOUNTER — Encounter: Payer: Self-pay | Admitting: General Surgery

## 2016-10-31 ENCOUNTER — Other Ambulatory Visit: Payer: Self-pay | Admitting: Oncology

## 2016-10-31 ENCOUNTER — Ambulatory Visit (HOSPITAL_BASED_OUTPATIENT_CLINIC_OR_DEPARTMENT_OTHER): Payer: Medicare Other | Admitting: Oncology

## 2016-10-31 ENCOUNTER — Telehealth: Payer: Self-pay | Admitting: Oncology

## 2016-10-31 ENCOUNTER — Other Ambulatory Visit (HOSPITAL_BASED_OUTPATIENT_CLINIC_OR_DEPARTMENT_OTHER): Payer: Medicare Other

## 2016-10-31 DIAGNOSIS — R93421 Abnormal radiologic findings on diagnostic imaging of right kidney: Secondary | ICD-10-CM

## 2016-10-31 DIAGNOSIS — D509 Iron deficiency anemia, unspecified: Secondary | ICD-10-CM | POA: Diagnosis not present

## 2016-10-31 DIAGNOSIS — C189 Malignant neoplasm of colon, unspecified: Secondary | ICD-10-CM

## 2016-10-31 DIAGNOSIS — C187 Malignant neoplasm of sigmoid colon: Secondary | ICD-10-CM | POA: Diagnosis not present

## 2016-10-31 DIAGNOSIS — C787 Secondary malignant neoplasm of liver and intrahepatic bile duct: Principal | ICD-10-CM

## 2016-10-31 LAB — COMPREHENSIVE METABOLIC PANEL
ALT: 13 U/L (ref 0–55)
ANION GAP: 10 meq/L (ref 3–11)
AST: 25 U/L (ref 5–34)
Albumin: 3.8 g/dL (ref 3.5–5.0)
Alkaline Phosphatase: 102 U/L (ref 40–150)
BILIRUBIN TOTAL: 0.69 mg/dL (ref 0.20–1.20)
BUN: 12 mg/dL (ref 7.0–26.0)
CHLORIDE: 110 meq/L — AB (ref 98–109)
CO2: 23 mEq/L (ref 22–29)
Calcium: 9.6 mg/dL (ref 8.4–10.4)
Creatinine: 1.3 mg/dL — ABNORMAL HIGH (ref 0.6–1.1)
EGFR: 40 mL/min/{1.73_m2} — AB (ref >90)
Glucose: 107 mg/dl (ref 70–140)
Potassium: 4.3 mEq/L (ref 3.5–5.1)
Sodium: 143 mEq/L (ref 136–145)
Total Protein: 7.2 g/dL (ref 6.4–8.3)

## 2016-10-31 LAB — CBC WITH DIFFERENTIAL/PLATELET
BASO%: 2.2 % — ABNORMAL HIGH (ref 0.0–2.0)
Basophils Absolute: 0.1 10*3/uL (ref 0.0–0.1)
EOS ABS: 0.1 10*3/uL (ref 0.0–0.5)
EOS%: 3.5 % (ref 0.0–7.0)
HCT: 39.7 % (ref 34.8–46.6)
HGB: 13.2 g/dL (ref 11.6–15.9)
LYMPH%: 21.4 % (ref 14.0–49.7)
MCH: 29.3 pg (ref 25.1–34.0)
MCHC: 33.2 g/dL (ref 31.5–36.0)
MCV: 88.2 fL (ref 79.5–101.0)
MONO#: 0.4 10*3/uL (ref 0.1–0.9)
MONO%: 11.3 % (ref 0.0–14.0)
NEUT#: 2 10*3/uL (ref 1.5–6.5)
NEUT%: 61.6 % (ref 38.4–76.8)
PLATELETS: 210 10*3/uL (ref 145–400)
RBC: 4.5 10*6/uL (ref 3.70–5.45)
RDW: 19.8 % — ABNORMAL HIGH (ref 11.2–14.5)
WBC: 3.2 10*3/uL — ABNORMAL LOW (ref 3.9–10.3)
lymph#: 0.7 10*3/uL — ABNORMAL LOW (ref 0.9–3.3)

## 2016-10-31 LAB — CEA (IN HOUSE-CHCC): CEA (CHCC-IN HOUSE): 3.72 ng/mL (ref 0.00–5.00)

## 2016-10-31 MED ORDER — TRAMADOL HCL 50 MG PO TABS: 50.0000 mg | ORAL_TABLET | Freq: Four times a day (QID) | ORAL | 0 refills | Status: DC | PRN

## 2016-10-31 MED ORDER — CAPECITABINE 500 MG PO TABS: 1000.0000 mg | ORAL_TABLET | Freq: Two times a day (BID) | ORAL | 0 refills | Status: DC

## 2016-10-31 MED FILL — CAPECITABINE 500 MG TABLET: 500 | 21 days supply | Qty: 56 | Fill #0

## 2016-10-31 NOTE — Progress Notes (Signed)
[facility] OFFICE PROGRESS NOTE   Diagnosis: Colon cancer  INTERVAL HISTORY:   Nichole Cross returns as scheduled. She developed low abdominal pain 10/15/2016 and was seen in the emergency room. She discontinued the Xeloda after approximately one week. A CT revealed circumferential descending colonic wall thickening consistent with colitis. Stable solid right renal mass.  She reports the abdominal pain has resolved. No complaint today. She was referred to Dr. Hilarie Fredrickson and was taken to a colonoscopy on 10/30/2016. A partially trucking mass was found at the colocolonic anastomosis oozing was present. Multiple biopsies were obtained.  No mouth sores, diarrhea, or hand/foot pain while on Xeloda.  Objective:  Vital signs in last 24 hours:  Blood pressure (!) 150/66, pulse 62, temperature 97.5 F (36.4 C), temperature source Oral, resp. rate 18, height _0  (1.702 m), weight 175 lb 9.6 oz (79.7 kg), SpO2 100 %.    HEENT: Neck without mass Lymphatics: No cervical, supra-clavicular, axillary, or inguinal nodes Resp: Lungs clear bilaterally Cardio: Regular rate and rhythm GI: No hepatosplenomegaly, no mass, nontender Vascular: No leg edema   Lab Results:  Lab Results  Component Value Date   WBC 3.2 (L) 10/31/2016   HGB 13.2 10/31/2016   HCT 39.7 10/31/2016   MCV 88.2 10/31/2016   PLT 210 10/31/2016   NEUTROABS 2.0 10/31/2016   CEA-3.72    Medications: I have reviewed the patient's current medications.  Assessment/Plan: 1. Adenocarcinoma the distal sigmoid colon, poorly differentiated, stage II (T3 N0), status post a laparoscopic assisted sigmoid colectomy 08/09/2015 ? No loss of mismatch repair protein expression ? Elevated preoperative CEA ? Staging CT of the abdomen and pelvis 06/22/2015 with no evidence of distant metastatic disease ? Mildly elevated CEA 02/23/2017And 04/17/2016 ? CTs chest, abdomen, and pelvis on 06/04/2016, 68m left upper lobe nodule-no  comparison available, new hypodense lesion in the left liver, solid Right renal mass ? MRI 06/10/2016-2 enhancing lesions in the liver consistent with metastatic disease, segment 2 and segment 5. Solid enhancing right renal lesion consistent with a renal cell carcinoma ? Radiofrequency ablation of 2 liver lesions on 07/25/2016 ? Cycle 1 adjuvant Xeloda 08/18/2016 ? Cycle 2 Xeloda 09/08/2016 ? CEA improved 09/26/2016 ? Cycle 3 Xeloda held 09/29/2016 due to unexplained abdominal pain,referred for CT ? CT abdomen/pelvis 09/30/2016 with a long segment of bowel wall thickening involving the distal ileum; lesion left hepatic lobe similar to slightly smaller following ablation; lesion posterior right hepatic lobe elongated favored to represent treatment tract. ? Cycle 3 Xeloda 10/10/2016 (dose reduced due to drug induced enteritis following cycle 2) discontinued 10/15/2016 secondary to abdominal pain. ? CT in while the emergency room with abdominal pain 10/15/2016 revealed descending colitis ? Colonoscopy 10/30/2016 confirmed a mass at the: colo-colonic anastomosis ? Cycle 4 Xeloda 11/03/2016   2. Microcytic anemia-likely iron deficiency anemia secondary to colon cancer,Improved  3. Tubular villous adenoma on the sigmoid colon resection specimen 08/09/2015  Multiple tubular adenomas removed on a colonoscopy 11/16/2015  4. Right renal mass on CT 06/04/2016-suspicious for renal cell carcinoma    Disposition:  Ms. BHarvieappears well today. She completed part of cycle 3 adjuvant Xeloda prior to presenting with recurrent abdominal pain. The CEA has normalized. The pain was likely related to recurrent tumor at the colonic anastomosis. She underwent ablation of 2 liver lesions in September. There is no other evidence of distant metastatic disease. I think resection of the local recurrence should be considered. I contacted Dr. RZella Richerand he will arrange  for a surgical consult. I will  present her case at the GI tumor conference on 11/12/2016.  Plans for reevaluation of the liver ablation sites and renal mass will be placed on hold.  She will begin another cycle of adjuvant Xeloda on 11/03/2016. She will contact us for recurrent abdominal pain. Nichole Cross will return for an office visit on 11/20/2016.  Approximately 40 minutes were spent with the patient today.  [name], MD  10/31/2016  1:15 PM

## 2016-10-31 NOTE — Telephone Encounter (Signed)
Left message on f/u call 

## 2016-10-31 NOTE — Telephone Encounter (Signed)
Gave patient avs report and appointments for January.  Per 12/15 los - cx mri and IR appointments and appointment with Dr. Alyson Ingles at [place]. Cancelled mri/IR appointments. Patient to cancel urology appointment.   Per patient appointment with Dr. Laurence Ferrari is also to be cancelled. No request from [facility] to cx appointment with Dr. Laurence Ferrari. Patient will call and make sure she had not other appointments with Dr. Laurence Ferrari.

## 2016-10-31 NOTE — Addendum Note (Signed)
Addended by: Brien Few on: 10/31/2016 02:35 PM   Modules accepted: Orders

## 2016-10-31 NOTE — Progress Notes (Signed)
Spoke with Dr. Benay Spice about her.  She appears to have a recurrence of her colorectal cancer at the colorectal anastomosis on colonoscopy.  Biopsy results are pending.  I have arranged for her to see Dr. Leighton Ruff regarding this.

## 2016-11-03 ENCOUNTER — Telehealth: Payer: Self-pay | Admitting: *Deleted

## 2016-11-03 NOTE — Telephone Encounter (Signed)
Oncology Nurse Navigator Documentation  Oncology Nurse Navigator Flowsheets 11/03/2016  Navigator Location [facility]  Referral date to RadOnc/MedOnc -  Navigator Encounter Type Telephone  Telephone Incoming Call;Appt Confirmation/Clarification--told NN that Dr. Benay Spice told her to cancel her appointment on 12/19 for Dr. Geroge Baseman in IR and with Dr. Alyson Ingles at [facility]. She does not know how to reach them.  Patient Visit Type -  Treatment Phase -  Barriers/Navigation Needs -  Education -  Interventions Other--provided her phone # for [facility] and informed her that the IR appointment had already been cancelled.  Coordination of Care -  Education Method -  Support Groups/Services -  Acuity Level 1  Time Spent with Patient -

## 2016-11-04 ENCOUNTER — Ambulatory Visit (HOSPITAL_COMMUNITY): Payer: Medicare Other

## 2016-11-04 ENCOUNTER — Inpatient Hospital Stay: Admission: RE | Admit: 2016-11-04 | Payer: Medicare Other | Source: Ambulatory Visit

## 2016-11-17 HISTORY — PX: RADIOFREQUENCY ABLATION LIVER TUMOR: SHX2293

## 2016-11-20 ENCOUNTER — Encounter: Payer: Self-pay | Admitting: Interventional Radiology

## 2016-11-20 ENCOUNTER — Ambulatory Visit (HOSPITAL_BASED_OUTPATIENT_CLINIC_OR_DEPARTMENT_OTHER): Payer: Medicare Other | Admitting: Nurse Practitioner

## 2016-11-20 ENCOUNTER — Telehealth: Payer: Self-pay | Admitting: Oncology

## 2016-11-20 ENCOUNTER — Other Ambulatory Visit (HOSPITAL_BASED_OUTPATIENT_CLINIC_OR_DEPARTMENT_OTHER): Payer: Medicare Other

## 2016-11-20 VITALS — BP 155/73 | HR 66 | Temp 97.7°F | Resp 18 | Ht 67.0 in | Wt 176.4 lb

## 2016-11-20 DIAGNOSIS — D509 Iron deficiency anemia, unspecified: Secondary | ICD-10-CM | POA: Diagnosis not present

## 2016-11-20 DIAGNOSIS — C187 Malignant neoplasm of sigmoid colon: Secondary | ICD-10-CM

## 2016-11-20 DIAGNOSIS — R93421 Abnormal radiologic findings on diagnostic imaging of right kidney: Secondary | ICD-10-CM

## 2016-11-20 LAB — COMPREHENSIVE METABOLIC PANEL
ALBUMIN: 4 g/dL (ref 3.5–5.0)
ALK PHOS: 103 U/L (ref 40–150)
ALT: 15 U/L (ref 0–55)
ANION GAP: 8 meq/L (ref 3–11)
AST: 26 U/L (ref 5–34)
BILIRUBIN TOTAL: 0.62 mg/dL (ref 0.20–1.20)
BUN: 13 mg/dL (ref 7.0–26.0)
CALCIUM: 9.6 mg/dL (ref 8.4–10.4)
CO2: 25 mEq/L (ref 22–29)
CREATININE: 1 mg/dL (ref 0.6–1.1)
Chloride: 108 mEq/L (ref 98–109)
EGFR: 56 mL/min/{1.73_m2} — AB (ref >90)
Glucose: 110 mg/dl (ref 70–140)
Potassium: 3.7 mEq/L (ref 3.5–5.1)
Sodium: 142 mEq/L (ref 136–145)
TOTAL PROTEIN: 7.1 g/dL (ref 6.4–8.3)

## 2016-11-20 LAB — CBC WITH DIFFERENTIAL/PLATELET
BASO%: 1.3 % (ref 0.0–2.0)
Basophils Absolute: 0.1 10*3/uL (ref 0.0–0.1)
EOS ABS: 0.1 10*3/uL (ref 0.0–0.5)
EOS%: 3.2 % (ref 0.0–7.0)
HEMATOCRIT: 38.2 % (ref 34.8–46.6)
HGB: 13.1 g/dL (ref 11.6–15.9)
LYMPH#: 0.9 10*3/uL (ref 0.9–3.3)
LYMPH%: 24.3 % (ref 14.0–49.7)
MCH: 30.3 pg (ref 25.1–34.0)
MCHC: 34.3 g/dL (ref 31.5–36.0)
MCV: 88.2 fL (ref 79.5–101.0)
MONO#: 0.3 10*3/uL (ref 0.1–0.9)
MONO%: 8.3 % (ref 0.0–14.0)
NEUT%: 62.9 % (ref 38.4–76.8)
NEUTROS ABS: 2.4 10*3/uL (ref 1.5–6.5)
PLATELETS: 211 10*3/uL (ref 145–400)
RBC: 4.33 10*6/uL (ref 3.70–5.45)
RDW: 18.8 % — ABNORMAL HIGH (ref 11.2–14.5)
WBC: 3.7 10*3/uL — AB (ref 3.9–10.3)

## 2016-11-20 LAB — CEA (IN HOUSE-CHCC): CEA (CHCC-In House): 4.39 ng/mL (ref 0.00–5.00)

## 2016-11-20 NOTE — Progress Notes (Addendum)
[facility] OFFICE PROGRESS NOTE   Diagnosis:  Colon cancer  INTERVAL HISTORY:   Nichole Cross returns as scheduled. She completed cycle 4 Xeloda beginning 11/03/2016. She feels well. No nausea or vomiting. No mouth sores. No diarrhea. No hand or foot pain or redness. She has occasional left lower abdominal pain. She reports a good appetite.  Objective:  Vital signs in last 24 hours:  Blood pressure (!) 155/73, pulse 66, temperature 97.7 F (36.5 C), temperature source Oral, resp. rate 18, height _0  (1.702 m), weight 176 lb 6.4 oz (80 kg), SpO2 99 %.    HEENT: No thrush or ulcers. Resp: Lungs clear bilaterally. Cardio: Regular rate and rhythm. GI: Abdomen soft and nontender. No hepatomegaly. Vascular: No leg edema. Skin: Palms without erythema.    Lab Results:  Lab Results  Component Value Date   WBC 3.7 (L) 11/20/2016   HGB 13.1 11/20/2016   HCT 38.2 11/20/2016   MCV 88.2 11/20/2016   PLT 211 11/20/2016   NEUTROABS 2.4 11/20/2016    Imaging:  No results found.  Medications: I have reviewed the patient's current medications.  Assessment/Plan: 1. Adenocarcinoma the distal sigmoid colon, poorly differentiated, stage II (T3 N0), status post a laparoscopic assisted sigmoid colectomy 08/09/2015 ? No loss of mismatch repair protein expression ? Elevated preoperative CEA ? Staging CT of the abdomen and pelvis 06/22/2015 with no evidence of distant metastatic disease ? Mildly elevated CEA 02/23/2017And 04/17/2016 ? CTs chest, abdomen, and pelvis on 06/04/2016, 65m left upper lobe nodule-no comparison available, new hypodense lesion in the left liver, solid Right renal mass ? MRI 06/10/2016-2 enhancing lesions in the liver consistent with metastatic disease, segment 2 and segment 5. Solid enhancing right renal lesion consistent with a renal cell carcinoma ? Radiofrequency ablation of 2 liver lesions on 07/25/2016 ? Cycle 1 adjuvant Xeloda  08/18/2016 ? Cycle 2 Xeloda 09/08/2016 ? CEA improved 09/26/2016 ? Cycle 3 Xeloda held 09/29/2016 due to unexplained abdominal pain,referred for CT ? CT abdomen/pelvis 09/30/2016 with a long segment of bowel wall thickening involving the distal ileum; lesion left hepatic lobe similar to slightly smaller following ablation; lesion posterior right hepatic lobe elongated favored to represent treatment tract. ? Cycle 3 Xeloda 10/10/2016 (dose reduced due to drug induced enteritis following cycle 2) discontinued 10/15/2016 secondary to abdominal pain. ? CT in while the emergency room with abdominal pain 10/15/2016 revealed descending colitis ? Colonoscopy 10/30/2016 confirmed a mass at the: colo-colonic anastomosis ? Cycle 4 Xeloda 11/03/2016   2. Microcytic anemia-likely iron deficiency anemia secondary to colon cancer,Improved  3. Tubular villous adenoma on the sigmoid colon resection specimen 08/09/2015  Multiple tubular adenomas removed on a colonoscopy 11/16/2015  4. Right renal mass on CT 06/04/2016-suspicious for renal cell carcinoma   Disposition: Ms. BMckercherappears stable. She has completed 4 cycles of Xeloda. Dr. SBenay Spicerecommends placing further Xeloda on hold pending her appointment with Dr. ALeighton [id number] We scheduled a follow-up visit here in approximately 4 weeks. We will adjust that appointment accordingly pending the decision regarding surgical resection of the local recurrence.  She will contact the office prior to her next visit with any problems.  Patient seen with Dr. SBenay Spice    TNed CardANP/GNP-BC   11/20/2016  1:53 PM  This was a shared visit with LNed Card Ms. BCottamwill discontinue Xeloda chemotherapy. Her case was presented at the GI tumor conference on 11/19/2016. She will see Dr. TMarcello Mooresnext week to discuss surgical options.  We will see her after surgery to discuss the indication for additional chemotherapy.  Julieanne Manson,  M.D.

## 2016-11-20 NOTE — Telephone Encounter (Signed)
Gave patient avs report and appointments for February.  °

## 2016-11-24 ENCOUNTER — Other Ambulatory Visit: Payer: Self-pay | Admitting: General Surgery

## 2016-11-24 NOTE — H&P (Signed)
History of Present Illness Leighton Ruff MD; Q000111Q 10:18 AM) The patient is a 76 year old female who presents with a liver mass. Patient is a 76 year old female who underwent a laparoscopic-assisted sigmoid colectomy in September 2016 by Dr. Zella Richer. She had no evidence of metastatic disease at the time of diagnosis. She started having a rising CEA in February 2017 which continued to rise. CTs performed in July show a new lesion in the left liver a left renal mass, and a new lesion in the right posterior liver. She underwent ablation of this in September 2017. She developed severe abdominal pain on chemotherapy in November 2017. A CT scan showed descending colitis. She underwent a colonoscopy which showed recurrent tumor at her anastomotic site. It does not appear that the site of colitis was directly visualized or biopsied. Since that time she has developed obstructive symptoms. She is taking MiraLAX on a daily basis.   Problem List/Past Medical Leighton Ruff, MD; Q000111Q 10:19 AM) STATUS POST PARTIAL COLECTOMY (Z90.49) ENCOUNTER FOR REMOVAL OF STAPLES (Z48.02) LOCAL RECURRENCE OF COLON CANCER (C18.9) MALIGNANT NEOPLASM OF SIGMOID COLON (C18.7)  Past Surgical History Leighton Ruff, MD; Q000111Q 10:19 AM) Hemorrhoidectomy Hysterectomy (not due to cancer) - Complete Knee Surgery Left. Thyroid Surgery  Diagnostic Studies History Leighton Ruff, MD; Q000111Q 10:19 AM) Colonoscopy within last year Mammogram within last year  Allergies Malachy Moan, RMA; 11/24/2016 9:46 AM) Nelwyn [place] *ANALGESICS - NonNarcotic*  Medication History Leighton Ruff, MD; Q000111Q 10:19 AM) Hydrocodone-Acetaminophen (5-325MG  Tablet, Oral daily) Active. Prochlorperazine Maleate (5MG  Tablet, Oral daily) Active. Capecitabine (500MG  Tablet, Oral daily) Active. Aspirin-Acetaminophen-Caffeine (250-250-65MG  Tablet, Oral as needed) Active. Diphenhydramine-Acetaminophen (25-500MG   Tablet, Oral daily) Active. Ferrous Sulfate (324MG  Tablet DR, Oral) Active. Levothyroxine Sodium (137MCG Capsule, Oral daily) Active. Polyethylene Glycol 1000 (daily) Active. TraMADol HCl (50MG  Tablet, Oral daily) Active. Levothyroxine Sodium (137MCG Tablet, Oral) Active. Alendronate Sodium (70MG  Tablet, Oral) Active. Ferrous Sulfate (325 (65 Fe)MG Tablet, Oral three times daily) Active. Medications Reconciled Neomycin Sulfate (500MG  Tablet, 2 (two) Tablet Oral SEE NOTE, Taken starting 11/24/2016) Active. (TAKE TWO TABLETS AT 2 PM, 3 PM, AND 10 PM THE DAY PRIOR TO SURGERY) Flagyl (500MG  Tablet, 2 (two) Tablet Oral SEE NOTE, Taken starting 11/24/2016) Active. (Take at 2pm, 3pm, and 10pm the day prior to your colon operation)  Social History Leighton Ruff, MD; Q000111Q 10:19 AM) Caffeine use Carbonated beverages, Coffee, Tea. No alcohol use No drug use Tobacco use Never smoker.  Family History Leighton Ruff, MD; Q000111Q 10:19 AM) Arthritis Mother. Breast Cancer Sister. Diabetes Mellitus Mother. Heart Disease Mother. Heart disease in female family member before age 30 Malignant Neoplasm Of Pancreas Mother. Migraine Headache Daughter. Thyroid problems Daughter.  Pregnancy / Birth History Leighton Ruff, MD; Q000111Q 10:19 AM) Durenda Age 5 Maternal age 22-25 Para 30  Other Problems Leighton Ruff, MD; Q000111Q 10:19 AM) Arthritis Migraine Headache Thyroid Disease     Review of Systems Leighton Ruff MD; Q000111Q 10:19 AM) General Present- Fatigue, Night Sweats and Weight Loss. Not Present- Appetite Loss, Chills, Fever and Weight Gain. Skin Not Present- Change in Wart/Mole, Dryness, Hives, Jaundice, New Lesions, Non-Healing Wounds, Rash and Ulcer. HEENT Present- Seasonal Allergies. Not Present- Earache, Hearing Loss, Hoarseness, Nose Bleed, Oral Ulcers, Ringing in the Ears, Sinus Pain, Sore Throat, Visual Disturbances, Wears glasses/contact lenses and  Yellow Eyes. Respiratory Not Present- Bloody sputum, Chronic Cough, Difficulty Breathing, Snoring and Wheezing. Breast Not Present- Breast Mass, Breast Pain, Nipple Discharge and Skin Changes. Cardiovascular Present- Leg Cramps. Not Present- Chest Pain,  Difficulty Breathing Lying Down, Palpitations, Rapid Heart Rate, Shortness of Breath and Swelling of Extremities. Gastrointestinal Present- Abdominal Pain, Bloody Stool, Chronic diarrhea, Nausea and Vomiting. Not Present- Bloating, Change in Bowel Habits, Constipation, Difficulty Swallowing, Excessive gas, Gets full quickly at meals, Hemorrhoids, Indigestion and Rectal Pain. Musculoskeletal Present- Joint Stiffness. Not Present- Back Pain, Joint Pain, Muscle Pain, Muscle Weakness and Swelling of Extremities. Neurological Present- Headaches. Not Present- Decreased Memory, Fainting, Numbness, Seizures, Tingling, Tremor, Trouble walking and Weakness. Psychiatric Not Present- Anxiety, Bipolar, Change in Sleep Pattern, Depression, Fearful and Frequent crying. Hematology Present- Easy Bruising. Not Present- Excessive bleeding, Gland problems, HIV and Persistent Infections. All other systems negative  Vitals Malachy Moan RMA; 11/24/2016 9:52 AM) 11/24/2016 9:52 AM Weight: 176.4 lb Height: 67in Body Surface Area: 1.92 m Body Mass Index: 27.63 kg/m  Temp.: 96.62F  Pulse: 70 (Regular)  BP: 120/80 (Sitting, Left Arm, Standard)      Physical Exam Leighton Ruff MD; Q000111Q 10:20 AM)  The physical exam findings are as follows: Note:General: WDWN elderly female in NAD. Pleasant and cooperative.  HEENT: [place]/AT, no facial masses  EYES: EOMI, no icterus  NECK: Supple, no obvious mass or thyroid enlargement.  CV: RRR.  CHEST: Breath sounds equal and clear. Respirations nonlabored.  ABDOMEN: Soft, nontender, nondistended, no masses, no organomegaly, active bowel sounds, midline scar with no hernias  MUSCULOSKELETAL: FROM,  good muscle tone, no edema, no venous stasis changes  SKIN: No jaundice.  NEUROLOGIC: Alert and oriented, answers questions appropriately, normal gait and station.  PSYCHIATRIC: Normal mood, affect , and behavior.    Assessment & Plan Leighton Ruff MD; Q000111Q 10:16 AM)  COLON CANCER METASTASIZED TO LIVER (C18.9)  LOCAL RECURRENCE OF COLON CANCER (C18.9) Impression: 76 year old female with staple and recurrence of colon cancer. She is status post a laparoscopic-assisted sigmoid colectomy in September 2016. She developed severe abdominal pain was seen in the emergency department in November. CT scan showed some descending colon colitis. She underwent a colonoscopy which showed a recurrent mass at the staple line. She is status post liver ablation procedure for metastatic disease in September 2017. I am concerned that her CT findings are possibly ischemic colitis. There does appear to be a good portion of colon below this that appears to be without compromise. I have recommended a robotic low anterior resection with resection of the anastomosis. We will then use firefly to determine a portion of viable colon for her anastomosis. She understands that this may require resection of her entire descending colon and performing a transverse to rectum anastomosis. The surgery and anatomy were described to the patient as well as the risks of surgery and the possible complications. These include: Bleeding, deep abdominal infections and possible wound complications such as hernia and infection, damage to adjacent structures, leak of surgical connections, which can lead to other surgeries and possibly an ostomy, possible need for other procedures, such as abscess drains in radiology, possible prolonged hospital stay, possible diarrhea from removal of part of the colon, possible constipation from narcotics, possible bowel, bladder or sexual dysfunction if having rectal surgery, prolonged fatigue/weakness or  appetite loss, possible early recurrence of of disease, possible complications of their medical problems such as heart disease or arrhythmias or lung problems, death (less than 1%). I believe the patient understands and wishes to proceed with the surgery.

## 2016-12-01 ENCOUNTER — Telehealth: Payer: Self-pay

## 2016-12-01 NOTE — Telephone Encounter (Signed)
Pt called to let Dr Benay Spice know her surgery is Feb 23. She has an appt with Dr Benay Spice on Feb 5th. She believes Dr Benay Spice wants to wait until after surgery to see her. Please call pt when appt rescheduled.

## 2016-12-02 NOTE — Telephone Encounter (Signed)
Dr. Benay Spice made aware of surgery date, message sent to schedulers for office visit ~2 weeks after surgery.

## 2016-12-22 ENCOUNTER — Ambulatory Visit: Payer: Medicare Other | Admitting: Oncology

## 2016-12-28 ENCOUNTER — Encounter (HOSPITAL_COMMUNITY): Payer: Self-pay | Admitting: *Deleted

## 2016-12-28 ENCOUNTER — Emergency Department (HOSPITAL_COMMUNITY): Payer: Medicare Other

## 2016-12-28 ENCOUNTER — Emergency Department (HOSPITAL_COMMUNITY)
Admission: EM | Admit: 2016-12-28 | Discharge: 2016-12-28 | Disposition: A | Discharge status: Home / Self Care | Payer: Medicare Other | Attending: Emergency Medicine | Admitting: Emergency Medicine

## 2016-12-28 DIAGNOSIS — Z85038 Personal history of other malignant neoplasm of large intestine: Secondary | ICD-10-CM | POA: Diagnosis not present

## 2016-12-28 DIAGNOSIS — S0083XA Contusion of other part of head, initial encounter: Secondary | ICD-10-CM

## 2016-12-28 DIAGNOSIS — Z7982 Long term (current) use of aspirin: Secondary | ICD-10-CM | POA: Diagnosis not present

## 2016-12-28 DIAGNOSIS — Z79899 Other long term (current) drug therapy: Secondary | ICD-10-CM | POA: Diagnosis not present

## 2016-12-28 DIAGNOSIS — Y929 Unspecified place or not applicable: Secondary | ICD-10-CM | POA: Insufficient documentation

## 2016-12-28 DIAGNOSIS — Z96652 Presence of left artificial knee joint: Secondary | ICD-10-CM | POA: Insufficient documentation

## 2016-12-28 DIAGNOSIS — Y9301 Activity, walking, marching and hiking: Secondary | ICD-10-CM | POA: Insufficient documentation

## 2016-12-28 DIAGNOSIS — R51 Headache: Secondary | ICD-10-CM | POA: Insufficient documentation

## 2016-12-28 DIAGNOSIS — S161XXA Strain of muscle, fascia and tendon at neck level, initial encounter: Secondary | ICD-10-CM | POA: Diagnosis not present

## 2016-12-28 DIAGNOSIS — S199XXA Unspecified injury of neck, initial encounter: Secondary | ICD-10-CM | POA: Diagnosis present

## 2016-12-28 DIAGNOSIS — E039 Hypothyroidism, unspecified: Secondary | ICD-10-CM | POA: Diagnosis not present

## 2016-12-28 DIAGNOSIS — W0110XA Fall on same level from slipping, tripping and stumbling with subsequent striking against unspecified object, initial encounter: Secondary | ICD-10-CM | POA: Insufficient documentation

## 2016-12-28 DIAGNOSIS — W19XXXA Unspecified fall, initial encounter: Secondary | ICD-10-CM

## 2016-12-28 DIAGNOSIS — Y999 Unspecified external cause status: Secondary | ICD-10-CM | POA: Insufficient documentation

## 2016-12-28 MED ORDER — ONDANSETRON 4 MG PO TBDP
4.0000 mg | ORAL_TABLET | Freq: Once | ORAL | Status: AC
  Administered 2016-12-28: 4 mg via ORAL
  Filled 2016-12-28: qty 1

## 2016-12-28 MED ORDER — OXYCODONE-ACETAMINOPHEN 5-325 MG PO TABS
1.0000 | ORAL_TABLET | Freq: Once | ORAL | Status: AC
  Administered 2016-12-28: 1 via ORAL
  Filled 2016-12-28: qty 1

## 2016-12-28 NOTE — ED Provider Notes (Signed)
[facility] DEPT Provider Note   CSN: [id number] Arrival date & time: 12/28/16  1919  By signing my name below, I, Reola Mosher, attest that this documentation has been prepared under the direction and in the presence of Virgel Manifold, MD. Electronically Signed: Reola Mosher, ED Scribe. 12/28/16. 8:15 PM.  History   Chief Complaint Chief Complaint  Patient presents with  . Fall   The history is provided by the patient. No language interpreter was used.   HPI Comments: Nichole Cross is a 76 y.o. female with a h/o colon cancer, who presents to the Emergency Department complaining of sudden onset, gradually worsening, posterior head pain and neck pain s/p ground-level, mechanical fall which occurred approximately seven hours ago at 1PM. Pt reports that she was walking her dog tonight when she got tripped over the dog's leash causing her to fall backwards and strike her head on the ground below her. No LOC. She was wearing her glasses during her fall, which pushed into her left lateral eye region, sustaining a wound to the area. Bleeding has subsided and there was no glass shattering. She notes associated nausea since her fall. Pt is not currently on anticoagulant or antiplatelet therapy. She has been ambulatory since her fall with only mild soreness throughout but without difficulty. She denies numbness/tingling, visual disturbance, gait abnormality, or any other associated symptoms.   Past Medical History:  Diagnosis Date  . Anemia   . Arthritis    knees  . Blood dyscrasia 2016   microcytic anemia, likely iron deficiency anemia secondary to colon cancer  . Cataracts, bilateral   . Colon cancer ([place]) 06/2015   tubular villous adenoma on sigmoid colon specimen (08/09/15), myltiple tubular adenomas removed on a colonoscopy  11/16/15  . Complication of anesthesia   . Headache(784.0)    migraines  . Hyperthyroidism   . PONV (postoperative nausea and vomiting)    NAUSEA  . Renal carcinoma ([place]) 06/2016   ? primary  . Secondary liver cancer ([place]) 06/2016   ? metastasis from colon   Patient Active Problem List   Diagnosis Date Noted  . Adenocarcinoma of colon metastatic to liver ([place]) 07/25/2016  . Osteoporosis 06/07/2016  . Anemia, iron deficiency 06/07/2016  . Sigmoid colon cancer s/p lap assisted sigmoid colectomy 08/09/15 08/09/2015  . Overweight (BMI 25.0-29.9) 09/10/2012  . S/P left UKR 09/09/2012  . ELEVATED BLOOD PRESSURE WITHOUT DIAGNOSIS OF HYPERTENSION 09/30/2010  . GANGLION OF TENDON SHEATH 12/20/2007  . FATTY LIVER DISEASE 08/13/2007  . Hypothyroidism 08/02/2007  . CYSTITIS 08/02/2007  . Osteoarthritis 08/02/2007   Past Surgical History:  Procedure Laterality Date  . ABDOMINAL HYSTERECTOMY  1993   with bilateral BSO  . abdominal tumor  1993  . IR GENERIC HISTORICAL  07/01/2016   IR RADIOLOGIST EVAL & MGMT 07/01/2016 Greggory Keen, MD [facility] INTERV RAD  . IR GENERIC HISTORICAL  08/14/2016   IR RADIOLOGIST EVAL & MGMT 08/14/2016 Jacqulynn Cadet, MD [facility] INTERV RAD  . LAPAROSCOPIC PARTIAL COLECTOMY N/A 08/09/2015   Procedure: LAPAROSCOPIC PARTIAL COLECTOMY;  Surgeon: Jackolyn Confer, MD;  Location: [facility] ORS;  Service: General;  Laterality: N/A;  . PARTIAL KNEE ARTHROPLASTY  09/09/2012   Procedure: UNICOMPARTMENTAL KNEE;  Surgeon: Mauri Pole, MD;  Location: [facility] ORS;  Service: Orthopedics;  Laterality: Left;   OB History    No data available     Home Medications    Prior to Admission medications   Medication Sig Start Date End Date Taking? Authorizing  Provider  aspirin-acetaminophen-caffeine (EXCEDRIN MIGRAINE) [id number] MG tablet Take 1 tablet by mouth every 6 (six) hours as needed for headache.    Historical Provider, MD  capecitabine (XELODA) 500 MG tablet Take 2 tablets (1,000 mg total) by mouth 2 (two) times daily after a meal. For 14 days then 7 days off. 10/31/16   Ladell Pier, MD  diphenhydramine-acetaminophen  (TYLENOL PM) 25-500 MG TABS tablet Take 1 tablet by mouth at bedtime as needed.    Historical Provider, MD  ferrous sulfate 325 (65 FE) MG tablet Take 1 tablet (325 mg total) by mouth 3 (three) times daily after meals. Patient taking differently: Take 325 mg by mouth 2 (two) times daily with a meal.  09/10/12   Danae Orleans, PA-C  HYDROcodone-acetaminophen (NORCO) 5-325 MG tablet Take 1 tablet by mouth every 8 (eight) hours as needed for moderate pain. 10/02/16   Owens Shark, NP  levothyroxine (SYNTHROID, LEVOTHROID) 137 MCG tablet Take 1 tablet (137 mcg total) by mouth daily before breakfast. 07/16/16   [name], MD  polyethylene glycol (MIRALAX / GLYCOLAX) packet Take 17 g by mouth daily as needed for mild constipation. Reported on 06/05/2016    Historical Provider, MD  prochlorperazine (COMPAZINE) 5 MG tablet Take 1 tablet (5 mg total) by mouth every 6 (six) hours as needed for nausea or vomiting. 08/14/16   Ladell Pier, MD  traMADol (ULTRAM) 50 MG tablet Take 1 tablet (50 mg total) by mouth every 6 (six) hours as needed for moderate pain. 10/31/16   Ladell Pier, MD   Family History Family History  Problem Relation Age of Onset  . Pancreatic cancer Mother   . Parkinson's disease Mother   . Breast cancer Sister   . Parkinson's disease Brother   . Parkinson's disease Brother   . Colon cancer Neg Hx    Social History Social History  Substance Use Topics  . Smoking status: Never Smoker  . Smokeless tobacco: Never Used  . Alcohol use No   Allergies   Morphine and related and Aspirin  Review of Systems Review of Systems  Eyes: Negative for visual disturbance.  Gastrointestinal: Positive for nausea. Negative for vomiting.  Musculoskeletal: Positive for myalgias and neck pain. Negative for gait problem.  Skin: Positive for wound.  Neurological: Negative for syncope and numbness.       Negative for tingling.   All other systems reviewed and are negative.  Physical  Exam Updated Vital Signs BP 184/91   Pulse 72   Temp 97.9 F (36.6 C) (Oral)   Resp 18   Ht 5\' 7"  (1.702 m)   Wt 176 lb (79.8 kg)   SpO2 98%   BMI 27.57 kg/m   Physical Exam  Constitutional: She is oriented to person, place, and time. She appears well-developed and well-nourished. Cervical collar in place.  HENT:  Head: Normocephalic.  Right Ear: External ear normal.  Left Ear: External ear normal.  Nose: Nose normal.  Mouth/Throat: Oropharynx is clear and moist.  Small abrasion just lateral to the left eye.   Eyes: Conjunctivae are normal. Right eye exhibits no discharge. Left eye exhibits no discharge.  Neck:  C-collar in place. Mild mid to lower c-spine tenderness.   Cardiovascular: Normal rate, regular rhythm and normal heart sounds.   No murmur heard. Pulmonary/Chest: Effort normal and breath sounds normal. No respiratory distress. She has no wheezes. She has no rales.  Abdominal: Soft. She exhibits no distension. There is no tenderness.  There is no rebound and no guarding.  Musculoskeletal: Normal range of motion. She exhibits tenderness. She exhibits no edema.  Neurological: She is alert and oriented to person, place, and time. She displays normal reflexes. No cranial nerve deficit. Coordination normal.  Skin: Skin is warm and dry. No rash noted. No erythema. No pallor.  Psychiatric: She has a normal mood and affect. Her behavior is normal.  Nursing note and vitals reviewed.  ED Treatments / Results  DIAGNOSTIC STUDIES: Oxygen Saturation is 98% on RA, normal by my interpretation.  COORDINATION OF CARE: 8:15 PM-Discussed next steps with pt. Pt verbalized understanding and is agreeable with the plan.   Labs (all labs ordered are listed, but only abnormal results are displayed) Labs Reviewed - No data to display  EKG  EKG Interpretation None      Radiology No results found.   Ct Head Wo Contrast  Result Date: 12/28/2016 CLINICAL DATA:  Pain after fall  EXAM: CT HEAD WITHOUT CONTRAST CT CERVICAL SPINE WITHOUT CONTRAST TECHNIQUE: Multidetector CT imaging of the head and cervical spine was performed following the standard protocol without intravenous contrast. Multiplanar CT image reconstructions of the cervical spine were also generated. COMPARISON:  None. FINDINGS: CT HEAD FINDINGS BRAIN: There is sulcal and ventricular prominence consistent with superficial and central atrophy. No intraparenchymal hemorrhage, mass effect nor midline shift. Periventricular and subcortical white matter hypodensities consistent with chronic small vessel ischemic disease are identified. No acute large vascular territory infarcts. No abnormal extra-axial fluid collections. Basal cisterns are not effaced and midline. VASCULAR: Moderate calcific atherosclerosis of the carotid siphons. SKULL: No skull fracture. No significant scalp soft tissue swelling. SINUSES/ORBITS: The mastoid air-cells are clear. Mild right maxillary and frontal sinus sinus mucosal thickening. The sphenoid, ethmoid and left maxillary sinuses appear clear.The included ocular globes and orbital contents are non-suspicious. OTHER: None. CT CERVICAL SPINE FINDINGS Alignment: Slight straightening of cervical lordosis with mild levoconvex curvature of the lumbar spine. Skull base and vertebrae: No acute vertebral body fracture or bone destruction. The craniocervical relationship and atlantodental interval is maintained. Soft tissues and spinal canal: No prevertebral soft tissue swelling nor intraspinal hemorrhage. Disc levels: Disc space narrowing and C5-6 with small posterior marginal osteophytes. No focal disc herniation. Uncovertebral joint osteoarthritis at C5-6 bilaterally. No significant neural foraminal encroachment. Upper chest: Negative Other: None IMPRESSION: No acute intracranial nor cervical spinal abnormality. Mild cerebral atrophy with chronic appearing small vessel ischemic disease of periventricular white  matter. No extra-axial fluid collections nor acute hemorrhage. Degenerative disc disease C5-6 with small posterior marginal osteophytes and uncovertebral joint osteoarthritis bilaterally. O Electronically Signed   By: Ashley Royalty M.D.   On: 12/28/2016 21:21   Ct Cervical Spine Wo Contrast  Result Date: 12/28/2016 CLINICAL DATA:  Pain after fall EXAM: CT HEAD WITHOUT CONTRAST CT CERVICAL SPINE WITHOUT CONTRAST TECHNIQUE: Multidetector CT imaging of the head and cervical spine was performed following the standard protocol without intravenous contrast. Multiplanar CT image reconstructions of the cervical spine were also generated. COMPARISON:  None. FINDINGS: CT HEAD FINDINGS BRAIN: There is sulcal and ventricular prominence consistent with superficial and central atrophy. No intraparenchymal hemorrhage, mass effect nor midline shift. Periventricular and subcortical white matter hypodensities consistent with chronic small vessel ischemic disease are identified. No acute large vascular territory infarcts. No abnormal extra-axial fluid collections. Basal cisterns are not effaced and midline. VASCULAR: Moderate calcific atherosclerosis of the carotid siphons. SKULL: No skull fracture. No significant scalp soft tissue swelling. SINUSES/ORBITS: The mastoid air-cells  are clear. Mild right maxillary and frontal sinus sinus mucosal thickening. The sphenoid, ethmoid and left maxillary sinuses appear clear.The included ocular globes and orbital contents are non-suspicious. OTHER: None. CT CERVICAL SPINE FINDINGS Alignment: Slight straightening of cervical lordosis with mild levoconvex curvature of the lumbar spine. Skull base and vertebrae: No acute vertebral body fracture or bone destruction. The craniocervical relationship and atlantodental interval is maintained. Soft tissues and spinal canal: No prevertebral soft tissue swelling nor intraspinal hemorrhage. Disc levels: Disc space narrowing and C5-6 with small posterior  marginal osteophytes. No focal disc herniation. Uncovertebral joint osteoarthritis at C5-6 bilaterally. No significant neural foraminal encroachment. Upper chest: Negative Other: None IMPRESSION: No acute intracranial nor cervical spinal abnormality. Mild cerebral atrophy with chronic appearing small vessel ischemic disease of periventricular white matter. No extra-axial fluid collections nor acute hemorrhage. Degenerative disc disease C5-6 with small posterior marginal osteophytes and uncovertebral joint osteoarthritis bilaterally. O Electronically Signed   By: Ashley Royalty M.D.   On: 12/28/2016 21:21    Procedures Procedures   Medications Ordered in ED Medications - No data to display  Initial Impression / Assessment and Plan / ED Course  I have reviewed the triage vital signs and the nursing notes.  Pertinent labs & imaging results that were available during my care of the patient were reviewed by me and considered in my medical decision making (see chart for details).     Mechanical fall. Nonfocal neurological examination. Reassuring imaging.  Final Clinical Impressions(s) / ED Diagnoses   Final diagnoses:  Fall, initial encounter  Contusion of face, initial encounter  Strain of neck muscle, initial encounter   New Prescriptions New Prescriptions   No medications on file   I personally preformed the services scribed in my presence. The recorded information has been reviewed is accurate. Virgel Manifold, MD.     Virgel Manifold, MD 01/11/17 2106

## 2016-12-28 NOTE — ED Triage Notes (Addendum)
Pt reports her dogs leash got tangled up and she fell about 1pm. Pt c/o pain in the neck, back of head. Abrasion to the left eye. Ccollar placed in triage. No loc. Nausea since fall.

## 2016-12-31 NOTE — Patient Instructions (Signed)
Nichole Cross  12/31/2016   Your procedure is scheduled on: 01-09-17  Report to [facility]  Entrance take [facility]  elevators to 3rd floor to  [place] at 530AM.  Call this number if you have problems the morning of surgery [phone]   Remember: ONLY 1 PERSON MAY GO WITH YOU TO SHORT STAY TO GET  READY MORNING OF [address].  Do not eat food After Midnight ON Wednesday NIGHT 01-07-17. CLEAR LIQUIDS ALL DAY Thursday 01-08-17 PER DR Marcello Moores INSTRUCTIONS FOR BOWEL PREP. NOTHING BY MOUTH AFTER MIDNIGHT ON Thursday !     Take these medicines the morning of surgery with A SIP OF WATER: SYNTHROID                                You may not have any metal on your body including hair pins and              piercings  Do not wear jewelry, make-up, lotions, powders or perfumes, deodorant             Do not wear nail polish.  Do not shave  48 hours prior to surgery.              Men may shave face and neck.   Do not bring valuables to the hospital. [place].  Contacts, dentures or bridgework may not be worn into surgery.  Leave suitcase in the car. After surgery it may be brought to your room.              Please read over the following fact sheets you were given: _____________________________________________________________________     CLEAR LIQUID DIET   Foods Allowed                                                                     Foods Excluded  Coffee and tea, regular and decaf                             liquids that you cannot  Plain Jell-O in any flavor                                             see through such as: Fruit ices (not with fruit pulp)                                     milk, soups, orange juice  Iced Popsicles                                    All solid food Carbonated beverages, regular and diet  Cranberry, grape and apple juices Sports  drinks like Gatorade Lightly seasoned clear broth or consume(fat free) Sugar, honey syrup  Sample Menu Breakfast                                Lunch                                     Supper Cranberry juice                    Beef broth                            Chicken broth Jell-O                                     Grape juice                           Apple juice Coffee or tea                        Jell-O                                      Popsicle                                                Coffee or tea                        Coffee or tea  _____________________________________________________________________  [facility] - Preparing for Surgery Before surgery, you can play an important role.  Because skin is not sterile, your skin needs to be as free of germs as possible.  You can reduce the number of germs on your skin by washing with CHG (chlorahexidine gluconate) soap before surgery.  CHG is an antiseptic cleaner which kills germs and bonds with the skin to continue killing germs even after washing. Please DO NOT use if you have an allergy to CHG or antibacterial soaps.  If your skin becomes reddened/irritated stop using the CHG and inform your nurse when you arrive at Short Stay. Do not shave (including legs and underarms) for at least 48 hours prior to the first CHG shower.  You may shave your face/neck. Please follow these instructions carefully:  1.  Shower with CHG Soap the night before surgery and the  morning of Surgery.  2.  If you choose to wash your hair, wash your hair first as usual with your  normal  shampoo.  3.  After you shampoo, rinse your hair and body thoroughly to remove the  shampoo.                           4.  Use CHG as you would any other liquid soap.  You can apply chg directly  to the skin and wash  Gently with a scrungie or clean washcloth.  5.  Apply the CHG Soap to your body ONLY FROM THE NECK DOWN.   Do not use on face/  open                           Wound or open sores. Avoid contact with eyes, ears mouth and genitals (private parts).                       Wash face,  Genitals (private parts) with your normal soap.             6.  Wash thoroughly, paying special attention to the area where your surgery  will be performed.  7.  Thoroughly rinse your body with warm water from the neck down.  8.  DO NOT shower/wash with your normal soap after using and rinsing off  the CHG Soap.                9.  Pat yourself dry with a clean towel.            10.  Wear clean pajamas.            11.  Place clean sheets on your bed the night of your first shower and do not  sleep with pets. Day of Surgery : Do not apply any lotions/deodorants the morning of surgery.  Please wear clean clothes to the hospital/surgery center.  FAILURE TO FOLLOW THESE INSTRUCTIONS MAY RESULT IN THE CANCELLATION OF YOUR SURGERY PATIENT SIGNATURE_________________________________  NURSE SIGNATURE__________________________________  ________________________________________________________________________

## 2016-12-31 NOTE — Progress Notes (Signed)
LOV DR BURNS 07-14-16 EPIC CXR/EKG 07-25-16 EPIC

## 2017-01-02 ENCOUNTER — Encounter (HOSPITAL_COMMUNITY)
Admission: RE | Admit: 2017-01-02 | Discharge: 2017-01-02 | Disposition: A | Discharge status: Home / Self Care | Payer: Medicare Other | Source: Ambulatory Visit | Attending: General Surgery | Admitting: General Surgery

## 2017-01-02 ENCOUNTER — Encounter (INDEPENDENT_AMBULATORY_CARE_PROVIDER_SITE_OTHER): Payer: Self-pay

## 2017-01-02 ENCOUNTER — Encounter (HOSPITAL_COMMUNITY): Payer: Self-pay

## 2017-01-02 DIAGNOSIS — Z01818 Encounter for other preprocedural examination: Secondary | ICD-10-CM | POA: Insufficient documentation

## 2017-01-02 HISTORY — DX: Secondary malignant neoplasm of liver and intrahepatic bile duct: C78.7

## 2017-01-02 HISTORY — DX: Secondary malignant neoplasm of unspecified kidney and renal pelvis: C79.00

## 2017-01-02 HISTORY — DX: Hypothyroidism, unspecified: E03.9

## 2017-01-02 LAB — COMPREHENSIVE METABOLIC PANEL
ALBUMIN: 4.5 g/dL (ref 3.5–5.0)
ALK PHOS: 89 U/L (ref 38–126)
ALT: 13 U/L — AB (ref 14–54)
ANION GAP: 8 (ref 5–15)
AST: 28 U/L (ref 15–41)
BILIRUBIN TOTAL: 0.9 mg/dL (ref 0.3–1.2)
BUN: 16 mg/dL (ref 6–20)
CALCIUM: 9.6 mg/dL (ref 8.9–10.3)
CO2: 26 mmol/L (ref 22–32)
CREATININE: 1.23 mg/dL — AB (ref 0.44–1.00)
Chloride: 107 mmol/L (ref 101–111)
GFR calc non Af Amer: 42 mL/min — ABNORMAL LOW (ref >60)
GFR, EST AFRICAN AMERICAN: 49 mL/min — AB (ref >60)
GLUCOSE: 103 mg/dL — AB (ref 65–99)
Potassium: 5 mmol/L (ref 3.5–5.1)
SODIUM: 141 mmol/L (ref 135–145)
TOTAL PROTEIN: 8.1 g/dL (ref 6.5–8.1)

## 2017-01-02 LAB — CBC
HEMATOCRIT: 42 % (ref 36.0–46.0)
HEMOGLOBIN: 13.9 g/dL (ref 12.0–15.0)
MCH: 29.6 pg (ref 26.0–34.0)
MCHC: 33.1 g/dL (ref 30.0–36.0)
MCV: 89.6 fL (ref 78.0–100.0)
Platelets: 232 10*3/uL (ref 150–400)
RBC: 4.69 MIL/uL (ref 3.87–5.11)
RDW: 15.3 % (ref 11.5–15.5)
WBC: 3.6 10*3/uL — ABNORMAL LOW (ref 4.0–10.5)

## 2017-01-03 LAB — HEMOGLOBIN A1C
Hgb A1c MFr Bld: 5.1 % (ref 4.8–5.6)
Mean Plasma Glucose: 100 mg/dL

## 2017-01-09 ENCOUNTER — Encounter (HOSPITAL_COMMUNITY)
Admission: RE | Disposition: A | Discharge status: Home / Self Care | Payer: Self-pay | Source: Ambulatory Visit | Attending: General Surgery

## 2017-01-09 ENCOUNTER — Inpatient Hospital Stay (HOSPITAL_COMMUNITY)
Admission: RE | Admit: 2017-01-09 | Discharge: 2017-01-14 | DRG: 330 | Disposition: A | Discharge status: Home / Self Care | Payer: Medicare Other | Source: Ambulatory Visit | Attending: General Surgery | Admitting: General Surgery

## 2017-01-09 ENCOUNTER — Inpatient Hospital Stay (HOSPITAL_COMMUNITY): Payer: Medicare Other | Admitting: Certified Registered Nurse Anesthetist

## 2017-01-09 ENCOUNTER — Encounter (HOSPITAL_COMMUNITY): Payer: Self-pay

## 2017-01-09 DIAGNOSIS — K760 Fatty (change of) liver, not elsewhere classified: Secondary | ICD-10-CM | POA: Diagnosis present

## 2017-01-09 DIAGNOSIS — Z8261 Family history of arthritis: Secondary | ICD-10-CM | POA: Diagnosis not present

## 2017-01-09 DIAGNOSIS — E079 Disorder of thyroid, unspecified: Secondary | ICD-10-CM | POA: Diagnosis present

## 2017-01-09 DIAGNOSIS — Z800 Family history of malignant neoplasm of digestive organs: Secondary | ICD-10-CM | POA: Diagnosis not present

## 2017-01-09 DIAGNOSIS — C787 Secondary malignant neoplasm of liver and intrahepatic bile duct: Secondary | ICD-10-CM | POA: Diagnosis present

## 2017-01-09 DIAGNOSIS — M810 Age-related osteoporosis without current pathological fracture: Secondary | ICD-10-CM | POA: Diagnosis present

## 2017-01-09 DIAGNOSIS — Z79899 Other long term (current) drug therapy: Secondary | ICD-10-CM | POA: Diagnosis not present

## 2017-01-09 DIAGNOSIS — Z79891 Long term (current) use of opiate analgesic: Secondary | ICD-10-CM | POA: Diagnosis not present

## 2017-01-09 DIAGNOSIS — Z90710 Acquired absence of both cervix and uterus: Secondary | ICD-10-CM

## 2017-01-09 DIAGNOSIS — Z803 Family history of malignant neoplasm of breast: Secondary | ICD-10-CM

## 2017-01-09 DIAGNOSIS — D509 Iron deficiency anemia, unspecified: Secondary | ICD-10-CM | POA: Diagnosis present

## 2017-01-09 DIAGNOSIS — N736 Female pelvic peritoneal adhesions (postinfective): Secondary | ICD-10-CM | POA: Diagnosis present

## 2017-01-09 DIAGNOSIS — G43909 Migraine, unspecified, not intractable, without status migrainosus: Secondary | ICD-10-CM | POA: Diagnosis present

## 2017-01-09 DIAGNOSIS — Z833 Family history of diabetes mellitus: Secondary | ICD-10-CM

## 2017-01-09 DIAGNOSIS — D62 Acute posthemorrhagic anemia: Secondary | ICD-10-CM | POA: Diagnosis not present

## 2017-01-09 DIAGNOSIS — R112 Nausea with vomiting, unspecified: Secondary | ICD-10-CM | POA: Diagnosis not present

## 2017-01-09 DIAGNOSIS — E663 Overweight: Secondary | ICD-10-CM | POA: Diagnosis present

## 2017-01-09 DIAGNOSIS — Z6827 Body mass index (BMI) 27.0-27.9, adult: Secondary | ICD-10-CM

## 2017-01-09 DIAGNOSIS — C187 Malignant neoplasm of sigmoid colon: Secondary | ICD-10-CM | POA: Diagnosis present

## 2017-01-09 DIAGNOSIS — Z886 Allergy status to analgesic agent status: Secondary | ICD-10-CM | POA: Diagnosis not present

## 2017-01-09 DIAGNOSIS — E039 Hypothyroidism, unspecified: Secondary | ICD-10-CM | POA: Diagnosis present

## 2017-01-09 DIAGNOSIS — K559 Vascular disorder of intestine, unspecified: Secondary | ICD-10-CM | POA: Diagnosis present

## 2017-01-09 DIAGNOSIS — C20 Malignant neoplasm of rectum: Secondary | ICD-10-CM | POA: Diagnosis present

## 2017-01-09 DIAGNOSIS — Z8249 Family history of ischemic heart disease and other diseases of the circulatory system: Secondary | ICD-10-CM

## 2017-01-09 DIAGNOSIS — I959 Hypotension, unspecified: Secondary | ICD-10-CM | POA: Diagnosis not present

## 2017-01-09 HISTORY — PX: XI ROBOTIC ASSISTED LOWER ANTERIOR RESECTION: SHX6558

## 2017-01-09 LAB — TYPE AND SCREEN
ABO/RH(D): O POS
Antibody Screen: NEGATIVE

## 2017-01-09 SURGERY — RESECTION, RECTUM, LOW ANTERIOR, ROBOT-ASSISTED
Anesthesia: General

## 2017-01-09 MED ORDER — DEXAMETHASONE SODIUM PHOSPHATE 10 MG/ML IJ SOLN
INTRAMUSCULAR | Status: AC
  Filled 2017-01-09: qty 1

## 2017-01-09 MED ORDER — LACTATED RINGERS IV BOLUS (SEPSIS)
500.0000 mL | Freq: Once | INTRAVENOUS | Status: AC
Start: 2017-01-09 — End: 2017-01-09
  Administered 2017-01-09: 500 mL via INTRAVENOUS

## 2017-01-09 MED ORDER — PROPOFOL 10 MG/ML IV BOLUS
INTRAVENOUS | Status: AC
  Filled 2017-01-09: qty 20

## 2017-01-09 MED ORDER — LIDOCAINE 2% (20 MG/ML) 5 ML SYRINGE
INTRAMUSCULAR | Status: AC
  Filled 2017-01-09: qty 5

## 2017-01-09 MED ORDER — HYDROMORPHONE HCL 2 MG/ML IJ SOLN
INTRAMUSCULAR | Status: AC
  Filled 2017-01-09: qty 1

## 2017-01-09 MED ORDER — PHENYLEPHRINE 40 MCG/ML (10ML) SYRINGE FOR IV PUSH (FOR BLOOD PRESSURE SUPPORT)
PREFILLED_SYRINGE | INTRAVENOUS | Status: AC
  Filled 2017-01-09: qty 10

## 2017-01-09 MED ORDER — INDOCYANINE GREEN 25 MG IV SOLR
INTRAVENOUS | Status: DC | PRN
  Administered 2017-01-09: 2.5 mg via INTRAVENOUS
  Administered 2017-01-09: 5 mg via INTRAVENOUS

## 2017-01-09 MED ORDER — ONDANSETRON HCL 4 MG/2ML IJ SOLN
4.0000 mg | Freq: Four times a day (QID) | INTRAMUSCULAR | Status: DC | PRN
  Administered 2017-01-09 – 2017-01-13 (×6): 4 mg via INTRAVENOUS
  Filled 2017-01-09 (×6): qty 2

## 2017-01-09 MED ORDER — ALVIMOPAN 12 MG PO CAPS
12.0000 mg | ORAL_CAPSULE | Freq: Two times a day (BID) | ORAL | Status: DC
  Administered 2017-01-10: 12 mg via ORAL
  Filled 2017-01-09: qty 1

## 2017-01-09 MED ORDER — PROPOFOL 10 MG/ML IV BOLUS
INTRAVENOUS | Status: DC | PRN
  Administered 2017-01-09: 120 mg via INTRAVENOUS

## 2017-01-09 MED ORDER — MUPIROCIN 2 % EX OINT: 1.0000 "application " | TOPICAL_OINTMENT | Freq: Two times a day (BID) | CUTANEOUS | Status: DC

## 2017-01-09 MED ORDER — KCL IN DEXTROSE-NACL 20-5-0.45 MEQ/L-%-% IV SOLN
INTRAVENOUS | Status: DC
  Administered 2017-01-09 – 2017-01-12 (×4): via INTRAVENOUS
  Filled 2017-01-09 (×5): qty 1000

## 2017-01-09 MED ORDER — HEPARIN SODIUM (PORCINE) 5000 UNIT/ML IJ SOLN
5000.0000 [IU] | Freq: Once | INTRAMUSCULAR | Status: AC
  Administered 2017-01-09: 5000 [IU] via SUBCUTANEOUS
  Filled 2017-01-09: qty 1

## 2017-01-09 MED ORDER — SACCHAROMYCES BOULARDII 250 MG PO CAPS
250.0000 mg | ORAL_CAPSULE | Freq: Two times a day (BID) | ORAL | Status: DC
  Administered 2017-01-09 – 2017-01-14 (×10): 250 mg via ORAL
  Filled 2017-01-09 (×10): qty 1

## 2017-01-09 MED ORDER — CHLORHEXIDINE GLUCONATE CLOTH 2 % EX PADS: 6.0000 | MEDICATED_PAD | Freq: Every day | CUTANEOUS | Status: DC

## 2017-01-09 MED ORDER — BUPIVACAINE HCL (PF) 0.25 % IJ SOLN
INTRAMUSCULAR | Status: DC | PRN
  Administered 2017-01-09: 30 mL

## 2017-01-09 MED ORDER — LABETALOL HCL 5 MG/ML IV SOLN
INTRAVENOUS | Status: AC
  Filled 2017-01-09: qty 4

## 2017-01-09 MED ORDER — EPHEDRINE 5 MG/ML INJ
INTRAVENOUS | Status: AC
  Filled 2017-01-09: qty 10

## 2017-01-09 MED ORDER — ACETAMINOPHEN 500 MG PO TABS
1000.0000 mg | ORAL_TABLET | Freq: Four times a day (QID) | ORAL | Status: AC
  Administered 2017-01-09 – 2017-01-12 (×11): 1000 mg via ORAL
  Filled 2017-01-09 (×12): qty 2

## 2017-01-09 MED ORDER — HYDROMORPHONE HCL 1 MG/ML IJ SOLN
0.5000 mg | INTRAMUSCULAR | Status: DC | PRN
  Administered 2017-01-09 – 2017-01-10 (×5): 0.5 mg via INTRAVENOUS
  Filled 2017-01-09 (×5): qty 0.5

## 2017-01-09 MED ORDER — HYDROMORPHONE HCL 1 MG/ML IJ SOLN
INTRAMUSCULAR | Status: DC | PRN
  Administered 2017-01-09: .8 mg via INTRAVENOUS
  Administered 2017-01-09: .2 mg via INTRAVENOUS
  Administered 2017-01-09: .6 mg via INTRAVENOUS
  Administered 2017-01-09 (×2): .2 mg via INTRAVENOUS

## 2017-01-09 MED ORDER — ROCURONIUM BROMIDE 50 MG/5ML IV SOSY
PREFILLED_SYRINGE | INTRAVENOUS | Status: DC | PRN
  Administered 2017-01-09: 20 mg via INTRAVENOUS
  Administered 2017-01-09: 10 mg via INTRAVENOUS
  Administered 2017-01-09: 20 mg via INTRAVENOUS
  Administered 2017-01-09: 50 mg via INTRAVENOUS
  Administered 2017-01-09: 10 mg via INTRAVENOUS

## 2017-01-09 MED ORDER — HYDROMORPHONE HCL 1 MG/ML IJ SOLN
0.2500 mg | INTRAMUSCULAR | Status: DC | PRN
  Administered 2017-01-09: 0.5 mg via INTRAVENOUS

## 2017-01-09 MED ORDER — ROCURONIUM BROMIDE 50 MG/5ML IV SOSY
PREFILLED_SYRINGE | INTRAVENOUS | Status: AC
  Filled 2017-01-09: qty 5

## 2017-01-09 MED ORDER — DEXAMETHASONE SODIUM PHOSPHATE 4 MG/ML IJ SOLN
INTRAMUSCULAR | Status: DC | PRN
  Administered 2017-01-09: 10 mg via INTRAVENOUS

## 2017-01-09 MED ORDER — KETAMINE HCL 10 MG/ML IJ SOLN
INTRAMUSCULAR | Status: DC | PRN
  Administered 2017-01-09: 2.5 mg via INTRAVENOUS
  Administered 2017-01-09: 30 mg via INTRAVENOUS
  Administered 2017-01-09 (×8): 2.5 mg via INTRAVENOUS

## 2017-01-09 MED ORDER — ALUM & MAG HYDROXIDE-SIMETH 200-200-20 MG/5ML PO SUSP
30.0000 mL | Freq: Four times a day (QID) | ORAL | Status: DC | PRN
Start: 2017-01-09 — End: 2017-01-14

## 2017-01-09 MED ORDER — LACTATED RINGERS IV SOLN
INTRAVENOUS | Status: DC | PRN
  Administered 2017-01-09: 10:00:00 via INTRAVENOUS
  Administered 2017-01-09: 15:00:00
  Administered 2017-01-09 (×2): via INTRAVENOUS

## 2017-01-09 MED ORDER — DIPHENHYDRAMINE HCL 50 MG/ML IJ SOLN
12.5000 mg | Freq: Four times a day (QID) | INTRAMUSCULAR | Status: DC | PRN
  Administered 2017-01-10 (×2): 12.5 mg via INTRAVENOUS
  Filled 2017-01-09 (×2): qty 1

## 2017-01-09 MED ORDER — ONDANSETRON HCL 4 MG/2ML IJ SOLN
INTRAMUSCULAR | Status: AC
  Filled 2017-01-09: qty 2

## 2017-01-09 MED ORDER — ALBUMIN HUMAN 5 % IV SOLN
12.5000 g | Freq: Once | INTRAVENOUS | Status: AC
Start: 2017-01-09 — End: 2017-01-09
  Administered 2017-01-09: 12.5 g via INTRAVENOUS

## 2017-01-09 MED ORDER — EPHEDRINE SULFATE-NACL 50-0.9 MG/10ML-% IV SOSY
PREFILLED_SYRINGE | INTRAVENOUS | Status: DC | PRN
  Administered 2017-01-09 (×2): 10 mg via INTRAVENOUS
  Administered 2017-01-09 (×2): 5 mg via INTRAVENOUS
  Administered 2017-01-09: 10 mg via INTRAVENOUS
  Administered 2017-01-09: 5 mg via INTRAVENOUS
  Administered 2017-01-09: 15 mg via INTRAVENOUS
  Administered 2017-01-09 (×2): 5 mg via INTRAVENOUS
  Administered 2017-01-09: 10 mg via INTRAVENOUS

## 2017-01-09 MED ORDER — BUPIVACAINE HCL (PF) 0.25 % IJ SOLN
INTRAMUSCULAR | Status: AC
  Filled 2017-01-09: qty 30

## 2017-01-09 MED ORDER — BUPIVACAINE HCL (PF) 0.25 % IJ SOLN
INTRAMUSCULAR | Status: DC | PRN
  Administered 2017-01-09: 20 mL

## 2017-01-09 MED ORDER — 0.9 % SODIUM CHLORIDE (POUR BTL) OPTIME
TOPICAL | Status: DC | PRN
  Administered 2017-01-09: 3000 mL

## 2017-01-09 MED ORDER — GABAPENTIN 300 MG PO CAPS
300.0000 mg | ORAL_CAPSULE | ORAL | Status: AC
  Administered 2017-01-09: 300 mg via ORAL
  Filled 2017-01-09: qty 1

## 2017-01-09 MED ORDER — ENOXAPARIN SODIUM 40 MG/0.4ML ~~LOC~~ SOLN
40.0000 mg | SUBCUTANEOUS | Status: DC
  Administered 2017-01-10 – 2017-01-14 (×5): 40 mg via SUBCUTANEOUS
  Filled 2017-01-09 (×5): qty 0.4

## 2017-01-09 MED ORDER — BUPIVACAINE LIPOSOME 1.3 % IJ SUSP
20.0000 mL | Freq: Once | INTRAMUSCULAR | Status: DC
  Filled 2017-01-09: qty 20

## 2017-01-09 MED ORDER — SUGAMMADEX SODIUM 200 MG/2ML IV SOLN
INTRAVENOUS | Status: DC | PRN
  Administered 2017-01-09: 200 mg via INTRAVENOUS
  Administered 2017-01-09: 300 mg via INTRAVENOUS

## 2017-01-09 MED ORDER — ONDANSETRON HCL 4 MG/2ML IJ SOLN
INTRAMUSCULAR | Status: DC | PRN
Start: 2017-01-09 — End: 2017-01-09
  Administered 2017-01-09: 4 mg via INTRAVENOUS

## 2017-01-09 MED ORDER — ALBUMIN HUMAN 5 % IV SOLN
INTRAVENOUS | Status: AC
  Filled 2017-01-09: qty 250

## 2017-01-09 MED ORDER — FENTANYL CITRATE (PF) 100 MCG/2ML IJ SOLN
INTRAMUSCULAR | Status: AC
  Filled 2017-01-09: qty 2

## 2017-01-09 MED ORDER — LIDOCAINE 2% (20 MG/ML) 5 ML SYRINGE
INTRAMUSCULAR | Status: DC | PRN
  Administered 2017-01-09: 1.5 mg/kg/h via INTRAVENOUS

## 2017-01-09 MED ORDER — FENTANYL CITRATE (PF) 100 MCG/2ML IJ SOLN
INTRAMUSCULAR | Status: DC | PRN
  Administered 2017-01-09 (×2): 50 ug via INTRAVENOUS

## 2017-01-09 MED ORDER — LACTATED RINGERS IR SOLN
Status: DC | PRN
  Administered 2017-01-09: 3000

## 2017-01-09 MED ORDER — LABETALOL HCL 5 MG/ML IV SOLN
INTRAVENOUS | Status: DC | PRN
  Administered 2017-01-09: 5 mg via INTRAVENOUS

## 2017-01-09 MED ORDER — ONDANSETRON HCL 4 MG PO TABS
4.0000 mg | ORAL_TABLET | Freq: Four times a day (QID) | ORAL | Status: DC | PRN
Start: 2017-01-09 — End: 2017-01-14
  Administered 2017-01-12: 4 mg via ORAL
  Filled 2017-01-09: qty 1

## 2017-01-09 MED ORDER — HYDROMORPHONE HCL 1 MG/ML IJ SOLN
INTRAMUSCULAR | Status: AC
  Filled 2017-01-09: qty 1

## 2017-01-09 MED ORDER — ACETAMINOPHEN 500 MG PO TABS
1000.0000 mg | ORAL_TABLET | ORAL | Status: AC
  Administered 2017-01-09: 1000 mg via ORAL
  Filled 2017-01-09: qty 2

## 2017-01-09 MED ORDER — LEVOTHYROXINE SODIUM 25 MCG PO TABS
137.0000 ug | ORAL_TABLET | Freq: Every day | ORAL | Status: DC
  Administered 2017-01-10 – 2017-01-14 (×5): 137 ug via ORAL
  Filled 2017-01-09 (×5): qty 1

## 2017-01-09 MED ORDER — SODIUM CHLORIDE 0.9 % IJ SOLN
INTRAMUSCULAR | Status: AC
  Filled 2017-01-09: qty 10

## 2017-01-09 MED ORDER — PROMETHAZINE HCL 25 MG/ML IJ SOLN: 6.2500 mg | INTRAMUSCULAR | Status: DC | PRN

## 2017-01-09 MED ORDER — KETAMINE HCL 10 MG/ML IJ SOLN
INTRAMUSCULAR | Status: AC
  Filled 2017-01-09: qty 1

## 2017-01-09 MED ORDER — ALBUMIN HUMAN 25 % IV SOLN
12.5000 g | Freq: Once | INTRAVENOUS | Status: DC
  Filled 2017-01-09: qty 50

## 2017-01-09 MED ORDER — PHENYLEPHRINE 40 MCG/ML (10ML) SYRINGE FOR IV PUSH (FOR BLOOD PRESSURE SUPPORT)
PREFILLED_SYRINGE | INTRAVENOUS | Status: DC | PRN
  Administered 2017-01-09 (×2): 80 ug via INTRAVENOUS
  Administered 2017-01-09: 120 ug via INTRAVENOUS
  Administered 2017-01-09: 80 ug via INTRAVENOUS
  Administered 2017-01-09: 40 ug via INTRAVENOUS
  Administered 2017-01-09 (×2): 80 ug via INTRAVENOUS
  Administered 2017-01-09: 40 ug via INTRAVENOUS
  Administered 2017-01-09: 80 ug via INTRAVENOUS
  Administered 2017-01-09: 40 ug via INTRAVENOUS
  Administered 2017-01-09 (×2): 80 ug via INTRAVENOUS
  Administered 2017-01-09: 40 ug via INTRAVENOUS
  Administered 2017-01-09 (×2): 80 ug via INTRAVENOUS
  Administered 2017-01-09: 40 ug via INTRAVENOUS

## 2017-01-09 MED ORDER — MIDAZOLAM HCL 2 MG/2ML IJ SOLN
INTRAMUSCULAR | Status: AC
  Filled 2017-01-09: qty 2

## 2017-01-09 MED ORDER — ALVIMOPAN 12 MG PO CAPS
12.0000 mg | ORAL_CAPSULE | Freq: Once | ORAL | Status: AC
  Administered 2017-01-09: 12 mg via ORAL
  Filled 2017-01-09: qty 1

## 2017-01-09 MED ORDER — DIPHENHYDRAMINE HCL 12.5 MG/5ML PO ELIX
12.5000 mg | ORAL_SOLUTION | Freq: Four times a day (QID) | ORAL | Status: DC | PRN
Start: 2017-01-09 — End: 2017-01-14

## 2017-01-09 MED ORDER — CEFOTETAN DISODIUM-DEXTROSE 2-2.08 GM-% IV SOLR
INTRAVENOUS | Status: AC
  Filled 2017-01-09: qty 50

## 2017-01-09 MED ORDER — DEXTROSE 5 % IV SOLN
2.0000 g | INTRAVENOUS | Status: AC
  Administered 2017-01-09: 2 g via INTRAVENOUS

## 2017-01-09 MED ORDER — MIDAZOLAM HCL 5 MG/5ML IJ SOLN
INTRAMUSCULAR | Status: DC | PRN
  Administered 2017-01-09 (×2): 1 mg via INTRAVENOUS

## 2017-01-09 SURGICAL SUPPLY — 97 items
BLADE EXTENDED COATED 6.5IN (ELECTRODE) IMPLANT
CANNULA REDUC XI 12-8 STAPL (CANNULA) ×1
CANNULA REDUC XI 12-8MM STAPL (CANNULA) ×1
CANNULA REDUCER 12-8 DVNC XI (CANNULA) ×1 IMPLANT
CELLS DAT CNTRL 66122 CELL SVR (MISCELLANEOUS) IMPLANT
CLIP LIGATING HEM O LOK PURPLE (MISCELLANEOUS) IMPLANT
CLIP LIGATING HEMOLOK MED (MISCELLANEOUS) IMPLANT
COUNTER NEEDLE 20 DBL MAG RED (NEEDLE) ×3 IMPLANT
COVER MAYO STAND STRL (DRAPES) ×6 IMPLANT
COVER TIP SHEARS 8 DVNC (MISCELLANEOUS) ×2 IMPLANT
COVER TIP SHEARS 8MM DA VINCI (MISCELLANEOUS) ×4
DERMABOND ADVANCED (GAUZE/BANDAGES/DRESSINGS) ×2
DERMABOND ADVANCED .7 DNX12 (GAUZE/BANDAGES/DRESSINGS) ×1 IMPLANT
DEVICE TROCAR PUNCTURE CLOSURE (ENDOMECHANICALS) ×3 IMPLANT
DRAIN CHANNEL 19F RND (DRAIN) IMPLANT
DRAPE ARM DVNC X/XI (DISPOSABLE) ×4 IMPLANT
DRAPE COLUMN DVNC XI (DISPOSABLE) ×1 IMPLANT
DRAPE DA VINCI XI ARM (DISPOSABLE) ×8
DRAPE DA VINCI XI COLUMN (DISPOSABLE) ×2
DRAPE SURG IRRIG POUCH 19X23 (DRAPES) ×3 IMPLANT
DRSG OPSITE POSTOP 4X10 (GAUZE/BANDAGES/DRESSINGS) IMPLANT
DRSG OPSITE POSTOP 4X6 (GAUZE/BANDAGES/DRESSINGS) IMPLANT
DRSG OPSITE POSTOP 4X8 (GAUZE/BANDAGES/DRESSINGS) IMPLANT
ELECT PENCIL ROCKER SW 15FT (MISCELLANEOUS) ×6 IMPLANT
ELECT REM PT RETURN 15FT ADLT (MISCELLANEOUS) ×3 IMPLANT
ENDOLOOP SUT PDS II  0 18 (SUTURE)
ENDOLOOP SUT PDS II 0 18 (SUTURE) IMPLANT
EVACUATOR SILICONE 100CC (DRAIN) ×3 IMPLANT
GAUZE SPONGE 4X4 12PLY STRL (GAUZE/BANDAGES/DRESSINGS) IMPLANT
GLOVE BIO SURGEON STRL SZ 6.5 (GLOVE) ×6 IMPLANT
GLOVE BIO SURGEONS STRL SZ 6.5 (GLOVE) ×3
GLOVE BIOGEL PI IND STRL 7.0 (GLOVE) ×3 IMPLANT
GLOVE BIOGEL PI INDICATOR 7.0 (GLOVE) ×6
GOWN STRL REUS W/TWL 2XL LVL3 (GOWN DISPOSABLE) ×9 IMPLANT
GOWN STRL REUS W/TWL XL LVL3 (GOWN DISPOSABLE) ×12 IMPLANT
GRASPER ENDOPATH ANVIL 10MM (MISCELLANEOUS) ×3 IMPLANT
HOLDER FOLEY CATH W/STRAP (MISCELLANEOUS) ×3 IMPLANT
KIT PROCEDURE DA VINCI SI (MISCELLANEOUS) ×2
KIT PROCEDURE DVNC SI (MISCELLANEOUS) ×1 IMPLANT
LEGGING LITHOTOMY PAIR STRL (DRAPES) ×3 IMPLANT
NEEDLE INSUFFLATION 14GA 120MM (NEEDLE) ×3 IMPLANT
PACK CARDIOVASCULAR III (CUSTOM PROCEDURE TRAY) ×3 IMPLANT
PACK COLON (CUSTOM PROCEDURE TRAY) ×3 IMPLANT
PORT LAP GEL ALEXIS MED 5-9CM (MISCELLANEOUS) ×3 IMPLANT
RELOAD PROXIMATE 75MM BLUE (ENDOMECHANICALS) ×6 IMPLANT
RTRCTR WOUND ALEXIS 18CM MED (MISCELLANEOUS)
SCISSORS LAP 5X35 DISP (ENDOMECHANICALS) ×3 IMPLANT
SEAL CANN UNIV 5-8 DVNC XI (MISCELLANEOUS) ×3 IMPLANT
SEAL XI 5MM-8MM UNIVERSAL (MISCELLANEOUS) ×6
SEALER VESSEL DA VINCI XI (MISCELLANEOUS) ×2
SEALER VESSEL EXT DVNC XI (MISCELLANEOUS) ×1 IMPLANT
SET IRRIG TUBING LAPAROSCOPIC (IRRIGATION / IRRIGATOR) ×3 IMPLANT
SLEEVE ADV FIXATION 5X100MM (TROCAR) IMPLANT
SOLUTION ELECTROLUBE (MISCELLANEOUS) ×3 IMPLANT
STAPLER 45 BLU RELOAD XI (STAPLE) ×1 IMPLANT
STAPLER 45 BLUE RELOAD XI (STAPLE) ×2
STAPLER 45 GREEN RELOAD XI (STAPLE) ×4
STAPLER 45 GRN RELOAD XI (STAPLE) ×2 IMPLANT
STAPLER CANNULA SEAL DVNC XI (STAPLE) ×1 IMPLANT
STAPLER CANNULA SEAL XI (STAPLE) ×2
STAPLER CIRC ILS CVD 33MM 37CM (STAPLE) ×3 IMPLANT
STAPLER GUN LINEAR PROX 60 (STAPLE) ×3 IMPLANT
STAPLER PROXIMATE 75MM BLUE (STAPLE) ×6 IMPLANT
STAPLER SHEATH (SHEATH) ×2
STAPLER SHEATH ENDOWRIST DVNC (SHEATH) ×1 IMPLANT
STAPLER VISISTAT 35W (STAPLE) ×3 IMPLANT
SUT ETHILON 2 0 PS N (SUTURE) ×3 IMPLANT
SUT NOVA NAB GS-21 0 18 T12 DT (SUTURE) ×6 IMPLANT
SUT PDS AB 1 CTX 36 (SUTURE) IMPLANT
SUT PDS AB 1 TP1 96 (SUTURE) IMPLANT
SUT PROLENE 2 0 KS (SUTURE) ×6 IMPLANT
SUT SILK 2 0 (SUTURE) ×2
SUT SILK 2 0 SH CR/8 (SUTURE) ×3 IMPLANT
SUT SILK 2-0 18XBRD TIE 12 (SUTURE) ×1 IMPLANT
SUT SILK 3 0 (SUTURE) ×2
SUT SILK 3 0 SH CR/8 (SUTURE) ×6 IMPLANT
SUT SILK 3-0 18XBRD TIE 12 (SUTURE) ×1 IMPLANT
SUT V-LOC BARB 180 2/0GR6 GS22 (SUTURE)
SUT VIC AB 2-0 SH 18 (SUTURE) ×3 IMPLANT
SUT VIC AB 2-0 SH 27 (SUTURE) ×2
SUT VIC AB 2-0 SH 27X BRD (SUTURE) ×1 IMPLANT
SUT VIC AB 3-0 SH 18 (SUTURE) IMPLANT
SUT VIC AB 4-0 PS2 27 (SUTURE) ×6 IMPLANT
SUT VICRYL 0 UR6 27IN ABS (SUTURE) ×6 IMPLANT
SUT VLOC BARB 180 ABS3/0GR12 (SUTURE) ×3
SUTURE V-LC BRB 180 2/0GR6GS22 (SUTURE) IMPLANT
SUTURE VLOC BRB 180 ABS3/0GR12 (SUTURE) ×1 IMPLANT
SYR 10ML LL (SYRINGE) ×3 IMPLANT
SYS LAPSCP GELPORT 120MM (MISCELLANEOUS)
SYSTEM LAPSCP GELPORT 120MM (MISCELLANEOUS) IMPLANT
TOWEL OR 17X26 10 PK STRL BLUE (TOWEL DISPOSABLE) ×3 IMPLANT
TOWEL OR NON WOVEN STRL DISP B (DISPOSABLE) ×3 IMPLANT
TRAY FOLEY CATH SILVER 14FR (SET/KITS/TRAYS/PACK) ×3 IMPLANT
TROCAR ADV FIXATION 5X100MM (TROCAR) ×3 IMPLANT
TUBING CONNECTING 10 (TUBING) IMPLANT
TUBING CONNECTING 10' (TUBING)
TUBING INSUFFLATION 10FT LAP (TUBING) ×3 IMPLANT

## 2017-01-09 NOTE — Transfer of Care (Signed)
Immediate Anesthesia Transfer of Care Note  Patient: Nichole Cross  Procedure(s) Performed: Procedure(s): XI ROBOTIC ASSISTED LOWER ANTERIOR RESECTION (N/A)  Patient Location: PACU  Anesthesia Type:General  Level of Consciousness: Patient easily awoken, sedated, comfortable, cooperative, following commands, responds to stimulation.   Airway & Oxygen Therapy: Patient spontaneously breathing, ventilating well, oxygen via simple oxygen mask.  Post-op Assessment: Report given to PACU RN, vital signs reviewed and stable, moving all extremities.   Post vital signs: Reviewed and stable.  Complications: No apparent anesthesia complications  Last Vitals:  Vitals:   01/09/17 0536 01/09/17 1355  BP: (!) 129/8 100/80  Pulse: 72 89  Resp: 16 12  Temp: 36.2 C 36.4 C    Last Pain:  Vitals:   01/09/17 0536  TempSrc: Oral      Patients Stated Pain Goal: 4 ([id number])  Complications: No apparent anesthesia complications

## 2017-01-09 NOTE — Anesthesia Procedure Notes (Signed)
Procedure Name: Intubation Date/Time: 01/09/2017 7:38 AM Performed by: Deliah Boston Pre-anesthesia Checklist: Patient identified, Emergency Drugs available, Suction available and Patient being monitored Patient Re-evaluated:Patient Re-evaluated prior to inductionOxygen Delivery Method: Circle system utilized Preoxygenation: Pre-oxygenation with 100% oxygen Intubation Type: IV induction Ventilation: Mask ventilation without difficulty Laryngoscope Size: Mac and 3 Grade View: Grade I Tube type: Oral Tube size: 7.0 mm Number of attempts: 1 Airway Equipment and Method: Stylet and Oral airway Placement Confirmation: ETT inserted through vocal cords under direct vision,  positive ETCO2 and breath sounds checked- equal and bilateral Secured at: 18 cm Tube secured with: Tape Dental Injury: Teeth and Oropharynx as per pre-operative assessment

## 2017-01-09 NOTE — Op Note (Signed)
01/09/2017  1:39 PM  PATIENT:  Nichole Cross  76 y.o. female  Patient Care Team: [name], MD as PCP - General (Internal Medicine)  PRE-OPERATIVE DIAGNOSIS:  RECURRENT COLON CANCER  POST-OPERATIVE DIAGNOSIS:  RECURRENT COLON CANCER  PROCEDURE:   XI ROBOTIC ASSISTED LOWER ANTERIOR RESECTION SMALL BOWEL RESECTION   Surgeon(s): Leighton Ruff, MD Michael Boston, MD  ASSISTANT: Dr Johney Maine   ANESTHESIA:   local and general  EBL: 334ml Total I/O In: 2000 [I.V.:2000] Out: 525 [Urine:225; Blood:300]  Delay start of Pharmacological VTE agent (>24hrs) due to surgical blood loss or risk of bleeding:  no  DRAINS: (69F) Jackson-Pratt drain(s) with closed bulb suction in the pelvis   SPECIMEN:  Source of Specimen:  Rectosigmoid, small bowel, distal rectal margin  DISPOSITION OF SPECIMEN:  PATHOLOGY  COUNTS:  YES  PLAN OF CARE: Admit to inpatient   PATIENT DISPOSITION:  PACU - hemodynamically stable.  INDICATION:    76 y.o. F with an anastomotic recurrence after sigmoid resection.  I recommended low anterior resection:  The anatomy & physiology of the digestive tract was discussed.  The pathophysiology was discussed.  Natural history risks without surgery was discussed.   I worked to give an overview of the disease and the frequent need to have multispecialty involvement.  I feel the risks of no intervention will lead to serious problems that outweigh the operative risks; therefore, I recommended a partial colectomy to remove the pathology.  Laparoscopic & open techniques were discussed.   Risks such as bleeding, infection, abscess, leak, reoperation, possible ostomy, hernia, heart attack, death, and other risks were discussed.  I noted a good likelihood this will help address the problem.   Goals of post-operative recovery were discussed as well.    The patient expressed understanding & wished to proceed with surgery.  OR FINDINGS:   Patient had significant adhesions in her  pelvis involving a loop of jejunum.   No obvious metastatic disease on visceral parietal peritoneum or liver.  The anastomosis rests ~7 cm from the anal verge by rigid proctoscopy.  DESCRIPTION:   Informed consent was confirmed.  The patient underwent general anaesthesia without difficulty.  The patient was positioned appropriately.  VTE prevention in place.  The patient's abdomen was clipped, prepped, & draped in a sterile fashion.  Surgical timeout confirmed our plan.  The patient was positioned in reverse Trendelenburg.  Abdominal entry was gained using a Varies needle in the LUQ.  Entry was clean.  I induced carbon dioxide insufflation.  An 8 mm robotic port was placed.  Camera inspection revealed no injury.  Extra ports were carefully placed under direct laparoscopic visualization.  The anterior adhesions to the abdominal wall were taken down laparoscopically using laparoscopic scissors. The pelvic incisions were left in place.  Robotic instruments were placed under direct visualization. The robot was then docked to the patient's left side. The patient was placed in Trendelenburg position.  I began by sharply dissecting the anterior pelvic adhesions and bringing down the small bowel carefully. I removed several loops of small bowel out of the anterior pelvis. I then began to dissect the posterior loops of small bowel. There was one loop of jejunum that was densely adherent to the sacrum. I was unable to mobilize this intact. I transected the bowel and brought this out of the pelvis. I was concerned that the small bowel was adherent to the tumor. I then mobilized the sigmoid colon down the white line of Toldt and  bluntly mobilized the retro-peritoneal border. I mobilized the sigmoid down to the level of the anastomosis. Identified the left ureter and skeletonized this away from the resection plane. I continued to dissect until I identified the anterior rectum. I then used a vessel sealer robotically  to bluntly dissect out the rectum and divided the mesorectum off of the posterior border. After this was completed I was able to identify the small bowel. The piece of small bowel was adherent to the sacrum and not to the tumor. This was divided and removed from the abdomen separately. At this point we inspected the bowel as it was completely mobilized from the pelvis. The tumor rested approximately 10 cm from the anal canal. I divided the rectum distal to the tumor using green load robotic staplers.    I then used firefly to confirm perfusion of her colon. Her descending colon appeared perfuse well, quickly.  I identified the distal border of perfusion. The mesentery risks that was resected in this section. I then made a small incision in her lower midline scar and carried this down to the fascia. The fascia was divided with electrocautery. The peritoneum was entered bluntly. An [place] wound protector was placed. The colon was then brought out of the incision and divided at the previously marked site of good perfusion. A purse stringer was used to place a 2-0 Prolene pursestring. A 33 mm EEA a.m. for was then inserted. The purse string was tied tightly around this. This was then placed back into the abdomen. The small bowel that was initially partially resected was brought out of the abdomen. This portion was transected using a 75 mm blue load stapler. An anastomosis was created also using the 75 mm blue load stapler. A TA 60 mm blue load stapler was then used to close the common enterotomy channel. An anti-tension suture was placed with a 3-0 silk suture. The mesenteric defect was closed using interrupted 3-0 silk sutures. This was then placed back into the abdomen. We laparoscopically ran the entire small bowel. There were no other signs of injury. Several adhesions were taken down laparoscopically using the scissors. At this point anastomosis was created using the EEA stapler and the remaining colon. There was  no tension on the anastomosis. There was no leak when tested with insufflation under water. The anastomosis rests approximate 7-8 cm from anal verge. A 19 [place] Blake drain was placed into the pelvis.  This was brought out through the right lower quadrant. We then closed the 12 mm port using a laparoscopic suture passer and 2-0 Vicryl interrupted sutures. The remaining ports were removed. We switched to clean gowns, gloves, instrument and drapes. The fascia was closed using interrupted 0 Novafil sutures. The subcutaneous tissue was reapproximated using interrupted 2-0 Vicryl sutures. Skin was closed using a 4-0 Vicryl running suture. The port sites were also closed using a running 4-0 Vicryl suture and skin glue. A dressing was placed over the extraction site. The patient was then awakened from anesthesia and sent to the postanesthesia care unit in stable condition. All counts were correct per operating room staff.

## 2017-01-09 NOTE — Anesthesia Preprocedure Evaluation (Signed)
Anesthesia Evaluation  Patient identified by MRN, date of birth, ID band Patient awake    Reviewed: Allergy & Precautions, NPO status , Patient's Chart, lab work & pertinent test results  Airway Mallampati: II  TM Distance: >3 FB Neck ROM: Full    Dental no notable dental hx.    Pulmonary neg pulmonary ROS,    Pulmonary exam normal breath sounds clear to auscultation       Cardiovascular negative cardio ROS Normal cardiovascular exam Rhythm:Regular Rate:Normal     Neuro/Psych negative neurological ROS  negative psych ROS   GI/Hepatic negative GI ROS, Neg liver ROS,   Endo/Other  negative endocrine ROS  Renal/GU negative Renal ROS  negative genitourinary   Musculoskeletal negative musculoskeletal ROS (+)   Abdominal   Peds negative pediatric ROS (+)  Hematology negative hematology ROS (+)   Anesthesia Other Findings   Reproductive/Obstetrics negative OB ROS                            Anesthesia Physical Anesthesia Plan  ASA: II  Anesthesia Plan: General   Post-op Pain Management:    Induction: Intravenous  Airway Management Planned: Oral ETT  Additional Equipment:   Intra-op Plan:   Post-operative Plan: Extubation in OR  Informed Consent: I have reviewed the patients History and Physical, chart, labs and discussed the procedure including the risks, benefits and alternatives for the proposed anesthesia with the patient or authorized representative who has indicated his/her understanding and acceptance.   Dental advisory given  Plan Discussed with: CRNA and Surgeon  Anesthesia Plan Comments:         Anesthesia Quick Evaluation  

## 2017-01-09 NOTE — H&P (Signed)
History of Present Illness  The patient is a 76 year old female who presents with a liver mass. Patient is a 75 year old female who underwent a laparoscopic-assisted sigmoid colectomy in September 2016 by Dr. Zella Richer. She had no evidence of metastatic disease at the time of diagnosis. She started having a rising CEA in February 2017 which continued to rise. CTs performed in July show a new lesion in the left liver a left renal mass, and a new lesion in the right posterior liver. She underwent ablation of this in September 2017. She developed severe abdominal pain on chemotherapy in November 2017. A CT scan showed descending colitis. She underwent a colonoscopy which showed recurrent tumor at her anastomotic site. It does not appear that the site of colitis was directly visualized or biopsied. Since that time she has developed obstructive symptoms. She is taking MiraLAX on a daily basis.   Problem List/Past Medical Leighton Ruff, MD; Q000111Q 10:19 AM) STATUS POST PARTIAL COLECTOMY (Z90.49) ENCOUNTER FOR REMOVAL OF STAPLES (Z48.02) LOCAL RECURRENCE OF COLON CANCER (C18.9) MALIGNANT NEOPLASM OF SIGMOID COLON (C18.7)  Past Surgical History Leighton Ruff, MD; Q000111Q 10:19 AM) Hemorrhoidectomy Hysterectomy (not due to cancer) - Complete Knee Surgery Left. Thyroid Surgery  Diagnostic Studies History Leighton Ruff, MD; Q000111Q 10:19 AM) Colonoscopy within last year Mammogram within last year  Allergies Malachy Moan, RMA; 11/24/2016 9:46 AM) Nelwyn [place] *ANALGESICS - NonNarcotic*  Medication History Leighton Ruff, MD; Q000111Q 10:19 AM) Hydrocodone-Acetaminophen (5-325MG  Tablet, Oral daily) Active. Prochlorperazine Maleate (5MG  Tablet, Oral daily) Active. Capecitabine (500MG  Tablet, Oral daily) Active. Aspirin-Acetaminophen-Caffeine (250-250-65MG  Tablet, Oral as needed) Active. Diphenhydramine-Acetaminophen (25-500MG  Tablet, Oral daily) Active. Ferrous  Sulfate (324MG  Tablet DR, Oral) Active. Levothyroxine Sodium (137MCG Capsule, Oral daily) Active. Polyethylene Glycol 1000 (daily) Active. TraMADol HCl (50MG  Tablet, Oral daily) Active. Levothyroxine Sodium (137MCG Tablet, Oral) Active. Alendronate Sodium (70MG  Tablet, Oral) Active. Ferrous Sulfate (325 (65 Fe)MG Tablet, Oral three times daily) Active. Medications Reconciled Neomycin Sulfate (500MG  Tablet, 2 (two) Tablet Oral SEE NOTE, Taken starting 11/24/2016) Active. (TAKE TWO TABLETS AT 2 PM, 3 PM, AND 10 PM THE DAY PRIOR TO SURGERY) Flagyl (500MG  Tablet, 2 (two) Tablet Oral SEE NOTE, Taken starting 11/24/2016) Active. (Take at 2pm, 3pm, and 10pm the day prior to your colon operation)  Social History Leighton Ruff, MD; Q000111Q 10:19 AM) Caffeine use Carbonated beverages, Coffee, Tea. No alcohol use No drug use Tobacco use Never smoker.  Family History Leighton Ruff, MD; Q000111Q 10:19 AM) Arthritis Mother. Breast Cancer Sister. Diabetes Mellitus Mother. Heart Disease Mother. Heart disease in female family member before age 28 Malignant Neoplasm Of Pancreas Mother. Migraine Headache Daughter. Thyroid problems Daughter.  Pregnancy / Birth History Leighton Ruff, MD; Q000111Q 10:19 AM) Durenda Age 5 Maternal age 47-25 Para 1  Other Problems Leighton Ruff, MD; Q000111Q 10:19 AM) Arthritis Migraine Headache Thyroid Disease     Review of Systems Leighton Ruff MD; Q000111Q 10:19 AM) General Present- Fatigue, Night Sweats and Weight Loss. Not Present- Appetite Loss, Chills, Fever and Weight Gain. Skin Not Present- Change in Wart/Mole, Dryness, Hives, Jaundice, New Lesions, Non-Healing Wounds, Rash and Ulcer. HEENT Present- Seasonal Allergies. Not Present- Earache, Hearing Loss, Hoarseness, Nose Bleed, Oral Ulcers, Ringing in the Ears, Sinus Pain, Sore Throat, Visual Disturbances, Wears glasses/contact lenses and Yellow Eyes. Respiratory Not  Present- Bloody sputum, Chronic Cough, Difficulty Breathing, Snoring and Wheezing. Breast Not Present- Breast Mass, Breast Pain, Nipple Discharge and Skin Changes. Cardiovascular Present- Leg Cramps. Not Present- Chest Pain, Difficulty Breathing Lying Down, Palpitations,  Rapid Heart Rate, Shortness of Breath and Swelling of Extremities. Gastrointestinal Present- Abdominal Pain, Bloody Stool, Chronic diarrhea, Nausea and Vomiting. Not Present- Bloating, Change in Bowel Habits, Constipation, Difficulty Swallowing, Excessive gas, Gets full quickly at meals, Hemorrhoids, Indigestion and Rectal Pain. Musculoskeletal Present- Joint Stiffness. Not Present- Back Pain, Joint Pain, Muscle Pain, Muscle Weakness and Swelling of Extremities. Neurological Present- Headaches. Not Present- Decreased Memory, Fainting, Numbness, Seizures, Tingling, Tremor, Trouble walking and Weakness. Psychiatric Not Present- Anxiety, Bipolar, Change in Sleep Pattern, Depression, Fearful and Frequent crying. Hematology Present- Easy Bruising. Not Present- Excessive bleeding, Gland problems, HIV and Persistent Infections. All other systems negative  BP (!) 129/8   Pulse 72   Temp 97.2 F (36.2 C) (Oral)   Resp 16   Ht 5\' 7"  (1.702 m)   Wt 80.5 kg (177 lb 9 oz)   SpO2 99%   BMI 27.81 kg/m     Physical Exam   The physical exam findings are as follows: Note:General: WDWN elderly female in NAD. Pleasant and cooperative.  HEENT: [place]/AT, no facial masses  EYES: EOMI, no icterus  NECK: Supple, no obvious mass or thyroid enlargement.  CV: RRR.  CHEST: Breath sounds equal and clear. Respirations nonlabored.  ABDOMEN: Soft, nontender, nondistended, no masses, no organomegaly, active bowel sounds, midline scar with no hernias  MUSCULOSKELETAL: FROM, good muscle tone, no edema, no venous stasis changes  SKIN: No jaundice.  NEUROLOGIC: Alert and oriented, answers questions appropriately, normal  gait and station.  PSYCHIATRIC: Normal mood, affect , and behavior.    Assessment & Plan   COLON CANCER METASTASIZED TO LIVER (C18.9)  LOCAL RECURRENCE OF COLON CANCER (C18.9) Impression: 76 year old female with staple and recurrence of colon cancer. She is status post a laparoscopic-assisted sigmoid colectomy in September 2016. She developed severe abdominal pain was seen in the emergency department in November. CT scan showed some descending colon colitis. She underwent a colonoscopy which showed a recurrent mass at the staple line. She is status post liver ablation procedure for metastatic disease in September 2017. I am concerned that her CT findings are possibly ischemic colitis. There does appear to be a good portion of colon below this that appears to be without compromise. I have recommended a robotic low anterior resection with resection of the anastomosis. We will then use firefly to determine a portion of viable colon for her anastomosis. She understands that this may require resection of her entire descending colon and performing a transverse to rectum anastomosis. The surgery and anatomy were described to the patient as well as the risks of surgery and the possible complications. These include: Bleeding, deep abdominal infections and possible wound complications such as hernia and infection, damage to adjacent structures, leak of surgical connections, which can lead to other surgeries and possibly an ostomy, possible need for other procedures, such as abscess drains in radiology, possible prolonged hospital stay, possible diarrhea from removal of part of the colon, possible constipation from narcotics, possible bowel, bladder or sexual dysfunction if having rectal surgery, prolonged fatigue/weakness or appetite loss, possible early recurrence of of disease, possible complications of their medical problems such as heart disease or arrhythmias or lung problems, death (less than 1%). I  believe the patient understands and wishes to proceed with the surgery.

## 2017-01-09 NOTE — Anesthesia Postprocedure Evaluation (Signed)
Anesthesia Post Note  Patient: Randi P Mozer  Procedure(s) Performed: Procedure(s) (LRB): XI ROBOTIC ASSISTED LOWER ANTERIOR RESECTION (N/A)  Patient location during evaluation: PACU Anesthesia Type: General Level of consciousness: awake and alert Pain management: pain level controlled Vital Signs Assessment: post-procedure vital signs reviewed and stable Respiratory status: spontaneous breathing, nonlabored ventilation, respiratory function stable and patient connected to nasal cannula oxygen Cardiovascular status: blood pressure returned to baseline and stable Postop Assessment: no signs of nausea or vomiting Anesthetic complications: no       Last Vitals:  Vitals:   01/09/17 1600 01/09/17 1603  BP: 98/80 102/64  Pulse: 95 88  Resp: 14 13  Temp:      Last Pain:  Vitals:   01/09/17 1545  TempSrc:   PainSc: (P) 5                  Amica Harron,W. EDMOND

## 2017-01-10 LAB — CBC
HCT: 32.7 % — ABNORMAL LOW (ref 36.0–46.0)
Hemoglobin: 10.6 g/dL — ABNORMAL LOW (ref 12.0–15.0)
MCH: 28.6 pg (ref 26.0–34.0)
MCHC: 32.4 g/dL (ref 30.0–36.0)
MCV: 88.1 fL (ref 78.0–100.0)
PLATELETS: 152 10*3/uL (ref 150–400)
RBC: 3.71 MIL/uL — ABNORMAL LOW (ref 3.87–5.11)
RDW: 14.7 % (ref 11.5–15.5)
WBC: 9.5 10*3/uL (ref 4.0–10.5)

## 2017-01-10 LAB — BASIC METABOLIC PANEL
Anion gap: 9 (ref 5–15)
BUN: 17 mg/dL (ref 6–20)
CALCIUM: 8.5 mg/dL — AB (ref 8.9–10.3)
CHLORIDE: 106 mmol/L (ref 101–111)
CO2: 22 mmol/L (ref 22–32)
CREATININE: 1.36 mg/dL — AB (ref 0.44–1.00)
GFR calc non Af Amer: 38 mL/min — ABNORMAL LOW (ref >60)
GFR, EST AFRICAN AMERICAN: 44 mL/min — AB (ref >60)
Glucose, Bld: 160 mg/dL — ABNORMAL HIGH (ref 65–99)
Potassium: 4 mmol/L (ref 3.5–5.1)
SODIUM: 137 mmol/L (ref 135–145)

## 2017-01-10 LAB — HEMOGLOBIN A1C
HEMOGLOBIN A1C: 5.3 % (ref 4.8–5.6)
MEAN PLASMA GLUCOSE: 105 mg/dL

## 2017-01-10 MED ORDER — HYDROMORPHONE HCL 2 MG/ML IJ SOLN
0.5000 mg | INTRAMUSCULAR | Status: DC | PRN
  Administered 2017-01-10 – 2017-01-13 (×13): 0.5 mg via INTRAVENOUS
  Filled 2017-01-10 (×13): qty 1

## 2017-01-10 NOTE — Progress Notes (Signed)
Pharmacy Brief Note - Alvimopan (Entereg)  The standing order set for alvimopan (Entereg) now includes an automatic order to discontinue the drug after the patient has had a bowel movement.  The change was approved by the [place] and the Medical Executive Committee.    This patient has had a bowel movement documented by nursing.  Therefore, alvimopan has been discontinued.  If there are questions, please contact the pharmacy at [phone].  Thank you   Royetta Asal, PharmD, BCPS Pager [phone] 01/10/2017 12:07 PM

## 2017-01-10 NOTE — Evaluation (Signed)
Physical Therapy Evaluation Patient Details Name: Nichole Cross MRN: [id number] DOB: 1941/06/08 Today's Date: 01/10/2017   History of Present Illness  Pt admitted with reoccurence of colon CA - s/p small bowel resect 01/09/17.    Clinical Impression  Pt admitted as above and presenting with functional mobility limitations 2* generalized weakness, post op pain, and ambulatory balance deficits.  Pt should progress to dc home with assist of family.    Follow Up Recommendations No PT follow up    Equipment Recommendations  None recommended by PT    Recommendations for Other Services OT consult     Precautions / Restrictions Precautions Precautions: Fall;Other (comment) Precaution Comments: JP drain on R.  Pt is incontinent of bowel - pt states I can not tell when I have to go until it starts Restrictions Weight Bearing Restrictions: No      Mobility  Bed Mobility Overal bed mobility: Needs Assistance Bed Mobility: Rolling;Sidelying to Sit Rolling: Mod assist Sidelying to sit: Mod assist       General bed mobility comments: cues for technique and sequence.  Physical assist to complete roll and to transition to upright sitting  Transfers Overall transfer level: Needs assistance Equipment used: Rolling walker (2 wheeled) Transfers: Sit to/from Stand Sit to Stand: Min assist;+2 physical assistance;From elevated surface         General transfer comment: cues for use of UEs to self assist  Ambulation/Gait Ambulation/Gait assistance: Min assist;+2 safety/equipment Ambulation Distance (Feet): 6 Feet Assistive device: Rolling walker (2 wheeled) Gait Pattern/deviations: Step-to pattern;Decreased step length - right;Decreased step length - left;Shuffle;Trunk flexed Gait velocity: decr   General Gait Details: cues for posture and position from RW; Distance ltd by pt c/o wooziness (BP99/52) and pts ongoing bowel incontinence  Stairs            Wheelchair Mobility     Modified Rankin (Stroke Patients Only)       Balance Overall balance assessment: Needs assistance Sitting-balance support: No upper extremity supported;Feet supported Sitting balance-Leahy Scale: Fair     Standing balance support: Bilateral upper extremity supported Standing balance-Leahy Scale: Poor                               Pertinent Vitals/Pain Pain Assessment: 0-10 Pain Score: 5  Pain Location: abd pain Pain Descriptors / Indicators: Sore Pain Intervention(s): Limited activity within patient's tolerance;Monitored during session    Home Living Family/patient expects to be discharged to:: Private residence Living Arrangements: Alone Available Help at Discharge: Family Type of Home: House Home Access: Stairs to enter Entrance Stairs-Rails: Right Entrance Stairs-Number of Steps: 2 Home Layout: One level Home Equipment: Environmental consultant - 2 wheels Additional Comments: Dtrs will be assisting    Prior Function Level of Independence: Independent               Hand Dominance        Extremity/Trunk Assessment   Upper Extremity Assessment Upper Extremity Assessment: Generalized weakness    Lower Extremity Assessment Lower Extremity Assessment: Generalized weakness       Communication   Communication: No difficulties  Cognition Arousal/Alertness: Awake/alert Behavior During Therapy: WFL for tasks assessed/performed Overall Cognitive Status: Within Functional Limits for tasks assessed                      General Comments      Exercises     Assessment/Plan    PT  Assessment Patient needs continued PT services  PT Problem List Decreased strength;Decreased activity tolerance;Decreased balance;Decreased mobility;Decreased knowledge of use of DME;Decreased safety awareness;Pain       PT Treatment Interventions DME instruction;Gait training;Stair training;Functional mobility training;Therapeutic activities;Therapeutic exercise;Balance  training;Patient/family education    PT Goals (Current goals can be found in the Care Plan section)  Acute Rehab PT Goals Patient Stated Goal: Regain IND PT Goal Formulation: With patient Time For Goal Achievement: 01/24/17 Potential to Achieve Goals: Good    Frequency Min 3X/week   Barriers to discharge        Co-evaluation               End of Session   Activity Tolerance: Patient limited by fatigue;Other (comment) Patient left: in chair;with call bell/phone within reach;with family/visitor present Nurse Communication: Mobility status PT Visit Diagnosis: Difficulty in walking, not elsewhere classified (R26.2);Unsteadiness on feet (R26.81)         Time: [id number] PT Time Calculation (min) (ACUTE ONLY): 20 min   Charges:   PT Evaluation $PT Eval Moderate Complexity: 1 Procedure     PT G Codes:         Nichole Cross 01/10/2017, 12:22 PM

## 2017-01-10 NOTE — Progress Notes (Signed)
1 Day Post-Op Robotic LAR and SBR Subjective: Doing well, hypotension resolved, no nausea, having BM's  Objective: Vital signs in last 24 hours: Temp:  [97.3 F (36.3 C)-98.3 F (36.8 C)] 98.3 F (36.8 C) (02/23 2318) Pulse Rate:  [75-95] 79 (02/24 0900) Resp:  [5-22] 18 (02/24 0900) BP: (60-121)/(20-100) 113/52 (02/24 0900) SpO2:  [92 %-100 %] 95 % (02/24 0900)   Intake/Output from previous day: 02/23 0701 - 02/24 0700 In: 5177.5 [I.V.:4427.5; IV Piggyback:750] Out: 8 [Urine:565; Drains:460; Stool:1; Blood:300] Intake/Output this shift: Total I/O In: -  Out: 70 [Urine:30; Drains:30]   General appearance: alert and cooperative GI: soft, non-distended  Incision: no significant drainage  Lab Results:   Recent Labs  01/10/17 0349  WBC 9.5  HGB 10.6*  HCT 32.7*  PLT 152   BMET  Recent Labs  01/10/17 0349  NA 137  K 4.0  CL 106  CO2 22  GLUCOSE 160*  BUN 17  CREATININE 1.36*  CALCIUM 8.5*   PT/INR No results for input(s): LABPROT, INR in the last 72 hours. ABG No results for input(s): PHART, HCO3 in the last 72 hours.  Invalid input(s): PCO2, PO2  MEDS, Scheduled . acetaminophen  1,000 mg Oral Q6H  . alvimopan  12 mg Oral BID  . enoxaparin (LOVENOX) injection  40 mg Subcutaneous Q24H  . levothyroxine  137 mcg Oral QAC breakfast  . saccharomyces boulardii  250 mg Oral BID    Studies/Results: No results found.  Assessment: s/p Procedure(s): XI ROBOTIC ASSISTED LOWER ANTERIOR RESECTION Patient Active Problem List   Diagnosis Date Noted  . Local recurrence of rectal cancer  ([place]) 01/09/2017  . Adenocarcinoma of colon metastatic to liver ([place]) 07/25/2016  . Osteoporosis 06/07/2016  . Anemia, iron deficiency 06/07/2016  . Sigmoid colon cancer s/p lap assisted sigmoid colectomy 08/09/15 08/09/2015  . Overweight (BMI 25.0-29.9) 09/10/2012  . S/P left UKR 09/09/2012  . ELEVATED BLOOD PRESSURE WITHOUT DIAGNOSIS OF HYPERTENSION 09/30/2010  .  GANGLION OF TENDON SHEATH 12/20/2007  . FATTY LIVER DISEASE 08/13/2007  . Hypothyroidism 08/02/2007  . CYSTITIS 08/02/2007  . Osteoarthritis 08/02/2007    Expected post op course  Plan: Advance diet to clears Transfer to floor Cont IVF's   LOS: 1 day     .[name], [facility], [place]   01/10/2017 10:15 AM

## 2017-01-10 NOTE — Progress Notes (Addendum)
Pt has had several incontinent episodes of loose stools. Pt states that loose stools are normal for her, but the incontinence is not. She is unable to feel sensation on her rectum, notices stool has occurred afterwards.  Will continue to monitor.   0510: pt states she is starting to feel sensation on her rectum, has pain when stools pass. Still not continent, but understands that that may take time due to surgery.

## 2017-01-11 LAB — CBC
HEMATOCRIT: 27.2 % — AB (ref 36.0–46.0)
HEMOGLOBIN: 9.3 g/dL — AB (ref 12.0–15.0)
MCH: 29.1 pg (ref 26.0–34.0)
MCHC: 34.2 g/dL (ref 30.0–36.0)
MCV: 85 fL (ref 78.0–100.0)
Platelets: 149 10*3/uL — ABNORMAL LOW (ref 150–400)
RBC: 3.2 MIL/uL — ABNORMAL LOW (ref 3.87–5.11)
RDW: 15 % (ref 11.5–15.5)
WBC: 8.4 10*3/uL (ref 4.0–10.5)

## 2017-01-11 LAB — BASIC METABOLIC PANEL
ANION GAP: 6 (ref 5–15)
BUN: 13 mg/dL (ref 6–20)
CALCIUM: 8.5 mg/dL — AB (ref 8.9–10.3)
CO2: 23 mmol/L (ref 22–32)
Chloride: 104 mmol/L (ref 101–111)
Creatinine, Ser: 0.89 mg/dL (ref 0.44–1.00)
GFR calc Af Amer: >60 mL/min (ref >60)
GFR calc non Af Amer: >60 mL/min (ref >60)
GLUCOSE: 138 mg/dL — AB (ref 65–99)
POTASSIUM: 4.5 mmol/L (ref 3.5–5.1)
Sodium: 133 mmol/L — ABNORMAL LOW (ref 135–145)

## 2017-01-11 MED ORDER — TRAMADOL HCL 50 MG PO TABS
50.0000 mg | ORAL_TABLET | Freq: Four times a day (QID) | ORAL | Status: DC | PRN
  Administered 2017-01-11 (×2): 50 mg via ORAL
  Administered 2017-01-11 – 2017-01-13 (×4): 100 mg via ORAL
  Administered 2017-01-13 (×2): 50 mg via ORAL
  Filled 2017-01-11: qty 2
  Filled 2017-01-11 (×3): qty 1
  Filled 2017-01-11 (×2): qty 2
  Filled 2017-01-11: qty 1
  Filled 2017-01-11: qty 2

## 2017-01-11 NOTE — Progress Notes (Signed)
2 Days Post-Op Robotic LAR and SBR Subjective: Doing well, hypotension resolved, no nausea, having BM's  Objective: Vital signs in last 24 hours: Temp:  [97.7 F (36.5 C)-99.5 F (37.5 C)] 99.5 F (37.5 C) (02/25 0527) Pulse Rate:  [74-97] 97 (02/25 0527) Resp:  [15-20] 18 (02/25 0527) BP: (111-118)/(47-67) 111/64 (02/25 0527) SpO2:  [94 %-96 %] 96 % (02/25 0527)   Intake/Output from previous day: 02/24 0701 - 02/25 0700 In: 630 [P.O.:180; I.V.:450] Out: 1335 [Urine:1155; Drains:180] Intake/Output this shift: No intake/output data recorded.   General appearance: alert and cooperative GI: soft, non-distended JP: SS fluid Incision: no significant drainage  Lab Results:   Recent Labs  01/10/17 0349 01/11/17 0419  WBC 9.5 8.4  HGB 10.6* 9.3*  HCT 32.7* 27.2*  PLT 152 149*   BMET  Recent Labs  01/10/17 0349 01/11/17 0419  NA 137 133*  K 4.0 4.5  CL 106 104  CO2 22 23  GLUCOSE 160* 138*  BUN 17 13  CREATININE 1.36* 0.89  CALCIUM 8.5* 8.5*   PT/INR No results for input(s): LABPROT, INR in the last 72 hours. ABG No results for input(s): PHART, HCO3 in the last 72 hours.  Invalid input(s): PCO2, PO2  MEDS, Scheduled . acetaminophen  1,000 mg Oral Q6H  . enoxaparin (LOVENOX) injection  40 mg Subcutaneous Q24H  . levothyroxine  137 mcg Oral QAC breakfast  . saccharomyces boulardii  250 mg Oral BID    Studies/Results: No results found.  Assessment: s/p Procedure(s): XI ROBOTIC ASSISTED LOWER ANTERIOR RESECTION Patient Active Problem List   Diagnosis Date Noted  . Local recurrence of rectal cancer  ([place]) 01/09/2017  . Adenocarcinoma of colon metastatic to liver ([place]) 07/25/2016  . Osteoporosis 06/07/2016  . Anemia, iron deficiency 06/07/2016  . Sigmoid colon cancer s/p lap assisted sigmoid colectomy 08/09/15 08/09/2015  . Overweight (BMI 25.0-29.9) 09/10/2012  . S/P left UKR 09/09/2012  . ELEVATED BLOOD PRESSURE WITHOUT DIAGNOSIS OF HYPERTENSION  09/30/2010  . GANGLION OF TENDON SHEATH 12/20/2007  . FATTY LIVER DISEASE 08/13/2007  . Hypothyroidism 08/02/2007  . CYSTITIS 08/02/2007  . Osteoarthritis 08/02/2007    Expected post op course  Plan: Advance diet to soft foods Ambulate PO pain meds Decrease IVF's   LOS: 2 days     .[name], [facility], [place]   01/11/2017 8:30 AM

## 2017-01-12 ENCOUNTER — Encounter (HOSPITAL_COMMUNITY): Payer: Self-pay | Admitting: General Surgery

## 2017-01-12 LAB — BASIC METABOLIC PANEL
Anion gap: 5 (ref 5–15)
BUN: 14 mg/dL (ref 6–20)
CALCIUM: 8.8 mg/dL — AB (ref 8.9–10.3)
CO2: 26 mmol/L (ref 22–32)
CREATININE: 0.89 mg/dL (ref 0.44–1.00)
Chloride: 102 mmol/L (ref 101–111)
GFR calc Af Amer: >60 mL/min (ref >60)
GLUCOSE: 117 mg/dL — AB (ref 65–99)
POTASSIUM: 5 mmol/L (ref 3.5–5.1)
SODIUM: 133 mmol/L — AB (ref 135–145)

## 2017-01-12 LAB — CBC
HEMATOCRIT: 26.5 % — AB (ref 36.0–46.0)
Hemoglobin: 9 g/dL — ABNORMAL LOW (ref 12.0–15.0)
MCH: 29.1 pg (ref 26.0–34.0)
MCHC: 34 g/dL (ref 30.0–36.0)
MCV: 85.8 fL (ref 78.0–100.0)
PLATELETS: 191 10*3/uL (ref 150–400)
RBC: 3.09 MIL/uL — ABNORMAL LOW (ref 3.87–5.11)
RDW: 15.1 % (ref 11.5–15.5)
WBC: 9.3 10*3/uL (ref 4.0–10.5)

## 2017-01-12 MED ORDER — ENSURE ENLIVE PO LIQD
237.0000 mL | Freq: Two times a day (BID) | ORAL | Status: DC
  Administered 2017-01-12 – 2017-01-13 (×3): 237 mL via ORAL

## 2017-01-12 MED ORDER — PROMETHAZINE HCL 25 MG/ML IJ SOLN
6.2500 mg | Freq: Four times a day (QID) | INTRAMUSCULAR | Status: DC | PRN
  Administered 2017-01-12: 6.25 mg via INTRAVENOUS
  Filled 2017-01-12: qty 1

## 2017-01-12 NOTE — Progress Notes (Signed)
Nurse reports on going nausea with Zofran, on a soft diet, but not eating.  I have added some phenergan for refractory N&V.

## 2017-01-12 NOTE — Progress Notes (Signed)
3 Days Post-Op Robotic LAR and SBR Subjective: Doing well, no nausea, having less BM's, no flatus  Objective: Vital signs in last 24 hours: Temp:  [97.6 F (36.4 C)-98.2 F (36.8 C)] 97.6 F (36.4 C) (02/26 0439) Pulse Rate:  [81-97] 81 (02/26 0439) Resp:  [16-18] 16 (02/26 0439) BP: (125-134)/(66-70) 128/67 (02/26 0439) SpO2:  [96 %-98 %] 98 % (02/26 0439)   Intake/Output from previous day: 02/25 0701 - 02/26 0700 In: 998.9 [P.O.:720; I.V.:278.9] Out: 55 [Drains:55] Intake/Output this shift: No intake/output data recorded.   General appearance: alert and cooperative GI: soft, non-distended JP: SS fluid Incision: no significant drainage  Lab Results:   Recent Labs  01/11/17 0419 01/12/17 0438  WBC 8.4 9.3  HGB 9.3* 9.0*  HCT 27.2* 26.5*  PLT 149* 191   BMET  Recent Labs  01/11/17 0419 01/12/17 0438  NA 133* 133*  K 4.5 5.0  CL 104 102  CO2 23 26  GLUCOSE 138* 117*  BUN 13 14  CREATININE 0.89 0.89  CALCIUM 8.5* 8.8*   PT/INR No results for input(s): LABPROT, INR in the last 72 hours. ABG No results for input(s): PHART, HCO3 in the last 72 hours.  Invalid input(s): PCO2, PO2  MEDS, Scheduled . acetaminophen  1,000 mg Oral Q6H  . enoxaparin (LOVENOX) injection  40 mg Subcutaneous Q24H  . feeding supplement (ENSURE ENLIVE)  237 mL Oral BID BM  . levothyroxine  137 mcg Oral QAC breakfast  . saccharomyces boulardii  250 mg Oral BID    Studies/Results: No results found.  Assessment: s/p Procedure(s): XI ROBOTIC ASSISTED LOWER ANTERIOR RESECTION Patient Active Problem List   Diagnosis Date Noted  . Local recurrence of rectal cancer  ([place]) 01/09/2017  . Adenocarcinoma of colon metastatic to liver ([place]) 07/25/2016  . Osteoporosis 06/07/2016  . Anemia, iron deficiency 06/07/2016  . Sigmoid colon cancer s/p lap assisted sigmoid colectomy 08/09/15 08/09/2015  . Overweight (BMI 25.0-29.9) 09/10/2012  . S/P left UKR 09/09/2012  . ELEVATED BLOOD  PRESSURE WITHOUT DIAGNOSIS OF HYPERTENSION 09/30/2010  . GANGLION OF TENDON SHEATH 12/20/2007  . FATTY LIVER DISEASE 08/13/2007  . Hypothyroidism 08/02/2007  . CYSTITIS 08/02/2007  . Osteoarthritis 08/02/2007    Expected post op course  Plan: Reg diet.  Pt to avoid uncooked fruits and vegetables on trays Ambulate PO pain meds Min IVF's Acute blood loss anemia: Appears to be stabilizing.  Recheck Hgb in AM   LOS: 3 days     .Nichole Cross, [facility], [place]   01/12/2017 8:18 AM

## 2017-01-12 NOTE — Progress Notes (Signed)
I agree with the previous nurse's assessment 

## 2017-01-12 NOTE — Progress Notes (Signed)
Pt not feeling as much relief as wanted from Zofran. Still feeling nauseated. On call notified. New order for Phenergan 6.25mg  IV Q6h for refractory nausea and vomiting obtained. Zofran to remain [facility] for N/V.

## 2017-01-12 NOTE — Progress Notes (Signed)
  Physical Therapy Treatment Patient Details Name: Nichole Cross MRN: [id number] DOB: 06/25/1941 Today's Date: 01/12/2017    History of Present Illness Pt admitted with reoccurence of colon CA - s/p small bowel resect 01/09/17.      PT Comments    Mild nausea throughout session.  Assisted OOB to amb a greater distance.   Follow Up Recommendations  No PT follow up     Equipment Recommendations  None recommended by PT    Recommendations for Other Services       Precautions / Restrictions Precautions Precautions: Fall Precaution Comments: R JP drain Restrictions Weight Bearing Restrictions: No    Mobility  Bed Mobility Overal bed mobility: Needs Assistance Bed Mobility: Rolling;Sidelying to Sit Rolling: Mod assist;Min assist Sidelying to sit: Mod assist;Min assist       General bed mobility comments: increased time  Transfers Overall transfer level: Needs assistance Equipment used: Rolling walker (2 wheeled) Transfers: Sit to/from Stand Sit to Stand: Min assist         General transfer comment: 25% cues for use of UEs to self assist  Ambulation/Gait Ambulation/Gait assistance: Min assist;Min guard Ambulation Distance (Feet): 19 Feet Assistive device: Rolling walker (2 wheeled) Gait Pattern/deviations: Step-through pattern;Decreased stride length Gait velocity: decr   General Gait Details: increased time, slow but steady   Financial trader Rankin (Stroke Patients Only)       Balance                                    Cognition Arousal/Alertness: Awake/alert Behavior During Therapy: WFL for tasks assessed/performed Overall Cognitive Status: Within Functional Limits for tasks assessed                      Exercises      General Comments        Pertinent Vitals/Pain Pain Assessment: 0-10 Pain Score: 5  Pain Location: abd pain near drain Pain Descriptors / Indicators:  Tender;Grimacing Pain Intervention(s): Monitored during session    Home Living                      Prior Function            PT Goals (current goals can now be found in the care plan section) Progress towards PT goals: Progressing toward goals    Frequency    Min 3X/week      PT Plan Current plan remains appropriate    Co-evaluation             End of Session Equipment Utilized During Treatment: Gait belt Activity Tolerance: Patient tolerated treatment well Patient left: in chair;with call bell/phone within reach;with family/visitor present Nurse Communication: Mobility status PT Visit Diagnosis: Difficulty in walking, not elsewhere classified (R26.2);Unsteadiness on feet (R26.81)     Time: [id number] PT Time Calculation (min) (ACUTE ONLY): 13 min  Charges:  $Gait Training: 8-22 mins                    G Codes:       Rica Koyanagi  PTA [facility]  Acute  Rehab Pager      [phone]

## 2017-01-13 ENCOUNTER — Encounter (HOSPITAL_COMMUNITY): Payer: Self-pay | Admitting: General Surgery

## 2017-01-13 LAB — CBC
HEMATOCRIT: 26 % — AB (ref 36.0–46.0)
HEMOGLOBIN: 8.7 g/dL — AB (ref 12.0–15.0)
MCH: 28.6 pg (ref 26.0–34.0)
MCHC: 33.5 g/dL (ref 30.0–36.0)
MCV: 85.5 fL (ref 78.0–100.0)
Platelets: 210 10*3/uL (ref 150–400)
RBC: 3.04 MIL/uL — AB (ref 3.87–5.11)
RDW: 14.9 % (ref 11.5–15.5)
WBC: 7.4 10*3/uL (ref 4.0–10.5)

## 2017-01-13 MED ORDER — ACETAMINOPHEN 325 MG PO TABS: 325.0000 mg | ORAL_TABLET | Freq: Four times a day (QID) | ORAL | Status: DC | PRN

## 2017-01-13 MED ORDER — HYDROMORPHONE HCL 1 MG/ML IJ SOLN: 0.5000 mg | INTRAMUSCULAR | Status: DC | PRN

## 2017-01-13 MED ORDER — IBUPROFEN 200 MG PO TABS
400.0000 mg | ORAL_TABLET | Freq: Four times a day (QID) | ORAL | Status: DC | PRN
  Administered 2017-01-13 – 2017-01-14 (×3): 400 mg via ORAL
  Filled 2017-01-13 (×3): qty 2

## 2017-01-13 MED FILL — Lactated Ringer's for Irrigation: Qty: 3000 | Status: AC

## 2017-01-13 NOTE — Progress Notes (Signed)
4 Days Post-Op Robotic LAR and SBR Subjective: Doing well, having some nausea that may be related to pain meds or ambulation.  having less BM's and flatus  Objective: Vital signs in last 24 hours: Temp:  [98 F (36.7 C)-98.6 F (37 C)] 98.6 F (37 C) (02/27 1402) Pulse Rate:  [71-88] 87 (02/27 1402) Resp:  [17-18] 18 (02/27 1402) BP: (127-134)/(60-86) 129/86 (02/27 1402) SpO2:  [94 %-98 %] 96 % (02/27 1402)   Intake/Output from previous day: 02/26 0701 - 02/27 0700 In: 894.5 [P.O.:607; I.V.:287.5] Out: 38 [Urine:850; Drains:33] Intake/Output this shift: Total I/O In: 360 [P.O.:360] Out: 825 [Urine:800; Drains:25]   General appearance: alert and cooperative GI: soft, non-distended JP: SS fluid Incision: no significant drainage, wicks removed  Lab Results:   Recent Labs  01/12/17 0438 01/13/17 0414  WBC 9.3 7.4  HGB 9.0* 8.7*  HCT 26.5* 26.0*  PLT 191 210   BMET  Recent Labs  01/11/17 0419 01/12/17 0438  NA 133* 133*  K 4.5 5.0  CL 104 102  CO2 23 26  GLUCOSE 138* 117*  BUN 13 14  CREATININE 0.89 0.89  CALCIUM 8.5* 8.8*   PT/INR No results for input(s): LABPROT, INR in the last 72 hours. ABG No results for input(s): PHART, HCO3 in the last 72 hours.  Invalid input(s): PCO2, PO2  MEDS, Scheduled . enoxaparin (LOVENOX) injection  40 mg Subcutaneous Q24H  . feeding supplement (ENSURE ENLIVE)  237 mL Oral BID BM  . levothyroxine  137 mcg Oral QAC breakfast  . saccharomyces boulardii  250 mg Oral BID    Studies/Results: No results found.  Assessment: s/p Procedure(s): XI ROBOTIC ASSISTED LOWER ANTERIOR RESECTION Patient Active Problem List   Diagnosis Date Noted  . Local recurrence of rectal cancer  ([place]) 01/09/2017  . Adenocarcinoma of colon metastatic to liver ([place]) 07/25/2016  . Osteoporosis 06/07/2016  . Anemia, iron deficiency 06/07/2016  . Sigmoid colon cancer s/p lap assisted sigmoid colectomy 08/09/15 08/09/2015  . Overweight (BMI  25.0-29.9) 09/10/2012  . S/P left UKR 09/09/2012  . ELEVATED BLOOD PRESSURE WITHOUT DIAGNOSIS OF HYPERTENSION 09/30/2010  . GANGLION OF TENDON SHEATH 12/20/2007  . FATTY LIVER DISEASE 08/13/2007  . Hypothyroidism 08/02/2007  . CYSTITIS 08/02/2007  . Osteoarthritis 08/02/2007    Expected post op course  Plan: Reg diet.  Pt to avoid uncooked fruits and vegetables on trays Ambulate PO pain meds, min narcotics Min IVF's Acute blood loss anemia: Appears to be stabilizing.  Recheck Hgb in AM   LOS: 4 days     .Nichole Cross, [facility], [place]   01/13/2017 2:19 PM

## 2017-01-13 NOTE — Care Management Important Message (Signed)
Important Message  Patient Details  Name: Nichole Cross MRN: [id number] Date of Birth: [birth year]   Medicare Important Message Given:  Yes    [name] 01/13/2017, 10:45 AMImportant Message  Patient Details  Name: Nichole Cross MRN: [id number] Date of Birth: January 04, 1941   Medicare Important Message Given:  Yes    [name] 01/13/2017, 10:44 AM

## 2017-01-13 NOTE — Progress Notes (Signed)
Physical Therapy Treatment Patient Details Name: Nichole Cross MRN: [id number] DOB: 06/30/1941 Today's Date: 01/13/2017    History of Present Illness Pt admitted with reoccurence of colon CA - s/p small bowel resect 01/09/17.      PT Comments    Assisted OOB to amb a greater distance in hallway usine RW for safety but pt will not need to use at home.  Assisted to bathroom then back to bed.  Pt hoping to D/C to home tomorrow.  Follow Up Recommendations  No PT follow up     Equipment Recommendations  None recommended by PT    Recommendations for Other Services       Precautions / Restrictions Precautions Precautions: Fall Precaution Comments: R JP drain Restrictions Weight Bearing Restrictions: No    Mobility  Bed Mobility Overal bed mobility: Modified Independent             General bed mobility comments: increased time  Transfers Overall transfer level: Needs assistance Equipment used: None Transfers: Sit to/from Stand;Stand Pivot Transfers Sit to Stand: Min guard;Supervision Stand pivot transfers: Supervision;Min guard       General transfer comment: good safety cognition and use of hands to steady self  Ambulation/Gait Ambulation/Gait assistance: Supervision Ambulation Distance (Feet): 75 Feet Assistive device: Rolling walker (2 wheeled) Gait Pattern/deviations: Step-through pattern;Trunk flexed Gait velocity: decr Gait velocity interpretation: Below normal speed for age/gender General Gait Details: increased time, slow but steady    increased ABD pain with increased activity   Stairs            Wheelchair Mobility    Modified Rankin (Stroke Patients Only)       Balance                                    Cognition Arousal/Alertness: Awake/alert Behavior During Therapy: WFL for tasks assessed/performed Overall Cognitive Status: Within Functional Limits for tasks assessed                      Exercises       General Comments        Pertinent Vitals/Pain Pain Assessment: 0-10 Pain Score: 7  Pain Location: abd pain near  R drain Pain Descriptors / Indicators: Tender;Grimacing Pain Intervention(s): Monitored during session;Repositioned;Heat applied;Patient requesting pain meds-RN notified    Home Living                      Prior Function            PT Goals (current goals can now be found in the care plan section) Progress towards PT goals: Progressing toward goals    Frequency    Min 3X/week      PT Plan Current plan remains appropriate    Co-evaluation             End of Session Equipment Utilized During Treatment: Gait belt Activity Tolerance: Patient tolerated treatment well Patient left: in bed;with call bell/phone within reach;with family/visitor present Nurse Communication: Patient requests pain meds PT Visit Diagnosis: Difficulty in walking, not elsewhere classified (R26.2);Unsteadiness on feet (R26.81)     Time: [id number] PT Time Calculation (min) (ACUTE ONLY): 18 min  Charges:  $Gait Training: 8-22 mins                    G Codes:       Rica Koyanagi  PTA [facility]  Acute  Rehab Pager      [phone]

## 2017-01-14 LAB — CBC
HCT: 26.1 % — ABNORMAL LOW (ref 36.0–46.0)
HEMOGLOBIN: 8.9 g/dL — AB (ref 12.0–15.0)
MCH: 29 pg (ref 26.0–34.0)
MCHC: 34.1 g/dL (ref 30.0–36.0)
MCV: 85 fL (ref 78.0–100.0)
PLATELETS: 217 10*3/uL (ref 150–400)
RBC: 3.07 MIL/uL — ABNORMAL LOW (ref 3.87–5.11)
RDW: 14.7 % (ref 11.5–15.5)
WBC: 5.3 10*3/uL (ref 4.0–10.5)

## 2017-01-14 NOTE — Progress Notes (Signed)
Pt's vitals were WNL, tolerating diet and pain was under control. Discussed discharge instructions with patient and made sure patient was able to demonstrate back how to empty JP drain and to change dressing. Discharged to home with drain and dressing supplies.

## 2017-01-14 NOTE — Discharge Summary (Signed)
Patient ID: INEZE SUSI [id number] y.o. 07-20-1941  01/09/2017  Discharge date and time: 01/14/2017 11:54 AM  Admitting Physician: [name]  Discharge Physician: [name].  Admission Diagnoses: RECURRENT COLON CANCER  Discharge Diagnoses: same  Operations: Procedure(s): XI ROBOTIC ASSISTED LOWER ANTERIOR RESECTION    Discharged Condition: good    Hospital Course: Patient was admitted after surgery.  Her diet was slowly advanced.  We monitored her Hgb daily as this stabilized.  Her foley was removed on POD2.  She began to ambulate in the hall.  By POD 5 she was in stable condition for discharge home.  She will cont her JP at home as the output was somewhat cloudy.    Consults: None  Significant Diagnostic Studies: labs: cbc, chemistry  Treatments: IV hydration, analgesia: tramadol and surgery: robotic LAR.  Disposition: Home

## 2017-01-14 NOTE — Discharge Instructions (Signed)

## 2017-01-22 ENCOUNTER — Ambulatory Visit (HOSPITAL_BASED_OUTPATIENT_CLINIC_OR_DEPARTMENT_OTHER): Payer: Medicare Other | Admitting: Oncology

## 2017-01-22 ENCOUNTER — Other Ambulatory Visit: Payer: Self-pay | Admitting: *Deleted

## 2017-01-22 ENCOUNTER — Telehealth: Payer: Self-pay | Admitting: Oncology

## 2017-01-22 VITALS — BP 117/73 | HR 105 | Temp 98.1°F | Resp 18 | Wt 169.8 lb

## 2017-01-22 DIAGNOSIS — C187 Malignant neoplasm of sigmoid colon: Secondary | ICD-10-CM

## 2017-01-22 MED ORDER — ZOLPIDEM TARTRATE 5 MG PO TABS: 5.0000 mg | ORAL_TABLET | Freq: Every evening | ORAL | 0 refills | Status: DC | PRN

## 2017-01-22 MED ORDER — TRAMADOL HCL 50 MG PO TABS: 50.0000 mg | ORAL_TABLET | Freq: Four times a day (QID) | ORAL | 0 refills | Status: DC | PRN

## 2017-01-22 NOTE — Progress Notes (Signed)
[facility] OFFICE PROGRESS NOTE   Diagnosis: Colon cancer  INTERVAL HISTORY:   Nichole Cross returns as scheduled. She underwent a robotic assisted low anterior resection and small bowel resection by Dr. Marcello Moores on 01/09/2017. There was no evidence of metastatic disease.  The pathology revealed adenocarcinoma the sigmoid colon with tumor invasion into peri-colonic soft tissue. Resection margins were negative. 12 lymph nodes were negative. The tumor was at the previous anastomotic staple line.  She is recovering from surgery. A JP drain remains laced with minimal output. She reports anorexia. No fever. Her bowel movements are loose. She complains of insomnia and requests a sleep medication. She also request a refill on tramadol which she takes for knee pain.  Objective:  Vital signs in last 24 hours:  Blood pressure 117/73, pulse (!) 105, temperature 98.1 F (36.7 C), temperature source Oral, resp. rate 18, weight 169 lb 12.8 oz (77 kg), SpO2 100 %.    Resp: Lungs clear bilaterally Cardio: Regular rate and rhythm GI: No hepatomegaly, nontender, right lower quadrant drain site with a gauze dressing-tan drainage. The midline abdominal wound has healed Vascular: No leg edema   Lab Results:  Lab Results  Component Value Date   WBC 5.3 01/14/2017   HGB 8.9 (L) 01/14/2017   HCT 26.1 (L) 01/14/2017   MCV 85.0 01/14/2017   PLT 217 01/14/2017   NEUTROABS 2.4 11/20/2016     Medications: I have reviewed the patient's current medications.  Assessment/Plan: 1. Adenocarcinoma the distal sigmoid colon, poorly differentiated, stage II (T3 N0), status post a laparoscopic assisted sigmoid colectomy 08/09/2015 ? No loss of mismatch repair protein expression ? Elevated preoperative CEA ? Staging CT of the abdomen and pelvis 06/22/2015 with no evidence of distant metastatic disease ? Mildly elevated CEA 02/23/2017And 04/17/2016 ? CTs chest, abdomen, and pelvis on 06/04/2016,  64m left upper lobe nodule-no comparison available, new hypodense lesion in the left liver, solid Right renal mass ? MRI 06/10/2016-2 enhancing lesions in the liver consistent with metastatic disease, segment 2 and segment 5. Solid enhancing right renal lesion consistent with a renal cell carcinoma ? Radiofrequency ablation of 2 liver lesions on 07/25/2016 ? Cycle 1 adjuvant Xeloda 08/18/2016 ? Cycle 2 Xeloda 09/08/2016 ? CEA improved 09/26/2016 ? Cycle 3 Xeloda held 09/29/2016 due to unexplained abdominal pain,referred for CT ? CT abdomen/pelvis 09/30/2016 with a long segment of bowel wall thickening involving the distal ileum; lesion left hepatic lobe similar to slightly smaller following ablation; lesion posterior right hepatic lobe elongated favored to represent treatment tract. ? Cycle 3 Xeloda 10/10/2016 (dose reduced due to drug induced enteritis following cycle 2)discontinued 10/15/2016 secondary to abdominal pain. ? CT in while the emergency room with abdominal pain 10/15/2016 revealed descending colitis ? Colonoscopy 10/30/2016 confirmed a mass at the: colo-colonicanastomosis ? Cycle 4 Xeloda 11/03/2016 ? Status post a low anterior resection for removal of locally recurrent tumor at the sigmoid anastomosis, resection margins negative, tumor invades pericolonic soft tissue, 12 benign lymph nodes   2.History of Microcytic anemia-likely iron deficiency anemia secondary to colon cancer  3. Tubular villous adenoma on the sigmoid colon resection specimen 08/09/2015  Multiple tubular adenomas removed on a colonoscopy 11/16/2015  4. Right renal mass on CT 06/04/2016-suspicious for renal cell carcinoma    Disposition: Ms.  BHaltiwangeris recovering from the low anterior resection. Her case was presented at the GI tumor conference on 01/21/2017. The plan is to resume adjuvant Xeloda chemotherapy when she has recovered from surgery.  She will see Dr. Marcello Moores on 01/26/2017 to consider  removal of the JP drain.  We refilled the tramadol and prescribed Ambien for sleep.  I reviewed the surgical pathology findings with Nichole Cross and her son.   25 minutes were spent with the patient today. The majority of the time was used for counseling and coordination of care.  [name], MD  01/22/2017  1:21 PM

## 2017-01-22 NOTE — Telephone Encounter (Signed)
Appointments scheduled per 01/22/17 los. Patient was given a copy of the AVS rport and appointment schedule, per 01/22/17 los. °

## 2017-02-10 ENCOUNTER — Telehealth: Payer: Self-pay | Admitting: Oncology

## 2017-02-10 ENCOUNTER — Ambulatory Visit (HOSPITAL_BASED_OUTPATIENT_CLINIC_OR_DEPARTMENT_OTHER): Payer: Medicare Other | Admitting: Nurse Practitioner

## 2017-02-10 ENCOUNTER — Other Ambulatory Visit (HOSPITAL_BASED_OUTPATIENT_CLINIC_OR_DEPARTMENT_OTHER): Payer: Medicare Other

## 2017-02-10 VITALS — BP 117/62 | HR 80 | Temp 98.7°F | Resp 18 | Ht 67.0 in | Wt 172.2 lb

## 2017-02-10 DIAGNOSIS — C787 Secondary malignant neoplasm of liver and intrahepatic bile duct: Secondary | ICD-10-CM

## 2017-02-10 DIAGNOSIS — N289 Disorder of kidney and ureter, unspecified: Secondary | ICD-10-CM

## 2017-02-10 DIAGNOSIS — D509 Iron deficiency anemia, unspecified: Secondary | ICD-10-CM | POA: Diagnosis not present

## 2017-02-10 DIAGNOSIS — C187 Malignant neoplasm of sigmoid colon: Secondary | ICD-10-CM

## 2017-02-10 DIAGNOSIS — C189 Malignant neoplasm of colon, unspecified: Secondary | ICD-10-CM

## 2017-02-10 LAB — COMPREHENSIVE METABOLIC PANEL
ALK PHOS: 90 U/L (ref 40–150)
ALT: 11 U/L (ref 0–55)
AST: 22 U/L (ref 5–34)
Albumin: 3.5 g/dL (ref 3.5–5.0)
Anion Gap: 9 mEq/L (ref 3–11)
BUN: 12.3 mg/dL (ref 7.0–26.0)
CALCIUM: 9.5 mg/dL (ref 8.4–10.4)
CO2: 25 meq/L (ref 22–29)
Chloride: 106 mEq/L (ref 98–109)
Creatinine: 1 mg/dL (ref 0.6–1.1)
EGFR: 58 mL/min/{1.73_m2} — ABNORMAL LOW (ref >90)
GLUCOSE: 110 mg/dL (ref 70–140)
POTASSIUM: 4.4 meq/L (ref 3.5–5.1)
Sodium: 140 mEq/L (ref 136–145)
Total Bilirubin: 0.53 mg/dL (ref 0.20–1.20)
Total Protein: 7.2 g/dL (ref 6.4–8.3)

## 2017-02-10 LAB — CBC WITH DIFFERENTIAL/PLATELET
BASO%: 2.6 % — ABNORMAL HIGH (ref 0.0–2.0)
Basophils Absolute: 0.1 10*3/uL (ref 0.0–0.1)
EOS%: 3.2 % (ref 0.0–7.0)
Eosinophils Absolute: 0.1 10*3/uL (ref 0.0–0.5)
HEMATOCRIT: 30 % — AB (ref 34.8–46.6)
HGB: 9.7 g/dL — ABNORMAL LOW (ref 11.6–15.9)
LYMPH#: 0.7 10*3/uL — AB (ref 0.9–3.3)
LYMPH%: 25.1 % (ref 14.0–49.7)
MCH: 26.6 pg (ref 25.1–34.0)
MCHC: 32.3 g/dL (ref 31.5–36.0)
MCV: 82.3 fL (ref 79.5–101.0)
MONO#: 0.3 10*3/uL (ref 0.1–0.9)
MONO%: 9.1 % (ref 0.0–14.0)
NEUT#: 1.8 10*3/uL (ref 1.5–6.5)
NEUT%: 60 % (ref 38.4–76.8)
Platelets: 354 10*3/uL (ref 145–400)
RBC: 3.65 10*6/uL — ABNORMAL LOW (ref 3.70–5.45)
RDW: 16.1 % — AB (ref 11.2–14.5)
WBC: 2.9 10*3/uL — AB (ref 3.9–10.3)

## 2017-02-10 LAB — CEA (IN HOUSE-CHCC): CEA (CHCC-In House): 2.94 ng/mL (ref 0.00–5.00)

## 2017-02-10 MED ORDER — CAPECITABINE 500 MG PO TABS: 1500.0000 mg | ORAL_TABLET | Freq: Two times a day (BID) | ORAL | 0 refills | Status: DC

## 2017-02-10 MED FILL — CAPECITABINE 500 MG TABLET: 500 | 21 days supply | Qty: 84 | Fill #0

## 2017-02-10 NOTE — Progress Notes (Addendum)
[facility] OFFICE PROGRESS NOTE   Diagnosis:  Colon cancer  INTERVAL HISTORY:   Ms. Foxworthy returns as scheduled. She continues to note erratic bowel habits. Stools intermittently loose. Abdomen is "sore". Appetite is better. No nausea or vomiting.  Objective:  Vital signs in last 24 hours:  Blood pressure 117/62, pulse 80, temperature 98.7 F (37.1 C), temperature source Oral, resp. rate 18, height '5\' 7"'  (1.702 m), weight 172 lb 3.2 oz (78.1 kg), SpO2 100 %.    HEENT: No thrush or ulcers. Resp: Lungs clear bilaterally. Cardio: Regular rate and rhythm. GI: Abdomen soft and nontender. No hepatomegaly. Very small superficial opening mid point of the midline scar. Incision is otherwise healed. Vascular: No leg edema.   Lab Results:  Lab Results  Component Value Date   WBC 2.9 (L) 02/10/2017   HGB 9.7 (L) 02/10/2017   HCT 30.0 (L) 02/10/2017   MCV 82.3 02/10/2017   PLT 354 02/10/2017   NEUTROABS 1.8 02/10/2017    Imaging:  No results found.  Medications: I have reviewed the patient's current medications.  Assessment/Plan: 1. Adenocarcinoma the distal sigmoid colon, poorly differentiated, stage II (T3 N0), status post a laparoscopic assisted sigmoid colectomy 08/09/2015  No loss of mismatch repair protein expression  Elevated preoperative CEA  Staging CT of the abdomen and pelvis 06/22/2015 with no evidence of distant metastatic disease  Mildly elevated CEA 02/23/2017And 04/17/2016  CTs chest, abdomen, and pelvis on 06/04/2016, 57m left upper lobe nodule-no comparison available, new hypodense lesion in the left liver, solid Right renal mass  MRI 06/10/2016-2 enhancing lesions in the liver consistent with metastatic disease, segment 2 and segment 5. Solid enhancing right renal lesion consistent with a renal cell carcinoma  Radiofrequency ablation of 2 liver lesions on 07/25/2016  Cycle 1 adjuvant Xeloda 08/18/2016  Cycle 2 Xeloda  09/08/2016  CEA improved 09/26/2016  Cycle 3 Xeloda held 09/29/2016 due to unexplained abdominal pain,referred for CT  CT abdomen/pelvis 09/30/2016 with a long segment of bowel wall thickening involving the distal ileum; lesion left hepatic lobe similar to slightly smaller following ablation; lesion posterior right hepatic lobe elongated favored to represent treatment tract.  Cycle 3 Xeloda 10/10/2016 (dose reduced due to drug induced enteritis following cycle 2)discontinued 10/15/2016 secondary to abdominal pain.  CT in while the emergency room with abdominal pain 10/15/2016 revealed descending colitis  Colonoscopy 10/30/2016 confirmed a mass at the: colo-colonicanastomosis  Cycle 4 Xeloda 11/03/2016  01/09/2017 status post a low anterior resection for removal of locally recurrent tumor at the sigmoid anastomosis, resection margins negative, tumor invades pericolonic soft tissue, 12 benign lymph nodes  Cycle 5 Xeloda 02/10/2017   2.History of Microcytic anemia-likely iron deficiency anemia secondary to colon cancer  3. Tubular villous adenoma on the sigmoid colon resection specimen 08/09/2015  Multiple tubular adenomas removed on a colonoscopy 11/16/2015  4. Right renal mass on CT 06/04/2016-suspicious for renal cell carcinoma      Disposition: Ms. BRozierappears stable. The plan is to resume Xeloda for an additional 4 cycles. We again reviewed potential toxicities. She understands to discontinue Xeloda and contact the office should she develop any of these. She will begin cycle 5 Xeloda 02/11/2017. We will see her in follow-up on 02/26/2017. She will contact the office in the interim with any problems.  Patient seen with Dr. SBenay Spice    TNed CardANP/GNP-BC   02/10/2017  12:03 PM  This was a shared visit with LNed Card Ms. BAmodeihas recovered from  the colon resection. She will resume adjuvant Xeloda.  Julieanne Manson, M.D.

## 2017-02-10 NOTE — Telephone Encounter (Signed)
Gave patient AVS and calender per 02/10/2017 los - unable to schedule with [facility] on 4/12 .

## 2017-02-26 ENCOUNTER — Other Ambulatory Visit (HOSPITAL_BASED_OUTPATIENT_CLINIC_OR_DEPARTMENT_OTHER): Payer: Medicare Other

## 2017-02-26 ENCOUNTER — Ambulatory Visit (HOSPITAL_BASED_OUTPATIENT_CLINIC_OR_DEPARTMENT_OTHER): Payer: Medicare Other | Admitting: Nurse Practitioner

## 2017-02-26 ENCOUNTER — Telehealth: Payer: Self-pay | Admitting: Nurse Practitioner

## 2017-02-26 VITALS — BP 111/66 | HR 81 | Temp 97.8°F | Resp 18 | Ht 67.0 in | Wt 169.3 lb

## 2017-02-26 DIAGNOSIS — C787 Secondary malignant neoplasm of liver and intrahepatic bile duct: Secondary | ICD-10-CM

## 2017-02-26 DIAGNOSIS — D709 Neutropenia, unspecified: Secondary | ICD-10-CM | POA: Diagnosis not present

## 2017-02-26 DIAGNOSIS — C189 Malignant neoplasm of colon, unspecified: Secondary | ICD-10-CM

## 2017-02-26 DIAGNOSIS — C187 Malignant neoplasm of sigmoid colon: Secondary | ICD-10-CM

## 2017-02-26 LAB — COMPREHENSIVE METABOLIC PANEL
ALBUMIN: 3.8 g/dL (ref 3.5–5.0)
ALK PHOS: 68 U/L (ref 40–150)
ALT: 16 U/L (ref 0–55)
ANION GAP: 9 meq/L (ref 3–11)
AST: 37 U/L — AB (ref 5–34)
BUN: 9.1 mg/dL (ref 7.0–26.0)
CALCIUM: 9.4 mg/dL (ref 8.4–10.4)
CO2: 24 mEq/L (ref 22–29)
CREATININE: 0.9 mg/dL (ref 0.6–1.1)
Chloride: 105 mEq/L (ref 98–109)
EGFR: 68 mL/min/{1.73_m2} — ABNORMAL LOW (ref >90)
Glucose: 127 mg/dl (ref 70–140)
Potassium: 4 mEq/L (ref 3.5–5.1)
Sodium: 139 mEq/L (ref 136–145)
Total Bilirubin: 0.74 mg/dL (ref 0.20–1.20)
Total Protein: 7.1 g/dL (ref 6.4–8.3)

## 2017-02-26 LAB — CBC WITH DIFFERENTIAL/PLATELET
BASO%: 0.3 % (ref 0.0–2.0)
Basophils Absolute: 0 10*3/uL (ref 0.0–0.1)
EOS%: 3.2 % (ref 0.0–7.0)
Eosinophils Absolute: 0.1 10*3/uL (ref 0.0–0.5)
HEMATOCRIT: 30.2 % — AB (ref 34.8–46.6)
HEMOGLOBIN: 9.8 g/dL — AB (ref 11.6–15.9)
LYMPH#: 0.8 10*3/uL — AB (ref 0.9–3.3)
LYMPH%: 44 % (ref 14.0–49.7)
MCH: 26.9 pg (ref 25.1–34.0)
MCHC: 32.5 g/dL (ref 31.5–36.0)
MCV: 82.6 fL (ref 79.5–101.0)
MONO#: 0.1 10*3/uL (ref 0.1–0.9)
MONO%: 6.2 % (ref 0.0–14.0)
NEUT#: 0.8 10*3/uL — ABNORMAL LOW (ref 1.5–6.5)
NEUT%: 46.3 % (ref 38.4–76.8)
PLATELETS: 262 10*3/uL (ref 145–400)
RBC: 3.65 10*6/uL — ABNORMAL LOW (ref 3.70–5.45)
RDW: 16.4 % — AB (ref 11.2–14.5)
WBC: 1.7 10*3/uL — ABNORMAL LOW (ref 3.9–10.3)

## 2017-02-26 MED ORDER — TRAMADOL HCL 50 MG PO TABS: 50.0000 mg | ORAL_TABLET | Freq: Four times a day (QID) | ORAL | 0 refills | Status: DC | PRN

## 2017-02-26 MED ORDER — CAPECITABINE 500 MG PO TABS: 1500.0000 mg | ORAL_TABLET | Freq: Two times a day (BID) | ORAL | 0 refills | Status: DC

## 2017-02-26 MED FILL — CAPECITABINE 500 MG TABLET: 500 | 14 days supply | Qty: 84 | Fill #0

## 2017-02-26 NOTE — Telephone Encounter (Signed)
Appointments scheduled per 4.12.18 LOS. Patient given AVS report and calendars with future scheduled appointments. °

## 2017-02-26 NOTE — Progress Notes (Signed)
[facility] OFFICE PROGRESS NOTE   Diagnosis:  Colon cancer  INTERVAL HISTORY:   Nichole Cross returns as scheduled. She completed cycle 5 Xeloda beginning 02/12/2017. She denies nausea/vomiting. No mouth sores. No change in baseline bowel habits which continue to be erratic. She denies abdominal pain. No hand or foot pain or redness.  Objective:  Vital signs in last 24 hours:  Blood pressure 111/66, pulse 81, temperature 97.8 F (36.6 C), temperature source Oral, resp. rate 18, height '5\' 7"'  (1.702 m), weight 169 lb 4.8 oz (76.8 kg), SpO2 100 %.    HEENT: No thrush or ulcers. Resp: Lungs clear bilaterally. Cardio: Regular rate and rhythm. GI: Abdomen soft and nontender. No hepatomegaly. Vascular: No leg edema. Calves soft and nontender.  Skin: Palms without erythema.    Lab Results:  Lab Results  Component Value Date   WBC 1.7 (L) 02/26/2017   HGB 9.8 (L) 02/26/2017   HCT 30.2 (L) 02/26/2017   MCV 82.6 02/26/2017   PLT 262 02/26/2017   NEUTROABS 0.8 (L) 02/26/2017    Imaging:  No results found.  Medications: I have reviewed the patient's current medications.  Assessment/Plan: 1. Adenocarcinoma the distal sigmoid colon, poorly differentiated, stage II (T3 N0), status post a laparoscopic assisted sigmoid colectomy 08/09/2015  No loss of mismatch repair protein expression  Elevated preoperative CEA  Staging CT of the abdomen and pelvis 06/22/2015 with no evidence of distant metastatic disease  Mildly elevated CEA 02/23/2017And 04/17/2016  CTs chest, abdomen, and pelvis on 06/04/2016, 56m left upper lobe nodule-no comparison available, new hypodense lesion in the left liver, solid Right renal mass  MRI 06/10/2016-2 enhancing lesions in the liver consistent with metastatic disease, segment 2 and segment 5. Solid enhancing right renal lesion consistent with a renal cell carcinoma  Radiofrequency ablation of 2 liver lesions on 07/25/2016  Cycle 1  adjuvant Xeloda 08/18/2016  Cycle 2 Xeloda 09/08/2016  CEA improved 09/26/2016  Cycle 3 Xeloda held 09/29/2016 due to unexplained abdominal pain,referred for CT  CT abdomen/pelvis 09/30/2016 with a long segment of bowel wall thickening involving the distal ileum; lesion left hepatic lobe similar to slightly smaller following ablation; lesion posterior right hepatic lobe elongated favored to represent treatment tract.  Cycle 3 Xeloda 10/10/2016 (dose reduced due to drug induced enteritis following cycle 2)discontinued 10/15/2016 secondary to abdominal pain.  CT in while the emergency room with abdominal pain 10/15/2016 revealed descending colitis  Colonoscopy 10/30/2016 confirmed a mass at the: colo-colonicanastomosis  Cycle 4 Xeloda 11/03/2016  01/09/2017 status post a low anterior resection for removal of locally recurrent tumor at the sigmoid anastomosis, resection margins negative, tumor invades pericolonic soft tissue, 12 benign lymph nodes  Cycle 5 Xeloda 02/12/2017  Cycle 6 Xeloda 03/05/2017 (pending adequate counts on CBC day prior)   2.History ofMicrocytic anemia-likely iron deficiency anemia secondary to colon cancer  3.Tubular villous adenoma on the sigmoid colon resection specimen 08/09/2015  Multiple tubular adenomas removed on a colonoscopy 11/16/2015  4. Right renal mass on CT 06/04/2016-suspicious for renal cell carcinoma  5.   Mild neutropenia on labs 02/26/2017    Disposition: Ms. BKundeappears stable. She has completed 5 cycles of Xeloda. She has mild neutropenia on labs today. She will return for a follow-up CBC on 03/04/2018 with plans to begin cycle 6 Xeloda the following day if counts are adequate. Neutropenic precautions were reviewed. She understands to contact the office with fever, chills, other signs of infection. She will return for a follow-up  visit in 3 weeks. She will contact the office in the interim with any problems.  She was  provided with a new prescription for tramadol at today's visit.  Plan reviewed with Dr. Benay Spice.  [name] ANP/GNP-BC   02/26/2017  11:16 AM

## 2017-03-04 ENCOUNTER — Other Ambulatory Visit: Payer: Self-pay | Admitting: *Deleted

## 2017-03-04 ENCOUNTER — Other Ambulatory Visit (HOSPITAL_BASED_OUTPATIENT_CLINIC_OR_DEPARTMENT_OTHER): Payer: Medicare Other

## 2017-03-04 ENCOUNTER — Telehealth: Payer: Self-pay | Admitting: *Deleted

## 2017-03-04 DIAGNOSIS — C187 Malignant neoplasm of sigmoid colon: Secondary | ICD-10-CM

## 2017-03-04 DIAGNOSIS — C787 Secondary malignant neoplasm of liver and intrahepatic bile duct: Secondary | ICD-10-CM | POA: Diagnosis not present

## 2017-03-04 DIAGNOSIS — C189 Malignant neoplasm of colon, unspecified: Secondary | ICD-10-CM

## 2017-03-04 LAB — CBC WITH DIFFERENTIAL/PLATELET
BASO%: 1.4 % (ref 0.0–2.0)
Basophils Absolute: 0 10*3/uL (ref 0.0–0.1)
EOS%: 4.1 % (ref 0.0–7.0)
Eosinophils Absolute: 0.1 10*3/uL (ref 0.0–0.5)
HEMATOCRIT: 31.3 % — AB (ref 34.8–46.6)
HGB: 9.9 g/dL — ABNORMAL LOW (ref 11.6–15.9)
LYMPH#: 0.9 10*3/uL (ref 0.9–3.3)
LYMPH%: 40.8 % (ref 14.0–49.7)
MCH: 26.1 pg (ref 25.1–34.0)
MCHC: 31.6 g/dL (ref 31.5–36.0)
MCV: 82.4 fL (ref 79.5–101.0)
MONO#: 0.4 10*3/uL (ref 0.1–0.9)
MONO%: 16.1 % — ABNORMAL HIGH (ref 0.0–14.0)
NEUT%: 37.6 % — AB (ref 38.4–76.8)
NEUTROS ABS: 0.8 10*3/uL — AB (ref 1.5–6.5)
PLATELETS: 230 10*3/uL (ref 145–400)
RBC: 3.8 10*6/uL (ref 3.70–5.45)
RDW: 18.3 % — ABNORMAL HIGH (ref 11.2–14.5)
WBC: 2.2 10*3/uL — AB (ref 3.9–10.3)

## 2017-03-04 NOTE — Telephone Encounter (Signed)
Call placed to patient to inform her per L. Thomas NP to continue to hold Xeloda, labs will be repeated on 03/11/17 and that we will move appts from 03/19/17 to 03/26/17.  Teach back done. Patient appreciative of call and has no questions at this time.

## 2017-03-05 ENCOUNTER — Telehealth: Payer: Self-pay | Admitting: Oncology

## 2017-03-05 NOTE — Telephone Encounter (Signed)
sw pt to confirm 4/25 appts and r/s appts to 5/10 per LOS

## 2017-03-11 ENCOUNTER — Other Ambulatory Visit: Payer: Self-pay | Admitting: *Deleted

## 2017-03-11 ENCOUNTER — Other Ambulatory Visit (HOSPITAL_BASED_OUTPATIENT_CLINIC_OR_DEPARTMENT_OTHER): Payer: Medicare Other

## 2017-03-11 ENCOUNTER — Telehealth: Payer: Self-pay | Admitting: *Deleted

## 2017-03-11 DIAGNOSIS — C187 Malignant neoplasm of sigmoid colon: Secondary | ICD-10-CM

## 2017-03-11 DIAGNOSIS — C189 Malignant neoplasm of colon, unspecified: Secondary | ICD-10-CM

## 2017-03-11 DIAGNOSIS — C20 Malignant neoplasm of rectum: Secondary | ICD-10-CM

## 2017-03-11 LAB — CBC WITH DIFFERENTIAL/PLATELET
BASO%: 2.1 % — ABNORMAL HIGH (ref 0.0–2.0)
BASOS ABS: 0.1 10*3/uL (ref 0.0–0.1)
EOS ABS: 0.1 10*3/uL (ref 0.0–0.5)
EOS%: 2.7 % (ref 0.0–7.0)
HCT: 28.7 % — ABNORMAL LOW (ref 34.8–46.6)
HGB: 9.1 g/dL — ABNORMAL LOW (ref 11.6–15.9)
LYMPH%: 39.9 % (ref 14.0–49.7)
MCH: 25.6 pg (ref 25.1–34.0)
MCHC: 31.7 g/dL (ref 31.5–36.0)
MCV: 80.7 fL (ref 79.5–101.0)
MONO#: 0.3 10*3/uL (ref 0.1–0.9)
MONO%: 11 % (ref 0.0–14.0)
NEUT#: 1.1 10*3/uL — ABNORMAL LOW (ref 1.5–6.5)
NEUT%: 44.3 % (ref 38.4–76.8)
PLATELETS: 189 10*3/uL (ref 145–400)
RBC: 3.55 10*6/uL — ABNORMAL LOW (ref 3.70–5.45)
RDW: 17.5 % — ABNORMAL HIGH (ref 11.2–14.5)
WBC: 2.6 10*3/uL — ABNORMAL LOW (ref 3.9–10.3)
lymph#: 1 10*3/uL (ref 0.9–3.3)

## 2017-03-11 NOTE — Telephone Encounter (Signed)
Per Benay Spice, MD notified patient WBC is low, hold Xeloda, and repeat CBC on 03/17/17. Patient verbalized understanding states "she prefers a morning appointment for lab." Message sent to scheduling for lab appointment.

## 2017-03-12 ENCOUNTER — Telehealth: Payer: Self-pay | Admitting: Oncology

## 2017-03-12 NOTE — Telephone Encounter (Signed)
Called patient to inform her of next scheduled appointment for 5/1.

## 2017-03-17 ENCOUNTER — Other Ambulatory Visit (HOSPITAL_BASED_OUTPATIENT_CLINIC_OR_DEPARTMENT_OTHER): Payer: Medicare Other

## 2017-03-17 DIAGNOSIS — C187 Malignant neoplasm of sigmoid colon: Secondary | ICD-10-CM | POA: Diagnosis not present

## 2017-03-17 DIAGNOSIS — C20 Malignant neoplasm of rectum: Secondary | ICD-10-CM

## 2017-03-17 LAB — CBC WITH DIFFERENTIAL/PLATELET
BASO%: 3.4 % — ABNORMAL HIGH (ref 0.0–2.0)
Basophils Absolute: 0.1 10*3/uL (ref 0.0–0.1)
EOS ABS: 0.1 10*3/uL (ref 0.0–0.5)
EOS%: 3.6 % (ref 0.0–7.0)
HCT: 29.7 % — ABNORMAL LOW (ref 34.8–46.6)
HEMOGLOBIN: 9.5 g/dL — AB (ref 11.6–15.9)
LYMPH%: 28.9 % (ref 14.0–49.7)
MCH: 25.8 pg (ref 25.1–34.0)
MCHC: 31.9 g/dL (ref 31.5–36.0)
MCV: 80.9 fL (ref 79.5–101.0)
MONO#: 0.4 10*3/uL (ref 0.1–0.9)
MONO%: 14 % (ref 0.0–14.0)
NEUT%: 50.1 % (ref 38.4–76.8)
NEUTROS ABS: 1.4 10*3/uL — AB (ref 1.5–6.5)
PLATELETS: 195 10*3/uL (ref 145–400)
RBC: 3.68 10*6/uL — ABNORMAL LOW (ref 3.70–5.45)
RDW: 18.5 % — AB (ref 11.2–14.5)
WBC: 2.7 10*3/uL — AB (ref 3.9–10.3)
lymph#: 0.8 10*3/uL — ABNORMAL LOW (ref 0.9–3.3)

## 2017-03-18 ENCOUNTER — Telehealth: Payer: Self-pay | Admitting: *Deleted

## 2017-03-18 DIAGNOSIS — C189 Malignant neoplasm of colon, unspecified: Secondary | ICD-10-CM

## 2017-03-18 DIAGNOSIS — C787 Secondary malignant neoplasm of liver and intrahepatic bile duct: Principal | ICD-10-CM

## 2017-03-18 MED ORDER — CAPECITABINE 500 MG PO TABS
1000.0000 mg | ORAL_TABLET | Freq: Two times a day (BID) | ORAL | 0 refills | Status: DC
Start: 2017-03-18 — End: 2017-03-26

## 2017-03-18 NOTE — Telephone Encounter (Signed)
-----   Message from Ladell Pier, MD sent at 03/17/2017  6:01 PM EDT ----- Please call patient, white count is better, resume xeloda, reduce dose to 1000mg  twice daily

## 2017-03-18 NOTE — Telephone Encounter (Signed)
Per Dr. Benay Spice, patient notified that white count is better and to resume Xeloda at reduced dose of 1000 mg twice a day.  Patient appreciative of call and has no questions at this time.

## 2017-03-19 ENCOUNTER — Ambulatory Visit: Payer: Medicare Other | Admitting: Oncology

## 2017-03-19 ENCOUNTER — Other Ambulatory Visit: Payer: Medicare Other

## 2017-03-26 ENCOUNTER — Other Ambulatory Visit: Payer: Self-pay | Admitting: *Deleted

## 2017-03-26 ENCOUNTER — Telehealth: Payer: Self-pay | Admitting: Oncology

## 2017-03-26 ENCOUNTER — Ambulatory Visit (HOSPITAL_BASED_OUTPATIENT_CLINIC_OR_DEPARTMENT_OTHER): Payer: Medicare Other | Admitting: Oncology

## 2017-03-26 ENCOUNTER — Other Ambulatory Visit (HOSPITAL_BASED_OUTPATIENT_CLINIC_OR_DEPARTMENT_OTHER): Payer: Medicare Other

## 2017-03-26 VITALS — BP 119/68 | HR 64 | Temp 97.7°F | Resp 18 | Ht 67.0 in | Wt 167.2 lb

## 2017-03-26 DIAGNOSIS — C787 Secondary malignant neoplasm of liver and intrahepatic bile duct: Secondary | ICD-10-CM

## 2017-03-26 DIAGNOSIS — D701 Agranulocytosis secondary to cancer chemotherapy: Secondary | ICD-10-CM

## 2017-03-26 DIAGNOSIS — D630 Anemia in neoplastic disease: Secondary | ICD-10-CM

## 2017-03-26 DIAGNOSIS — C189 Malignant neoplasm of colon, unspecified: Secondary | ICD-10-CM

## 2017-03-26 DIAGNOSIS — C187 Malignant neoplasm of sigmoid colon: Secondary | ICD-10-CM

## 2017-03-26 LAB — COMPREHENSIVE METABOLIC PANEL
ALBUMIN: 4 g/dL (ref 3.5–5.0)
ALT: 20 U/L (ref 0–55)
AST: 42 U/L — AB (ref 5–34)
Alkaline Phosphatase: 92 U/L (ref 40–150)
Anion Gap: 8 mEq/L (ref 3–11)
BUN: 19.8 mg/dL (ref 7.0–26.0)
CO2: 22 mEq/L (ref 22–29)
CREATININE: 1 mg/dL (ref 0.6–1.1)
Calcium: 9.2 mg/dL (ref 8.4–10.4)
Chloride: 108 mEq/L (ref 98–109)
EGFR: 57 mL/min/{1.73_m2} — ABNORMAL LOW (ref >90)
GLUCOSE: 97 mg/dL (ref 70–140)
POTASSIUM: 3.4 meq/L — AB (ref 3.5–5.1)
SODIUM: 138 meq/L (ref 136–145)
Total Bilirubin: 0.54 mg/dL (ref 0.20–1.20)
Total Protein: 7.4 g/dL (ref 6.4–8.3)

## 2017-03-26 LAB — CBC WITH DIFFERENTIAL/PLATELET
BASO%: 1 % (ref 0.0–2.0)
BASOS ABS: 0 10*3/uL (ref 0.0–0.1)
EOS%: 3.1 % (ref 0.0–7.0)
Eosinophils Absolute: 0.1 10*3/uL (ref 0.0–0.5)
HEMATOCRIT: 31 % — AB (ref 34.8–46.6)
HGB: 9.5 g/dL — ABNORMAL LOW (ref 11.6–15.9)
LYMPH%: 32.5 % (ref 14.0–49.7)
MCH: 25.4 pg (ref 25.1–34.0)
MCHC: 30.6 g/dL — ABNORMAL LOW (ref 31.5–36.0)
MCV: 82.9 fL (ref 79.5–101.0)
MONO#: 0.3 10*3/uL (ref 0.1–0.9)
MONO%: 13.4 % (ref 0.0–14.0)
NEUT#: 1 10*3/uL — ABNORMAL LOW (ref 1.5–6.5)
NEUT%: 50 % (ref 38.4–76.8)
Platelets: 237 10*3/uL (ref 145–400)
RBC: 3.74 10*6/uL (ref 3.70–5.45)
RDW: 17.5 % — ABNORMAL HIGH (ref 11.2–14.5)
WBC: 1.9 10*3/uL — AB (ref 3.9–10.3)
lymph#: 0.6 10*3/uL — ABNORMAL LOW (ref 0.9–3.3)

## 2017-03-26 LAB — CEA (IN HOUSE-CHCC): CEA (CHCC-In House): 2.06 ng/mL (ref 0.00–5.00)

## 2017-03-26 MED ORDER — CAPECITABINE 500 MG PO TABS: 1000.0000 mg | ORAL_TABLET | Freq: Two times a day (BID) | ORAL | 0 refills | Status: DC

## 2017-03-26 MED FILL — CAPECITABINE 500 MG TABLET: 500 | 30 days supply | Qty: 120 | Fill #0

## 2017-03-26 NOTE — Telephone Encounter (Signed)
Appointments scheduled per 03/26/17 los. Patient was given a copy of the AVS report and appointment schedule per 03/26/17 los. °

## 2017-03-26 NOTE — Progress Notes (Signed)
[facility] OFFICE PROGRESS NOTE   Diagnosis: Colon cancer  INTERVAL HISTORY:   Ms.Nolde returns as scheduled. She started another cycle of Xeloda on 03/12/2017 (we recommended not starting the Xeloda that day). She decrease the Xeloda dose to 1000 g twice daily on 03/18/2017. No mouth sores or hand/foot pain. She has returned to work. She complains of insomnia. She has occasional diarrhea, but this does not occur on a daily basis.  Objective:  Vital signs in last 24 hours:  Blood pressure 119/68, pulse 64, temperature 97.7 F (36.5 C), temperature source Oral, resp. rate 18, height '5\' 7"'  (1.702 m), weight 167 lb 3.2 oz (75.8 kg), SpO2 100 %.    HEENT:  no thrush or ulcers Resp:  lungs clear bilaterally Cardio:  regular rate and rhythm GI:  no hepatosplenomegaly, nontender Vascular:  no leg edema  skin: Palms and soles without erythema     Lab Results:  Lab Results  Component Value Date   WBC 1.9 (L) 03/26/2017   HGB 9.5 (L) 03/26/2017   HCT 31.0 (L) 03/26/2017   MCV 82.9 03/26/2017   PLT 237 03/26/2017   NEUTROABS 1.0 (L) 03/26/2017    CMP     Component Value Date/Time   NA 138 03/26/2017 0824   K 3.4 (L) 03/26/2017 0824   CL 102 01/12/2017 0438   CO2 22 03/26/2017 0824   GLUCOSE 97 03/26/2017 0824   BUN 19.8 03/26/2017 0824   CREATININE 1.0 03/26/2017 0824   CALCIUM 9.2 03/26/2017 0824   PROT 7.4 03/26/2017 0824   ALBUMIN 4.0 03/26/2017 0824   AST 42 (H) 03/26/2017 0824   ALT 20 03/26/2017 0824   ALKPHOS 92 03/26/2017 0824   BILITOT 0.54 03/26/2017 0824   GFRNONAA >60 01/12/2017 0438   GFRAA >60 01/12/2017 0438    Lab Results  Component Value Date   CEA1 2.06 03/26/2017     Medications: I have reviewed the patient's current medications.  Assessment/Plan: 1. Adenocarcinoma the distal sigmoid colon, poorly differentiated, stage II (T3 N0), status post a laparoscopic assisted sigmoid colectomy 08/09/2015  No loss of mismatch repair  protein expression  Elevated preoperative CEA  Staging CT of the abdomen and pelvis 06/22/2015 with no evidence of distant metastatic disease  Mildly elevated CEA 02/23/2017And 04/17/2016  CTs chest, abdomen, and pelvis on 06/04/2016, 78m left upper lobe nodule-no comparison available, new hypodense lesion in the left liver, solid Right renal mass  MRI 06/10/2016-2 enhancing lesions in the liver consistent with metastatic disease, segment 2 and segment 5. Solid enhancing right renal lesion consistent with a renal cell carcinoma  Radiofrequency ablation of 2 liver lesions on 07/25/2016  Cycle 1 adjuvant Xeloda 08/18/2016  Cycle 2 Xeloda 09/08/2016  CEA improved 09/26/2016  Cycle 3 Xeloda held 09/29/2016 due to unexplained abdominal pain,referred for CT  CT abdomen/pelvis 09/30/2016 with a long segment of bowel wall thickening involving the distal ileum; lesion left hepatic lobe similar to slightly smaller following ablation; lesion posterior right hepatic lobe elongated favored to represent treatment tract.  Cycle 3 Xeloda 10/10/2016 (dose reduced due to drug induced enteritis following cycle 2)discontinued 10/15/2016 secondary to abdominal pain.  CT in while the emergency room with abdominal pain 10/15/2016 revealed descending colitis  Colonoscopy 10/30/2016 confirmed a mass at the: colo-colonicanastomosis  Cycle 4 Xeloda 11/03/2016  01/09/2017 status post a low anterior resection for removal of locally recurrent tumor at the sigmoid anastomosis, resection margins negative, tumor invades pericolonic soft tissue, 12 benign lymph nodes  Cycle 5 Xeloda 02/12/2017  Cycle 6 Xeloda 03/12/2017, Xeloda dose reduced to 1000 mg twice daily on 03/18/2017   2.History ofMicrocytic anemia-likely iron deficiency anemia secondary to colon cancer, persistent anemia likely secondary to chemotherapy  3.Tubular villous adenoma on the sigmoid colon resection specimen  08/09/2015  Multiple tubular adenomas removed on a colonoscopy 11/16/2015  4. Right renal mass on CT 06/04/2016-suspicious for renal cell carcinoma  5.    neutropenia secondary to chemotherapy    Disposition:  She appears well. She has completed 6 cycles of adjuvant Xeloda. The plan is to complete a total of 8 cycles of adjuvant therapy. Ms. Gildner has neutropenia and persistent anemia secondary to Xeloda. She will not restart Xeloda until she returns for a CBC on 04/02/2017.  She will be scheduled for an office visit on 04/22/2017.  The CEA is now normal. We will schedule an MRI of the abdomen at the completion of adjuvant chemotherapy.  25 minutes were spent with the patient today. The majority of the time was used for counseling and coordination of care.  [name], MD  03/26/2017  10:04 AM

## 2017-04-02 ENCOUNTER — Other Ambulatory Visit (HOSPITAL_BASED_OUTPATIENT_CLINIC_OR_DEPARTMENT_OTHER): Payer: Medicare Other

## 2017-04-02 DIAGNOSIS — C187 Malignant neoplasm of sigmoid colon: Secondary | ICD-10-CM | POA: Diagnosis not present

## 2017-04-02 DIAGNOSIS — C189 Malignant neoplasm of colon, unspecified: Secondary | ICD-10-CM

## 2017-04-02 LAB — CBC WITH DIFFERENTIAL/PLATELET
BASO%: 2 % (ref 0.0–2.0)
Basophils Absolute: 0 10*3/uL (ref 0.0–0.1)
EOS ABS: 0.1 10*3/uL (ref 0.0–0.5)
EOS%: 2.6 % (ref 0.0–7.0)
HEMATOCRIT: 30.1 % — AB (ref 34.8–46.6)
HGB: 9.6 g/dL — ABNORMAL LOW (ref 11.6–15.9)
LYMPH#: 0.8 10*3/uL — AB (ref 0.9–3.3)
LYMPH%: 38.3 % (ref 14.0–49.7)
MCH: 25.3 pg (ref 25.1–34.0)
MCHC: 31.8 g/dL (ref 31.5–36.0)
MCV: 79.7 fL (ref 79.5–101.0)
MONO#: 0.3 10*3/uL (ref 0.1–0.9)
MONO%: 13.5 % (ref 0.0–14.0)
NEUT%: 43.6 % (ref 38.4–76.8)
NEUTROS ABS: 1 10*3/uL — AB (ref 1.5–6.5)
PLATELETS: 275 10*3/uL (ref 145–400)
RBC: 3.77 10*6/uL (ref 3.70–5.45)
RDW: 19.5 % — ABNORMAL HIGH (ref 11.2–14.5)
WBC: 2.2 10*3/uL — AB (ref 3.9–10.3)

## 2017-04-03 ENCOUNTER — Telehealth: Payer: Self-pay | Admitting: *Deleted

## 2017-04-03 DIAGNOSIS — C187 Malignant neoplasm of sigmoid colon: Secondary | ICD-10-CM

## 2017-04-03 NOTE — Telephone Encounter (Signed)
Called pt with instructions to HOLD Xeloda, per Dr. Gearldine Shown note below. Return 5/23 for lab. She voiced understanding. Appt given for 12PM.

## 2017-04-03 NOTE — Telephone Encounter (Signed)
-----   Message from Ladell Pier, MD sent at 04/02/2017  9:24 PM EDT ----- Please call patient, HOLD xeloda, repeat cbc 5/23, will start xeloda if anc higher

## 2017-04-08 ENCOUNTER — Other Ambulatory Visit (HOSPITAL_BASED_OUTPATIENT_CLINIC_OR_DEPARTMENT_OTHER): Payer: Medicare Other

## 2017-04-08 DIAGNOSIS — C187 Malignant neoplasm of sigmoid colon: Secondary | ICD-10-CM

## 2017-04-08 LAB — CBC WITH DIFFERENTIAL/PLATELET
BASO%: 1.8 % (ref 0.0–2.0)
Basophils Absolute: 0.1 10*3/uL (ref 0.0–0.1)
EOS%: 2.7 % (ref 0.0–7.0)
Eosinophils Absolute: 0.1 10*3/uL (ref 0.0–0.5)
HEMATOCRIT: 31.5 % — AB (ref 34.8–46.6)
HGB: 9.9 g/dL — ABNORMAL LOW (ref 11.6–15.9)
LYMPH#: 0.9 10*3/uL (ref 0.9–3.3)
LYMPH%: 28.1 % (ref 14.0–49.7)
MCH: 24.4 pg — ABNORMAL LOW (ref 25.1–34.0)
MCHC: 31.3 g/dL — ABNORMAL LOW (ref 31.5–36.0)
MCV: 77.9 fL — ABNORMAL LOW (ref 79.5–101.0)
MONO#: 0.3 10*3/uL (ref 0.1–0.9)
MONO%: 10.4 % (ref 0.0–14.0)
NEUT%: 57 % (ref 38.4–76.8)
NEUTROS ABS: 1.8 10*3/uL (ref 1.5–6.5)
PLATELETS: 224 10*3/uL (ref 145–400)
RBC: 4.04 10*6/uL (ref 3.70–5.45)
RDW: 19.1 % — ABNORMAL HIGH (ref 11.2–14.5)
WBC: 3.2 10*3/uL — AB (ref 3.9–10.3)

## 2017-04-09 ENCOUNTER — Telehealth: Payer: Self-pay | Admitting: *Deleted

## 2017-04-09 NOTE — Telephone Encounter (Signed)
-----   Message from Ladell Pier, MD sent at 04/08/2017  8:35 PM EDT ----- Please call patient, neutrophil count is higher, ok to start next cycle of xeloda

## 2017-04-09 NOTE — Telephone Encounter (Signed)
Message left for patient to call [place] back for MD instructions.  Instructions for patient are that "Neutrophil count is higher and it is ok to start next cycle of Xeloda."

## 2017-04-09 NOTE — Telephone Encounter (Signed)
Message received from patient requesting call back.  Patient instructed that per order of Dr. Benay Spice that neutrophil count is higher and it is ok to start next cycle of Xeloda.  Patient appreciative of call and has no questions at this time.

## 2017-04-22 ENCOUNTER — Ambulatory Visit (HOSPITAL_BASED_OUTPATIENT_CLINIC_OR_DEPARTMENT_OTHER): Payer: Medicare Other | Admitting: Nurse Practitioner

## 2017-04-22 ENCOUNTER — Telehealth: Payer: Self-pay | Admitting: Oncology

## 2017-04-22 ENCOUNTER — Other Ambulatory Visit (HOSPITAL_BASED_OUTPATIENT_CLINIC_OR_DEPARTMENT_OTHER): Payer: Medicare Other

## 2017-04-22 VITALS — BP 120/69 | HR 65 | Temp 98.4°F | Resp 18 | Ht 67.0 in | Wt 166.8 lb

## 2017-04-22 DIAGNOSIS — C787 Secondary malignant neoplasm of liver and intrahepatic bile duct: Secondary | ICD-10-CM | POA: Diagnosis not present

## 2017-04-22 DIAGNOSIS — C189 Malignant neoplasm of colon, unspecified: Secondary | ICD-10-CM

## 2017-04-22 DIAGNOSIS — C187 Malignant neoplasm of sigmoid colon: Secondary | ICD-10-CM

## 2017-04-22 DIAGNOSIS — D630 Anemia in neoplastic disease: Secondary | ICD-10-CM

## 2017-04-22 DIAGNOSIS — D701 Agranulocytosis secondary to cancer chemotherapy: Secondary | ICD-10-CM

## 2017-04-22 DIAGNOSIS — C20 Malignant neoplasm of rectum: Secondary | ICD-10-CM

## 2017-04-22 LAB — COMPREHENSIVE METABOLIC PANEL
ALT: 9 U/L (ref 0–55)
ANION GAP: 7 meq/L (ref 3–11)
AST: 25 U/L (ref 5–34)
Albumin: 4.1 g/dL (ref 3.5–5.0)
Alkaline Phosphatase: 78 U/L (ref 40–150)
BUN: 13.9 mg/dL (ref 7.0–26.0)
CHLORIDE: 107 meq/L (ref 98–109)
CO2: 25 meq/L (ref 22–29)
CREATININE: 1.1 mg/dL (ref 0.6–1.1)
Calcium: 9.8 mg/dL (ref 8.4–10.4)
EGFR: 49 mL/min/{1.73_m2} — ABNORMAL LOW (ref >90)
GLUCOSE: 88 mg/dL (ref 70–140)
Potassium: 5 mEq/L (ref 3.5–5.1)
SODIUM: 139 meq/L (ref 136–145)
TOTAL PROTEIN: 7.6 g/dL (ref 6.4–8.3)
Total Bilirubin: 0.42 mg/dL (ref 0.20–1.20)

## 2017-04-22 LAB — CBC WITH DIFFERENTIAL/PLATELET
BASO%: 1.7 % (ref 0.0–2.0)
Basophils Absolute: 0 10*3/uL (ref 0.0–0.1)
EOS%: 2.6 % (ref 0.0–7.0)
Eosinophils Absolute: 0.1 10*3/uL (ref 0.0–0.5)
HCT: 33.7 % — ABNORMAL LOW (ref 34.8–46.6)
HGB: 10.5 g/dL — ABNORMAL LOW (ref 11.6–15.9)
LYMPH%: 42 % (ref 14.0–49.7)
MCH: 24.4 pg — ABNORMAL LOW (ref 25.1–34.0)
MCHC: 31.3 g/dL — ABNORMAL LOW (ref 31.5–36.0)
MCV: 78.1 fL — ABNORMAL LOW (ref 79.5–101.0)
MONO#: 0.3 10*3/uL (ref 0.1–0.9)
MONO%: 11.8 % (ref 0.0–14.0)
NEUT%: 41.9 % (ref 38.4–76.8)
NEUTROS ABS: 1 10*3/uL — AB (ref 1.5–6.5)
PLATELETS: 256 10*3/uL (ref 145–400)
RBC: 4.31 10*6/uL (ref 3.70–5.45)
RDW: 21.7 % — ABNORMAL HIGH (ref 11.2–14.5)
WBC: 2.4 10*3/uL — AB (ref 3.9–10.3)
lymph#: 1 10*3/uL (ref 0.9–3.3)

## 2017-04-22 MED ORDER — TRAMADOL HCL 50 MG PO TABS: 50.0000 mg | ORAL_TABLET | Freq: Four times a day (QID) | ORAL | 0 refills | Status: DC | PRN

## 2017-04-22 MED ORDER — DIAZEPAM 2 MG PO TABS: 2.0000 mg | ORAL_TABLET | Freq: Every evening | ORAL | 0 refills | Status: DC | PRN

## 2017-04-22 NOTE — Progress Notes (Signed)
[facility] OFFICE PROGRESS NOTE   Diagnosis:  Colon cancer  INTERVAL HISTORY:   Ms. Massengale returns as scheduled. She began another cycle of Xeloda on 04/09/2017. She denies nausea/vomiting. No mouth sores. She has periodic loose stools which she feels to likely be diet related. She takes Imodium occasionally. No hand or foot pain or redness. No abdominal pain. She continues to have difficulty sleeping. She has tried Benadryl. Ambien was not covered by her insurance. She takes Ultram occasionally for knee pain.  Objective:  Vital signs in last 24 hours:  Blood pressure 120/69, pulse 65, temperature 98.4 F (36.9 C), temperature source Oral, resp. rate 18, height _0  (1.702 m), weight 166 lb 12.8 oz (75.7 kg), SpO2 100 %.    HEENT: No thrush or ulcers. Resp: Lungs clear bilaterally. Cardio: Regular rate and rhythm. GI: Abdomen soft and nontender. No hepatomegaly. Vascular: No leg edema. Skin: Palms without erythema.    Lab Results:  Lab Results  Component Value Date   WBC 2.4 (L) 04/22/2017   HGB 10.5 (L) 04/22/2017   HCT 33.7 (L) 04/22/2017   MCV 78.1 (L) 04/22/2017   PLT 256 04/22/2017   NEUTROABS 1.0 (L) 04/22/2017    Imaging:  No results found.  Medications: I have reviewed the patient's current medications.  Assessment/Plan: 1. Adenocarcinoma the distal sigmoid colon, poorly differentiated, stage II (T3 N0), status post a laparoscopic assisted sigmoid colectomy 08/09/2015  No loss of mismatch repair protein expression  Elevated preoperative CEA  Staging CT of the abdomen and pelvis 06/22/2015 with no evidence of distant metastatic disease  Mildly elevated CEA 02/23/2017And 04/17/2016  CTs chest, abdomen, and pelvis on 06/04/2016, 25m left upper lobe nodule-no comparison available, new hypodense lesion in the left liver, solid Right renal mass  MRI 06/10/2016-2 enhancing lesions in the liver consistent with metastatic disease, segment 2  and segment 5. Solid enhancing right renal lesion consistent with a renal cell carcinoma  Radiofrequency ablation of 2 liver lesions on 07/25/2016  Cycle 1 adjuvant Xeloda 08/18/2016  Cycle 2 Xeloda 09/08/2016  CEA improved 09/26/2016  Cycle 3 Xeloda held 09/29/2016 due to unexplained abdominal pain,referred for CT  CT abdomen/pelvis 09/30/2016 with a long segment of bowel wall thickening involving the distal ileum; lesion left hepatic lobe similar to slightly smaller following ablation; lesion posterior right hepatic lobe elongated favored to represent treatment tract.  Cycle 3 Xeloda 10/10/2016 (dose reduced due to drug induced enteritis following cycle 2)discontinued 10/15/2016 secondary to abdominal pain.  CT in while the emergency room with abdominal pain 10/15/2016 revealed descending colitis  Colonoscopy 10/30/2016 confirmed a mass at the: colo-colonicanastomosis  Cycle 4 Xeloda 11/03/2016  01/09/2017 status post a low anterior resection for removal of locally recurrent tumor at the sigmoid anastomosis, resection margins negative, tumor invades pericolonic soft tissue, 12 benign lymph nodes  Cycle 5 Xeloda 02/12/2017  Cycle 6 Xeloda 03/12/2017, Xeloda dose reduced to 1000 mg twice daily on 03/18/2017  Cycle 7 Xeloda 04/09/2017   2.History ofMicrocytic anemia-likely iron deficiency anemia secondary to colon cancer, persistent anemia likely secondary to chemotherapy  3.Tubular villous adenoma on the sigmoid colon resection specimen 08/09/2015  Multiple tubular adenomas removed on a colonoscopy 11/16/2015  4. Right renal mass on CT 06/04/2016-suspicious for renal cell carcinoma  5.  neutropenia secondary to chemotherapy    Disposition: Ms. BSegovianoappears stable. She is completing cycle 7 Xeloda. She takes the final dose of this cycle this evening. We reviewed today's labs. She understands  she has mild neutropenia. We reviewed neutropenic precautions.  She will contact the office with fever, chills, other signs of infection. She will return for a follow-up CBC on 04/29/2017. If the neutrophil count is adequate the plan will be to proceed with the eighth and final cycle of Xeloda beginning 04/30/2017.  For sleep she will try Valium 2 mg at bedtime as needed.  She takes tramadol periodically for right knee pain. She was provided with a new prescription at today's visit.  We scheduled a return visit on 05/21/2017. She will contact the office in the interim as outlined above or with any other problems.  Plan reviewed with Dr. Benay Spice.  [name] ANP/GNP-BC   04/22/2017  10:58 AM

## 2017-04-22 NOTE — Telephone Encounter (Signed)
Appointments scheduled per 04/22/17 los.  Patient was given a copy of the AVS report and appointment schedule, per 04/22/17 los.

## 2017-04-29 ENCOUNTER — Telehealth: Payer: Self-pay | Admitting: Oncology

## 2017-04-29 ENCOUNTER — Telehealth: Payer: Self-pay | Admitting: *Deleted

## 2017-04-29 ENCOUNTER — Other Ambulatory Visit (HOSPITAL_BASED_OUTPATIENT_CLINIC_OR_DEPARTMENT_OTHER): Payer: Medicare Other

## 2017-04-29 DIAGNOSIS — C187 Malignant neoplasm of sigmoid colon: Secondary | ICD-10-CM | POA: Diagnosis not present

## 2017-04-29 DIAGNOSIS — C189 Malignant neoplasm of colon, unspecified: Secondary | ICD-10-CM

## 2017-04-29 DIAGNOSIS — C20 Malignant neoplasm of rectum: Secondary | ICD-10-CM

## 2017-04-29 DIAGNOSIS — C787 Secondary malignant neoplasm of liver and intrahepatic bile duct: Secondary | ICD-10-CM

## 2017-04-29 LAB — CBC WITH DIFFERENTIAL/PLATELET
BASO%: 1.8 % (ref 0.0–2.0)
Basophils Absolute: 0 10*3/uL (ref 0.0–0.1)
EOS%: 2.3 % (ref 0.0–7.0)
Eosinophils Absolute: 0.1 10*3/uL (ref 0.0–0.5)
HCT: 30.5 % — ABNORMAL LOW (ref 34.8–46.6)
HGB: 9.5 g/dL — ABNORMAL LOW (ref 11.6–15.9)
LYMPH%: 41.9 % (ref 14.0–49.7)
MCH: 24.5 pg — ABNORMAL LOW (ref 25.1–34.0)
MCHC: 31.1 g/dL — AB (ref 31.5–36.0)
MCV: 78.8 fL — ABNORMAL LOW (ref 79.5–101.0)
MONO#: 0.3 10*3/uL (ref 0.1–0.9)
MONO%: 13.4 % (ref 0.0–14.0)
NEUT%: 40.6 % (ref 38.4–76.8)
NEUTROS ABS: 0.9 10*3/uL — AB (ref 1.5–6.5)
Platelets: 238 10*3/uL (ref 145–400)
RBC: 3.87 10*6/uL (ref 3.70–5.45)
RDW: 19.7 % — ABNORMAL HIGH (ref 11.2–14.5)
WBC: 2.2 10*3/uL — AB (ref 3.9–10.3)
lymph#: 0.9 10*3/uL (ref 0.9–3.3)

## 2017-04-29 NOTE — Telephone Encounter (Signed)
Met with patient in the lobby to review lab results from today. Patient unable to start chemo due to low WBC's. Patient verbalized an understanding. Patient escorted to scheduling to make an appointment for labs in one week. Pt understands to keep appointment with Dr. Benay Spice on July 5.   Discussed neutropenic precautions with patient for preventative measures. 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

## 2017-04-29 NOTE — Telephone Encounter (Signed)
Scheduled apptper sch message from Sherlyn Lick 6/13 - patient aware- Gave calender.

## 2017-05-05 ENCOUNTER — Other Ambulatory Visit (HOSPITAL_BASED_OUTPATIENT_CLINIC_OR_DEPARTMENT_OTHER): Payer: Medicare Other

## 2017-05-05 ENCOUNTER — Telehealth: Payer: Self-pay | Admitting: Nurse Practitioner

## 2017-05-05 ENCOUNTER — Other Ambulatory Visit: Payer: Self-pay | Admitting: Nurse Practitioner

## 2017-05-05 DIAGNOSIS — C187 Malignant neoplasm of sigmoid colon: Secondary | ICD-10-CM | POA: Diagnosis not present

## 2017-05-05 DIAGNOSIS — C787 Secondary malignant neoplasm of liver and intrahepatic bile duct: Principal | ICD-10-CM

## 2017-05-05 DIAGNOSIS — C189 Malignant neoplasm of colon, unspecified: Secondary | ICD-10-CM

## 2017-05-05 DIAGNOSIS — C20 Malignant neoplasm of rectum: Secondary | ICD-10-CM

## 2017-05-05 LAB — COMPREHENSIVE METABOLIC PANEL
ALBUMIN: 4 g/dL (ref 3.5–5.0)
ALK PHOS: 85 U/L (ref 40–150)
ALT: 12 U/L (ref 0–55)
ANION GAP: 7 meq/L (ref 3–11)
AST: 29 U/L (ref 5–34)
BUN: 14.7 mg/dL (ref 7.0–26.0)
CALCIUM: 9.6 mg/dL (ref 8.4–10.4)
CO2: 25 mEq/L (ref 22–29)
Chloride: 107 mEq/L (ref 98–109)
Creatinine: 1 mg/dL (ref 0.6–1.1)
EGFR: 53 mL/min/{1.73_m2} — AB (ref >90)
Glucose: 104 mg/dl (ref 70–140)
POTASSIUM: 4.1 meq/L (ref 3.5–5.1)
Sodium: 139 mEq/L (ref 136–145)
Total Bilirubin: 0.53 mg/dL (ref 0.20–1.20)
Total Protein: 7.6 g/dL (ref 6.4–8.3)

## 2017-05-05 LAB — CBC WITH DIFFERENTIAL/PLATELET
BASO%: 1.5 % (ref 0.0–2.0)
BASOS ABS: 0 10*3/uL (ref 0.0–0.1)
EOS ABS: 0.1 10*3/uL (ref 0.0–0.5)
EOS%: 2.5 % (ref 0.0–7.0)
HEMATOCRIT: 32.5 % — AB (ref 34.8–46.6)
HEMOGLOBIN: 10.3 g/dL — AB (ref 11.6–15.9)
LYMPH%: 40.5 % (ref 14.0–49.7)
MCH: 24.1 pg — AB (ref 25.1–34.0)
MCHC: 31.7 g/dL (ref 31.5–36.0)
MCV: 76.1 fL — ABNORMAL LOW (ref 79.5–101.0)
MONO#: 0.3 10*3/uL (ref 0.1–0.9)
MONO%: 11.8 % (ref 0.0–14.0)
NEUT#: 1 10*3/uL — ABNORMAL LOW (ref 1.5–6.5)
NEUT%: 43.7 % (ref 38.4–76.8)
Platelets: 199 10*3/uL (ref 145–400)
RBC: 4.27 10*6/uL (ref 3.70–5.45)
RDW: 22 % — AB (ref 11.2–14.5)
WBC: 2.3 10*3/uL — ABNORMAL LOW (ref 3.9–10.3)
lymph#: 0.9 10*3/uL (ref 0.9–3.3)

## 2017-05-05 LAB — CEA (IN HOUSE-CHCC): CEA (CHCC-In House): 2.66 ng/mL (ref 0.00–5.00)

## 2017-05-05 NOTE — Telephone Encounter (Signed)
Attempted to reach Ms. Urquiza regarding today's labs. We will try again on 05/06/2017.

## 2017-05-06 ENCOUNTER — Telehealth: Payer: Self-pay | Admitting: *Deleted

## 2017-05-06 NOTE — Telephone Encounter (Signed)
Telephone call to patient- lab scheduled for next Tuesday. Patient will continue to hold Xeloda. She understands to call with any signs or symptoms of illness or infection.

## 2017-05-06 NOTE — Telephone Encounter (Signed)
-----   Message from Owens Shark, NP sent at 05/05/2017  5:23 PM EDT ----- Please tell her the neutrophil count remains low. Need to hold Xeloda and repeat a CBC in one week. Have her call with fever, chills, other signs of infection.

## 2017-05-12 ENCOUNTER — Other Ambulatory Visit (HOSPITAL_BASED_OUTPATIENT_CLINIC_OR_DEPARTMENT_OTHER): Payer: Medicare Other

## 2017-05-12 ENCOUNTER — Telehealth: Payer: Self-pay | Admitting: *Deleted

## 2017-05-12 ENCOUNTER — Telehealth: Payer: Self-pay | Admitting: Oncology

## 2017-05-12 DIAGNOSIS — C189 Malignant neoplasm of colon, unspecified: Secondary | ICD-10-CM

## 2017-05-12 DIAGNOSIS — C787 Secondary malignant neoplasm of liver and intrahepatic bile duct: Principal | ICD-10-CM

## 2017-05-12 DIAGNOSIS — C187 Malignant neoplasm of sigmoid colon: Secondary | ICD-10-CM | POA: Diagnosis not present

## 2017-05-12 LAB — CBC WITH DIFFERENTIAL/PLATELET
BASO%: 2.6 % — ABNORMAL HIGH (ref 0.0–2.0)
Basophils Absolute: 0.1 10*3/uL (ref 0.0–0.1)
EOS ABS: 0.1 10*3/uL (ref 0.0–0.5)
EOS%: 2.8 % (ref 0.0–7.0)
HEMATOCRIT: 30.7 % — AB (ref 34.8–46.6)
HEMOGLOBIN: 9.9 g/dL — AB (ref 11.6–15.9)
LYMPH#: 1.2 10*3/uL (ref 0.9–3.3)
LYMPH%: 36.8 % (ref 14.0–49.7)
MCH: 24.2 pg — AB (ref 25.1–34.0)
MCHC: 32.1 g/dL (ref 31.5–36.0)
MCV: 75.4 fL — ABNORMAL LOW (ref 79.5–101.0)
MONO#: 0.4 10*3/uL (ref 0.1–0.9)
MONO%: 11 % (ref 0.0–14.0)
NEUT%: 46.8 % (ref 38.4–76.8)
NEUTROS ABS: 1.5 10*3/uL (ref 1.5–6.5)
Platelets: 172 10*3/uL (ref 145–400)
RBC: 4.07 10*6/uL (ref 3.70–5.45)
RDW: 20.9 % — AB (ref 11.2–14.5)
WBC: 3.3 10*3/uL — AB (ref 3.9–10.3)

## 2017-05-12 NOTE — Telephone Encounter (Signed)
lvm to inform pt of r/s 7/6 appt to 7/11 at 1400 per sch msg

## 2017-05-12 NOTE — Telephone Encounter (Signed)
Late entry for 1450: Spoke with pt in lobby. MD reviewed CBC. OK to begin Xeloda tonight, we'll reschedule office visit to 7/11, per Dr. Benay Spice. Pt voiced understanding.

## 2017-05-21 ENCOUNTER — Other Ambulatory Visit: Payer: Medicare Other

## 2017-05-21 ENCOUNTER — Ambulatory Visit: Payer: Medicare Other | Admitting: Oncology

## 2017-05-27 ENCOUNTER — Other Ambulatory Visit: Payer: Self-pay

## 2017-05-27 ENCOUNTER — Other Ambulatory Visit (HOSPITAL_BASED_OUTPATIENT_CLINIC_OR_DEPARTMENT_OTHER): Payer: Medicare Other

## 2017-05-27 ENCOUNTER — Ambulatory Visit (HOSPITAL_BASED_OUTPATIENT_CLINIC_OR_DEPARTMENT_OTHER): Payer: Medicare Other | Admitting: Oncology

## 2017-05-27 VITALS — BP 126/74 | HR 73 | Temp 97.7°F | Resp 18 | Ht 67.0 in | Wt 172.1 lb

## 2017-05-27 DIAGNOSIS — C189 Malignant neoplasm of colon, unspecified: Secondary | ICD-10-CM

## 2017-05-27 DIAGNOSIS — C787 Secondary malignant neoplasm of liver and intrahepatic bile duct: Secondary | ICD-10-CM

## 2017-05-27 DIAGNOSIS — C20 Malignant neoplasm of rectum: Secondary | ICD-10-CM

## 2017-05-27 DIAGNOSIS — C187 Malignant neoplasm of sigmoid colon: Secondary | ICD-10-CM

## 2017-05-27 DIAGNOSIS — R93421 Abnormal radiologic findings on diagnostic imaging of right kidney: Secondary | ICD-10-CM | POA: Diagnosis not present

## 2017-05-27 DIAGNOSIS — D701 Agranulocytosis secondary to cancer chemotherapy: Secondary | ICD-10-CM

## 2017-05-27 DIAGNOSIS — D630 Anemia in neoplastic disease: Secondary | ICD-10-CM

## 2017-05-27 LAB — CBC WITH DIFFERENTIAL/PLATELET
BASO%: 2.1 % — ABNORMAL HIGH (ref 0.0–2.0)
BASOS ABS: 0 10*3/uL (ref 0.0–0.1)
EOS ABS: 0.1 10*3/uL (ref 0.0–0.5)
EOS%: 2.9 % (ref 0.0–7.0)
HCT: 31 % — ABNORMAL LOW (ref 34.8–46.6)
HEMOGLOBIN: 10 g/dL — AB (ref 11.6–15.9)
LYMPH%: 47.3 % (ref 14.0–49.7)
MCH: 24.1 pg — AB (ref 25.1–34.0)
MCHC: 32.1 g/dL (ref 31.5–36.0)
MCV: 75 fL — AB (ref 79.5–101.0)
MONO#: 0.2 10*3/uL (ref 0.1–0.9)
MONO%: 8.6 % (ref 0.0–14.0)
NEUT#: 0.9 10*3/uL — ABNORMAL LOW (ref 1.5–6.5)
NEUT%: 39.1 % (ref 38.4–76.8)
Platelets: 265 10*3/uL (ref 145–400)
RBC: 4.14 10*6/uL (ref 3.70–5.45)
RDW: 23.1 % — ABNORMAL HIGH (ref 11.2–14.5)
WBC: 2.3 10*3/uL — ABNORMAL LOW (ref 3.9–10.3)
lymph#: 1.1 10*3/uL (ref 0.9–3.3)

## 2017-05-27 LAB — COMPREHENSIVE METABOLIC PANEL
ALBUMIN: 4 g/dL (ref 3.5–5.0)
ALK PHOS: 78 U/L (ref 40–150)
ALT: 8 U/L (ref 0–55)
AST: 20 U/L (ref 5–34)
Anion Gap: 9 mEq/L (ref 3–11)
BUN: 13.8 mg/dL (ref 7.0–26.0)
CHLORIDE: 106 meq/L (ref 98–109)
CO2: 23 mEq/L (ref 22–29)
Calcium: 9.5 mg/dL (ref 8.4–10.4)
Creatinine: 1.1 mg/dL (ref 0.6–1.1)
EGFR: 51 mL/min/{1.73_m2} — AB (ref >90)
GLUCOSE: 93 mg/dL (ref 70–140)
POTASSIUM: 3.9 meq/L (ref 3.5–5.1)
SODIUM: 138 meq/L (ref 136–145)
Total Bilirubin: 0.53 mg/dL (ref 0.20–1.20)
Total Protein: 7.3 g/dL (ref 6.4–8.3)

## 2017-05-27 MED ORDER — TRAMADOL HCL 50 MG PO TABS: 50.0000 mg | ORAL_TABLET | Freq: Four times a day (QID) | ORAL | 0 refills | Status: AC | PRN

## 2017-05-27 NOTE — Progress Notes (Signed)
[place] OFFICE PROGRESS NOTE   Diagnosis: Colon cancer   INTERVAL HISTORY:   Nichole Cross returns as scheduled. She began the final cycle of Xeloda on 05/12/2017. No mouth sores or hand/foot pain. Mild diarrhea. She feels well. She is working. She has pain in the right knee. She is seen orthopedics.  Objective:  Vital signs in last 24 hours:  Blood pressure 126/74, pulse 73, temperature 97.7 F (36.5 C), temperature source Oral, resp. rate 18, height '5\' 7"'  (1.702 m), weight 172 lb 1.6 oz (78.1 kg), SpO2 100 %.    HEENT: No thrush or ulcers Resp: Lungs clear bilaterally Cardio: Regular rate and rhythm GI: No hepatomegaly, no mass, nontender Vascular: No leg edema  Skin: Palms and soles without erythema or skin breakdown     Lab Results:  Lab Results  Component Value Date   WBC 2.3 (L) 05/27/2017   HGB 10.0 (L) 05/27/2017   HCT 31.0 (L) 05/27/2017   MCV 75.0 (L) 05/27/2017   PLT 265 05/27/2017   NEUTROABS 0.9 (L) 05/27/2017    CMP     Component Value Date/Time   NA 138 05/27/2017 1408   K 3.9 05/27/2017 1408   CL 102 01/12/2017 0438   CO2 23 05/27/2017 1408   GLUCOSE 93 05/27/2017 1408   BUN 13.8 05/27/2017 1408   CREATININE 1.1 05/27/2017 1408   CALCIUM 9.5 05/27/2017 1408   PROT 7.3 05/27/2017 1408   ALBUMIN 4.0 05/27/2017 1408   AST 20 05/27/2017 1408   ALT 8 05/27/2017 1408   ALKPHOS 78 05/27/2017 1408   BILITOT 0.53 05/27/2017 1408   GFRNONAA >60 01/12/2017 0438   GFRAA >60 01/12/2017 0438    Lab Results  Component Value Date   CEA1 2.66 05/05/2017     Medications: I have reviewed the patient's current medications.  Assessment/Plan: 1. Adenocarcinoma the distal sigmoid colon, poorly differentiated, stage II (T3 N0), status post a laparoscopic assisted sigmoid colectomy 08/09/2015  No loss of mismatch repair protein expression  Elevated preoperative CEA  Staging CT of the abdomen and pelvis 06/22/2015 with no evidence of  distant metastatic disease  Mildly elevated CEA 02/23/2017And 04/17/2016  CTs chest, abdomen, and pelvis on 06/04/2016, 38m left upper lobe nodule-no comparison available, new hypodense lesion in the left liver, solid Right renal mass  MRI 06/10/2016-2 enhancing lesions in the liver consistent with metastatic disease, segment 2 and segment 5. Solid enhancing right renal lesion consistent with a renal cell carcinoma  Radiofrequency ablation of 2 liver lesions on 07/25/2016  Cycle 1 adjuvant Xeloda 08/18/2016  Cycle 2 Xeloda 09/08/2016  CEA improved 09/26/2016  Cycle 3 Xeloda held 09/29/2016 due to unexplained abdominal pain,referred for CT  CT abdomen/pelvis 09/30/2016 with a long segment of bowel wall thickening involving the distal ileum; lesion left hepatic lobe similar to slightly smaller following ablation; lesion posterior right hepatic lobe elongated favored to represent treatment tract.  Cycle 3 Xeloda 10/10/2016 (dose reduced due to drug induced enteritis following cycle 2)discontinued 10/15/2016 secondary to abdominal pain.  CT in while the emergency room with abdominal pain 10/15/2016 revealed descending colitis  Colonoscopy 10/30/2016 confirmed a mass at the: colo-colonicanastomosis  Cycle 4 Xeloda 11/03/2016  01/09/2017 status post a low anterior resection for removal of locally recurrent tumor at the sigmoid anastomosis, resection margins negative, tumor invades pericolonic soft tissue, 12 benign lymph nodes  Cycle 5 Xeloda 02/12/2017  Cycle 6 Xeloda 03/12/2017, Xeloda dose reduced to 1000 mg twice daily on 03/18/2017  Cycle 7 Xeloda 04/09/2017  Cycle 8 Xeloda 05/12/2017   2.History ofMicrocytic anemia-likely iron deficiency anemia secondary to colon cancer, persistent anemia likely secondary to chemotherapy  3.Tubular villous adenoma on the sigmoid colon resection specimen 08/09/2015  Multiple tubular adenomas removed on a colonoscopy  11/16/2015  4. Right renal mass on CT 06/04/2016-suspicious for renal cell carcinoma  5. neutropenia secondary to chemotherapy    Disposition:  She appears well. She will complete the final cycle of adjuvant chemotherapy today. She has mild neutropenia. She will contact us for a fever or symptoms of infection. The neutrophil count should recover over the next few weeks.  Nichole Cross will return for an office visit and restaging CT evaluation in approximately 4 weeks.  She will see urology to discuss management of the renal mass depending on the restaging CTs.  15 minutes were spent with the patient today. The majority of the time was used for counseling and coordination of care.  Donneta Romberg, MD  05/27/2017  2:59 PM

## 2017-06-24 ENCOUNTER — Ambulatory Visit (HOSPITAL_COMMUNITY)
Admission: RE | Admit: 2017-06-24 | Discharge: 2017-06-24 | Disposition: A | Discharge status: Home / Self Care | Payer: Medicare Other | Source: Ambulatory Visit | Attending: Oncology | Admitting: Oncology

## 2017-06-24 ENCOUNTER — Encounter (HOSPITAL_COMMUNITY): Payer: Self-pay

## 2017-06-24 DIAGNOSIS — K769 Liver disease, unspecified: Secondary | ICD-10-CM | POA: Insufficient documentation

## 2017-06-24 DIAGNOSIS — N289 Disorder of kidney and ureter, unspecified: Secondary | ICD-10-CM | POA: Diagnosis not present

## 2017-06-24 DIAGNOSIS — C187 Malignant neoplasm of sigmoid colon: Secondary | ICD-10-CM | POA: Diagnosis not present

## 2017-06-24 MED ORDER — IOPAMIDOL (ISOVUE-300) INJECTION 61%
INTRAVENOUS | Status: AC
  Filled 2017-06-24: qty 100

## 2017-06-24 MED ORDER — IOPAMIDOL (ISOVUE-300) INJECTION 61%
100.0000 mL | Freq: Once | INTRAVENOUS | Status: AC | PRN
  Administered 2017-06-24: 100 mL via INTRAVENOUS

## 2017-06-25 ENCOUNTER — Ambulatory Visit (HOSPITAL_BASED_OUTPATIENT_CLINIC_OR_DEPARTMENT_OTHER): Payer: Medicare Other | Admitting: Nurse Practitioner

## 2017-06-25 ENCOUNTER — Other Ambulatory Visit (HOSPITAL_BASED_OUTPATIENT_CLINIC_OR_DEPARTMENT_OTHER): Payer: Medicare Other

## 2017-06-25 ENCOUNTER — Telehealth: Payer: Self-pay | Admitting: Oncology

## 2017-06-25 VITALS — BP 148/63 | HR 62 | Temp 97.5°F | Resp 18 | Ht 67.0 in | Wt 176.4 lb

## 2017-06-25 DIAGNOSIS — N289 Disorder of kidney and ureter, unspecified: Secondary | ICD-10-CM | POA: Diagnosis not present

## 2017-06-25 DIAGNOSIS — C187 Malignant neoplasm of sigmoid colon: Secondary | ICD-10-CM

## 2017-06-25 DIAGNOSIS — Z85038 Personal history of other malignant neoplasm of large intestine: Secondary | ICD-10-CM

## 2017-06-25 DIAGNOSIS — C787 Secondary malignant neoplasm of liver and intrahepatic bile duct: Principal | ICD-10-CM

## 2017-06-25 DIAGNOSIS — C189 Malignant neoplasm of colon, unspecified: Secondary | ICD-10-CM

## 2017-06-25 LAB — CBC WITH DIFFERENTIAL/PLATELET
BASO%: 2.4 % — AB (ref 0.0–2.0)
Basophils Absolute: 0.1 10*3/uL (ref 0.0–0.1)
EOS ABS: 0.1 10*3/uL (ref 0.0–0.5)
EOS%: 2.7 % (ref 0.0–7.0)
HCT: 32.3 % — ABNORMAL LOW (ref 34.8–46.6)
HGB: 10.1 g/dL — ABNORMAL LOW (ref 11.6–15.9)
LYMPH%: 36.2 % (ref 14.0–49.7)
MCH: 23.5 pg — ABNORMAL LOW (ref 25.1–34.0)
MCHC: 31.3 g/dL — ABNORMAL LOW (ref 31.5–36.0)
MCV: 75.3 fL — AB (ref 79.5–101.0)
MONO#: 0.4 10*3/uL (ref 0.1–0.9)
MONO%: 11.9 % (ref 0.0–14.0)
NEUT%: 46.8 % (ref 38.4–76.8)
NEUTROS ABS: 1.6 10*3/uL (ref 1.5–6.5)
Platelets: 244 10*3/uL (ref 145–400)
RBC: 4.29 10*6/uL (ref 3.70–5.45)
RDW: 21.1 % — ABNORMAL HIGH (ref 11.2–14.5)
WBC: 3.4 10*3/uL — AB (ref 3.9–10.3)
lymph#: 1.2 10*3/uL (ref 0.9–3.3)

## 2017-06-25 LAB — CEA (IN HOUSE-CHCC): CEA (CHCC-In House): 6.47 ng/mL — ABNORMAL HIGH (ref 0.00–5.00)

## 2017-06-25 NOTE — Progress Notes (Addendum)
[place] OFFICE PROGRESS NOTE   Diagnosis:  Colon cancer  INTERVAL HISTORY:   Ms. Licklider returns as scheduled. She overall is feeling well. No mouth sores. No diarrhea. No hand or foot problems. She notes abdominal pain when she becomes constipated. She sometimes has diarrhea after a bout of constipation.  Objective:  Vital signs in last 24 hours:  Blood pressure (!) 148/63, pulse 62, temperature (!) 97.5 F (36.4 C), temperature source Oral, resp. rate 18, height _0  (1.702 m), weight 176 lb 6.4 oz (80 kg), SpO2 100 %.    HEENT: No thrush or ulcers. Lymphatics: No palpable cervical or supraclavicular lymph nodes. Resp: Lungs clear bilaterally. Cardio: Regular rate and rhythm. GI: No hepatosplenomegaly. No mass. Vascular: No leg edema.   Lab Results:  Lab Results  Component Value Date   WBC 3.4 (L) 06/25/2017   HGB 10.1 (L) 06/25/2017   HCT 32.3 (L) 06/25/2017   MCV 75.3 (L) 06/25/2017   PLT 244 06/25/2017   NEUTROABS 1.6 06/25/2017    Imaging:  Ct Chest W Contrast  Result Date: 06/24/2017 CLINICAL DATA:  Restaging colon cancer. EXAM: CT CHEST, ABDOMEN, AND PELVIS WITH CONTRAST TECHNIQUE: Multidetector CT imaging of the chest, abdomen and pelvis was performed following the standard protocol during bolus administration of intravenous contrast. CONTRAST:  14m ISOVUE-300 IOPAMIDOL (ISOVUE-300) INJECTION 61% COMPARISON:  CT scan 09/30/2016 and 10/15/2016. Chest CT 06/03/2016 stable 10 mm FINDINGS: CT CHEST FINDINGS Cardiovascular: The heart is normal in size. No pericardial effusion. The thoracic aorta is normal. Branch vessels are patent. No definite coronary artery calcifications. Mediastinum/Nodes: No mediastinal or hilar mass or lymphadenopathy. Small scattered lymph nodes are stable. The esophagus is grossly normal. Lungs/Pleura: Minimal biapical scarring changes. No worrisome pulmonary lesions to suggest pulmonary metastatic disease. No infiltrates or  effusions. Chest wall/ Musculoskeletal: No breast masses, supraclavicular or axillary lymphadenopathy. No significant bony findings. CT ABDOMEN PELVIS FINDINGS Hepatobiliary: Areas of prior RF ablation are again demonstrated. In the left hepatic lobe the lesion measures approximately 14 x 11 mm and previously measured 15 x 15 mm. The 2 adjacent lesions in the right hepatic lobe are also smaller. The more anterior lesion on image number 65 measures 16.5 x 10 mm and previously measured 23 x 12 mm. The more posterior lesion measures 19.5 x 11.5 mm and previously measured 20 x 12 mm. No new hepatic lesions to suggest new metastatic hepatic disease. The gallbladder is normal. No common bile duct dilatation. Pancreas: No mass, inflammation or duct dilatation. Spleen: Normal size.  Stable small lesion in the splenic hilum. Adrenals/Urinary Tract: Stable thickening of the medial limb of the left adrenal gland. The right adrenal gland is normal. There is a stable 2.6 x 2.5 cm solid upper pole left renal mass. Stomach/Bowel: The stomach, duodenum, small bowel and colon are grossly normal. No inflammatory changes, mass lesions or obstructive findings. Stable surgical changes at the rectosigmoid junction. Vascular/Lymphatic: The aorta is normal in caliber. No dissection. The branch vessels are patent. The major venous structures are patent. No mesenteric or retroperitoneal mass or adenopathy. Small scattered lymph nodes are noted. Reproductive: Surgically absent. Other: No pelvic mass or adenopathy. No free pelvic fluid collections. No inguinal mass or adenopathy. Musculoskeletal: No significant bony findings. IMPRESSION: 1. No CT findings for metastatic disease involving the chest. 2. Continued contraction of the part ablation lesions in the right and left hepatic lobes as described above. No new hepatic metastatic disease. 3. No mesenteric or retroperitoneal  mass or adenopathy. 4. Stable surgical changes involving the  rectosigmoid area without definite findings for recurrent tumor. 5. Stable solid right upper pole renal lesion. Electronically Signed   By: Marijo Sanes M.D.   On: 06/24/2017 16:02   Ct Abdomen Pelvis W Contrast  Result Date: 06/24/2017 CLINICAL DATA:  Restaging colon cancer. EXAM: CT CHEST, ABDOMEN, AND PELVIS WITH CONTRAST TECHNIQUE: Multidetector CT imaging of the chest, abdomen and pelvis was performed following the standard protocol during bolus administration of intravenous contrast. CONTRAST:  163m ISOVUE-300 IOPAMIDOL (ISOVUE-300) INJECTION 61% COMPARISON:  CT scan 09/30/2016 and 10/15/2016. Chest CT 06/03/2016 stable 10 mm FINDINGS: CT CHEST FINDINGS Cardiovascular: The heart is normal in size. No pericardial effusion. The thoracic aorta is normal. Branch vessels are patent. No definite coronary artery calcifications. Mediastinum/Nodes: No mediastinal or hilar mass or lymphadenopathy. Small scattered lymph nodes are stable. The esophagus is grossly normal. Lungs/Pleura: Minimal biapical scarring changes. No worrisome pulmonary lesions to suggest pulmonary metastatic disease. No infiltrates or effusions. Chest wall/ Musculoskeletal: No breast masses, supraclavicular or axillary lymphadenopathy. No significant bony findings. CT ABDOMEN PELVIS FINDINGS Hepatobiliary: Areas of prior RF ablation are again demonstrated. In the left hepatic lobe the lesion measures approximately 14 x 11 mm and previously measured 15 x 15 mm. The 2 adjacent lesions in the right hepatic lobe are also smaller. The more anterior lesion on image number 65 measures 16.5 x 10 mm and previously measured 23 x 12 mm. The more posterior lesion measures 19.5 x 11.5 mm and previously measured 20 x 12 mm. No new hepatic lesions to suggest new metastatic hepatic disease. The gallbladder is normal. No common bile duct dilatation. Pancreas: No mass, inflammation or duct dilatation. Spleen: Normal size.  Stable small lesion in the splenic  hilum. Adrenals/Urinary Tract: Stable thickening of the medial limb of the left adrenal gland. The right adrenal gland is normal. There is a stable 2.6 x 2.5 cm solid upper pole left renal mass. Stomach/Bowel: The stomach, duodenum, small bowel and colon are grossly normal. No inflammatory changes, mass lesions or obstructive findings. Stable surgical changes at the rectosigmoid junction. Vascular/Lymphatic: The aorta is normal in caliber. No dissection. The branch vessels are patent. The major venous structures are patent. No mesenteric or retroperitoneal mass or adenopathy. Small scattered lymph nodes are noted. Reproductive: Surgically absent. Other: No pelvic mass or adenopathy. No free pelvic fluid collections. No inguinal mass or adenopathy. Musculoskeletal: No significant bony findings. IMPRESSION: 1. No CT findings for metastatic disease involving the chest. 2. Continued contraction of the part ablation lesions in the right and left hepatic lobes as described above. No new hepatic metastatic disease. 3. No mesenteric or retroperitoneal mass or adenopathy. 4. Stable surgical changes involving the rectosigmoid area without definite findings for recurrent tumor. 5. Stable solid right upper pole renal lesion. Electronically Signed   By: PMarijo SanesM.D.   On: 06/24/2017 16:02    Medications: I have reviewed the patient's current medications.  Assessment/Plan: 1. Adenocarcinoma the distal sigmoid colon, poorly differentiated, stage II (T3 N0), status post a laparoscopic assisted sigmoid colectomy 08/09/2015  No loss of mismatch repair protein expression  Elevated preoperative CEA  Staging CT of the abdomen and pelvis 06/22/2015 with no evidence of distant metastatic disease  Mildly elevated CEA 02/23/2017And 04/17/2016  CTs chest, abdomen, and pelvis on 06/04/2016, 229mleft upper lobe nodule-no comparison available, new hypodense lesion in the left liver, solid Right renal mass  MRI  06/10/2016-2 enhancing lesions in  the liver consistent with metastatic disease, segment 2 and segment 5. Solid enhancing right renal lesion consistent with a renal cell carcinoma  Radiofrequency ablation of 2 liver lesions on 07/25/2016  Cycle 1 adjuvant Xeloda 08/18/2016  Cycle 2 Xeloda 09/08/2016  CEA improved 09/26/2016  Cycle 3 Xeloda held 09/29/2016 due to unexplained abdominal pain,referred for CT  CT abdomen/pelvis 09/30/2016 with a long segment of bowel wall thickening involving the distal ileum; lesion left hepatic lobe similar to slightly smaller following ablation; lesion posterior right hepatic lobe elongated favored to represent treatment tract.  Cycle 3 Xeloda 10/10/2016 (dose reduced due to drug induced enteritis following cycle 2)discontinued 10/15/2016 secondary to abdominal pain.  CT in while the emergency room with abdominal pain 10/15/2016 revealed descending colitis  Colonoscopy 10/30/2016 confirmed a mass at the: colo-colonicanastomosis  Cycle 4 Xeloda 11/03/2016  01/09/2017 status post a low anterior resection for removal of locally recurrent tumor at the sigmoid anastomosis, resection margins negative, tumor invades pericolonic soft tissue, 12 benign lymph nodes; MSI stabl  Cycle 5 Xeloda 02/12/2017  Cycle 6 Xeloda 03/12/2017, Xeloda dose reduced to 1000 mg twice daily on 03/18/2017  Cycle 7 Xeloda 04/09/2017  Cycle 8 Xeloda 05/12/2017  Restaging CTs 06/24/2017-no evidence of metastatic disease involving the chest. Post ablation changes in the liver. No new hepatic metastatic disease.   2.History ofMicrocytic anemia-likely iron deficiency anemia secondary to colon cancer, persistent anemia likely secondary to chemotherapy  3.Tubular villous adenoma on the sigmoid colon resection specimen 08/09/2015  Multiple tubular adenomas removed on a colonoscopy 11/16/2015  4. Right renal mass on CT 06/04/2016-suspicious for renal cell carcinoma;  stable on CT 06/24/2017; referred to urology  5. History of neutropenia secondary to chemotherapy     Disposition: Nichole Cross appears stable. The restaging CTs from yesterday showed no evidence of aggressive disease. We will follow-up on the CEA from today.  The mass on the right kidney was stable. We will refer her back to Dr. Noah Delaine to discuss management going forward.  She will return for a CEA and follow-up visit in 3 months. Next CT scans planned at a six-month interval.  Patient seen with Dr. Benay Spice.    [name] ANP/GNP-BC   06/25/2017  2:22 PM This was a shared visit with [name]. We discussed the restaging CT findings with Ms. Kulkarni and her family. She is in clinical remission from colon cancer. We will refer her to urology for management of the renal mass.  She will return for an office visit and CEA in 3 months.  Julieanne Manson, M.D.

## 2017-06-25 NOTE — Telephone Encounter (Signed)
Gave patient avs and calender.   Faxing over a referral to [facility].

## 2017-06-29 ENCOUNTER — Telehealth: Payer: Self-pay | Admitting: Nurse Practitioner

## 2017-06-29 ENCOUNTER — Telehealth: Payer: Self-pay | Admitting: Oncology

## 2017-06-29 ENCOUNTER — Other Ambulatory Visit: Payer: Self-pay | Admitting: Nurse Practitioner

## 2017-06-29 DIAGNOSIS — C189 Malignant neoplasm of colon, unspecified: Secondary | ICD-10-CM

## 2017-06-29 DIAGNOSIS — C787 Secondary malignant neoplasm of liver and intrahepatic bile duct: Principal | ICD-10-CM

## 2017-06-29 NOTE — Telephone Encounter (Signed)
Spoke with patient regarding her added lab appt at 9/20.

## 2017-06-29 NOTE — Telephone Encounter (Signed)
I notified Ms. Ryer of the recent mildly elevated CEA. Dr. Benay Spice would like to have this repeated at a 6 week interval. She expressed understanding. Orders entered and message sent to scheduling.

## 2017-08-06 ENCOUNTER — Telehealth: Payer: Self-pay | Admitting: *Deleted

## 2017-08-06 ENCOUNTER — Other Ambulatory Visit (HOSPITAL_BASED_OUTPATIENT_CLINIC_OR_DEPARTMENT_OTHER): Payer: Medicare Other

## 2017-08-06 DIAGNOSIS — Z85038 Personal history of other malignant neoplasm of large intestine: Secondary | ICD-10-CM

## 2017-08-06 DIAGNOSIS — C189 Malignant neoplasm of colon, unspecified: Secondary | ICD-10-CM

## 2017-08-06 DIAGNOSIS — C787 Secondary malignant neoplasm of liver and intrahepatic bile duct: Principal | ICD-10-CM

## 2017-08-06 LAB — CEA (IN HOUSE-CHCC): CEA (CHCC-IN HOUSE): 2.97 ng/mL (ref 0.00–5.00)

## 2017-08-06 NOTE — Telephone Encounter (Signed)
-----   Message from Ladell Pier, MD sent at 08/06/2017  5:16 PM EDT ----- Please call patient, cea is normal, f/u as scheduled

## 2017-08-06 NOTE — Telephone Encounter (Signed)
Notified pt of normal CEA. She voiced appreciation for call.

## 2017-08-20 ENCOUNTER — Telehealth: Payer: Self-pay

## 2017-08-20 NOTE — Telephone Encounter (Signed)
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 They are cleaning it but she is not sure the mold is being handled well. She is wearing a mask.  Is this wise to work in that condition with her hx of cancer?  She is feeling tired and headachey. This has been in last week or so. No SOB, no new activities, no new medications. She says she does feel better over the weekends.  From discussion with this RN, she will talk with her advisor/principal about not working in that room. She does have a nurse at the daycare helping her.   Her next appt 11/9.

## 2017-08-31 ENCOUNTER — Other Ambulatory Visit (HOSPITAL_COMMUNITY): Payer: Self-pay | Admitting: Interventional Radiology

## 2017-08-31 DIAGNOSIS — C787 Secondary malignant neoplasm of liver and intrahepatic bile duct: Secondary | ICD-10-CM

## 2017-08-31 DIAGNOSIS — C641 Malignant neoplasm of right kidney, except renal pelvis: Secondary | ICD-10-CM

## 2017-09-25 ENCOUNTER — Other Ambulatory Visit (HOSPITAL_BASED_OUTPATIENT_CLINIC_OR_DEPARTMENT_OTHER): Payer: Medicare Other

## 2017-09-25 ENCOUNTER — Ambulatory Visit (HOSPITAL_BASED_OUTPATIENT_CLINIC_OR_DEPARTMENT_OTHER): Payer: Medicare Other | Admitting: Oncology

## 2017-09-25 ENCOUNTER — Telehealth: Payer: Self-pay | Admitting: Nurse Practitioner

## 2017-09-25 VITALS — BP 142/78 | HR 70 | Temp 97.9°F | Resp 18 | Ht 67.0 in | Wt 183.2 lb

## 2017-09-25 DIAGNOSIS — Z85038 Personal history of other malignant neoplasm of large intestine: Secondary | ICD-10-CM

## 2017-09-25 DIAGNOSIS — Z23 Encounter for immunization: Secondary | ICD-10-CM | POA: Diagnosis not present

## 2017-09-25 DIAGNOSIS — C189 Malignant neoplasm of colon, unspecified: Secondary | ICD-10-CM

## 2017-09-25 DIAGNOSIS — C20 Malignant neoplasm of rectum: Secondary | ICD-10-CM

## 2017-09-25 DIAGNOSIS — D509 Iron deficiency anemia, unspecified: Secondary | ICD-10-CM | POA: Diagnosis not present

## 2017-09-25 DIAGNOSIS — C787 Secondary malignant neoplasm of liver and intrahepatic bile duct: Principal | ICD-10-CM

## 2017-09-25 LAB — CBC WITH DIFFERENTIAL/PLATELET
BASO%: 2.8 % — AB (ref 0.0–2.0)
Basophils Absolute: 0.1 10*3/uL (ref 0.0–0.1)
EOS ABS: 0.1 10*3/uL (ref 0.0–0.5)
EOS%: 3.1 % (ref 0.0–7.0)
HEMATOCRIT: 35.2 % (ref 34.8–46.6)
HGB: 10.9 g/dL — ABNORMAL LOW (ref 11.6–15.9)
LYMPH#: 1.2 10*3/uL (ref 0.9–3.3)
LYMPH%: 40.5 % (ref 14.0–49.7)
MCH: 22.8 pg — ABNORMAL LOW (ref 25.1–34.0)
MCHC: 31 g/dL — ABNORMAL LOW (ref 31.5–36.0)
MCV: 73.5 fL — AB (ref 79.5–101.0)
MONO#: 0.2 10*3/uL (ref 0.1–0.9)
MONO%: 8.3 % (ref 0.0–14.0)
NEUT%: 45.3 % (ref 38.4–76.8)
NEUTROS ABS: 1.3 10*3/uL — AB (ref 1.5–6.5)
PLATELETS: 240 10*3/uL (ref 145–400)
RBC: 4.79 10*6/uL (ref 3.70–5.45)
RDW: 17.7 % — ABNORMAL HIGH (ref 11.2–14.5)
WBC: 2.9 10*3/uL — AB (ref 3.9–10.3)

## 2017-09-25 LAB — CEA (IN HOUSE-CHCC): CEA (CHCC-In House): 6.03 ng/mL — ABNORMAL HIGH (ref 0.00–5.00)

## 2017-09-25 MED ORDER — TRAMADOL HCL 50 MG PO TABS: 50.0000 mg | ORAL_TABLET | Freq: Four times a day (QID) | ORAL | 0 refills | Status: DC | PRN

## 2017-09-25 MED ORDER — INFLUENZA VAC SPLIT HIGH-DOSE 0.5 ML IM SUSY
0.5000 mL | PREFILLED_SYRINGE | INTRAMUSCULAR | Status: AC
  Administered 2017-09-25: 0.5 mL via INTRAMUSCULAR
  Filled 2017-09-25: qty 0.5

## 2017-09-25 NOTE — Telephone Encounter (Signed)
Gave avs and calendar for January 2019 °

## 2017-09-25 NOTE — Progress Notes (Signed)
[facility] OFF[facility] PROGRESS NOTE   Diagnosis: Colon cancer  INTERVAL HISTORY:   Nichole Cross returns as scheduled.  She reports malaise.  She is working.  Good appetite.  No bleeding.  She continues to have right knee pain.  She takes tramadol for relief of the pain.  Objective:  Vital signs in last 24 hours:  Blood pressure (!) 142/78, pulse 70, temperature 97.9 F (36.6 C), temperature source Oral, resp. rate 18, height 5' 7" (1.702 m), weight 183 lb 3.2 oz (83.1 kg), SpO2 100 %.    HEENT: Neck without mass Lymphatics: No cervical, supraclavicular, axillary, or inguinal nodes Resp: Lungs clear bilaterally Cardio: Regular rate and rhythm GI: No hepatosplenomegaly, nontender, no mass Vascular: Leg edema   Lab Results:  Lab Results  Component Value Date   WBC 2.9 (L) 09/25/2017   HGB 10.9 (L) 09/25/2017   HCT 35.2 09/25/2017   MCV 73.5 (L) 09/25/2017   PLT 240 09/25/2017   NEUTROABS 1.3 (L) 09/25/2017     Lab Results  Component Value Date   CEA1 2.97 08/06/2017    Medications: I have reviewed the patient's current medications.  Assessment/Plan: 1. Adenocarcinoma the distal sigmoid colon, poorly differentiated, stage II (T3 N0), status post a laparoscopic assisted sigmoid colectomy 08/09/2015  No loss of mismatch repair protein expression  Elevated preoperative CEA  Staging CT of the abdomen and pelvis 06/22/2015 with no evidence of distant metastatic disease  Mildly elevated CEA 02/23/2017And 04/17/2016  CTs chest, abdomen, and pelvis on 06/04/2016, 29m left upper lobe nodule-no comparison available, new hypodense lesion in the left liver, solid Right renal mass  MRI 06/10/2016-2 enhancing lesions in the liver consistent with metastatic disease, segment 2 and segment 5. Solid enhancing right renal lesion consistent with a renal cell carcinoma  Radiofrequency ablation of 2 liver lesions on 07/25/2016  Cycle 1 adjuvant Xeloda  08/18/2016  Cycle 2 Xeloda 09/08/2016  CEA improved 09/26/2016  Cycle 3 Xeloda held 09/29/2016 due to unexplained abdominal pain,referred for CT  CT abdomen/pelvis 09/30/2016 with a long segment of bowel wall thickening involving the distal ileum; lesion left hepatic lobe similar to slightly smaller following ablation; lesion posterior right hepatic lobe elongated favored to represent treatment tract.  Cycle 3 Xeloda 10/10/2016 (dose reduced due to drug induced enteritis following cycle 2)discontinued 10/15/2016 secondary to abdominal pain.  CT in while the emergency room with abdominal pain 10/15/2016 revealed descending colitis  Colonoscopy 10/30/2016 confirmed a mass at the: colo-colonicanastomosis  Cycle 4 Xeloda 11/03/2016  01/09/2017 status post a low anterior resection for removal of locally recurrent tumor at the sigmoid anastomosis, resection margins negative, tumor invades pericolonic soft tissue, 12 benign lymph nodes; MSI stabl  Cycle 5 Xeloda 02/12/2017  Cycle 6 Xeloda 03/12/2017, Xeloda dose reduced to 1000 mg twice daily on 03/18/2017  Cycle 7 Xeloda 04/09/2017  Cycle 8 Xeloda 05/12/2017  Restaging CTs 06/24/2017-no evidence of metastatic disease involving the chest. Post ablation changes in the liver. No new hepatic metastatic disease.   2.History ofMicrocytic anemia-likely iron deficiency anemia secondary to colon cancer, persistent microcytic anemia  3.Tubular villous adenoma on the sigmoid colon resection specimen 08/09/2015  Multiple tubular adenomas removed on a colonoscopy 11/16/2015  4. Right renal mass on CT 06/04/2016-suspicious for renal cell carcinoma; stable on CT 06/24/2017; referred to urology  5. History of neutropenia secondary to chemotherapy   Disposition:  She remains in clinical remission from colon cancer.  We will follow-up on the CEA from today. She  has persistent mild microcytic anemia.  This may be related to iron  deficiency.  She will begin daily ferrous sulfate.  She will use a stool softener or MiraLAX if she has constipation while on iron.  Nichole Cross will return for an office visit in 2 months.  We refilled her prescription for tramadol.  15 minutes were spent with the patient today.  The majority of the time was used for counseling and coordination of care.  [name], MD  09/25/2017  9:42 AM

## 2017-09-29 ENCOUNTER — Telehealth: Payer: Self-pay | Admitting: Emergency Medicine

## 2017-09-29 NOTE — Telephone Encounter (Addendum)
Spoke with pt regarding this note. Pt verbalized understanding of this note. Scheduling message sent for lab appt.   ----- Message from Ladell Pier, MD sent at 09/28/2017  6:22 PM EST ----- Please call patient, cea mildly elevated, repeat 1 month, if still elevated with schedule CT prior to next visit

## 2017-10-03 ENCOUNTER — Telehealth: Payer: Self-pay | Admitting: Oncology

## 2017-10-03 NOTE — Telephone Encounter (Signed)
Left voicemail for patient regarding appt added per 11/13 sch msg.

## 2017-10-11 NOTE — Patient Instructions (Addendum)
  Test(s) ordered today. Your results will be released to [facility] (or called to you) after review, usually within 72hours after test completion. If any changes need to be made, you will be notified at that same time.  Medications reviewed and updated.   No changes recommended at this time.  Please followup in one year  Continue doing brain stimulating activities (puzzles, reading, adult coloring books, staying active) to keep memory sharp.   Continue to eat heart healthy diet (full of fruits, vegetables, whole grains, lean protein, water--limit salt, fat, and sugar intake) and increase physical activity as tolerated.   Ms. Aber , Thank you for taking time to come for your Medicare Wellness Visit. I appreciate your ongoing commitment to your health goals. Please review the following plan we discussed and let me know if I can assist you in the future.   These are the goals we discussed: Goals    . 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       This is a list of the screening recommended for you and due dates:  Health Maintenance  Topic Date Due  . Pneumonia vaccines (2 of 2 - PCV13) 04/13/2010  . DEXA scan (bone density measurement)  12/03/2015  . Tetanus Vaccine  03/17/2016  . Mammogram  05/17/2017  . Colon Cancer Screening  10/30/2017  . Flu Shot  Completed

## 2017-10-11 NOTE — Progress Notes (Signed)
Subjective:    Patient ID: Nichole Cross, female    DOB: 1940/12/23, 76 y.o.   MRN: [id number]  HPI The patient is here for follow up.  Hypothyroidism:  She is taking her medication daily.  She has gained weight over the past few months.  She is also noticed increased fatigue and feels it is likely related to her thyroid.  She did complete chemotherapy a couple of months ago, but this fatigue feels similar to when her thyroid level was off.  She is not sleeping well.  She has tried melatonin and it helps.   She has as a sleeping medication that she can take if needed, but she tries to avoid taking this.  Right knee pain: She takes tramadol as needed.  This is prescribed by her oncologist.  Colon cancer: She was diagnosed with colon cancer and had surgery in 2016.  He was diagnosed with metastatic cancer in 2017 she was started on chemotherapy.  She completed chemotherapy in June 2018.  A restaging CT scan in August of this year showed no evidence of metastatic disease in the chest, post ablation changes in the liver, but no new liver metastasis.She has persistent microcytic anemia secondary to her colon cancer.  She is in remission from her colon cancer.  Her recent CEA level was slightly higher than her previous one and she has follow-up next month.    Medications and allergies reviewed with patient and updated if appropriate.  Patient Active Problem List   Diagnosis Date Noted  . Local recurrence of rectal cancer  ([place]) 01/09/2017  . Adenocarcinoma of colon metastatic to liver ([place]) 07/25/2016  . Osteoporosis 06/07/2016  . Anemia, iron deficiency 06/07/2016  . Sigmoid colon cancer s/p lap assisted sigmoid colectomy 08/09/15 08/09/2015  . Overweight (BMI 25.0-29.9) 09/10/2012  . S/P left UKR 09/09/2012  . ELEVATED BLOOD PRESSURE WITHOUT DIAGNOSIS OF HYPERTENSION 09/30/2010  . GANGLION OF TENDON SHEATH 12/20/2007  . FATTY LIVER DISEASE 08/13/2007  . Hypothyroidism 08/02/2007  .  CYSTITIS 08/02/2007  . Osteoarthritis 08/02/2007    Current Outpatient Medications on File Prior to Visit  Medication Sig Dispense Refill  . acetaminophen (TYLENOL) 500 MG tablet Take 500 mg by mouth.    . diazepam (VALIUM) 2 MG tablet Take 1 tablet (2 mg total) by mouth at bedtime as needed (for sleep). 20 tablet 0  . diphenhydrAMINE (BENADRYL) 50 MG capsule Take 50 mg by mouth at bedtime as needed.    . ferrous sulfate 325 (65 FE) MG EC tablet Take 325 mg by mouth 3 (three) times daily with meals.    Marland Kitchen levothyroxine (SYNTHROID, LEVOTHROID) 137 MCG tablet Take 1 tablet (137 mcg total) by mouth daily before breakfast. 90 tablet 3  . loperamide (IMODIUM) 2 MG capsule Take 2 mg by mouth as needed for diarrhea or loose stools.    . polyethylene glycol (MIRALAX / GLYCOLAX) packet Take 17 g by mouth daily as needed for mild constipation. Reported on 06/05/2016    . traMADol (ULTRAM) 50 MG tablet Take 1 tablet (50 mg total) every 6 (six) hours as needed by mouth. 60 tablet 0   No current facility-administered medications on file prior to visit.     Past Medical History:  Diagnosis Date  . Anemia   . Arthritis    knees  . Blood dyscrasia 2016   microcytic anemia, likely iron deficiency anemia secondary to colon cancer  . Cataracts, bilateral   . Colon cancer ([place]) 06/2015  tubular villous adenoma on sigmoid colon specimen (08/09/15), myltiple tubular adenomas removed on a colonoscopy  11/16/15  . Complication of anesthesia   . Headache(784.0)    migraines  . Hypothyroidism   . Liver metastasis ([place])   . Metastasis to kidney ([place])   . PONV (postoperative nausea and vomiting)    NAUSEA  . Renal carcinoma ([place]) 06/2016   ? primary  . Secondary liver cancer ([place]) 06/2016   ? metastasis from colon    Past Surgical History:  Procedure Laterality Date  . ABDOMINAL HYSTERECTOMY  1993   with bilateral BSO  . abdominal tumor  1993  . APPENDECTOMY    . IR GENERIC HISTORICAL  07/01/2016    IR RADIOLOGIST EVAL & MGMT 07/01/2016 Greggory Keen, MD [facility] INTERV RAD  . IR GENERIC HISTORICAL  08/14/2016   IR RADIOLOGIST EVAL & MGMT 08/14/2016 Jacqulynn Cadet, MD [facility] INTERV RAD  . LAPAROSCOPIC PARTIAL COLECTOMY N/A 08/09/2015   Procedure: LAPAROSCOPIC PARTIAL COLECTOMY;  Surgeon: Jackolyn Confer, MD;  Location: [facility];  Service: General;  Laterality: N/A;  . PARTIAL KNEE ARTHROPLASTY  09/09/2012   Procedure: UNICOMPARTMENTAL KNEE;  Surgeon: Mauri Pole, MD;  Location: [facility];  Service: Orthopedics;  Laterality: Left;  . XI ROBOTIC ASSISTED LOWER ANTERIOR RESECTION N/A 01/09/2017   Procedure: XI ROBOTIC ASSISTED LOWER ANTERIOR RESECTION;  Surgeon: Leighton Ruff, MD;  Location: [facility];  Service: General;  Laterality: N/A;    Social History   Socioeconomic History  . Marital status: Divorced    Spouse name: None  . Number of children: None  . Years of education: None  . Highest education level: None  Social Needs  . Financial resource strain: None  . Food insecurity - worry: None  . Food insecurity - inability: None  . Transportation needs - medical: None  . Transportation needs - non-medical: None  Occupational History  . None  Tobacco Use  . Smoking status: Never Smoker  . Smokeless tobacco: Never Used  Substance and Sexual Activity  . Alcohol use: No  . Drug use: No  . Sexual activity: None  Other Topics Concern  . 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 Pancreatic cancer Mother   . Parkinson's disease Mother   . Breast cancer Sister   . Parkinson's disease Brother   . Parkinson's disease Brother   . Colon cancer Neg Hx     Review of Systems  Constitutional: Positive for fatigue and unexpected  weight change (weight loss). Negative for appetite change, chills and fever.  HENT: Positive for rhinorrhea.   Respiratory: Positive for shortness of breath (more than usual). Negative for cough and wheezing.   Cardiovascular: Negative for chest pain, palpitations and leg swelling.  Gastrointestinal: Negative for abdominal pain.  Genitourinary: Negative for dysuria.  Neurological: Negative for light-headedness and headaches.  Psychiatric/Behavioral: Negative for dysphoric mood. The patient is not nervous/anxious.        Objective:   Vitals:   10/12/17 1008  BP: 136/82  Pulse: 62  Resp: 20  Temp: (!) 97.1 F (36.2 C)  SpO2: 98%   Wt Readings from Last 3 Encounters:  10/12/17 187 lb (84.8 kg)  09/25/17 183 lb 3.2 oz (83.1 kg)  06/25/17 176 lb 6.4 oz (80 kg)   Body mass index is 29.29 kg/m.   Physical Exam    Constitutional: Appears well-developed and well-nourished. No distress.  HENT:  Head: Normocephalic and atraumatic.  Neck: Neck supple. No tracheal deviation present. No thyromegaly present.  No cervical lymphadenopathy Cardiovascular: Normal rate, regular rhythm and normal heart sounds.   No murmur heard. No carotid bruit .  No edema Pulmonary/Chest: Effort normal and breath sounds normal. No respiratory distress. No has no wheezes. No rales.  Skin: Skin is warm and dry. Not diaphoretic.  Psychiatric: Normal mood and affect. Behavior is normal.      Assessment & Plan:    See Problem List for Assessment and Plan of chronic medical problems.

## 2017-10-12 ENCOUNTER — Other Ambulatory Visit: Payer: Self-pay | Admitting: Internal Medicine

## 2017-10-12 ENCOUNTER — Other Ambulatory Visit (INDEPENDENT_AMBULATORY_CARE_PROVIDER_SITE_OTHER): Payer: Medicare Other

## 2017-10-12 ENCOUNTER — Encounter: Payer: Self-pay | Admitting: Internal Medicine

## 2017-10-12 ENCOUNTER — Ambulatory Visit (INDEPENDENT_AMBULATORY_CARE_PROVIDER_SITE_OTHER): Payer: Medicare Other | Admitting: Internal Medicine

## 2017-10-12 VITALS — BP 136/82 | HR 62 | Temp 97.1°F | Resp 20 | Ht 67.0 in | Wt 187.0 lb

## 2017-10-12 DIAGNOSIS — Z1231 Encounter for screening mammogram for malignant neoplasm of breast: Secondary | ICD-10-CM

## 2017-10-12 DIAGNOSIS — E038 Other specified hypothyroidism: Secondary | ICD-10-CM

## 2017-10-12 DIAGNOSIS — Z0000 Encounter for general adult medical examination without abnormal findings: Secondary | ICD-10-CM | POA: Diagnosis not present

## 2017-10-12 DIAGNOSIS — Z23 Encounter for immunization: Secondary | ICD-10-CM

## 2017-10-12 DIAGNOSIS — E2839 Other primary ovarian failure: Secondary | ICD-10-CM | POA: Diagnosis not present

## 2017-10-12 LAB — TSH: TSH: 73.69 u[IU]/mL — ABNORMAL HIGH (ref 0.35–4.50)

## 2017-10-12 NOTE — Assessment & Plan Note (Addendum)
Having increased weight and fatigue Check tsh  Titrate med dose if needed

## 2017-10-12 NOTE — Progress Notes (Signed)
Subjective:   Nichole Cross is a 76 y.o. female who presents for an Initial Medicare Annual Wellness Visit.  Review of Systems    No ROS.  Medicare Wellness Visit. Additional risk factors are reflected in the social history.   Cardiac Risk Factors include: advanced age (>46men, >58 women);microalbuminuria Sleep patterns: has interrupted sleep, gets up 1-2 times nightly to void and sleeps 6 hours nightly.  Patient reports insomnia issues, discussed recommended sleep tips.  Home Safety/Smoke Alarms: Feels safe in home. Smoke alarms in place.   Living environment; residence and Firearm Safety: 1-story house/ trailer, no firearms. Lives alone, no needs for DME, good support system Seat Belt Safety/Bike Helmet: Wears seat belt.     Objective:    Today's Vitals   10/12/17 1008  BP: 136/82  Pulse: 62  Resp: 20  Temp: (!) 97.1 F (36.2 C)  SpO2: 98%  Weight: 187 lb (84.8 kg)  Height: 5\' 7"  (1.702 m)   Body mass index is 29.29 kg/m.   Current Medications (verified) Outpatient Encounter Medications as of 10/12/2017  Medication Sig  . acetaminophen (TYLENOL) 500 MG tablet Take 500 mg by mouth.  . diazepam (VALIUM) 2 MG tablet Take 1 tablet (2 mg total) by mouth at bedtime as needed (for sleep).  . diphenhydrAMINE (BENADRYL) 50 MG capsule Take 50 mg by mouth at bedtime as needed.  . ferrous sulfate 325 (65 FE) MG EC tablet Take 325 mg by mouth 3 (three) times daily with meals.  Marland Kitchen levothyroxine (SYNTHROID, LEVOTHROID) 137 MCG tablet Take 1 tablet (137 mcg total) by mouth daily before breakfast.  . loperamide (IMODIUM) 2 MG capsule Take 2 mg by mouth as needed for diarrhea or loose stools.  . polyethylene glycol (MIRALAX / GLYCOLAX) packet Take 17 g by mouth daily as needed for mild constipation. Reported on 06/05/2016  . traMADol (ULTRAM) 50 MG tablet Take 1 tablet (50 mg total) every 6 (six) hours as needed by mouth.  . [DISCONTINUED] prochlorperazine (COMPAZINE) 5 MG tablet  Take 1 tablet (5 mg total) by mouth every 6 (six) hours as needed for nausea or vomiting. (Patient not taking: Reported on 02/26/2017)   No facility-administered encounter medications on file as of 10/12/2017.     Allergies (verified) Aspirin and Morphine and related   History: Past Medical History:  Diagnosis Date  . Anemia   . Arthritis    knees  . Blood dyscrasia 2016   microcytic anemia, likely iron deficiency anemia secondary to colon cancer  . Cataracts, bilateral   . Colon cancer ([place]) 06/2015   tubular villous adenoma on sigmoid colon specimen (08/09/15), myltiple tubular adenomas removed on a colonoscopy  11/16/15  . Complication of anesthesia   . Headache(784.0)    migraines  . Hypothyroidism   . Liver metastasis ([place])   . Metastasis to kidney ([place])   . PONV (postoperative nausea and vomiting)    NAUSEA  . Renal carcinoma ([place]) 06/2016   ? primary  . Secondary liver cancer ([place]) 06/2016   ? metastasis from colon   Past Surgical History:  Procedure Laterality Date  . ABDOMINAL HYSTERECTOMY  1993   with bilateral BSO  . abdominal tumor  1993  . APPENDECTOMY    . IR GENERIC HISTORICAL  07/01/2016   IR RADIOLOGIST EVAL & MGMT 07/01/2016 Greggory Keen, MD [facility] INTERV RAD  . IR GENERIC HISTORICAL  08/14/2016   IR RADIOLOGIST EVAL & MGMT 08/14/2016 Jacqulynn Cadet, MD [facility] INTER[facility] RAD  .  LAPAROSCOPIC PARTIAL COLECTOMY N/A 08/09/2015   Procedure: LAPAROSCOPIC PARTIAL COLECTOMY;  Surgeon: Jackolyn Confer, MD;  [facility];  Service: General;  Laterality: N/A;  . PARTIAL KNEE ARTHROPLASTY  09/09/2012   Procedure: UNICOMPARTMENTAL KNEE;  Surgeon: Mauri Pole, MD;  Location[facility];  Service: Orthopedics;  Laterality: Left;  . XI ROBOTIC ASSISTED LOWER ANTERIOR RESECTION N/A 01/09/2017   Procedure: XI ROBOTIC ASSISTED LOWER ANTERIOR RESECTION;  Surgeon: Leighton Ruff, MD;  [facility];  Service: General;  Laterality: N/A;   Family History  Problem Relation Age  of Onset  . Pancreatic cancer Mother   . Parkinson's disease Mother   . Breast cancer Sister   . Parkinson's disease Brother   . Parkinson's disease Brother   . Colon cancer Neg Hx    Social History   Occupational History  . Not on file  Tobacco Use  . Smoking status: Never Smoker  . Smokeless tobacco: Never Used  Substance and Sexual Activity  . Alcohol use: No  . Drug use: No  . Sexual activity: Not on file    Tobacco Counseling Counseling given: Not Answered   Activities of Daily Living In your present state of health, do you have any difficulty performing the following activities: 10/12/2017 01/09/2017  Hearing? N N  Vision? N N  Difficulty concentrating or making decisions? N N  Walking or climbing stairs? N Y  Dressing or bathing? N N  Doing errands, shopping? N N  Preparing Food and eating ? N -  Using the Toilet? N -  In the past six months, have you accidently leaked urine? N -  Do you have problems with loss of bowel control? N -  Managing your Medications? N -  Managing your Finances? N -  Housekeeping or managing your Housekeeping? N -  Some recent data might be hidden    Immunizations and Health Maintenance Immunization History  Administered Date(s) Administered  . Influenza Whole 08/31/2006, 08/23/2008, 09/02/2010  . Influenza, High Dose Seasonal PF 09/25/2017  . Influenza,inj,Quad PF,6+ Mos 08/08/2016  . Pneumococcal Conjugate-13 10/12/2017  . Pneumococcal Polysaccharide-23 04/13/2009  . Td 03/17/2006   Health Maintenance Due  Topic Date Due  . PNA vac Low Risk Adult (2 of 2 - PCV13) 04/13/2010  . DEXA SCAN  12/03/2015  . TETANUS/TDAP  03/17/2016  . MAMMOGRAM  05/17/2017    Patient Care Team: [name], MD as PCP - General (Internal Medicine) Ladell Pier, MD as Consulting Physician (Oncology)  Indicate any recent Medical Services you may have received from other than [facility] providers in the past year (date may be approximate).      Assessment:   This is a routine wellness examination for [facility].Physical assessment deferred to PCP.   Hearing/Vision screen Hearing Screening Comments: Able to hear conversational tones w/o difficulty. No issues reported.  Passed whisper test  Vision Screening Comments: Goes to [facility], patient states she is going to make an eye appoint soon.  Dietary issues and exercise activities discussed: Current Exercise Habits: Home exercise routine, Type of exercise: walking, Time (Minutes): 30, Frequency (Times/Week): 7, Weekly Exercise (Minutes/Week): 210, Intensity: Mild, Exercise limited by: None identified Diet (meal preparation, eat out, water intake, caffeinated beverages, dairy products, fruits and vegetables): in general, a "healthy" diet  , well balanced, recently completed chemotherapy and states appetite is improving.   Discussed using supplements to improve nutrition and ensure coupons were provided, encouraged patient to increase daily water intake.  Goals    . Maintain current health status     Continue to work with children, walk my dog, and enjoy family and life.      Depression Screen PHQ 2/9 Scores 10/12/2017 07/14/2016  PHQ - 2 Score 1 0  PHQ- 9 Score 5 -    Fall Risk Fall Risk  10/12/2017 07/14/2016  Falls in the past year? Yes No  Number falls in past yr: 1 -    Cognitive Function: MMSE - Mini Mental State Exam 10/12/2017  Orientation to time 5  Orientation to Place 5  Registration 3  Attention/ Calculation 5  Recall 2  Language- name 2 objects 2  Language- repeat 1  Language- follow 3 step command 3  Language- read & follow direction 1  Write a sentence 1  Copy design 1  Total score 29        Screening Tests Health Maintenance  Topic Date Due  . PNA vac Low Risk Adult (2 of 2 - PCV13) 04/13/2010  . DEXA SCAN  12/03/2015  . TETANUS/TDAP  03/17/2016  . MAMMOGRAM  05/17/2017  . COLONOSCOPY  10/30/2017  . INFLUENZA VACCINE   Completed      Plan:    Continue doing brain stimulating activities (puzzles, reading, adult coloring books, staying active) to keep memory sharp.   Continue to eat heart healthy diet (full of fruits, vegetables, whole grains, lean protein, water--limit salt, fat, and sugar intake) and increase physical activity as tolerated.  I have personally reviewed and noted the following in the patient's chart:   . Medical and social history . Use of alcohol, tobacco or illicit drugs  . Current medications and supplements . Functional ability and status . Nutritional status . Physical activity . Advanced directives . List of other physicians . Vitals . Screenings to include cognitive, depression, and falls . Referrals and appointments  In addition, I have reviewed and discussed with patient certain preventive protocols, quality metrics, and best practice recommendations. A written personalized care plan for preventive services as well as general preventive health recommendations were provided to patient.     Michiel Cowboy, RN   10/12/2017    Medical screening examination/treatment/procedure(s) were performed by non-physician practitioner and as supervising physician I was immediately available for consultation/collaboration. I agree with above. [name], MD

## 2017-10-12 NOTE — Telephone Encounter (Signed)
Thyroid function is extremely low -- we need to increase her medication significantly, but I am unsure what does she will need long term.  I want her to take 200 mcg daily for the next 6 weeks then have her tsh rechecked.    tsh ordered Levothyroxine 200 mcg pending - confirm pharmacy and send - set up for 30 days.

## 2017-10-13 ENCOUNTER — Other Ambulatory Visit: Payer: Self-pay | Admitting: Internal Medicine

## 2017-10-13 MED ORDER — LEVOTHYROXINE SODIUM 200 MCG PO TABS
200.0000 ug | ORAL_TABLET | Freq: Every day | ORAL | 1 refills | Status: DC
Start: 2017-10-13 — End: 2018-01-07

## 2017-10-13 NOTE — Telephone Encounter (Signed)
LVM advising pt RX has been sent to [facility] for higher dose and to come back in 6 weeks for blood work.

## 2017-10-14 ENCOUNTER — Telehealth: Payer: Self-pay | Admitting: *Deleted

## 2017-10-14 NOTE — Telephone Encounter (Signed)
-----   Message from Jerene Bears, MD sent at 10/14/2017 10:31 AM EST ----- Regarding: FW: Colonoscopy Dr. Benay Spice does not recommend surveillance at this time and will let us know based on further testing when to repeat colonoscopy (all based on treatment response) JMP  ----- Message ----- From: Ladell Pier, MD Sent: 10/13/2017   8:24 PM To: Jerene Bears, MD Subject: RE: Colonoscopy                                I would normally not recommend a surveillance colonoscopy this soon.  She had the unusual local recurrence earlier this year and underwent low anterior resection.  Her most recent CEA was elevated.  If the CEA continues to rise I will get a PET scan.  I will refer her back to you if the CEA is rising and remains unexplained  Thanks,  Leroy Sea ----- Message ----- From: Jerene Bears, MD Sent: 10/12/2017  12:13 PM To: Ladell Pier, MD Subject: Colonoscopy                                    Nichole Cross, Nichole Cross' name came up for colonoscopy recall. I know she has metastatic disease. I will defer to you regarding need for surveillance colonoscopy at this time? Appreciate your thoughts and recommendation Thanks Nichole Cross

## 2017-10-14 NOTE — Telephone Encounter (Signed)
Per Dr Hilarie Fredrickson verbal orders, cancel recall colonoscopy requests in Epic for now.

## 2017-10-16 ENCOUNTER — Other Ambulatory Visit: Payer: Self-pay | Admitting: Internal Medicine

## 2017-10-16 DIAGNOSIS — Z1231 Encounter for screening mammogram for malignant neoplasm of breast: Secondary | ICD-10-CM

## 2017-10-27 ENCOUNTER — Other Ambulatory Visit: Payer: Medicare Other

## 2017-11-02 ENCOUNTER — Telehealth: Payer: Self-pay

## 2017-11-02 NOTE — Telephone Encounter (Signed)
Patient called to inquire about rescheduling appointments. Called patient to find which days work best for next week to reschedule lab appointment. Informed patient that she will receive call from scheduling with date and time. Patient voiced understanding.

## 2017-11-02 NOTE — Telephone Encounter (Signed)
Called and left a message with lab appt per 121/7 inbasket

## 2017-11-09 ENCOUNTER — Other Ambulatory Visit: Payer: Self-pay | Admitting: *Deleted

## 2017-11-09 DIAGNOSIS — C187 Malignant neoplasm of sigmoid colon: Secondary | ICD-10-CM

## 2017-11-11 ENCOUNTER — Other Ambulatory Visit (HOSPITAL_BASED_OUTPATIENT_CLINIC_OR_DEPARTMENT_OTHER): Payer: Medicare Other

## 2017-11-11 DIAGNOSIS — Z85038 Personal history of other malignant neoplasm of large intestine: Secondary | ICD-10-CM | POA: Diagnosis not present

## 2017-11-11 DIAGNOSIS — C20 Malignant neoplasm of rectum: Secondary | ICD-10-CM

## 2017-11-11 DIAGNOSIS — D509 Iron deficiency anemia, unspecified: Secondary | ICD-10-CM | POA: Diagnosis not present

## 2017-11-11 LAB — CBC WITH DIFFERENTIAL/PLATELET
BASO%: 1.3 % (ref 0.0–2.0)
Basophils Absolute: 0 10*3/uL (ref 0.0–0.1)
EOS%: 3.2 % (ref 0.0–7.0)
Eosinophils Absolute: 0.1 10*3/uL (ref 0.0–0.5)
HCT: 34.4 % — ABNORMAL LOW (ref 34.8–46.6)
HEMOGLOBIN: 10.8 g/dL — AB (ref 11.6–15.9)
LYMPH#: 1 10*3/uL (ref 0.9–3.3)
LYMPH%: 31.9 % (ref 14.0–49.7)
MCH: 24.4 pg — ABNORMAL LOW (ref 25.1–34.0)
MCHC: 31.4 g/dL — ABNORMAL LOW (ref 31.5–36.0)
MCV: 77.8 fL — ABNORMAL LOW (ref 79.5–101.0)
MONO#: 0.3 10*3/uL (ref 0.1–0.9)
MONO%: 10 % (ref 0.0–14.0)
NEUT%: 53.6 % (ref 38.4–76.8)
NEUTROS ABS: 1.7 10*3/uL (ref 1.5–6.5)
Platelets: 193 10*3/uL (ref 145–400)
RBC: 4.42 10*6/uL (ref 3.70–5.45)
RDW: 19.6 % — AB (ref 11.2–14.5)
WBC: 3.1 10*3/uL — AB (ref 3.9–10.3)

## 2017-11-11 LAB — CEA (IN HOUSE-CHCC): CEA (CHCC-IN HOUSE): 10.99 ng/mL — AB (ref 0.00–5.00)

## 2017-11-11 LAB — FERRITIN: Ferritin: 19 ng/ml (ref 9–269)

## 2017-11-13 ENCOUNTER — Other Ambulatory Visit: Payer: Self-pay | Admitting: Emergency Medicine

## 2017-11-13 ENCOUNTER — Telehealth: Payer: Self-pay | Admitting: Emergency Medicine

## 2017-11-13 ENCOUNTER — Telehealth: Payer: Self-pay | Admitting: Oncology

## 2017-11-13 DIAGNOSIS — C187 Malignant neoplasm of sigmoid colon: Secondary | ICD-10-CM

## 2017-11-13 NOTE — Telephone Encounter (Addendum)
CT scheduled for 1/28. Pt instructions given for contrast. Scheduling message sent for CMET to be done before scan and to see Dr.Sherrill on 1/29 after scan is complete on 1/28. Patient verbalized understanding of this.   ----- Message from Ladell Pier, MD sent at 11/12/2017 10:02 PM EST ----- Please call patient, cea is mildly elevated, schedule CTs chest and abd/pelvis approx 1 month with office 1-2 days after CTs

## 2017-11-13 NOTE — Telephone Encounter (Signed)
Left message for patient regarding upcoming January 2019 appointments.  °

## 2017-11-16 ENCOUNTER — Telehealth: Payer: Self-pay

## 2017-11-16 NOTE — Telephone Encounter (Signed)
Returned call to patient to inform that appointments on 11/25/17 have been cancelled. Patient voiced understanding and is aware of appointments on 1/28 and 1/29

## 2017-11-18 ENCOUNTER — Ambulatory Visit
Admission: RE | Admit: 2017-11-18 | Discharge: 2017-11-18 | Disposition: A | Discharge status: Home / Self Care | Payer: Medicare Other | Source: Ambulatory Visit | Attending: Internal Medicine | Admitting: Internal Medicine

## 2017-11-18 DIAGNOSIS — E2839 Other primary ovarian failure: Secondary | ICD-10-CM

## 2017-11-18 DIAGNOSIS — Z1231 Encounter for screening mammogram for malignant neoplasm of breast: Secondary | ICD-10-CM

## 2017-11-19 ENCOUNTER — Encounter: Payer: Self-pay | Admitting: Internal Medicine

## 2017-11-24 ENCOUNTER — Other Ambulatory Visit (INDEPENDENT_AMBULATORY_CARE_PROVIDER_SITE_OTHER): Payer: Medicare Other

## 2017-11-24 ENCOUNTER — Other Ambulatory Visit: Payer: Medicare Other

## 2017-11-24 DIAGNOSIS — E038 Other specified hypothyroidism: Secondary | ICD-10-CM

## 2017-11-24 LAB — TSH: TSH: 17.86 u[IU]/mL — ABNORMAL HIGH (ref 0.35–4.50)

## 2017-11-25 ENCOUNTER — Other Ambulatory Visit: Payer: Medicare Other

## 2017-11-25 ENCOUNTER — Ambulatory Visit: Payer: Medicare Other | Admitting: Nurse Practitioner

## 2017-11-27 ENCOUNTER — Other Ambulatory Visit: Payer: Self-pay | Admitting: Emergency Medicine

## 2017-11-27 DIAGNOSIS — E038 Other specified hypothyroidism: Secondary | ICD-10-CM

## 2017-11-27 MED ORDER — LEVOTHYROXINE SODIUM 25 MCG PO TABS: ORAL_TABLET | ORAL | 1 refills | Status: DC

## 2017-12-11 ENCOUNTER — Other Ambulatory Visit: Payer: Self-pay

## 2017-12-11 DIAGNOSIS — C787 Secondary malignant neoplasm of liver and intrahepatic bile duct: Principal | ICD-10-CM

## 2017-12-11 DIAGNOSIS — C189 Malignant neoplasm of colon, unspecified: Secondary | ICD-10-CM

## 2017-12-14 ENCOUNTER — Ambulatory Visit (HOSPITAL_COMMUNITY)
Admission: RE | Admit: 2017-12-14 | Discharge: 2017-12-14 | Disposition: A | Discharge status: Home / Self Care | Payer: Medicare Other | Source: Ambulatory Visit | Attending: Oncology | Admitting: Oncology

## 2017-12-14 ENCOUNTER — Inpatient Hospital Stay: Payer: Medicare Other | Attending: Oncology

## 2017-12-14 ENCOUNTER — Encounter (HOSPITAL_COMMUNITY): Payer: Self-pay

## 2017-12-14 DIAGNOSIS — N2889 Other specified disorders of kidney and ureter: Secondary | ICD-10-CM | POA: Insufficient documentation

## 2017-12-14 DIAGNOSIS — D739 Disease of spleen, unspecified: Secondary | ICD-10-CM | POA: Diagnosis not present

## 2017-12-14 DIAGNOSIS — R590 Localized enlarged lymph nodes: Secondary | ICD-10-CM | POA: Insufficient documentation

## 2017-12-14 DIAGNOSIS — C187 Malignant neoplasm of sigmoid colon: Secondary | ICD-10-CM | POA: Diagnosis present

## 2017-12-14 DIAGNOSIS — M5137 Other intervertebral disc degeneration, lumbosacral region: Secondary | ICD-10-CM | POA: Diagnosis not present

## 2017-12-14 DIAGNOSIS — C787 Secondary malignant neoplasm of liver and intrahepatic bile duct: Secondary | ICD-10-CM | POA: Diagnosis not present

## 2017-12-14 DIAGNOSIS — I700 Atherosclerosis of aorta: Secondary | ICD-10-CM | POA: Insufficient documentation

## 2017-12-14 DIAGNOSIS — D509 Iron deficiency anemia, unspecified: Secondary | ICD-10-CM | POA: Diagnosis not present

## 2017-12-14 DIAGNOSIS — K7460 Unspecified cirrhosis of liver: Secondary | ICD-10-CM | POA: Diagnosis not present

## 2017-12-14 DIAGNOSIS — Z79899 Other long term (current) drug therapy: Secondary | ICD-10-CM | POA: Diagnosis not present

## 2017-12-14 DIAGNOSIS — E038 Other specified hypothyroidism: Secondary | ICD-10-CM

## 2017-12-14 LAB — COMPREHENSIVE METABOLIC PANEL
ALBUMIN: 3.9 g/dL (ref 3.5–5.0)
ALK PHOS: 88 U/L (ref 40–150)
ALT: 14 U/L (ref 0–55)
AST: 25 U/L (ref 5–34)
Anion gap: 9 (ref 3–11)
BUN: 12 mg/dL (ref 7–26)
CALCIUM: 9.4 mg/dL (ref 8.4–10.4)
CHLORIDE: 105 mmol/L (ref 98–109)
CO2: 26 mmol/L (ref 22–29)
Creatinine, Ser: 1.06 mg/dL (ref 0.60–1.10)
GFR calc non Af Amer: 50 mL/min — ABNORMAL LOW (ref >60)
GFR, EST AFRICAN AMERICAN: 58 mL/min — AB (ref >60)
Glucose, Bld: 100 mg/dL (ref 70–140)
Potassium: 3.5 mmol/L (ref 3.3–4.7)
SODIUM: 140 mmol/L (ref 136–145)
Total Bilirubin: 0.4 mg/dL (ref 0.2–1.2)
Total Protein: 7.7 g/dL (ref 6.4–8.3)

## 2017-12-14 LAB — TSH: TSH: 0.147 u[IU]/mL — ABNORMAL LOW (ref 0.308–3.960)

## 2017-12-14 LAB — CEA (IN HOUSE-CHCC): CEA (CHCC-IN HOUSE): 2.53 ng/mL (ref 0.00–5.00)

## 2017-12-14 MED ORDER — IOPAMIDOL (ISOVUE-300) INJECTION 61%
INTRAVENOUS | Status: AC
  Filled 2017-12-14: qty 100

## 2017-12-14 MED ORDER — IOPAMIDOL (ISOVUE-300) INJECTION 61%
100.0000 mL | Freq: Once | INTRAVENOUS | Status: AC | PRN
  Administered 2017-12-14: 100 mL via INTRAVENOUS

## 2017-12-15 ENCOUNTER — Encounter: Payer: Self-pay | Admitting: Oncology

## 2017-12-15 ENCOUNTER — Inpatient Hospital Stay (HOSPITAL_BASED_OUTPATIENT_CLINIC_OR_DEPARTMENT_OTHER): Payer: Medicare Other | Admitting: Oncology

## 2017-12-15 ENCOUNTER — Telehealth: Payer: Self-pay

## 2017-12-15 VITALS — BP 134/49 | HR 63 | Temp 97.7°F | Resp 18 | Ht 67.0 in | Wt 174.7 lb

## 2017-12-15 DIAGNOSIS — C187 Malignant neoplasm of sigmoid colon: Secondary | ICD-10-CM

## 2017-12-15 DIAGNOSIS — C787 Secondary malignant neoplasm of liver and intrahepatic bile duct: Secondary | ICD-10-CM

## 2017-12-15 DIAGNOSIS — C189 Malignant neoplasm of colon, unspecified: Secondary | ICD-10-CM

## 2017-12-15 DIAGNOSIS — N2889 Other specified disorders of kidney and ureter: Secondary | ICD-10-CM | POA: Diagnosis not present

## 2017-12-15 DIAGNOSIS — D509 Iron deficiency anemia, unspecified: Secondary | ICD-10-CM | POA: Diagnosis not present

## 2017-12-15 NOTE — Telephone Encounter (Signed)
Printed avs and calender for upcoming appointment. Per 1/29 los 

## 2017-12-15 NOTE — Progress Notes (Signed)
[facility] OFFICE PROGRESS NOTE   Diagnosis: Colon cancer  INTERVAL HISTORY:    [name] returns as scheduled.  She feels well.  She is working.  Good appetite.  She is taking iron once daily.  She has intermittent constipation. Objective:  Vital signs in last 24 hours:  Blood pressure (!) 134/49, pulse 63, temperature 97.7 F (36.5 C), temperature source Oral, resp. rate 18, height 5' 7" (1.702 m), weight 174 lb 11.2 oz (79.2 kg), SpO2 100 %.    HEENT: Neck without mass Lymphatics: No cervical or supraclavicular nodes.  1/2 cm soft mobile right axillary node.  No inguinal nodes. Resp: Lungs clear bilaterally Cardio: Regular rate and rhythm GI: No hepatomegaly, no mass, nontender Vascular: No leg edema   Lab Results:  Lab Results  Component Value Date   WBC 3.1 (L) 11/11/2017   HGB 10.8 (L) 11/11/2017   HCT 34.4 (L) 11/11/2017   MCV 77.8 (L) 11/11/2017   PLT 193 11/11/2017   NEUTROABS 1.7 11/11/2017    CMP     Component Value Date/Time   NA 140 12/14/2017 0859   NA 138 05/27/2017 1408   K 3.5 12/14/2017 0859   K 3.9 05/27/2017 1408   CL 105 12/14/2017 0859   CO2 26 12/14/2017 0859   CO2 23 05/27/2017 1408   GLUCOSE 100 12/14/2017 0859   GLUCOSE 93 05/27/2017 1408   BUN 12 12/14/2017 0859   BUN 13.8 05/27/2017 1408   CREATININE 1.06 12/14/2017 0859   CREATININE 1.1 05/27/2017 1408   CALCIUM 9.4 12/14/2017 0859   CALCIUM 9.5 05/27/2017 1408   PROT 7.7 12/14/2017 0859   PROT 7.3 05/27/2017 1408   ALBUMIN 3.9 12/14/2017 0859   ALBUMIN 4.0 05/27/2017 1408   AST 25 12/14/2017 0859   AST 20 05/27/2017 1408   ALT 14 12/14/2017 0859   ALT 8 05/27/2017 1408   ALKPHOS 88 12/14/2017 0859   ALKPHOS 78 05/27/2017 1408   BILITOT 0.4 12/14/2017 0859   BILITOT 0.53 05/27/2017 1408   GFRNONAA 50 (L) 12/14/2017 0859   GFRAA 58 (L) 12/14/2017 0859    Lab Results  Component Value Date   CEA1 2.53 12/14/2017     Imaging:  Ct Chest W  Contrast  Result Date: 12/14/2017 CLINICAL DATA:  Restaging of sigmoid colon cancer, chemotherapy completed 6 months ago. Intermittent constipation and diarrhea. Prior lower anterior resection. Right renal mass. EXAM: CT CHEST, ABDOMEN, AND PELVIS WITH CONTRAST TECHNIQUE: Multidetector CT imaging of the chest, abdomen and pelvis was performed following the standard protocol during bolus administration of intravenous contrast. CONTRAST:  165m ISOVUE-300 IOPAMIDOL (ISOVUE-300) INJECTION 61% COMPARISON:  06/24/2017 FINDINGS: CT CHEST FINDINGS Cardiovascular: Minimal atherosclerotic calcification of the aortic arch. Mediastinum/Nodes: No thoracic adenopathy. Lungs/Pleura: Biapical pleuroparenchymal scarring, stable. No findings suspicious for active pulmonary malignancy. Clustered tree-in-bud nodularity posteriorly in the left lower lobe on image 72/6, likely a chronic postinflammatory finding. Musculoskeletal: Unremarkable CT ABDOMEN PELVIS FINDINGS Hepatobiliary: Stable 1.4 by 1.1 cm hypodense lesion in the lateral segment left hepatic lobe on image 56/2 compatible with prior RF ablation, no findings of intrahepatic recurrence. Similar lenticular hypodense lesions posteriorly in the right hepatic lobe including a 1.9 by 1.0 cm lesion on image 61/2 (formerly the same by my measurements) and a 1.8 by 1.0 cm posterior lesion on image 61/2 (formerly the same by my measurements). No new liver lesions. Modified caudate to right hepatic lobe ratio is abnormally elevated suggesting cirrhosis. Pancreas: Unremarkable Spleen: Hypodense lesion  in the splenic parenchyma near the splenic hilum 0.9 cm in short axis, stable. Upper normal splenic size. Adrenals/Urinary Tract: Fullness of the left adrenal gland is chronically stable. Solid appearing right kidney upper pole mass anteriorly measures 2.6 by 2.4 cm on image 58/2. Not changed from 06/24/2017. Stomach/Bowel: Anastomotic staple line in the rectum. Stable adjacent presacral  scarring. Air fluid levels in the descending colon which may indicate diarrheal process. 0.8 by 1.0 cm nodular density along the left paracolic gutter on image 63/0 could be a lymph node, diverticulum, or soft tissue tumor deposit. Vascular/Lymphatic: No tumor thrombus in the right renal vein. Porta hepatis lymph node 2.0 cm in short axis on image 60/2, formerly 1.8 cm. Reproductive: Uterus absent.  Adnexa unremarkable. Other: No supplemental non-categorized findings. Musculoskeletal: Degenerative disc disease at L5-S1 with loss of disc height. IMPRESSION: 1. No change in the appearance of the liver ablation sites to suggest recurrent hepatic tumor. 2. A 0.8 by 1.0 cm lymph node, soft tissue deposit, or conceivably diverticulum adjacent to the descending colon along the paracolic gutter. Diverticulum is considered less likely given the appearance. Although potentially benign this merits surveillance. 3. Stable 2.6 cm solid mass in the right kidney upper pole, likely a renal cell carcinoma. No tumor thrombus in the right renal vein. 4. Early cirrhosis. Enlarged porta hepatis lymph node at 2.0 cm is most likely reactive due to the cirrhosis but technically not entirely specific. 5. Other imaging findings of potential clinical significance: Aortic Atherosclerosis (ICD10-I70.0). Stable chronic splenic hypodense lesion, likely a cyst. Degenerative disc disease at L5-S1. Electronically Signed   By: Van Clines M.D.   On: 12/14/2017 13:27   Ct Abdomen Pelvis W Contrast  Result Date: 12/14/2017 CLINICAL DATA:  Restaging of sigmoid colon cancer, chemotherapy completed 6 months ago. Intermittent constipation and diarrhea. Prior lower anterior resection. Right renal mass. EXAM: CT CHEST, ABDOMEN, AND PELVIS WITH CONTRAST TECHNIQUE: Multidetector CT imaging of the chest, abdomen and pelvis was performed following the standard protocol during bolus administration of intravenous contrast. CONTRAST:  164m ISOVUE-300  IOPAMIDOL (ISOVUE-300) INJECTION 61% COMPARISON:  06/24/2017 FINDINGS: CT CHEST FINDINGS Cardiovascular: Minimal atherosclerotic calcification of the aortic arch. Mediastinum/Nodes: No thoracic adenopathy. Lungs/Pleura: Biapical pleuroparenchymal scarring, stable. No findings suspicious for active pulmonary malignancy. Clustered tree-in-bud nodularity posteriorly in the left lower lobe on image 72/6, likely a chronic postinflammatory finding. Musculoskeletal: Unremarkable CT ABDOMEN PELVIS FINDINGS Hepatobiliary: Stable 1.4 by 1.1 cm hypodense lesion in the lateral segment left hepatic lobe on image 56/2 compatible with prior RF ablation, no findings of intrahepatic recurrence. Similar lenticular hypodense lesions posteriorly in the right hepatic lobe including a 1.9 by 1.0 cm lesion on image 61/2 (formerly the same by my measurements) and a 1.8 by 1.0 cm posterior lesion on image 61/2 (formerly the same by my measurements). No new liver lesions. Modified caudate to right hepatic lobe ratio is abnormally elevated suggesting cirrhosis. Pancreas: Unremarkable Spleen: Hypodense lesion in the splenic parenchyma near the splenic hilum 0.9 cm in short axis, stable. Upper normal splenic size. Adrenals/Urinary Tract: Fullness of the left adrenal gland is chronically stable. Solid appearing right kidney upper pole mass anteriorly measures 2.6 by 2.4 cm on image 58/2. Not changed from 06/24/2017. Stomach/Bowel: Anastomotic staple line in the rectum. Stable adjacent presacral scarring. Air fluid levels in the descending colon which may indicate diarrheal process. 0.8 by 1.0 cm nodular density along the left paracolic gutter on image 716/0could be a lymph node, diverticulum, or soft tissue  tumor deposit. Vascular/Lymphatic: No tumor thrombus in the right renal vein. Porta hepatis lymph node 2.0 cm in short axis on image 60/2, formerly 1.8 cm. Reproductive: Uterus absent.  Adnexa unremarkable. Other: No supplemental  non-categorized findings. Musculoskeletal: Degenerative disc disease at L5-S1 with loss of disc height. IMPRESSION: 1. No change in the appearance of the liver ablation sites to suggest recurrent hepatic tumor. 2. A 0.8 by 1.0 cm lymph node, soft tissue deposit, or conceivably diverticulum adjacent to the descending colon along the paracolic gutter. Diverticulum is considered less likely given the appearance. Although potentially benign this merits surveillance. 3. Stable 2.6 cm solid mass in the right kidney upper pole, likely a renal cell carcinoma. No tumor thrombus in the right renal vein. 4. Early cirrhosis. Enlarged porta hepatis lymph node at 2.0 cm is most likely reactive due to the cirrhosis but technically not entirely specific. 5. Other imaging findings of potential clinical significance: Aortic Atherosclerosis (ICD10-I70.0). Stable chronic splenic hypodense lesion, likely a cyst. Degenerative disc disease at L5-S1. Electronically Signed   By: Van Clines M.D.   On: 12/14/2017 13:27    Medications: I have reviewed the patient's current medications.   Assessment/Plan: 1. Adenocarcinoma the distal sigmoid colon, poorly differentiated, stage II (T3 N0), status post a laparoscopic assisted sigmoid colectomy 08/09/2015  No loss of mismatch repair protein expression  Elevated preoperative CEA  Staging CT of the abdomen and pelvis 06/22/2015 with no evidence of distant metastatic disease  Mildly elevated CEA 02/23/2017And 04/17/2016  CTs chest, abdomen, and pelvis on 06/04/2016, 47m left upper lobe nodule-no comparison available, new hypodense lesion in the left liver, solid Right renal mass  MRI 06/10/2016-2 enhancing lesions in the liver consistent with metastatic disease, segment 2 and segment 5. Solid enhancing right renal lesion consistent with a renal cell carcinoma  Radiofrequency ablation of 2 liver lesions on 07/25/2016  Cycle 1 adjuvant Xeloda 08/18/2016  Cycle 2 Xeloda  09/08/2016  CEA improved 09/26/2016  Cycle 3 Xeloda held 09/29/2016 due to unexplained abdominal pain,referred for CT  CT abdomen/pelvis 09/30/2016 with a long segment of bowel wall thickening involving the distal ileum; lesion left hepatic lobe similar to slightly smaller following ablation; lesion posterior right hepatic lobe elongated favored to represent treatment tract.  Cycle 3 Xeloda 10/10/2016 (dose reduced due to drug induced enteritis following cycle 2)discontinued 10/15/2016 secondary to abdominal pain.  CT in while the emergency room with abdominal pain 10/15/2016 revealed descending colitis  Colonoscopy 10/30/2016 confirmed a mass at the: colo-colonicanastomosis  Cycle 4 Xeloda 11/03/2016  01/09/2017 status post a low anterior resection for removal of locally recurrent tumor at the sigmoid anastomosis, resection margins negative, tumor invades pericolonic soft tissue, 12 benign lymph nodes;MSI stabl  Cycle 5 Xeloda 02/12/2017  Cycle 6 Xeloda 03/12/2017, Xeloda dose reduced to 1000 mg twice daily on 03/18/2017  Cycle 7 Xeloda 04/09/2017  Cycle 8 Xeloda 05/12/2017  Restaging CTs 06/24/2017-no evidence of metastatic disease involving the chest. Post ablation changes in the liver. No new hepatic metastatic disease.  Restaging CTs 12/14/2017- stable liver ablation sites, indeterminate nodule adjacent to the descending colon, stable left renal mass, changes of early cirrhosis, enlarged porta hepatis node-likely reactive   2.History ofMicrocytic anemia-likely iron deficiency anemia secondary to colon cancer, persistent microcytic anemia  3.Tubular villous adenoma on the sigmoid colon resection specimen 08/09/2015  Multiple tubular adenomas removed on a colonoscopy 11/16/2015  4. Right renal mass on CT 06/04/2016-suspicious for renal cell carcinoma;stable on CT 06/24/2017;referred to urology  5.  History ofneutropenia secondary to  chemotherapy   Disposition: She appears well.  The restaging CT shows no clear evidence of disease progression.  I reviewed the CT images with Ms. Creswell and her family.  The tiny nodular area adjacent to the descending colon is nonspecific.  The CEA is now normal.  She will see Dr. Laurence Ferrari next week.  We will make a referral to urology to consider management options for the left renal mass.  She will increase the ferrous sulfate to twice daily.  We will check a CBC and ferritin when she returns in 3 months.  She will return for an office visit and CEA in 3 months.  25 minutes were spent with the patient today.  The majority of the time was used for counseling and coordination of care.    [name], MD  12/15/2017  1:29 PM

## 2017-12-24 ENCOUNTER — Ambulatory Visit
Admission: RE | Admit: 2017-12-24 | Discharge: 2017-12-24 | Disposition: A | Discharge status: Home / Self Care | Payer: Medicare Other | Source: Ambulatory Visit | Attending: Interventional Radiology | Admitting: Interventional Radiology

## 2017-12-24 ENCOUNTER — Encounter: Payer: Self-pay | Admitting: Radiology

## 2017-12-24 DIAGNOSIS — C641 Malignant neoplasm of right kidney, except renal pelvis: Secondary | ICD-10-CM

## 2017-12-24 DIAGNOSIS — C787 Secondary malignant neoplasm of liver and intrahepatic bile duct: Secondary | ICD-10-CM

## 2017-12-24 HISTORY — PX: IR RADIOLOGIST EVAL & MGMT: IMG5224

## 2017-12-24 NOTE — Progress Notes (Signed)
Chief Complaint: Patient was seen in follow-up today for  Chief Complaint  Patient presents with  . Follow-up    Follow up [facility] (07/25/2016)   at the request of [place]  Referring Physician(s): Dr. Benay Spice  History of Present Illness: Nichole Cross is a 77 y.o. female with a history of metastatic colon cancer ( 2 hepatic lesions) as well as a putative right sided RCC.  She underwent microwave ablation of her segment 2 and 6 liver lesions on 07-25-16.  On surveillance, she was found to have local recurrence in her pelvis and underwent additional chemotherapy and repeat surgery.  Now recovered, she presents for follow-up evaluation. Her most recent imaging was a CT scan of the chest, abdomen and pelvis performed in late January 2019. She is currently feeling very well and is back to work. She has no active complaints at this time.   Past Medical History:  Diagnosis Date  . Anemia   . Arthritis    knees  . Blood dyscrasia 2016   microcytic anemia, likely iron deficiency anemia secondary to colon cancer  . Cataracts, bilateral   . Colon cancer ([place]) 06/2015   tubular villous adenoma on sigmoid colon specimen (08/09/15), myltiple tubular adenomas removed on a colonoscopy  11/16/15  . Complication of anesthesia   . Headache(784.0)    migraines  . Hypothyroidism   . Liver metastasis ([place])   . Metastasis to kidney ([place])   . PONV (postoperative nausea and vomiting)    NAUSEA  . Renal carcinoma ([place]) 06/2016   ? primary  . Secondary liver cancer ([place]) 06/2016   ? metastasis from colon    Past Surgical History:  Procedure Laterality Date  . ABDOMINAL HYSTERECTOMY  1993   with bilateral BSO  . abdominal tumor  1993  . APPENDECTOMY    . IR GENERIC HISTORICAL  07/01/2016   IR RADIOLOGIST EVAL & MGMT 07/01/2016 Greggory Keen, MD [facility] INTER[facility] RAD  . IR GENERIC HISTORICAL  08/14/2016   IR RADIOLOGIST EVAL & MGMT 08/14/2016 Jacqulynn Cadet, MD [facility] INTERV RAD    . LAPAROSCOPIC PARTIAL COLECTOMY N/A 08/09/2015   Procedure: LAPAROSCOPIC PARTIAL COLECTOMY;  Surgeon: Jackolyn Confer, MD;  Location: [facility];  Service: General;  Laterality: N/A;  . PARTIAL KNEE ARTHROPLASTY  09/09/2012   Procedure: UNICOMPARTMENTAL KNEE;  Surgeon: Mauri Pole, MD;  Location: [facility];  Service: Orthopedics;  Laterality: Left;  . XI ROBOTIC ASSISTED LOWER ANTERIOR RESECTION N/A 01/09/2017   Procedure: XI ROBOTIC ASSISTED LOWER ANTERIOR RESECTION;  Surgeon: Leighton Ruff, MD;  Location: [facility];  Service: General;  Laterality: N/A;    Allergies: Aspirin and Morphine and related  Medications: Prior to Admission medications   Medication Sig Start Date End Date Taking? Authorizing Provider  acetaminophen (TYLENOL) 500 MG tablet Take 500 mg by mouth.   Yes [provider]  ferrous sulfate 325 (65 FE) MG EC tablet Take 325 mg by mouth 3 (three) times daily with meals.   Yes [provider]  levothyroxine (SYNTHROID) 200 MCG tablet Take 1 tablet (200 mcg total) by mouth daily before breakfast. 10/13/17  Yes Burns, Claudina Lick, MD  levothyroxine (SYNTHROID, LEVOTHROID) 25 MCG tablet Take 1 tablet daily in addition to 200 mcg tablet. 11/27/17  Yes Burns, Claudina Lick, MD  loperamide (IMODIUM) 2 MG capsule Take 2 mg by mouth as needed for diarrhea or loose stools.   Yes [provider]  polyethylene glycol (MIRALAX / GLYCOLAX) packet Take  17 g by mouth daily as needed for mild constipation. Reported on 06/05/2016   Yes [provider]  traMADol (ULTRAM) 50 MG tablet Take 1 tablet (50 mg total) every 6 (six) hours as needed by mouth. 09/25/17  Yes Ladell Pier, MD  diazepam (VALIUM) 2 MG tablet Take 1 tablet (2 mg total) by mouth at bedtime as needed (for sleep). Patient not taking: Reported on 12/24/2017 04/22/17   Owens Shark, NP  diphenhydrAMINE (BENADRYL) 50 MG capsule Take 50 mg by mouth at bedtime as needed.    [provider]     Family  History  Problem Relation Age of Onset  . Pancreatic cancer Mother   . Parkinson's disease Mother   . Breast cancer Sister   . Parkinson's disease Brother   . Parkinson's disease Brother   . Colon cancer Neg Hx     Social History   Socioeconomic History  . Marital status: Divorced    Spouse name: Not on file  . Number of children: Not on file  . Years of education: Not on file  . Highest education level: Not on file  Social Needs  . Financial resource strain: Not on file  . Food insecurity - worry: Not on file  . Food insecurity - inability: Not on file  . Transportation needs - medical: Not on file  . Transportation needs - non-medical: Not on file  Occupational History  . Not on file  Tobacco Use  . Smoking status: Never Smoker  . Smokeless tobacco: Never Used  Substance and Sexual Activity  . Alcohol use: No  . Drug use: No  . Sexual activity: Not on file  Other Topics Concern  . 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  All other systems are negative.  Review of Systems  Vital Signs: BP (!) 145/76   Pulse 74   Temp 98.2 F (36.8 C) (Oral)   Resp 17   Ht 5\' 7"  (1.702 m)   Wt 174 lb (78.9 kg)   SpO2 97%   BMI 27.25 kg/m   Physical Exam  Constitutional: She is oriented to person, place, and time. She appears well-developed and well-nourished. No distress.  HENT:  Head: Normocephalic and atraumatic.  Eyes: No scleral icterus.  Cardiovascular: Normal rate and regular rhythm.  Pulmonary/Chest: Effort normal.  Abdominal: Soft.  Neurological: She is alert and oriented to person, place, and time.  Skin:  Skin is warm and dry.  Psychiatric: She has a normal mood and affect. Her behavior is normal.  Nursing note and vitals reviewed.    Imaging: Ct Chest W Contrast  Result Date: 12/14/2017 CLINICAL DATA:  Restaging of sigmoid colon cancer, chemotherapy completed 6 months ago. Intermittent constipation and diarrhea. Prior lower anterior resection. Right renal mass. EXAM: CT CHEST, ABDOMEN, AND PELVIS WITH CONTRAST TECHNIQUE: Multidetector CT imaging of the chest, abdomen and pelvis was performed following the standard protocol during bolus administration of intravenous contrast. CONTRAST:  141mL ISOVUE-300 IOPAMIDOL (ISOVUE-300) INJECTION 61% COMPARISON:  06/24/2017 FINDINGS: CT CHEST FINDINGS Cardiovascular: Minimal atherosclerotic calcification of the aortic  arch. Mediastinum/Nodes: No thoracic adenopathy. Lungs/Pleura: Biapical pleuroparenchymal scarring, stable. No findings suspicious for active pulmonary malignancy. Clustered tree-in-bud nodularity posteriorly in the left lower lobe on image 72/6, likely a chronic postinflammatory finding. Musculoskeletal: Unremarkable CT ABDOMEN PELVIS FINDINGS Hepatobiliary: Stable 1.4 by 1.1 cm hypodense lesion in the lateral segment left hepatic lobe on image 56/2 compatible with prior RF ablation, no findings of intrahepatic recurrence. Similar lenticular hypodense lesions posteriorly in the right hepatic lobe including a 1.9 by 1.0 cm lesion on image 61/2 (formerly the same by my measurements) and a 1.8 by 1.0 cm posterior lesion on image 61/2 (formerly the same by my measurements). No new liver lesions. Modified caudate to right hepatic lobe ratio is abnormally elevated suggesting cirrhosis. Pancreas: Unremarkable Spleen: Hypodense lesion in the splenic parenchyma near the splenic hilum 0.9 cm in short axis, stable. Upper normal splenic size. Adrenals/Urinary Tract: Fullness of the left adrenal gland is chronically stable. Solid appearing right kidney upper pole  mass anteriorly measures 2.6 by 2.4 cm on image 58/2. Not changed from 06/24/2017. Stomach/Bowel: Anastomotic staple line in the rectum. Stable adjacent presacral scarring. Air fluid levels in the descending colon which may indicate diarrheal process. 0.8 by 1.0 cm nodular density along the left paracolic gutter on image 01/7 could be a lymph node, diverticulum, or soft tissue tumor deposit. Vascular/Lymphatic: No tumor thrombus in the right renal vein. Porta hepatis lymph node 2.0 cm in short axis on image 60/2, formerly 1.8 cm. Reproductive: Uterus absent.  Adnexa unremarkable. Other: No supplemental non-categorized findings. Musculoskeletal: Degenerative disc disease at L5-S1 with loss of disc height. IMPRESSION: 1. No change in the appearance of the liver ablation sites to suggest recurrent hepatic tumor. 2. A 0.8 by 1.0 cm lymph node, soft tissue deposit, or conceivably diverticulum adjacent to the descending colon along the paracolic gutter. Diverticulum is considered less likely given the appearance. Although potentially benign this merits surveillance. 3. Stable 2.6 cm solid mass in the right kidney upper pole, likely a renal cell carcinoma. No tumor thrombus in the right renal vein. 4. Early cirrhosis. Enlarged porta hepatis lymph node at 2.0 cm is most likely reactive due to the cirrhosis but technically not entirely specific. 5. Other imaging findings of potential clinical significance: Aortic Atherosclerosis (ICD10-I70.0). Stable chronic splenic hypodense lesion, likely a cyst. Degenerative disc disease at L5-S1. Electronically Signed   By: Van Clines M.D.   On: 12/14/2017 13:27   Ct Abdomen Pelvis W Contrast  Result Date: 12/14/2017 CLINICAL DATA:  Restaging of sigmoid colon cancer, chemotherapy completed 6 months ago. Intermittent constipation and diarrhea. Prior lower anterior resection. Right renal mass. EXAM: CT CHEST, ABDOMEN, AND PELVIS WITH CONTRAST TECHNIQUE: Multidetector CT imaging  of the chest, abdomen and pelvis was performed following the standard protocol during bolus administration of intravenous contrast. CONTRAST:  146mL ISOVUE-300 IOPAMIDOL (ISOVUE-300) INJECTION 61% COMPARISON:  06/24/2017 FINDINGS: CT CHEST FINDINGS Cardiovascular: Minimal atherosclerotic calcification of the aortic arch. Mediastinum/Nodes: No thoracic adenopathy. Lungs/Pleura: Biapical pleuroparenchymal scarring, stable. No findings suspicious for active pulmonary malignancy. Clustered tree-in-bud nodularity posteriorly in the left lower lobe on image 72/6, likely a chronic postinflammatory finding. Musculoskeletal: Unremarkable CT ABDOMEN PELVIS FINDINGS Hepatobiliary: Stable 1.4 by 1.1 cm hypodense lesion in the lateral segment left hepatic lobe on image 56/2 compatible with prior RF ablation, no findings of intrahepatic recurrence. Similar lenticular hypodense lesions posteriorly in the right hepatic lobe including a 1.9 by 1.0 cm lesion on image 61/2 (formerly the same by my measurements) and  a 1.8 by 1.0 cm posterior lesion on image 61/2 (formerly the same by my measurements). No new liver lesions. Modified caudate to right hepatic lobe ratio is abnormally elevated suggesting cirrhosis. Pancreas: Unremarkable Spleen: Hypodense lesion in the splenic parenchyma near the splenic hilum 0.9 cm in short axis, stable. Upper normal splenic size. Adrenals/Urinary Tract: Fullness of the left adrenal gland is chronically stable. Solid appearing right kidney upper pole mass anteriorly measures 2.6 by 2.4 cm on image 58/2. Not changed from 06/24/2017. Stomach/Bowel: Anastomotic staple line in the rectum. Stable adjacent presacral scarring. Air fluid levels in the descending colon which may indicate diarrheal process. 0.8 by 1.0 cm nodular density along the left paracolic gutter on image 17/5 could be a lymph node, diverticulum, or soft tissue tumor deposit. Vascular/Lymphatic: No tumor thrombus in the right renal vein.  Porta hepatis lymph node 2.0 cm in short axis on image 60/2, formerly 1.8 cm. Reproductive: Uterus absent.  Adnexa unremarkable. Other: No supplemental non-categorized findings. Musculoskeletal: Degenerative disc disease at L5-S1 with loss of disc height. IMPRESSION: 1. No change in the appearance of the liver ablation sites to suggest recurrent hepatic tumor. 2. A 0.8 by 1.0 cm lymph node, soft tissue deposit, or conceivably diverticulum adjacent to the descending colon along the paracolic gutter. Diverticulum is considered less likely given the appearance. Although potentially benign this merits surveillance. 3. Stable 2.6 cm solid mass in the right kidney upper pole, likely a renal cell carcinoma. No tumor thrombus in the right renal vein. 4. Early cirrhosis. Enlarged porta hepatis lymph node at 2.0 cm is most likely reactive due to the cirrhosis but technically not entirely specific. 5. Other imaging findings of potential clinical significance: Aortic Atherosclerosis (ICD10-I70.0). Stable chronic splenic hypodense lesion, likely a cyst. Degenerative disc disease at L5-S1. Electronically Signed   By: Van Clines M.D.   On: 12/14/2017 13:27    Labs:  CBC: Recent Labs    05/27/17 1408 06/25/17 1351 09/25/17 0851 11/11/17 1403  WBC 2.3* 3.4* 2.9* 3.1*  HGB 10.0* 10.1* 10.9* 10.8*  HCT 31.0* 32.3* 35.2 34.4*  PLT 265 244 240 193    COAGS: No results for input(s): INR, APTT in the last 8760 hours.  BMP: Recent Labs    01/10/17 0349 01/11/17 0419 01/12/17 0438  04/22/17 0945 05/05/17 0941 05/27/17 1408 12/14/17 0859  NA 137 133* 133*   < > 139 139 138 140  K 4.0 4.5 5.0   < > 5.0 4.1 3.9 3.5  CL 106 104 102  --   --   --   --  105  CO2 22 23 26    < > 25 25 23 26   GLUCOSE 160* 138* 117*   < > 88 104 93 100  BUN 17 13 14    < > 13.9 14.7 13.8 12  CALCIUM 8.5* 8.5* 8.8*   < > 9.8 9.6 9.5 9.4  CREATININE 1.36* 0.89 0.89   < > 1.1 1.0 1.1 1.06  GFRNONAA 38* >60 >60  --   --   --    --  50*  GFRAA 44* >60 >60  --   --   --   --  58*   < > = values in this interval not displayed.    LIVER FUNCTION TESTS: Recent Labs    04/22/17 0945 05/05/17 0941 05/27/17 1408 12/14/17 0859  BILITOT 0.42 0.53 0.53 0.4  AST 25 29 20 25   ALT 9 12 8 14   ALKPHOS 78 85  78 88  PROT 7.6 7.6 7.3 7.7  ALBUMIN 4.1 4.0 4.0 3.9    TUMOR MARKERS: No results for input(s): AFPTM, CEA, CA199, CHROMGRNA in the last 8760 hours.  Assessment and Plan:  Mrs. Sheu is doing remarkably well given her fairly extensive treatments for her metastatic colon cancer.  By CT imaging, her ablation sites and hepatic segments 2 and 6 remain stable. No evidence of local recurrence or new hepatic metastatic disease. I would still prefer to follow this up with an MRI given its greater sensitivity.  She does have a new 1 cm soft tissue nodule which appears to be within the retroperitoneal fascia or fat posterior to the descending colon. This is somewhat concerning for a possible new site of metastatic disease. She is aware of this site as is Dr. Benay Spice.  She will get a repeat CEA and imaging in 3 months when she follows up with Dr. Benay Spice.  Her right renal mass remains highly concerning for a low-grade renal malignancy but has remained completely stable over the past 2.5 years. Regarding this lesion, we really have 3 options: We could continue to observe the lesion, particularly given that she may have a new site of metastatic colon cancer, we could proceed with a CT guided biopsy to help determine if this is truly a low-grade malignancy or a benign but rare lipid poor adenoma, and finally we could proceed with percutaneous ablation given the relatively low risk of the procedure.  I explained the risks, benefits and alternatives of the above 3 strategies. Given all of the surgeries and procedures she has artery been through and the stability of this lesion over the past 2.5 years she is not really interested in  pursuing biopsy or ablation at this time. She would like to continue to observe this lesion. If there is any evidence of growth, I think that would prompt [place] to pursue biopsy or ablation.  1.) Return visit in approximately 3 months after her April visit with Dr. Benay Spice. I would like her to have an abdominal MRI with Eovist contrast prior to her visit with me to evaluate both her previously ablated hepatic lesions as well as the right renal mass.   Electronically Signed: Jacqulynn Cadet 12/24/2017, 9:36 AM   I spent a total of 15 Minutes in face to face in clinical consultation, greater than 50% of which was counseling/coordinating care for colon cancer mets to liver and right renal neoplasm.

## 2018-01-07 ENCOUNTER — Other Ambulatory Visit: Payer: Medicare Other

## 2018-01-07 ENCOUNTER — Telehealth: Payer: Self-pay | Admitting: Emergency Medicine

## 2018-01-07 MED ORDER — LEVOTHYROXINE SODIUM 200 MCG PO TABS: 200.0000 ug | ORAL_TABLET | Freq: Every day | ORAL | 1 refills | Status: DC

## 2018-01-07 NOTE — Telephone Encounter (Signed)
Decrease dose to 200 mcg daily and repeat tsh in 6-8 weeks from now.

## 2018-01-07 NOTE — Telephone Encounter (Signed)
[facility] informing pt of change, RX sent to [facility]

## 2018-01-07 NOTE — Telephone Encounter (Signed)
Pt came to lab today to have TSH recheck. On 11/24/17 pt was told to come back in 2 months after med change for recheck. Pt saw oncology on 12/14/17 and had blood work done. Her TSh showed over active. Please advise on medication as [place]ell as when she needs to get her labs rechecked. Pt is out of both 200 mcg and 25 mcg

## 2018-01-13 ENCOUNTER — Encounter: Payer: Self-pay | Admitting: Internal Medicine

## 2018-01-13 ENCOUNTER — Ambulatory Visit (INDEPENDENT_AMBULATORY_CARE_PROVIDER_SITE_OTHER): Payer: Medicare Other | Admitting: Internal Medicine

## 2018-01-13 VITALS — BP 126/78 | HR 59 | Temp 97.5°F | Resp 16 | Wt 167.0 lb

## 2018-01-13 DIAGNOSIS — J029 Acute pharyngitis, unspecified: Secondary | ICD-10-CM | POA: Diagnosis not present

## 2018-01-13 LAB — POCT RAPID STREP A (OFFICE): RAPID STREP A SCREEN: NEGATIVE

## 2018-01-13 NOTE — Patient Instructions (Signed)
Your rapid strep test is negative.    Continue advil, chloraseptic for yoru sore throat.    Call if no improvement     Pharyngitis Pharyngitis is redness, pain, and swelling (inflammation) of the throat (pharynx). It is a very common cause of sore throat. Pharyngitis can be caused by a bacteria, but it is usually caused by a virus. Most cases of pharyngitis get better on their own without treatment. What are the causes? This condition may be caused by:  Infection by viruses (viral). Viral pharyngitis spreads from person to person (is contagious) through coughing, sneezing, and sharing of personal items or utensils such as cups, forks, spoons, and toothbrushes.  Infection by bacteria (bacterial). Bacterial pharyngitis may be spread by touching the nose or face after coming in contact with the bacteria, or through more intimate contact, such as kissing.  Allergies. Allergies can cause buildup of mucus in the throat (post-nasal drip), leading to inflammation and irritation. Allergies can also cause blocked nasal passages, forcing breathing through the mouth, which dries and irritates the throat.  What increases the risk? You are more likely to develop this condition if:  You are 60-62 years old.  You are exposed to crowded environments such as daycare, school, or dormitory living.  You live in a cold climate.  You have a weakened disease-fighting (immune) system.  What are the signs or symptoms? Symptoms of this condition vary by the cause (viral, bacterial, or allergies) and can include:  Sore throat.  Fatigue.  Low-grade fever.  Headache.  Joint pain and muscle aches.  Skin rashes.  Swollen glands in the throat (lymph nodes).  Plaque-like film on the throat or tonsils. This is often a symptom of bacterial pharyngitis.  Vomiting.  Stuffy nose (nasal congestion).  Cough.  Red, itchy eyes (conjunctivitis).  Loss of appetite.  How is this diagnosed? This  condition is often diagnosed based on your medical history and a physical exam. Your health care provider will ask you questions about your illness and your symptoms. A swab of your throat may be done to check for bacteria (rapid strep test). Other lab tests may also be done, depending on the suspected cause, but these are rare. How is this treated? This condition usually gets better in 3-4 days without medicine. Bacterial pharyngitis may be treated with antibiotic medicines. Follow these instructions at home:  Take over-the-counter and prescription medicines only as told by your health care provider. ? If you were prescribed an antibiotic medicine, take it as told by your health care provider. Do not stop taking the antibiotic even if you start to feel better. ? Do not give children aspirin because of the association with Reye syndrome.  Drink enough water and fluids to keep your urine clear or pale yellow.  Get a lot of rest.  Gargle with a salt-water mixture 3-4 times a day or as needed. To make a salt-water mixture, completely dissolve -1 tsp of salt in 1 cup of warm water.  If your health care provider approves, you may use throat lozenges or sprays to soothe your throat. Contact a health care provider if:  You have large, tender lumps in your neck.  You have a rash.  You cough up green, yellow-brown, or bloody spit. Get help right away if:  Your neck becomes stiff.  You drool or are unable to swallow liquids.  You cannot drink or take medicines without vomiting.  You have severe pain that does not go away, even after  you take medicine.  You have trouble breathing, and it is not caused by a stuffy nose.  You have new pain and swelling in your joints such as the knees, ankles, wrists, or elbows. Summary  Pharyngitis is redness, pain, and swelling (inflammation) of the throat (pharynx).  While pharyngitis can be caused by a bacteria, the most common causes are  viral.  Most cases of pharyngitis get better on their own without treatment.  Bacterial pharyngitis is treated with antibiotic medicines. This information is not intended to replace advice given to you by your health care provider. Make sure you discuss any questions you have with your health care provider. Document Released: 11/03/2005 Document Revised: 12/09/2016 Document Reviewed: 12/09/2016 Elsevier Interactive Patient Education  [employer].

## 2018-01-13 NOTE — Progress Notes (Signed)
Subjective:    Patient ID: Nichole Cross, female    DOB: 09/13/1941, 77 y.o.   MRN: [id number]  HPI She is here for an acute visit for cold symptoms.  Her symptoms started 5 days ago.   She is experiencing bilateral ear pain, rhinorrhea, sore throat and a dry cough.  She has had some chills and decreased appetite.    She denies fever, nasal congestion, sinus pain/pressure, sob, wheeze, GI symptoms, body aches and headaches.  She has taken halls cough drops, ibuprofen, and chloraseptic spray.    She has been around sick kids.    Medications and allergies reviewed with patient and updated if appropriate.  Patient Active Problem List   Diagnosis Date Noted  . Local recurrence of rectal cancer  ([place]) 01/09/2017  . Adenocarcinoma of colon metastatic to liver ([place]) 07/25/2016  . Osteopenia, h/o OP 06/07/2016  . Anemia, iron deficiency 06/07/2016  . Sigmoid colon cancer s/p lap assisted sigmoid colectomy 08/09/15 08/09/2015  . Overweight (BMI 25.0-29.9) 09/10/2012  . S/P left UKR 09/09/2012  . ELEVATED BLOOD PRESSURE WITHOUT DIAGNOSIS OF HYPERTENSION 09/30/2010  . GANGLION OF TENDON SHEATH 12/20/2007  . FATTY LIVER DISEASE 08/13/2007  . Hypothyroidism 08/02/2007  . CYSTITIS 08/02/2007  . Osteoarthritis 08/02/2007    Current Outpatient Medications on File Prior to Visit  Medication Sig Dispense Refill  . acetaminophen (TYLENOL) 500 MG tablet Take 500 mg by mouth.    . diazepam (VALIUM) 2 MG tablet Take 1 tablet (2 mg total) by mouth at bedtime as needed (for sleep). 20 tablet 0  . diphenhydrAMINE (BENADRYL) 50 MG capsule Take 50 mg by mouth at bedtime as needed.    . ferrous sulfate 325 (65 FE) MG EC tablet Take 325 mg by mouth 3 (three) times daily with meals.    Marland Kitchen levothyroxine (SYNTHROID) 200 MCG tablet Take 1 tablet (200 mcg total) by mouth daily before breakfast. 30 tablet 1  . loperamide (IMODIUM) 2 MG capsule Take 2 mg by mouth as needed for diarrhea or loose stools.      . polyethylene glycol (MIRALAX / GLYCOLAX) packet Take 17 g by mouth daily as needed for mild constipation. Reported on 06/05/2016    . traMADol (ULTRAM) 50 MG tablet Take 1 tablet (50 mg total) every 6 (six) hours as needed by mouth. 60 tablet 0   No current facility-administered medications on file prior to visit.     Past Medical History:  Diagnosis Date  . Anemia   . Arthritis    knees  . Blood dyscrasia 2016   microcytic anemia, likely iron deficiency anemia secondary to colon cancer  . Cataracts, bilateral   . Colon cancer ([place]) 06/2015   tubular villous adenoma on sigmoid colon specimen (08/09/15), myltiple tubular adenomas removed on a colonoscopy  11/16/15  . Complication of anesthesia   . Headache(784.0)    migraines  . Hypothyroidism   . Liver metastasis ([place])   . Metastasis to kidney ([place])   . PONV (postoperative nausea and vomiting)    NAUSEA  . Renal carcinoma ([place]) 06/2016   ? primary  . Secondary liver cancer ([place]) 06/2016   ? metastasis from colon    Past Surgical History:  Procedure Laterality Date  . ABDOMINAL HYSTERECTOMY  1993   with bilateral BSO  . abdominal tumor  1993  . APPENDECTOMY    . IR GENERIC HISTORICAL  07/01/2016   IR RADIOLOGIST EVAL & MGMT 07/01/2016 Greggory Keen, MD [facility] INTERV  RAD  . IR GENERIC HISTORICAL  08/14/2016   IR RADIOLOGIST EVAL & MGMT 08/14/2016 Jacqulynn Cadet, MD [facility] INTERV RAD  . IR RADIOLOGIST EVAL & MGMT  12/24/2017  . LAPAROSCOPIC PARTIAL COLECTOMY N/A 08/09/2015   Procedure: LAPAROSCOPIC PARTIAL COLECTOMY;  Surgeon: Jackolyn Confer, MD;  Location: [facility];  Service: General;  Laterality: N/A;  . PARTIAL KNEE ARTHROPLASTY  09/09/2012   Procedure: UNICOMPARTMENTAL KNEE;  Surgeon: Mauri Pole, MD;  Location: [facility];  Service: Orthopedics;  Laterality: Left;  . XI ROBOTIC ASSISTED LOWER ANTERIOR RESECTION N/A 01/09/2017   Procedure: XI ROBOTIC ASSISTED LOWER ANTERIOR RESECTION;  Surgeon: Leighton Ruff, MD;  Location:  [facility];  Service: General;  Laterality: N/A;    Social History   Socioeconomic History  . Marital status: Divorced    Spouse name: None  . Number of children: None  . Years of education: None  . Highest education level: None  Social Needs  . Financial resource strain: None  . Food insecurity - worry: None  . Food insecurity - inability: None  . Transportation needs - medical: None  . Transportation needs - non-medical: None  Occupational History  . None  Tobacco Use  . Smoking status: Never Smoker  . Smokeless tobacco: Never Used  Substance and Sexual Activity  . Alcohol use: No  . Drug use: No  . Sexual activity: None  Other Topics Concern  . 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 Pancreatic cancer Mother   . Parkinson's disease Mother   . Breast cancer Sister   . Parkinson's disease Brother   . Parkinson's disease Brother   . Colon cancer Neg Hx     Review of Systems  Constitutional: Positive for appetite change (dec) and chills. Negative for fever.  HENT: Positive for ear pain, rhinorrhea and sore throat. Negative for congestion, hearing loss, sinus pressure and sinus pain.   Respiratory: Positive for cough (dry). Negative for shortness of breath and wheezing.   Gastrointestinal: Negative for abdominal pain, diarrhea and nausea.  Musculoskeletal: Negative for myalgias.  Neurological: Negative for dizziness, light-headedness and headaches.       Objective:   Vitals:   01/13/18 1041  BP: 126/78  Pulse: (!) 59  Resp: 16  Temp: (!) 97.5 F (36.4 C)  SpO2: 97%   Filed Weights   01/13/18 1041  Weight: 167 lb (75.8 kg)   Body mass index is 26.16 kg/m.  Wt Readings from Last 3 Encounters:   01/13/18 167 lb (75.8 kg)  12/24/17 174 lb (78.9 kg)  12/15/17 174 lb 11.2 oz (79.2 kg)     Physical Exam GENERAL APPEARANCE: Appears stated age, well appearing, NAD EYES: conjunctiva clear, no icterus HEENT: bilateral tympanic membranes and ear canals normal, oropharynx with mild erythema, no thyromegaly, trachea midline, no cervical or supraclavicular lymphadenopathy LUNGS: Clear to auscultation without wheeze or crackles, unlabored breathing, good air entry bilaterally CARDIOVASCULAR: Normal S1,S2 without murmurs, no edema SKIN: warm, dry       Assessment & Plan:   See Problem List for Assessment and Plan of chronic medical problems.

## 2018-01-13 NOTE — Assessment & Plan Note (Addendum)
Rapid strep negative Likely viral in nature Continue current otc medications Rest, fluids Call if no improvement

## 2018-02-23 ENCOUNTER — Other Ambulatory Visit: Payer: Self-pay | Admitting: Emergency Medicine

## 2018-02-23 DIAGNOSIS — E038 Other specified hypothyroidism: Secondary | ICD-10-CM

## 2018-02-24 ENCOUNTER — Other Ambulatory Visit (INDEPENDENT_AMBULATORY_CARE_PROVIDER_SITE_OTHER): Payer: Medicare Other

## 2018-02-24 DIAGNOSIS — E038 Other specified hypothyroidism: Secondary | ICD-10-CM

## 2018-02-24 LAB — TSH: TSH: 0.19 u[IU]/mL — ABNORMAL LOW (ref 0.35–4.50)

## 2018-02-25 ENCOUNTER — Other Ambulatory Visit: Payer: Self-pay | Admitting: Internal Medicine

## 2018-02-25 DIAGNOSIS — E038 Other specified hypothyroidism: Secondary | ICD-10-CM

## 2018-02-25 MED ORDER — LEVOTHYROXINE SODIUM 150 MCG PO TABS: 150.0000 ug | ORAL_TABLET | Freq: Every day | ORAL | 3 refills | Status: DC

## 2018-03-16 ENCOUNTER — Encounter: Payer: Self-pay | Admitting: Nurse Practitioner

## 2018-03-16 ENCOUNTER — Inpatient Hospital Stay: Payer: Medicare Other | Attending: Oncology | Admitting: Nurse Practitioner

## 2018-03-16 ENCOUNTER — Inpatient Hospital Stay: Payer: Medicare Other

## 2018-03-16 ENCOUNTER — Telehealth: Payer: Self-pay | Admitting: Nurse Practitioner

## 2018-03-16 VITALS — BP 134/70 | HR 64 | Temp 97.7°F | Resp 18 | Ht 67.0 in | Wt 165.3 lb

## 2018-03-16 DIAGNOSIS — C787 Secondary malignant neoplasm of liver and intrahepatic bile duct: Secondary | ICD-10-CM

## 2018-03-16 DIAGNOSIS — D509 Iron deficiency anemia, unspecified: Secondary | ICD-10-CM | POA: Insufficient documentation

## 2018-03-16 DIAGNOSIS — K5900 Constipation, unspecified: Secondary | ICD-10-CM | POA: Diagnosis not present

## 2018-03-16 DIAGNOSIS — Z85038 Personal history of other malignant neoplasm of large intestine: Secondary | ICD-10-CM | POA: Insufficient documentation

## 2018-03-16 DIAGNOSIS — C189 Malignant neoplasm of colon, unspecified: Secondary | ICD-10-CM

## 2018-03-16 LAB — CBC WITH DIFFERENTIAL (CANCER CENTER ONLY)
BASOS PCT: 1 %
Basophils Absolute: 0 10*3/uL (ref 0.0–0.1)
EOS ABS: 0.1 10*3/uL (ref 0.0–0.5)
Eosinophils Relative: 2 %
HEMATOCRIT: 38.8 % (ref 34.8–46.6)
HEMOGLOBIN: 12.5 g/dL (ref 11.6–15.9)
LYMPHS ABS: 0.9 10*3/uL (ref 0.9–3.3)
Lymphocytes Relative: 26 %
MCH: 24 pg — ABNORMAL LOW (ref 25.1–34.0)
MCHC: 32.2 g/dL (ref 31.5–36.0)
MCV: 74.4 fL — ABNORMAL LOW (ref 79.5–101.0)
Monocytes Absolute: 0.3 10*3/uL (ref 0.1–0.9)
Monocytes Relative: 9 %
NEUTROS PCT: 62 %
Neutro Abs: 2.1 10*3/uL (ref 1.5–6.5)
Platelet Count: 207 10*3/uL (ref 145–400)
RBC: 5.22 MIL/uL (ref 3.70–5.45)
RDW: 19.2 % — ABNORMAL HIGH (ref 11.2–14.5)
WBC: 3.3 10*3/uL — AB (ref 3.9–10.3)

## 2018-03-16 LAB — CEA (IN HOUSE-CHCC): CEA (CHCC-IN HOUSE): 3.6 ng/mL (ref 0.00–5.00)

## 2018-03-16 LAB — FERRITIN: Ferritin: 26 ng/mL (ref 9–269)

## 2018-03-16 MED ORDER — TRAMADOL HCL 50 MG PO TABS: 50.0000 mg | ORAL_TABLET | Freq: Four times a day (QID) | ORAL | 0 refills | Status: DC | PRN

## 2018-03-16 NOTE — Progress Notes (Addendum)
Nichole Cross   Diagnosis: Colon cancer  INTERVAL HISTORY:   Nichole Cross returns as scheduled.  She overall is feeling well.  Appetite is stable.  No abdominal pain.  For the past 2 to 3 weeks she has noted intermittent bright red blood with bowel movements.  This occurs when she is constipated. She continues to have right knee pain.  She takes tramadol as needed.  Objective:  Vital signs in last 24 hours:  Blood pressure 134/70, pulse 64, temperature 97.7 F (36.5 C), temperature source Oral, resp. rate 18, height _0  (1.702 m), weight 165 lb 4.8 oz (75 kg), SpO2 99 %.    HEENT: Neck without mass. Lymphatics: No palpable cervical, supraclavicular or axillary lymph nodes. Resp: Lungs clear bilaterally. Cardio: Regular rate and rhythm. GI: Abdomen soft and nontender.  No hepatomegaly. Vascular: No leg edema.  Calves soft and nontender.    Lab Results:  Lab Results  Component Value Date   WBC 3.3 (L) 03/16/2018   HGB 12.5 03/16/2018   HCT 38.8 03/16/2018   MCV 74.4 (L) 03/16/2018   PLT 207 03/16/2018   NEUTROABS 2.1 03/16/2018    Imaging:  No results found.  Medications: I have reviewed the patient's current medications.  Assessment/Plan: 1. Adenocarcinoma the distal sigmoid colon, poorly differentiated, stage II (T3 N0), status post a laparoscopic assisted sigmoid colectomy 08/09/2015  No loss of mismatch repair protein expression  Elevated preoperative CEA  Staging CT of the abdomen and pelvis 06/22/2015 with no evidence of distant metastatic disease  Mildly elevated CEA 02/23/2017And 04/17/2016  CTs chest, abdomen, and pelvis on 06/04/2016, 70m left upper lobe nodule-no comparison available, new hypodense lesion in the left liver, solid Right renal mass  MRI 06/10/2016-2 enhancing lesions in the liver consistent with metastatic disease, segment 2 and segment 5. Solid enhancing right renal lesion consistent with a renal  cell carcinoma  Radiofrequency ablation of 2 liver lesions on 07/25/2016  Cycle 1 adjuvant Xeloda 08/18/2016  Cycle 2 Xeloda 09/08/2016  CEA improved 09/26/2016  Cycle 3 Xeloda held 09/29/2016 due to unexplained abdominal pain,referred for CT  CT abdomen/pelvis 09/30/2016 with a long segment of bowel wall thickening involving the distal ileum; lesion left hepatic lobe similar to slightly smaller following ablation; lesion posterior right hepatic lobe elongated favored to represent treatment tract.  Cycle 3 Xeloda 10/10/2016 (dose reduced due to drug induced enteritis following cycle 2)discontinued 10/15/2016 secondary to abdominal pain.  CT in while the emergency room with abdominal pain 10/15/2016 revealed descending colitis  Colonoscopy 10/30/2016 confirmed a mass at the: colo-colonicanastomosis  Cycle 4 Xeloda 11/03/2016  01/09/2017 status post a low anterior resection for removal of locally recurrent tumor at the sigmoid anastomosis, resection margins negative, tumor invades pericolonic soft tissue, 12 benign lymph nodes;MSI stabl  Cycle 5 Xeloda 02/12/2017  Cycle 6 Xeloda 03/12/2017, Xeloda dose reduced to 1000 mg twice daily on 03/18/2017  Cycle 7 Xeloda 04/09/2017  Cycle 8 Xeloda 05/12/2017  Restaging CTs 06/24/2017-no evidence of metastatic disease involving the chest. Post ablation changes in the liver. No new hepatic metastatic disease.  Restaging CTs 12/14/2017- stable liver ablation sites, indeterminate nodule adjacent to the descending colon, stable left renal mass, changes of early cirrhosis, enlarged porta hepatis node-likely reactive   2.History ofMicrocytic anemia-likely iron deficiency anemia secondary to colon cancer, persistent microcytic anemia; ferrous sulfate increased to twice daily 12/15/2017; hemoglobin in normal range 03/16/2018, persistent microcytosis  3.Tubular villous adenoma on the sigmoid colon resection  specimen 08/09/2015  Multiple  tubular adenomas removed on a colonoscopy 11/16/2015  4. Right renal mass on CT 06/04/2016-suspicious for renal cell carcinoma;stable on CT 06/24/2017;referred to urology  5. History ofneutropenia secondary to chemotherapy     Disposition: Nichole Cross appears stable.  There is no clinical evidence of disease progression.  We will follow-up on the CEA from today.  Plan for restaging CTs in 3 months which will be at a six-month interval from the previous.  The hemoglobin has corrected into normal range.  Persistent red cell microcytosis.  She will continue oral iron.  She notes rectal bleeding associated with constipation.  She will begin a stool softener/laxative.  She will contact Dr. Hilarie Fredrickson if the bleeding persist.  She will return for follow-up in 3 months.  She will contact the office in the interim with any problems.  Patient seen with Dr. Benay Spice.    [name] ANP/GNP-BC   03/16/2018  10:11 AM This was a shared visit with [name].  Nichole Cross remains in clinical remission from colon cancer.  She will return for an office visit and restaging CTs in 3 months.  Julieanne Manson, MD

## 2018-03-16 NOTE — Telephone Encounter (Signed)
Scheduled appt per 4/30 los - Gave patient aVS and calender per los. Central radiology to contact patient with ct scan .

## 2018-03-17 ENCOUNTER — Telehealth: Payer: Self-pay | Admitting: *Deleted

## 2018-03-17 NOTE — Telephone Encounter (Signed)
-----   Message from Owens Shark, NP sent at 03/16/2018  4:35 PM EDT ----- Please let her know the CEA is normal.  Follow-up as scheduled.

## 2018-03-17 NOTE — Telephone Encounter (Signed)
Notified pt of normal CEA. She voiced appreciation for call.

## 2018-03-31 ENCOUNTER — Other Ambulatory Visit (HOSPITAL_COMMUNITY): Payer: Self-pay | Admitting: Interventional Radiology

## 2018-03-31 ENCOUNTER — Encounter: Payer: Self-pay | Admitting: Radiology

## 2018-03-31 ENCOUNTER — Other Ambulatory Visit: Payer: Self-pay | Admitting: Radiology

## 2018-03-31 DIAGNOSIS — C189 Malignant neoplasm of colon, unspecified: Secondary | ICD-10-CM

## 2018-03-31 DIAGNOSIS — N2889 Other specified disorders of kidney and ureter: Secondary | ICD-10-CM

## 2018-03-31 DIAGNOSIS — C787 Secondary malignant neoplasm of liver and intrahepatic bile duct: Principal | ICD-10-CM

## 2018-04-15 LAB — COMPLETE METABOLIC PANEL WITH GFR
AG Ratio: 1.6 (calc) (ref 1.0–2.5)
ALKALINE PHOSPHATASE (APISO): 87 U/L (ref 33–130)
ALT: 14 U/L (ref 6–29)
AST: 26 U/L (ref 10–35)
Albumin: 4.4 g/dL (ref 3.6–5.1)
BILIRUBIN TOTAL: 0.4 mg/dL (ref 0.2–1.2)
BUN/Creatinine Ratio: 12 (calc) (ref 6–22)
BUN: 12 mg/dL (ref 7–25)
CHLORIDE: 105 mmol/L (ref 98–110)
CO2: 22 mmol/L (ref 20–32)
CREATININE: 0.99 mg/dL — AB (ref 0.60–0.93)
Calcium: 9.5 mg/dL (ref 8.6–10.4)
GFR, Est African American: 65 mL/min/{1.73_m2} (ref >=60)
GFR, Est Non African American: 56 mL/min/{1.73_m2} — ABNORMAL LOW (ref >=60)
GLUCOSE: 99 mg/dL (ref 65–139)
Globulin: 2.8 g/dL (calc) (ref 1.9–3.7)
Potassium: 4 mmol/L (ref 3.5–5.3)
SODIUM: 137 mmol/L (ref 135–146)
Total Protein: 7.2 g/dL (ref 6.1–8.1)

## 2018-04-15 LAB — PROTIME-INR
INR: 1
PROTHROMBIN TIME: 10.3 s (ref 9.0–11.5)

## 2018-04-22 ENCOUNTER — Encounter: Payer: Self-pay | Admitting: Radiology

## 2018-04-22 ENCOUNTER — Ambulatory Visit (HOSPITAL_COMMUNITY)
Admission: RE | Admit: 2018-04-22 | Discharge: 2018-04-22 | Disposition: A | Discharge status: Home / Self Care | Payer: Medicare Other | Source: Ambulatory Visit | Attending: Interventional Radiology | Admitting: Interventional Radiology

## 2018-04-22 ENCOUNTER — Ambulatory Visit
Admission: RE | Admit: 2018-04-22 | Discharge: 2018-04-22 | Disposition: A | Discharge status: Home / Self Care | Payer: Medicare Other | Source: Ambulatory Visit | Attending: Interventional Radiology | Admitting: Interventional Radiology

## 2018-04-22 DIAGNOSIS — C787 Secondary malignant neoplasm of liver and intrahepatic bile duct: Secondary | ICD-10-CM | POA: Insufficient documentation

## 2018-04-22 DIAGNOSIS — C189 Malignant neoplasm of colon, unspecified: Secondary | ICD-10-CM | POA: Diagnosis not present

## 2018-04-22 DIAGNOSIS — R161 Splenomegaly, not elsewhere classified: Secondary | ICD-10-CM | POA: Insufficient documentation

## 2018-04-22 DIAGNOSIS — K669 Disorder of peritoneum, unspecified: Secondary | ICD-10-CM | POA: Diagnosis not present

## 2018-04-22 DIAGNOSIS — K760 Fatty (change of) liver, not elsewhere classified: Secondary | ICD-10-CM | POA: Insufficient documentation

## 2018-04-22 DIAGNOSIS — N2889 Other specified disorders of kidney and ureter: Secondary | ICD-10-CM

## 2018-04-22 HISTORY — PX: IR RADIOLOGIST EVAL & MGMT: IMG5224

## 2018-04-22 MED ORDER — GADOXETATE DISODIUM 0.25 MMOL/ML IV SOLN
10.0000 mL | Freq: Once | INTRAVENOUS | Status: AC | PRN
  Administered 2018-04-22: 7 mL via INTRAVENOUS

## 2018-04-22 NOTE — Progress Notes (Signed)
Chief Complaint: Patient was seen in follow up today for  Chief Complaint  Patient presents with  . Follow-up    1 yr 8 mo follow up [place] [facility] Liver   at the request of [place]  Referring Physician(s): Dr. Benay Spice   History of Present Illness: Nichole Cross is a 78 y.o. female  with a history of metastatic colon cancer ( 2 hepatic lesions) as well as a putative right sided RCC. She underwent microwave ablation of her segment 2 and 6 liver lesions on 07-25-16. On surveillance, she was found to have local recurrence in her pelvis and underwent additional chemotherapy and repeat surgery in February of 2018.   Thus far, her likely right sided renal cell carcinoma has remained indolent and has demonstrated no interval change in size.  Her prior ablation sites in the liver have also remained stable without evidence of recurrent hepatic metastatic disease.  On her most recent imaging from January 2019, there was a possible nodule in the left paracolic gutter adjacent to the descending colon concerning for peritoneal metastatic disease from her colorectal cancer.  She presents today for scheduled follow-up and repeat MRI to evaluate her hepatic ablation sites and the right renal mass.    Clinically, she is doing extremely well.  She continues to work and denies all systemic symptoms or complaints.  She is not losing weight, her appetite is good.  She has no abdominal pain, nausea or vomiting.  She does report some intermittent constipation which is often followed by rectal bleeding.  She has discussed this with her colorectal surgeon Dr. Marcello Moores and plans on following up with her gastroenterologist, Dr. Hilarie Fredrickson.    Past Medical History:  Diagnosis Date  . Anemia   . Arthritis    knees  . Blood dyscrasia 2016   microcytic anemia, likely iron deficiency anemia secondary to colon cancer  . Cataracts, bilateral   . Colon cancer ([place]) 06/2015   tubular villous adenoma on sigmoid  colon specimen (08/09/15), myltiple tubular adenomas removed on a colonoscopy  11/16/15  . Complication of anesthesia   . Headache(784.0)    migraines  . Hypothyroidism   . Liver metastasis ([place])   . Metastasis to kidney ([place])   . PONV (postoperative nausea and vomiting)    NAUSEA  . Renal carcinoma ([place]) 06/2016   ? primary  . Secondary liver cancer ([place]) 06/2016   ? metastasis from colon    Past Surgical History:  Procedure Laterality Date  . ABDOMINAL HYSTERECTOMY  1993   with bilateral BSO  . abdominal tumor  1993  . APPENDECTOMY    . IR GENERIC HISTORICAL  07/01/2016   IR RADIOLOGIST EVAL & MGMT 07/01/2016 Greggory Keen, MD [facility] INTERV RAD  . IR GENERIC HISTORICAL  08/14/2016   IR RADIOLOGIST EVAL & MGMT 08/14/2016 Jacqulynn Cadet, MD [facility] INTERV RAD  . IR RADIOLOGIST EVAL & MGMT  12/24/2017  . IR RADIOLOGIST EVAL & MGMT  04/22/2018  . LAPAROSCOPIC PARTIAL COLECTOMY N/A 08/09/2015   Procedure: LAPAROSCOPIC PARTIAL COLECTOMY;  Surgeon: Jackolyn Confer, MD;  Location: [facility];  Service: General;  Laterality: N/A;  . PARTIAL KNEE ARTHROPLASTY  09/09/2012   Procedure: UNICOMPARTMENTAL KNEE;  Surgeon: Mauri Pole, MD;  Location: [facility];  Service: Orthopedics;  Laterality: Left;  . XI ROBOTIC ASSISTED LOWER ANTERIOR RESECTION N/A 01/09/2017   Procedure: XI ROBOTIC ASSISTED LOWER ANTERIOR RESECTION;  Surgeon: Leighton Ruff, MD;  Location: [facility];  Service: General;  Laterality:  N/A;    Allergies: Aspirin and Morphine and related  Medications: Prior to Admission medications   Medication Sig Start Date End Date Taking? Authorizing Provider  acetaminophen (TYLENOL) 500 MG tablet Take 500 mg by mouth.   Yes [provider]  diphenhydrAMINE (BENADRYL) 50 MG capsule Take 50 mg by mouth at bedtime as needed.   Yes [provider]  ferrous sulfate 325 (65 FE) MG EC tablet Take 325 mg by mouth 3 (three) times daily with meals.   Yes [provider]    levothyroxine (SYNTHROID, LEVOTHROID) 150 MCG tablet Take 1 tablet (150 mcg total) by mouth daily. 02/25/18  Yes Burns, Claudina Lick, MD  loperamide (IMODIUM) 2 MG capsule Take 2 mg by mouth as needed for diarrhea or loose stools.   Yes [provider]  polyethylene glycol (MIRALAX / GLYCOLAX) packet Take 17 g by mouth daily as needed for mild constipation. Reported on 06/05/2016   Yes [provider]  traMADol (ULTRAM) 50 MG tablet Take 1 tablet (50 mg total) by mouth every 6 (six) hours as needed. 03/16/18  Yes Owens Shark, NP     Family History  Problem Relation Age of Onset  . Pancreatic cancer Mother   . Parkinson's disease Mother   . Breast cancer Sister   . Parkinson's disease Brother   . Parkinson's disease Brother   . Colon cancer Neg Hx     Social History   Socioeconomic History  . Marital status: Divorced    Spouse name: Not on file  . Number of children: Not on file  . Years of education: Not on file  . Highest education level: Not on file  Occupational History  . Not on file  Social Needs  . Financial resource strain: Not on file  . Food insecurity:    Worry: Not on file    Inability: Not on file  . Transportation needs:    Medical: Not on file    Non-medical: Not on file  Tobacco Use  . Smoking status: Never Smoker  . Smokeless tobacco: Never Used  Substance and Sexual Activity  . Alcohol use: No  . Drug use: No  . Sexual activity: Not on file  Lifestyle  . Physical activity:    Days per week: Not on file    Minutes per session: Not on file  . Stress: Not on file  Relationships  . Social connections:    Talks on phone: Not on file    Gets together: Not on file    Attends religious service: Not on file    Active member of club or organization: Not on file    Attends meetings of clubs or organizations: Not on file    Relationship status: Not on file  Other Topics Concern  . 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  All other systems are negative.  Review of Systems  Vital Signs: BP (!) 146/76   Pulse 62   Temp 98.2 F (36.8 C) (Oral)   Resp 14   Ht 5\' 7"  (1.702 m)  Wt 164 lb (74.4 kg)   SpO2 99%   BMI 25.69 kg/m   Physical Exam  Constitutional: She is oriented to person, place, and time. She appears well-developed and well-nourished. No distress.  HENT:  Head: Normocephalic and atraumatic.  Eyes: No scleral icterus.  Cardiovascular: Normal rate.  Pulmonary/Chest: Effort normal.  Abdominal: Soft. She exhibits no distension. There is no tenderness.  Neurological: She is alert and oriented to person, place, and time.  Skin: Skin is warm and dry.  Psychiatric: She has a normal mood and affect. Her behavior is normal.  Nursing note and vitals reviewed.   Imaging: Mr Abdomen Wwo Contrast  Result Date: 04/22/2018 CLINICAL DATA:  Sigmoid colon cancer diagnosed August 2016 status post partial colectomy 08/09/2015, with oligometastatic disease to the liver status post CT-guided percutaneous thermal ablation 07/25/2016, with repeat surgical resection for local tumor recurrence 01/09/2017, presenting for restaging. EXAM: MRI ABDOMEN WITHOUT AND WITH CONTRAST TECHNIQUE: Multiplanar multisequence MR imaging of the abdomen was performed both before and after the administration of intravenous contrast. CONTRAST:  55mL EOVIST GADOXETATE DISODIUM 0.25 MOL/L IV SOLN COMPARISON:  12/14/2017 CT chest, abdomen and pelvis. 06/10/2016 MRI abdomen. FINDINGS: Lower chest: No acute abnormality at the lung bases. Hepatobiliary: Diffuse hepatic steatosis. Small 1.4 x 1.0 cm ablation  defect in segment 2 left liver lobe (series 502/image 51), previously 1.4 x 1.1 cm on 12/14/2017 CT, unchanged in size, with no convincing enhancement. Linear 2.9 x 0.9 cm ablation defect in the segment 6 right liver lobe (series 502/image 73), previously 3.2 x 1.0 cm on 12/14/2017 CT, unchanged in size, with no convincing enhancement. No new liver lesions. There is a 1.0 x 0.6 cm enhancing extrahepatic nodule near the liver capsule anterior to the segment 2 left liver lobe (series 502/image 44), previously 1.1 x 0.7 cm on 12/14/2017, not appreciably changed in size. No cholelithiasis. Mild focal adenomyomatosis in the fundal gallbladder wall (series 3/image 19). No biliary ductal dilatation. Common bile duct diameter 4 mm. No choledocholithiasis. Pancreas: No pancreatic mass or duct dilation. No evidence of pancreas divisum. Spleen: Mild splenomegaly (craniocaudal splenic length 13.8 cm), stable. Stable nonenhancing 0.9 cm unilocular cystic splenic lesion, compatible with a benign lesion such as a lymphangioma. No new splenic lesions. Adrenals/Urinary Tract: Stable appearance of the adrenal glands with no discrete adrenal nodules. No hydronephrosis. Avidly enhancing 2.9 x 2.5 cm renal cortical mass in the anterior upper right kidney (series 501/image 57), previously 2.8 x 2.5 cm on 06/10/2016 MRI using similar measurement technique, not appreciably changed. Simple subcentimeter renal cysts in the left kidney. Stomach/Bowel: Normal non-distended stomach. Visualized small and large bowel is normal caliber, with no bowel wall thickening. Vascular/Lymphatic: Normal caliber abdominal aorta. Patent portal, splenic, hepatic and renal veins. No pathologically enlarged lymph nodes in the abdomen. Other: No abdominal ascites or focal fluid collection. There is an enhancing 1.7 x 1.6 cm peritoneal mass in the left pericolic gutter (series 51/WCHEN 49), increased from 1.0 x 0.8 cm on 12/14/2017 CT. Musculoskeletal: No  aggressive appearing focal osseous lesions. IMPRESSION: 1. Interval growth of enhancing 1.7 cm peritoneal mass in the left paracolic gutter, compatible with enlarging peritoneal metastasis. 2. Small enhancing extrahepatic 1.0 cm nodule anterior to segment 2 left liver lobe capsule, stable in size since 12/14/2017 CT, indeterminate for fat necrosis versus peritoneal metastasis. 3. No evidence of local tumor recurrence at the ablation sites in the segment 2 and segment 6 liver. No new liver metastases. 4. Continued stability of avidly enhancing  2.9 cm renal cortical mass in the anterior upper right kidney, for which clear cell renal cell carcinoma is the diagnosis of exclusion. 5. Stable mild splenomegaly. 6. Diffuse hepatic steatosis. Electronically Signed   By: Ilona Sorrel M.D.   On: 04/22/2018 12:01   Ir Radiologist Eval & Mgmt  Result Date: 04/22/2018 Please refer to notes tab for details about interventional procedure. (Op Note)   Labs:  CBC: Recent Labs    06/25/17 1351 09/25/17 0851 11/11/17 1403 03/16/18 0951  WBC 3.4* 2.9* 3.1* 3.3*  HGB 10.1* 10.9* 10.8* 12.5  HCT 32.3* 35.2 34.4* 38.8  PLT 244 240 193 207    COAGS: Recent Labs    04/14/18 1420  INR 1.0    BMP: Recent Labs    05/05/17 0941 05/27/17 1408 12/14/17 0859 04/14/18 1420  NA 139 138 140 137  K 4.1 3.9 3.5 4.0  CL  --   --  105 105  CO2 25 23 26 22   GLUCOSE 104 93 100 99  BUN 14.7 13.8 12 12   CALCIUM 9.6 9.5 9.4 9.5  CREATININE 1.0 1.1 1.06 0.99*  GFRNONAA  --   --  50* 56*  GFRAA  --   --  58* 65    LIVER FUNCTION TESTS: Recent Labs    05/05/17 0941 05/27/17 1408 12/14/17 0859 04/14/18 1420  BILITOT 0.53 0.53 0.4 0.4  AST 29 20 25 26   ALT 12 8 14 14   ALKPHOS 85 78 88  --   PROT 7.6 7.3 7.7 7.2  ALBUMIN 4.0 4.0 3.9  --     TUMOR MARKERS: No results for input(s): AFPTM, CEA, CA199, CHROMGRNA in the last 8760 hours.  Assessment and Plan:  Clinically, Mrs. Canny is doing exceptionally  well and remains asymptomatic.  I reviewed her MRI imaging today.  Her 2 prior ablation sites in the liver both look good without evidence of residual or recurrent disease.  No evidence of new metastatic disease within the liver.  Her right renal mass remains completely unchanged over the past 2 years dating back to 2017.  While this is still likely an indolent renal cell carcinoma, the lack of interval growth is reassuring.  We discussed again the possibilities of continued surveillance, biopsy or treatment with percutaneous ablation.  She continues to desire simple surveillance which is very reasonable.  Unfortunately, in the very inferior images of the MRI, I do believe we can see the small possible peritoneal implants noted on the CT scan from January.  This looks to be enlarged which makes me concerned that she has recurrent peritoneal metastatic disease.  She has a CT scan scheduled in the next few weeks followed by a follow-up appointment with Dr. Benay Spice.  I reemphasized to her that she does indeed need to follow-up with Dr. Hilarie Fredrickson regarding her intermittent GI bleeding.  Even though she feels it secondary to constipation and hemorrhoids, given her history is important for her to have this evaluated.  1.)  Return clinic visit in 6 months with repeat MRI of the abdomen with gadolinium contrast to evaluate the liver and right renal lesion.  2.)  I will ask my office staff to make an note for [place] to follow-up on the results of her pending CT scan of the abdomen and pelvis and follow-up appointment with Dr. Benay Spice so we can stay abreast of any additional metastatic disease and treatment regimens that may be forthcoming.   Electronically Signed: Jacqulynn Cadet 04/22/2018, 1:08 PM  I spent a total of  15 Minutes in face to face in clinical consultation, greater than 50% of which was counseling/coordinating care for colon cancer metastatic to liver, right renal mass.

## 2018-04-26 ENCOUNTER — Other Ambulatory Visit (INDEPENDENT_AMBULATORY_CARE_PROVIDER_SITE_OTHER): Payer: Medicare Other

## 2018-04-26 DIAGNOSIS — E038 Other specified hypothyroidism: Secondary | ICD-10-CM

## 2018-04-26 LAB — TSH: TSH: 1.73 u[IU]/mL (ref 0.35–4.50)

## 2018-04-27 ENCOUNTER — Other Ambulatory Visit: Payer: Self-pay | Admitting: Emergency Medicine

## 2018-04-27 DIAGNOSIS — E038 Other specified hypothyroidism: Secondary | ICD-10-CM

## 2018-05-17 ENCOUNTER — Encounter: Payer: Self-pay | Admitting: Physician Assistant

## 2018-05-17 ENCOUNTER — Encounter (HOSPITAL_COMMUNITY): Payer: Self-pay | Admitting: Emergency Medicine

## 2018-05-17 ENCOUNTER — Telehealth: Payer: Self-pay | Admitting: Internal Medicine

## 2018-05-17 ENCOUNTER — Ambulatory Visit (INDEPENDENT_AMBULATORY_CARE_PROVIDER_SITE_OTHER): Payer: Medicare Other | Admitting: Physician Assistant

## 2018-05-17 ENCOUNTER — Telehealth: Payer: Self-pay

## 2018-05-17 ENCOUNTER — Emergency Department (HOSPITAL_COMMUNITY)
Admission: EM | Admit: 2018-05-17 | Discharge: 2018-05-17 | Disposition: A | Discharge status: Home / Self Care | Payer: Medicare Other | Attending: Emergency Medicine | Admitting: Emergency Medicine

## 2018-05-17 VITALS — BP 128/76 | HR 72 | Ht 67.0 in | Wt 164.6 lb

## 2018-05-17 DIAGNOSIS — D015 Carcinoma in situ of liver, gallbladder and bile ducts: Secondary | ICD-10-CM | POA: Insufficient documentation

## 2018-05-17 DIAGNOSIS — K921 Melena: Secondary | ICD-10-CM

## 2018-05-17 DIAGNOSIS — Z85038 Personal history of other malignant neoplasm of large intestine: Secondary | ICD-10-CM | POA: Diagnosis not present

## 2018-05-17 DIAGNOSIS — K922 Gastrointestinal hemorrhage, unspecified: Secondary | ICD-10-CM

## 2018-05-17 DIAGNOSIS — Z96652 Presence of left artificial knee joint: Secondary | ICD-10-CM | POA: Insufficient documentation

## 2018-05-17 DIAGNOSIS — E039 Hypothyroidism, unspecified: Secondary | ICD-10-CM | POA: Insufficient documentation

## 2018-05-17 DIAGNOSIS — K625 Hemorrhage of anus and rectum: Secondary | ICD-10-CM | POA: Insufficient documentation

## 2018-05-17 DIAGNOSIS — Z79899 Other long term (current) drug therapy: Secondary | ICD-10-CM | POA: Diagnosis not present

## 2018-05-17 DIAGNOSIS — C189 Malignant neoplasm of colon, unspecified: Secondary | ICD-10-CM | POA: Diagnosis not present

## 2018-05-17 LAB — COMPREHENSIVE METABOLIC PANEL
ALK PHOS: 84 U/L (ref 38–126)
ALT: 14 U/L (ref 0–44)
ANION GAP: 8 (ref 5–15)
AST: 25 U/L (ref 15–41)
Albumin: 4.3 g/dL (ref 3.5–5.0)
BUN: 14 mg/dL (ref 8–23)
CALCIUM: 9.3 mg/dL (ref 8.9–10.3)
CO2: 25 mmol/L (ref 22–32)
Chloride: 106 mmol/L (ref 98–111)
Creatinine, Ser: 0.88 mg/dL (ref 0.44–1.00)
GFR calc non Af Amer: >60 mL/min (ref >60)
Glucose, Bld: 107 mg/dL — ABNORMAL HIGH (ref 70–99)
Potassium: 4.1 mmol/L (ref 3.5–5.1)
SODIUM: 139 mmol/L (ref 135–145)
Total Bilirubin: 0.8 mg/dL (ref 0.3–1.2)
Total Protein: 7.7 g/dL (ref 6.5–8.1)

## 2018-05-17 LAB — PROTIME-INR
INR: 1.07
PROTHROMBIN TIME: 13.8 s (ref 11.4–15.2)

## 2018-05-17 LAB — CBC
HCT: 39.3 % (ref 36.0–46.0)
HEMOGLOBIN: 12.9 g/dL (ref 12.0–15.0)
MCH: 25.4 pg — AB (ref 26.0–34.0)
MCHC: 32.8 g/dL (ref 30.0–36.0)
MCV: 77.4 fL — ABNORMAL LOW (ref 78.0–100.0)
Platelets: 217 10*3/uL (ref 150–400)
RBC: 5.08 MIL/uL (ref 3.87–5.11)
RDW: 16.9 % — ABNORMAL HIGH (ref 11.5–15.5)
WBC: 3.7 10*3/uL — AB (ref 4.0–10.5)

## 2018-05-17 LAB — TYPE AND SCREEN
ABO/RH(D): O POS
ANTIBODY SCREEN: NEGATIVE

## 2018-05-17 LAB — APTT: aPTT: 27 seconds (ref 24–36)

## 2018-05-17 NOTE — ED Triage Notes (Signed)
Per pt, states rectal bleeding on and off for 4-5 weeks-states she saw her GI MD today and was told to come to ED for eval/admit-states blood is sometimes bright red and other times dark with clots-states left lower pelvic pain-states something was seen on MRI but hasnt been worked up yet-no dizziness, CP or SOB-"just tired"

## 2018-05-17 NOTE — Patient Instructions (Signed)
Please go to the ER ASAP.

## 2018-05-17 NOTE — Telephone Encounter (Signed)
Received call from pt reporting "rectal bleeding". Pt states that she experiences "bleeding with bowel movements and over the weekend the bleeding got heavier. This morning it was very heavy". Per Dr. Benay Spice, pt to consult her gastroenterologist, Dr. Hilarie Fredrickson, regarding the matter, Pt voiced understanding .

## 2018-05-17 NOTE — ED Provider Notes (Signed)
[facility] DEPT Provider Note   CSN: [id number] Arrival date & time: 05/17/18  1326     History   Chief Complaint Chief Complaint  Patient presents with  . Rectal Bleeding    HPI Nichole Cross is a 77 y.o. female.  HPI Patient presents to the emergency room for evaluation of rectal bleeding.  Patient has a history of left hemicolectomy as a result of colon cancer.  Patient had CT-guided percutaneous thermal ablation and repeat surgical resection of local tumor recurrence.  Patient is followed by lumbar GI.  Patient had an outpatient MRI in June 6 that showed interval growth of enhancing peritoneal mass.  Patient states she went to her GI doctor today because she has been having increasing rectal bleeding.  She has had this intermittently over the last few months but over the weekend she had heavier bleeding.  Patient's had multiple episodes per day and at times passing clots.  She had a rectal exam at the office that showed bright red blood.  Patient was sent to the emergency room for probable admission and further evaluation. Past Medical History:  Diagnosis Date  . Anemia   . Arthritis    knees  . Blood dyscrasia 2016   microcytic anemia, likely iron deficiency anemia secondary to colon cancer  . Cataracts, bilateral   . Colon cancer ([place]) 06/2015   tubular villous adenoma on sigmoid colon specimen (08/09/15), myltiple tubular adenomas removed on a colonoscopy  11/16/15  . Complication of anesthesia   . Headache(784.0)    migraines  . Hypothyroidism   . Liver metastasis ([place])   . Metastasis to kidney ([place])   . PONV (postoperative nausea and vomiting)    NAUSEA  . Renal carcinoma ([place]) 06/2016   ? primary  . Secondary liver cancer ([place]) 06/2016   ? metastasis from colon    Patient Active Problem List   Diagnosis Date Noted  . Local recurrence of rectal cancer  ([place]) 01/09/2017  . Adenocarcinoma of colon metastatic to liver ([place])  07/25/2016  . Osteopenia, h/o OP 06/07/2016  . Anemia, iron deficiency 06/07/2016  . Sigmoid colon cancer s/p lap assisted sigmoid colectomy 08/09/15 08/09/2015  . Overweight (BMI 25.0-29.9) 09/10/2012  . S/P left UKR 09/09/2012  . ELEVATED BLOOD PRESSURE WITHOUT DIAGNOSIS OF HYPERTENSION 09/30/2010  . PHARYNGITIS 02/17/2008  . GANGLION OF TENDON SHEATH 12/20/2007  . FATTY LIVER DISEASE 08/13/2007  . Hypothyroidism 08/02/2007  . CYSTITIS 08/02/2007  . Osteoarthritis 08/02/2007    Past Surgical History:  Procedure Laterality Date  . ABDOMINAL HYSTERECTOMY  1993   with bilateral BSO  . abdominal tumor  1993  . APPENDECTOMY    . IR GENERIC HISTORICAL  07/01/2016   IR RADIOLOGIST EVAL & MGMT 07/01/2016 Greggory Keen, MD [facility] INTERV RAD  . IR GENERIC HISTORICAL  08/14/2016   IR RADIOLOGIST EVAL & MGMT 08/14/2016 Jacqulynn Cadet, MD [facility] INTERV RAD  . IR RADIOLOGIST EVAL & MGMT  12/24/2017  . IR RADIOLOGIST EVAL & MGMT  04/22/2018  . LAPAROSCOPIC PARTIAL COLECTOMY N/A 08/09/2015   Procedure: LAPAROSCOPIC PARTIAL COLECTOMY;  Surgeon: Jackolyn Confer, MD;  Location: [facility];  Service: General;  Laterality: N/A;  . PARTIAL KNEE ARTHROPLASTY  09/09/2012   Procedure: UNICOMPARTMENTAL KNEE;  Surgeon: Mauri Pole, MD;  Location: [facility];  Service: Orthopedics;  Laterality: Left;  . XI ROBOTIC ASSISTED LOWER ANTERIOR RESECTION N/A 01/09/2017   Procedure: XI ROBOTIC ASSISTED LOWER ANTERIOR RESECTION;  Surgeon: Leighton Ruff, MD;  Location: [facility];  Service: General;  Laterality: N/A;     OB History   None      Home Medications    Prior to Admission medications   Medication Sig Start Date End Date Taking? Authorizing Provider  ferrous sulfate 325 (65 FE) MG EC tablet Take 325 mg by mouth 3 (three) times daily with meals.   Yes [provider]  levothyroxine (SYNTHROID, LEVOTHROID) 150 MCG tablet Take 1 tablet (150 mcg total) by mouth daily. 02/25/18  Yes Burns, Claudina Lick, MD    loperamide (IMODIUM) 2 MG capsule Take 2 mg by mouth as needed for diarrhea or loose stools.   Yes [provider]  traMADol (ULTRAM) 50 MG tablet Take 1 tablet (50 mg total) by mouth every 6 (six) hours as needed. 03/16/18  Yes Owens Shark, NP  polyethylene glycol (MIRALAX / GLYCOLAX) packet Take 17 g by mouth daily as needed for mild constipation. Reported on 06/05/2016    [provider]    Family History Family History  Problem Relation Age of Onset  . Pancreatic cancer Mother   . Parkinson's disease Mother   . Breast cancer Sister   . Parkinson's disease Brother   . Parkinson's disease Brother   . Colon cancer Neg Hx     Social History Social History   Tobacco Use  . Smoking status: Never Smoker  . Smokeless tobacco: Never Used  Substance Use Topics  . Alcohol use: No  . Drug use: No     Allergies   Aspirin and Morphine and related   Review of Systems Review of Systems  All other systems reviewed and are negative.    Physical Exam Updated Vital Signs BP (!) 145/117 (BP Location: Left Arm)   Pulse 74   Temp 97.7 F (36.5 C) (Oral)   Resp 17   SpO2 98%   Physical Exam  Constitutional: No distress.  HENT:  Head: Normocephalic and atraumatic.  Right Ear: External ear normal.  Left Ear: External ear normal.  Eyes: Conjunctivae are normal. Right eye exhibits no discharge. Left eye exhibits no discharge. No scleral icterus.  Neck: Neck supple. No tracheal deviation present.  Cardiovascular: Normal rate, regular rhythm and intact distal pulses.  Pulmonary/Chest: Effort normal and breath sounds normal. No stridor. No respiratory distress. She has no wheezes. She has no rales.  Abdominal: Soft. Bowel sounds are normal. She exhibits no distension. There is no tenderness. There is no rebound and no guarding.  Musculoskeletal: She exhibits no edema or tenderness.  Neurological: She is alert. She has normal strength. No cranial nerve deficit (no  facial droop, extraocular movements intact, no slurred speech) or sensory deficit. She exhibits normal muscle tone. She displays no seizure activity. Coordination normal.  Skin: Skin is warm and dry. No rash noted. She is not diaphoretic.  Psychiatric: She has a normal mood and affect.  Nursing note and vitals reviewed.    ED Treatments / Results  Labs (all labs ordered are listed, but only abnormal results are displayed) Labs Reviewed  COMPREHENSIVE METABOLIC PANEL - Abnormal; Notable for the following components:      Result Value   Glucose, Bld 107 (*)    All other components within normal limits  CBC - Abnormal; Notable for the following components:   WBC 3.7 (*)    MCV 77.4 (*)    MCH 25.4 (*)    RDW 16.9 (*)    All other components within normal limits  PROTIME-INR  APTT  TYPE AND SCREEN     Procedures Procedures (including critical care time)  Medications Ordered in ED Medications - No data to display   Initial Impression / Assessment and Plan / ED Course  I have reviewed the triage vital signs and the nursing notes.  Pertinent labs & imaging results that were available during my care of the patient were reviewed by me and considered in my medical decision making (see chart for details).  Clinical Course as of May 18 1551  Mon May 17, 2018  1445 Patient's hemoglobin is stable   [JK]  1551 Dicussed with Azucena Freed.   Will see pt.  Agrees with admission   [JK]    Clinical Course User Index [JK] Dorie Rank, MD    Patient presented to the emergency room for evaluation of persistent rectal bleeding in the setting of known history of colon cancer.  Patient is hemodynamically stable.  She is in no distress in the emergency room.  I discussed with GI who wanted her to be admitted for further work-up.  I will consult the medical service.  Final Clinical Impressions(s) / ED Diagnoses   Final diagnoses:  Rectal bleeding      Dorie Rank, MD 05/17/18 [phone]

## 2018-05-17 NOTE — Consult Note (Signed)
Medical Consultation   Nichole Cross  XVQ:[id number]  DOB: Nov 10, 1941  DOA: 05/17/2018  PCP: [name], MD  Outpatient specialists:   Requesting physician: Dr. Dorie Rank, EDP  Reason for consultation: GI Bleeding   History of Present Illness: 84 old with a history of colon cancer status post left hemicolectomy diagnosed in 2016, and treated with chemotherapy.  Patient follows with Dr. Benay Spice.  Patient has been complaining of several large bloody bowel movements over the past 48 hours.  She presented to the gastroenterology office today and was subsequently sent to the emergency department for admission to expedite colonoscopy given patient's story.  Upon further questioning, patient states this is been ongoing for several months.  Currently she denies dizziness or lightheadedness, headache, shortness of breath.  Patient states she is feeling well today and has no complaints.  In the emergency department, hemoglobin was found to be 12.9.  Vital signs have been stable.  Review of Systems:  All other systems reviewed and are negative.    Past Medical History: Past Medical History:  Diagnosis Date  . Anemia   . Arthritis    knees  . Blood dyscrasia 2016   microcytic anemia, likely iron deficiency anemia secondary to colon cancer  . Cataracts, bilateral   . Colon cancer ([place]) 06/2015   tubular villous adenoma on sigmoid colon specimen (08/09/15), myltiple tubular adenomas removed on a colonoscopy  11/16/15  . Complication of anesthesia   . Headache(784.0)    migraines  . Hypothyroidism   . Liver metastasis ([place])   . Metastasis to kidney ([place])   . PONV (postoperative nausea and vomiting)    NAUSEA  . Renal carcinoma ([place]) 06/2016   ? primary  . Secondary liver cancer ([place]) 06/2016   ? metastasis from colon    Past Surgical History: Past Surgical History:  Procedure Laterality Date  . ABDOMINAL HYSTERECTOMY  1993   with bilateral BSO  . abdominal tumor  1993    . APPENDECTOMY    . IR GENERIC HISTORICAL  07/01/2016   IR RADIOLOGIST EVAL & MGMT 07/01/2016 Greggory Keen, MD [facility] INTERV RAD  . IR GENERIC HISTORICAL  08/14/2016   IR RADIOLOGIST EVAL & MGMT 08/14/2016 Jacqulynn Cadet, MD [facility] INTERV RAD  . IR RADIOLOGIST EVAL & MGMT  12/24/2017  . IR RADIOLOGIST EVAL & MGMT  04/22/2018  . LAPAROSCOPIC PARTIAL COLECTOMY N/A 08/09/2015   Procedure: LAPAROSCOPIC PARTIAL COLECTOMY;  Surgeon: Jackolyn Confer, MD;  Location: [facility];  Service: General;  Laterality: N/A;  . PARTIAL KNEE ARTHROPLASTY  09/09/2012   Procedure: UNICOMPARTMENTAL KNEE;  Surgeon: Mauri Pole, MD;  Location: [facility];  Service: Orthopedics;  Laterality: Left;  . XI ROBOTIC ASSISTED LOWER ANTERIOR RESECTION N/A 01/09/2017   Procedure: XI ROBOTIC ASSISTED LOWER ANTERIOR RESECTION;  Surgeon: Leighton Ruff, MD;  Location: [facility];  Service: General;  Laterality: N/A;    Allergies:   Allergies  Allergen Reactions  . Aspirin Itching  . Morphine And Related Rash    "veins popped up and they gave me benadryl    Social History:  reports that she has never smoked. She has never used smokeless tobacco. She reports that she does not drink alcohol or use drugs.  Family History: Family History  Problem Relation Age of Onset  . Pancreatic cancer Mother   . Parkinson's disease Mother   . Breast cancer Sister   . Parkinson's disease Brother   . Parkinson's disease Brother   .  Colon cancer Neg Hx     Physical Exam: Blood pressure (!) 145/76, pulse 65, temperature 97.7 F (36.5 C), temperature source Oral, resp. rate 18, SpO2 99 %.   General: Well developed, well nourished, NAD, appears stated age  HEENT: NCAT, PERRLA, EOMI, Anicteic Sclera, mucous membranes moist.   Neck: Supple, no JVD, no masses  Cardiovascular: S1 S2 auscultated, no rubs, murmurs or gallops. Regular rate and rhythm.  Respiratory: Clear to auscultation bilaterally with equal chest rise  Abdomen: Soft,  nontender, nondistended, + bowel sounds  Extremities: warm dry without cyanosis clubbing or edema  Neuro: AAOx3, cranial nerves grossly intact. Strength 5/5 in patient's upper and lower extremities bilaterally  Skin: Without rashes exudates or nodules  Psych: Normal affect and demeanor with intact judgement and insight  Data reviewed:  I have personally reviewed following labs and imaging studies Labs:  CBC: Recent Labs  Lab 05/17/18 1417  WBC 3.7*  HGB 12.9  HCT 39.3  MCV 77.4*  PLT 381    Basic Metabolic Panel: Recent Labs  Lab 05/17/18 1417  NA 139  K 4.1  CL 106  CO2 25  GLUCOSE 107*  BUN 14  CREATININE 0.88  CALCIUM 9.3   GFR Estimated Creatinine Clearance: 59.1 mL/min (by C-G formula based on SCr of 0.88 mg/dL). Liver Function Tests: Recent Labs  Lab 05/17/18 1417  AST 25  ALT 14  ALKPHOS 84  BILITOT 0.8  PROT 7.7  ALBUMIN 4.3   No results for input(s): LIPASE, AMYLASE in the last 168 hours. No results for input(s): AMMONIA in the last 168 hours. Coagulation profile Recent Labs  Lab 05/17/18 1448  INR 1.07    Cardiac Enzymes: No results for input(s): CKTOTAL, CKMB, CKMBINDEX, TROPONINI in the last 168 hours. BNP: Invalid input(s): POCBNP CBG: No results for input(s): GLUCAP in the last 168 hours. D-Dimer No results for input(s): DDIMER in the last 72 hours. Hgb A1c No results for input(s): HGBA1C in the last 72 hours. Lipid Profile No results for input(s): CHOL, HDL, LDLCALC, TRIG, CHOLHDL, LDLDIRECT in the last 72 hours. Thyroid function studies No results for input(s): TSH, T4TOTAL, T3FREE, THYROIDAB in the last 72 hours.  Invalid input(s): FREET3 Anemia work up No results for input(s): VITAMINB12, FOLATE, FERRITIN, TIBC, IRON, RETICCTPCT in the last 72 hours. Urinalysis    Component Value Date/Time   COLORURINE AMBER (A) 10/15/2016 1640   APPEARANCEUR CLEAR 10/15/2016 1640   LABSPEC 1.029 10/15/2016 1640   PHURINE 5.0  10/15/2016 1640   GLUCOSEU NEGATIVE 10/15/2016 1640   HGBUR NEGATIVE 10/15/2016 1640   HGBUR negative 04/13/2009 0902   BILIRUBINUR MODERATE (A) 10/15/2016 1640   KETONESUR NEGATIVE 10/15/2016 1640   PROTEINUR NEGATIVE 10/15/2016 1640   UROBILINOGEN 1.0 09/08/2012 1055   NITRITE NEGATIVE 10/15/2016 1640   LEUKOCYTESUR TRACE (A) 10/15/2016 1640    Sepsis Labs Invalid input(s): PROCALCITONIN,  WBC,  LACTICIDVEN Microbiology No results found for this or any previous visit (from the past 240 hour(s)).  Inpatient Medications:   Scheduled Meds: Continuous Infusions:  Radiological Exams on Admission: No results found.  Impression/Recommendations  GI bleed in the setting of history of colon cancer Complains of 4-5 blood bowel movements for the past several day.  Has not had a bowel movement today, but has noticed some bleeding.  Patient presented to the GI office earlier today, was subsequently sent to the ED for admission to expedite colonoscopy.  Upon examination, patient was found to be hemodynamically stable and asymptomatic.  Hemoglobin found to be 12.9.  Upon further questioning, patient does admit this is been ongoing for the past several months.  She also complained of this to her oncologist back in April stating that this has been ongoing since March 2019. Recommended patient not use NSAIDS.   Discussed with gastroenterology, patient can have colonoscopy done on 05/19/2018 outpatient at 8:30 AM.  Office will contact patient with medications and further details.  Patient agrees to going home at this time.  Discussed symptoms and signs to be aware of and when to return to the emergency department.  Discussed with EDP, Dr. Tomi Bamberger, patient can be discharged home to follow-up with gastroenterology on 05/19/2018 for outpatient colonoscopy.  Thank you for the consultation and allowing [place] to participate in the care and management of your patient. Will continue to follow the patient with  you.  Time Spent: 9 minutes  Seraj Dunnam D.O. [facility] 05/17/2018, 4:38 PM

## 2018-05-17 NOTE — Discharge Instructions (Addendum)
Follow up with the GI doctor as an outpatient, as discussed, return for worsening symptoms

## 2018-05-17 NOTE — Progress Notes (Deleted)
Seen earlier today by PA in the office regarding rectal bleeding.  Patient has history of metastatic to liver colon cancer ( stage 2 T3 N0) with sigmoid colectomy 2016 along with thermal ablation of liver mets.  Underwent chemo (Xeloda).  Required surgical resection of tumor recurrence in 12/2016. MRI 04/22/2018 showed a new mass in the left paracolic gutter, probably enlarging peritoneal mets.  No new liver mets.  Previously identified right kidney mass stable.  Patient last met with oncology 03/16/2018  She is been seeing blood on the tissue associated with bowel movements.  The bleeding progressed over the last 3 days. Lead pressure and pulse okay at the office. She was referred to the ED for likely admission. Hgb 12.9, hematocrit 39.3.  MCV 77.4.  The plan is for admission to the hospitalist to expedite further work-up. We will check on pt tomorrow.    Azucena Freed PA-C.

## 2018-05-17 NOTE — Telephone Encounter (Signed)
Pt states she has been having rectal bleeding and over the weekend it got worse. Reports it is BRB and there are some clots present. Pt has h/o cancer, called Dr. Benay Spice and he suggested she be seen by GI. Pt scheduled to see Ellouise Newer PA today at 10:45am. Pt aware of appt.

## 2018-05-17 NOTE — Progress Notes (Addendum)
Chief Complaint: Rectal bleeding, history of colon cacner  HPI:    [name] is a 77 year old female with a past medical history as listed below including colon cancer status post left hemicolectomy diagnosed in 2016 with metastases static disease to liver status post CT-guided percutaneous thermal ablation 07/25/2016, with repeat surgical resection for local tumor recurrence 01/09/2017, who follows with Dr. Hilarie Fredrickson and presents clinic today with a complaint of rectal bleeding.    10/30/2016 colonoscopy done for finding of colitis not related to Xeloda with finding of a likely malignant partially obstructing tumor at the colonic anastomosis representing tumor recurrence, diverticulosis in the descending colon, internal hemorrhoids and otherwise normal.  Is recommended patient have close follow-up with Dr. Malachy Mood and likely surgery and have repeat colonoscopy in 1 year.  Per Dr. Ammie Dalton recommendations surveillance colonoscopy was delayed.    2 months ago normal CEA of 3.6.  CBC showed a white count low at 3.3 and otherwise normal.    04/22/2018 MRI of the abdomen with and without contrast showed interval growth of enhancing 1.7 cm peritoneal mass in the left paracolic gutter, compatible with enlarging peritoneal metastasis, small enhancing extrahepatic 1 cm nodule anterior to the segment to the left liver lobe capsule, stable since visit 12/14/2017.  No evidence of local tumor recurrence at the ablation sites in the segment 2 and segment 6 liver.  No new liver mets.  Continued stability of avidly enhancing 2.9 cm renal cortical mass in the anterior upper right kidney, for which clear-cell renal cell carcinoma is a diagnosis of exclusion.  Stable mild splenomegaly and diffuse hepatic steatosis.    Today, explains that she has been having one episode of a small amount of bright red blood on the toilet paper over the past few months which she thought was hemorrhoidal as this was always with a constipated BM, but  over the weekend this became heavier and worsened.  She describes at least 5 bowel movements and sometimes blood without a bowel movement which is "a lot" also with some clots for the past 48 hours.  She has already had one this morning about 30 minutes ago.    Denies fever, chills, SOB, DOE, palpitations, abdominal pain or rectal pain.  Past Medical History:  Diagnosis Date  . Anemia   . Arthritis    knees  . Blood dyscrasia 2016   microcytic anemia, likely iron deficiency anemia secondary to colon cancer  . Cataracts, bilateral   . Colon cancer ([place]) 06/2015   tubular villous adenoma on sigmoid colon specimen (08/09/15), myltiple tubular adenomas removed on a colonoscopy  11/16/15  . Complication of anesthesia   . Headache(784.0)    migraines  . Hypothyroidism   . Liver metastasis ([place])   . Metastasis to kidney ([place])   . PONV (postoperative nausea and vomiting)    NAUSEA  . Renal carcinoma ([place]) 06/2016   ? primary  . Secondary liver cancer ([place]) 06/2016   ? metastasis from colon    Past Surgical History:  Procedure Laterality Date  . ABDOMINAL HYSTERECTOMY  1993   with bilateral BSO  . abdominal tumor  1993  . APPENDECTOMY    . IR GENERIC HISTORICAL  07/01/2016   IR RADIOLOGIST EVAL & MGMT 07/01/2016 Greggory Keen, MD [facility] INTERV RAD  . IR GENERIC HISTORICAL  08/14/2016   IR RADIOLOGIST EVAL & MGMT 08/14/2016 Jacqulynn Cadet, MD [facility] INTERV RAD  . IR RADIOLOGIST EVAL & MGMT  12/24/2017  . IR RADIOLOGIST EVAL &  MGMT  04/22/2018  . LAPAROSCOPIC PARTIAL COLECTOMY N/A 08/09/2015   Procedure: LAPAROSCOPIC PARTIAL COLECTOMY;  Surgeon: Jackolyn Confer, MD;  Location: [facility];  Service: General;  Laterality: N/A;  . PARTIAL KNEE ARTHROPLASTY  09/09/2012   Procedure: UNICOMPARTMENTAL KNEE;  Surgeon: Mauri Pole, MD;  Location: [facility];  Service: Orthopedics;  Laterality: Left;  . XI ROBOTIC ASSISTED LOWER ANTERIOR RESECTION N/A 01/09/2017   Procedure: XI ROBOTIC ASSISTED LOWER  ANTERIOR RESECTION;  Surgeon: Leighton Ruff, MD;  Location: [facility];  Service: General;  Laterality: N/A;    Current Outpatient Medications  Medication Sig Dispense Refill  . acetaminophen (TYLENOL) 500 MG tablet Take 500 mg by mouth.    . diphenhydrAMINE (BENADRYL) 50 MG capsule Take 50 mg by mouth at bedtime as needed.    . ferrous sulfate 325 (65 FE) MG EC tablet Take 325 mg by mouth 3 (three) times daily with meals.    Marland Kitchen levothyroxine (SYNTHROID, LEVOTHROID) 150 MCG tablet Take 1 tablet (150 mcg total) by mouth daily. 30 tablet 3  . loperamide (IMODIUM) 2 MG capsule Take 2 mg by mouth as needed for diarrhea or loose stools.    . polyethylene glycol (MIRALAX / GLYCOLAX) packet Take 17 g by mouth daily as needed for mild constipation. Reported on 06/05/2016    . traMADol (ULTRAM) 50 MG tablet Take 1 tablet (50 mg total) by mouth every 6 (six) hours as needed. 60 tablet 0   No current facility-administered medications for this visit.     Allergies as of 05/17/2018 - Review Complete 04/22/2018  Allergen Reaction Noted  . Aspirin Itching 08/08/2008  . Morphine and related Rash 10/17/2016    Family History  Problem Relation Age of Onset  . Pancreatic cancer Mother   . Parkinson's disease Mother   . Breast cancer Sister   . Parkinson's disease Brother   . Parkinson's disease Brother   . Colon cancer Neg Hx     Social History   Socioeconomic History  . Marital status: Divorced    Spouse name: Not on file  . Number of children: Not on file  . Years of education: Not on file  . Highest education level: Not on file  Occupational History  . Not on file  Social Needs  . Financial resource strain: Not on file  . Food insecurity:    Worry: Not on file    Inability: Not on file  . Transportation needs:    Medical: Not on file    Non-medical: Not on file  Tobacco Use  . Smoking status: Never Smoker  . Smokeless tobacco: Never Used  Substance and Sexual Activity  . Alcohol use:  No  . Drug use: No  . Sexual activity: Not on file  Lifestyle  . Physical activity:    Days per week: Not on file    Minutes per session: Not on file  . Stress: Not on file  Relationships  . Social connections:    Talks on phone: Not on file    Gets together: Not on file    Attends religious service: Not on file    Active member of club or organization: Not on file    Attends meetings of clubs or organizations: Not on file    Relationship status: Not on file  . Intimate partner violence:    Fear of current or ex partner: Not on file    Emotionally abused: Not on file    Physically abused: Not on file  Forced sexual activity: Not on file  Other Topics Concern  . 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    Review of Systems:    Constitutional: No weight loss, fever or chills Skin: No rash  Cardiovascular: No chest pain Respiratory: No SOB  Gastrointestinal: See HPI and otherwise negative Genitourinary: No dysuria Neurological: No headache, dizziness or syncope Musculoskeletal: No new muscle or joint pain Hematologic: No bleeding or bruising Psychiatric: No history of depression or anxiety   Physical Exam:  Vital signs: BP 128/76   Pulse 72   Ht 5\' 7"  (1.702 m)   Wt 164 lb 9.6 oz (74.7 kg)   BMI 25.78 kg/m    Constitutional:   Pleasant elderly Caucasian female appears to be in NAD, Well developed, Well nourished, alert and cooperative Head:  Normocephalic and atraumatic. Eyes:   PEERL, EOMI. No icterus. Conjunctiva pink. Ears:  Normal auditory acuity. Neck:  Supple Throat: Oral cavity and pharynx without inflammation, swelling or lesion.  Respiratory: Respirations even and unlabored. Lungs clear to auscultation bilaterally.   No wheezes, crackles,  or rhonchi.  Cardiovascular: Normal S1, S2. No MRG. Regular rate and rhythm. No peripheral edema, cyanosis or pallor.  Gastrointestinal:  Soft, nondistended, nontender. No rebound or guarding. Normal bowel sounds. No appreciable masses or hepatomegaly. Rectal: External: Visible bright red blood, external hemorrhoid tag; internal: No TTP, again right red blood mixed with some brown stool Msk:  Symmetrical without gross deformities. Without edema, no deformity or joint abnormality.  Neurologic:  Alert and  oriented x4;  grossly normal neurologically.  Skin:   Dry and intact without significant lesions or rashes. Psychiatric: Demonstrates good judgement and reason without abnormal affect or behaviors.  RELEVANT LABS AND IMAGING: CBC    Component Value Date/Time   WBC 3.3 (L) 03/16/2018 0951   WBC 3.1 (L) 11/11/2017 1403   WBC 5.3 01/14/2017 0430   RBC 5.22 03/16/2018 0951   HGB 12.5 03/16/2018 0951   HGB 10.8 (L) 11/11/2017 1403   HCT 38.8 03/16/2018 0951   HCT 34.4 (L) 11/11/2017 1403   PLT 207 03/16/2018 0951   PLT 193 11/11/2017 1403   MCV 74.4 (L) 03/16/2018 0951   MCV 77.8 (L) 11/11/2017 1403   MCH 24.0 (L) 03/16/2018 0951   MCHC 32.2 03/16/2018 0951   RDW 19.2 (H) 03/16/2018 0951   RDW 19.6 (H) 11/11/2017 1403   LYMPHSABS 0.9 03/16/2018 0951   LYMPHSABS 1.0 11/11/2017 1403   MONOABS 0.3 03/16/2018 0951   MONOABS 0.3 11/11/2017 1403   EOSABS 0.1 03/16/2018 0951   EOSABS 0.1 11/11/2017 1403   BASOSABS 0.0 03/16/2018 0951   BASOSABS 0.0 11/11/2017 1403    CMP     Component Value Date/Time   NA 137 04/14/2018 1420   NA 138 05/27/2017 1408   K 4.0 04/14/2018 1420   K 3.9 05/27/2017 1408   CL 105 04/14/2018 1420   CO2 22 04/14/2018 1420   CO2 23 05/27/2017 1408   GLUCOSE 99 04/14/2018 1420   GLUCOSE 93 05/27/2017 1408   BUN 12 04/14/2018 1420   BUN 13.8 05/27/2017 1408   CREATININE 0.99 (H) 04/14/2018 1420   CREATININE 1.1 05/27/2017 1408   CALCIUM 9.5 04/14/2018  1420  CALCIUM 9.5 05/27/2017 1408   PROT 7.2 04/14/2018 1420   PROT 7.3 05/27/2017 1408   ALBUMIN 3.9 12/14/2017 0859   ALBUMIN 4.0 05/27/2017 1408   AST 26 04/14/2018 1420   AST 20 05/27/2017 1408   ALT 14 04/14/2018 1420   ALT 8 05/27/2017 1408   ALKPHOS 88 12/14/2017 0859   ALKPHOS 78 05/27/2017 1408   BILITOT 0.4 04/14/2018 1420   BILITOT 0.53 05/27/2017 1408   GFRNONAA 56 (L) 04/14/2018 1420   GFRAA 65 04/14/2018 1420    Assessment: 1.  Hematochezia: minimal amt with BM over the past few months, now increased to at least 5 bloody bowel movements/d for past 48 hours, continues this morning, rectal exam with no obvious source; concern for repeat colon cancer versus diverticulosis versus other 2.  History of metastatic colon cancer: 2016, recurrence in 2017, status post repeat resection in 2018, follows with Dr. Benay Spice, CEA 2 months ago was normal, recent MRI with interval increase in peritoneal mass  Plan: 1.  Patient with increased 4-5 episodes of bright red blood per rectum for the past 48 hours and again this morning.  Advised patient to proceed to the ER where she will likely be admitted. 2.  Did call our APP on call Tye Savoy, NP and discussed patient.  Our service will still need to be consulted once there are plans for patient's admission with the hospitalist service. 3.  Patient will likely benefit from repeat colonoscopy given her history of colon cancer. 4.  Recommend labs including CBC and CMP at time of admission.  Patient did have recent MRI of the abdomen and likely requires no further imaging.  Would also not recommend a Hemoccult study as we can clearly see there is blood from rectum. 5.  Please await any further recommendations from our inpatient team.  Ellouise Newer, PA-C [facility] 05/17/2018, 10:34 AM  Cc: [name], MD   Addendum: Reviewed and agree with initial management. Jerene Bears, MD  Addendum: 05/17/18 Patient seen in  ER- hgb stable- stable from their viewpoint, we will have patient come to [facility] for Colonoscopy with Dr. Loletha Carrow at 9:30 this Wednesday, 05/19/18. Patient is very anxious given history of colon cancer. She will then follow up with her primary GI physician Dr. Hilarie Fredrickson as needed in the future. Ellouise Newer, PA-C

## 2018-05-17 NOTE — ED Notes (Signed)
MD at bedside. 

## 2018-05-18 ENCOUNTER — Other Ambulatory Visit: Payer: Self-pay

## 2018-05-18 ENCOUNTER — Ambulatory Visit (AMBULATORY_SURGERY_CENTER): Payer: Self-pay

## 2018-05-18 VITALS — Ht 67.0 in | Wt 163.6 lb

## 2018-05-18 DIAGNOSIS — K921 Melena: Secondary | ICD-10-CM

## 2018-05-18 MED ORDER — PEG-KCL-NACL-NASULF-NA ASC-C 140 G PO SOLR: 1.0000 | Freq: Once | ORAL | 0 refills | Status: AC

## 2018-05-18 NOTE — Progress Notes (Signed)
Denies allergies to eggs or soy products. Denies complication of anesthesia or sedation. Denies use of weight loss medication. Denies use of O2.   Emmi instructions declined.  

## 2018-05-19 ENCOUNTER — Encounter: Payer: Self-pay | Admitting: Gastroenterology

## 2018-05-19 ENCOUNTER — Ambulatory Visit (AMBULATORY_SURGERY_CENTER): Payer: Medicare Other | Admitting: Gastroenterology

## 2018-05-19 VITALS — BP 133/76 | HR 63 | Temp 98.9°F | Resp 12 | Ht 67.0 in | Wt 163.0 lb

## 2018-05-19 DIAGNOSIS — C20 Malignant neoplasm of rectum: Secondary | ICD-10-CM | POA: Diagnosis not present

## 2018-05-19 DIAGNOSIS — K6289 Other specified diseases of anus and rectum: Secondary | ICD-10-CM

## 2018-05-19 DIAGNOSIS — K921 Melena: Secondary | ICD-10-CM

## 2018-05-19 MED ORDER — SODIUM CHLORIDE 0.9 % IV SOLN: 500.0000 mL | Freq: Once | INTRAVENOUS | Status: DC

## 2018-05-19 NOTE — Patient Instructions (Signed)
Miralax one capful once to twice daily to maintain regularity. Refer to an oncologist.   YOU HAD AN ENDOSCOPIC PROCEDURE TODAY AT [facility]:   Refer to the procedure report that was given to you for any specific questions about what was found during the examination.  If the procedure report does not answer your questions, please call your gastroenterologist to clarify.  If you requested that your care partner not be given the details of your procedure findings, then the procedure report has been included in a sealed envelope for you to review at your convenience later.  YOU SHOULD EXPECT: Some feelings of bloating in the abdomen. Passage of more gas than usual.  Walking can help get rid of the air that was put into your GI tract during the procedure and reduce the bloating. If you had a lower endoscopy (such as a colonoscopy or flexible sigmoidoscopy) you may notice spotting of blood in your stool or on the toilet paper. If you underwent a bowel prep for your procedure, you may not have a normal bowel movement for a few days.  Please Note:  You might notice some irritation and congestion in your nose or some drainage.  This is from the oxygen used during your procedure.  There is no need for concern and it should clear up in a day or so.  SYMPTOMS TO REPORT IMMEDIATELY:   Following lower endoscopy (colonoscopy or flexible sigmoidoscopy):  Excessive amounts of blood in the stool  Significant tenderness or worsening of abdominal pains  Swelling of the abdomen that is new, acute  Fever of 100F or higher    For urgent or emergent issues, a gastroenterologist can be reached at any hour by calling [phone].   DIET:  We do recommend a small meal at first, but then you may proceed to your regular diet.  Drink plenty of fluids but you should avoid alcoholic beverages for 24 hours.  ACTIVITY:  You should plan to take it easy for the rest of today and you should NOT DRIVE or  use heavy machinery until tomorrow (because of the sedation medicines used during the test).    FOLLOW UP: Our staff will call the number listed on your records the next business day following your procedure to check on you and address any questions or concerns that you may have regarding the information given to you following your procedure. If we do not reach you, we will leave a message.  However, if you are feeling well and you are not experiencing any problems, there is no need to return our call.  We will assume that you have returned to your regular daily activities without incident.  If any biopsies were taken you will be contacted by phone or by letter within the next 1-3 weeks.  Please call us at [phone] if you have not heard about the biopsies in 3 weeks.    SIGNATURES/CONFIDENTIALITY: You and/or your care partner have signed paperwork which will be entered into your electronic medical record.  These signatures attest to the fact that that the information above on your After Visit Summary has been reviewed and is understood.  Full responsibility of the confidentiality of this discharge information lies with you and/or your care-partner.

## 2018-05-19 NOTE — Op Note (Signed)
[facility] Patient Name: Nichole Cross Procedure Date: 05/19/2018 9:41 AM MRN: [id number] Endoscopist: Mallie Mussel L. Loletha Carrow , MD Age: 77 Referring MD:  Date of Birth: [birth year] Gender: Female Account #: [id number] Procedure:                Colonoscopy Indications:              Rectal bleeding, Personal history of malignant                            neoplasm of the colon (known metastatic disease) Medicines:                Monitored Anesthesia Care Procedure:                Pre-Anesthesia Assessment:                           - Prior to the procedure, a History and Physical                            was performed, and patient medications and                            allergies were reviewed. The patient's tolerance of                            previous anesthesia was also reviewed. The risks                            and benefits of the procedure and the sedation                            options and risks were discussed with the patient.                            All questions were answered, and informed consent                            was obtained. Prior Anticoagulants: The patient has                            taken no previous anticoagulant or antiplatelet                            agents. ASA Grade Assessment: III - A patient with                            severe systemic disease. After reviewing the risks                            and benefits, the patient was deemed in                            satisfactory condition to undergo the procedure.  After obtaining informed consent, the colonoscope                            was passed under direct vision. Throughout the                            procedure, the patient's blood pressure, pulse, and                            oxygen saturations were monitored continuously. The                            Colonoscope was introduced through the anus and                            advanced to  the the cecum, identified by                            appendiceal orifice and ileocecal valve. The                            colonoscopy was performed without difficulty. The                            patient tolerated the procedure well. The quality                            of the bowel preparation was good. The ileocecal                            valve, appendiceal orifice, and rectum were                            photographed. Scope In: 6:73:41 AM Scope Out: 10:10:09 AM Scope Withdrawal Time: 0 hours 15 minutes 21 seconds  Total Procedure Duration: 0 hours 20 minutes 55 seconds  Findings:                 The digital rectal exam findings include palpable                            rectal mass.                           There was evidence of a prior end-to-end                            colo-colonic anastomosis in the rectum. This was                            characterized by an intact staple line. The                            anastomosis was traversed.  A fungating, infiltrative and ulcerated partially                            obstructing mass was found in the rectum at the                            anastomotic site (staples within the mass). The                            mass was partially circumferential (involving                            two-thirds to three-quarters of the lumen                            circumference). The mass measured four cm in                            length, with the distal aspect at most 3cm fro                            manal verge with rectum insufflated. No bleeding                            was present. Mucosa was biopsied with a cold                            forceps for histology. One specimen bottle was sent                            to pathology. Area 3-5 cm proximal to the mass was                            tattooed with an injection of 1.5 mL of Spot (3                            injections of 0.5  ml each).                           The exam was otherwise without abnormality. Complications:            No immediate complications. Estimated Blood Loss:     Estimated blood loss was minimal. Impression:               - Palpable rectal mass found on digital rectal exam.                           - End-to-end colo-colonic anastomosis,                            characterized by an intact staple line.                           - Likely malignant partially obstructing tumor  in                            the rectum. Biopsied. Tattooed.                           - The examination was otherwise normal. Recommendation:           - Patient has a contact number available for                            emergencies. The signs and symptoms of potential                            delayed complications were discussed with the                            patient. Return to normal activities tomorrow.                            Written discharge instructions were provided to the                            patient.                           - Resume previous diet.                           - Continue present medications.                           - Await pathology results.                           - Repeat colonoscopy is recommended for                            surveillance. The colonoscopy date will be                            determined after pathology results from today's                            exam become available for review.                           - Refer to an oncologist.                           - Miralax one capful once to twice daily to                            maintain regularity. [name], MD 05/19/2018 10:27:46 AM This report has been signed electronically.

## 2018-05-19 NOTE — Progress Notes (Signed)
Send pathology specimen [facility] per Dr. Loletha Carrow. maw

## 2018-05-19 NOTE — Progress Notes (Signed)
Called to room to assist during endoscopic procedure.  Patient ID and intended procedure confirmed with present staff. Received instructions for my participation in the procedure from the performing physician.  

## 2018-05-19 NOTE — Progress Notes (Signed)
A and O x3. Report to RN. Tolerated MAC anesthesia well.

## 2018-05-24 ENCOUNTER — Telehealth: Payer: Self-pay

## 2018-05-24 NOTE — Telephone Encounter (Signed)
Left message

## 2018-05-25 ENCOUNTER — Telehealth: Payer: Self-pay | Admitting: Oncology

## 2018-05-25 ENCOUNTER — Inpatient Hospital Stay: Payer: Medicare Other | Attending: Oncology | Admitting: Oncology

## 2018-05-25 DIAGNOSIS — C187 Malignant neoplasm of sigmoid colon: Secondary | ICD-10-CM | POA: Diagnosis not present

## 2018-05-25 DIAGNOSIS — D509 Iron deficiency anemia, unspecified: Secondary | ICD-10-CM | POA: Diagnosis not present

## 2018-05-25 DIAGNOSIS — C189 Malignant neoplasm of colon, unspecified: Secondary | ICD-10-CM | POA: Diagnosis not present

## 2018-05-25 DIAGNOSIS — C787 Secondary malignant neoplasm of liver and intrahepatic bile duct: Secondary | ICD-10-CM | POA: Diagnosis not present

## 2018-05-25 DIAGNOSIS — N289 Disorder of kidney and ureter, unspecified: Secondary | ICD-10-CM | POA: Diagnosis not present

## 2018-05-25 DIAGNOSIS — Z85038 Personal history of other malignant neoplasm of large intestine: Secondary | ICD-10-CM | POA: Insufficient documentation

## 2018-05-25 MED ORDER — TRAMADOL HCL 50 MG PO TABS: 50.0000 mg | ORAL_TABLET | Freq: Four times a day (QID) | ORAL | 0 refills | Status: DC | PRN

## 2018-05-25 NOTE — Progress Notes (Signed)
[facility] OFFICE PROGRESS NOTE   Diagnosis: Colon cancer  INTERVAL HISTORY:   Nichole Cross returns as scheduled.  She reports feeling well.  She has intermittent discomfort at the sacrum.  She reports rectal bleeding.  She underwent a colonoscopy by Dr. Loletha Carrow on 05/19/2018.  This confirmed a mass at the rectal anastomosis beginning at 3 cm from the anal verge.  A biopsy confirmed invasive adenocarcinoma. A pelvic MRI obtained by Dr. Laurence Ferrari on 04/22/2018 revealed stable ablation sites in the liver and a stable renal mass.  There is an enlarging nodule at the left pericolic gutter.  Objective:  Vital signs in last 24 hours:  Blood pressure 140/83, pulse 67, temperature 97.6 F (36.4 C), temperature source Oral, resp. rate 18, height _0  (1.702 m), weight 163 lb 6.4 oz (74.1 kg), SpO2 100 %.    HEENT: Neck without mass Lymphatics: No cervical, supraclavicular, axillary, or inguinal nodes Resp: Lungs clear bilaterally Cardio: Regular rate and rhythm GI: No hepatosplenomegaly, no mass, nontender Vascular: No leg edema   Lab Results:  Lab Results  Component Value Date   WBC 3.7 (L) 05/17/2018   HGB 12.9 05/17/2018   HCT 39.3 05/17/2018   MCV 77.4 (L) 05/17/2018   PLT 217 05/17/2018   NEUTROABS 2.1 03/16/2018    CMP  Lab Results  Component Value Date   NA 139 05/17/2018   K 4.1 05/17/2018   CL 106 05/17/2018   CO2 25 05/17/2018   GLUCOSE 107 (H) 05/17/2018   BUN 14 05/17/2018   CREATININE 0.88 05/17/2018   CALCIUM 9.3 05/17/2018   PROT 7.7 05/17/2018   ALBUMIN 4.3 05/17/2018   AST 25 05/17/2018   ALT 14 05/17/2018   ALKPHOS 84 05/17/2018   BILITOT 0.8 05/17/2018   GFRNONAA >60 05/17/2018   GFRAA >60 05/17/2018    Lab Results  Component Value Date   CEA1 3.60 03/16/2018     Medications: I have reviewed the patient's current medications.   Assessment/Plan: 1. Adenocarcinoma the distal sigmoid colon, poorly differentiated, stage II (T3 N0),  status post a laparoscopic assisted sigmoid colectomy 08/09/2015  No loss of mismatch repair protein expression  Elevated preoperative CEA  Staging CT of the abdomen and pelvis 06/22/2015 with no evidence of distant metastatic disease  Mildly elevated CEA 02/23/2017And 04/17/2016  CTs chest, abdomen, and pelvis on 06/04/2016, 16m left upper lobe nodule-no comparison available, new hypodense lesion in the left liver, solid Right renal mass  MRI 06/10/2016-2 enhancing lesions in the liver consistent with metastatic disease, segment 2 and segment 5. Solid enhancing right renal lesion consistent with a renal cell carcinoma  Radiofrequency ablation of 2 liver lesions on 07/25/2016  Cycle 1 adjuvant Xeloda 08/18/2016  Cycle 2 Xeloda 09/08/2016  CEA improved 09/26/2016  Cycle 3 Xeloda held 09/29/2016 due to unexplained abdominal pain,referred for CT  CT abdomen/pelvis 09/30/2016 with a long segment of bowel wall thickening involving the distal ileum; lesion left hepatic lobe similar to slightly smaller following ablation; lesion posterior right hepatic lobe elongated favored to represent treatment tract.  Cycle 3 Xeloda 10/10/2016 (dose reduced due to drug induced enteritis following cycle 2)discontinued 10/15/2016 secondary to abdominal pain.  CT in while the emergency room with abdominal pain 10/15/2016 revealed descending colitis  Colonoscopy 10/30/2016 confirmed a mass at the: colo-colonicanastomosis  Cycle 4 Xeloda 11/03/2016  01/09/2017 status post a low anterior resection for removal of locally recurrent tumor at the sigmoid anastomosis, resection margins negative, tumor invades pericolonic soft  tissue, 12 benign lymph nodes;MSI stabl  Cycle 5 Xeloda 02/12/2017  Cycle 6 Xeloda 03/12/2017, Xeloda dose reduced to 1000 mg twice daily on 03/18/2017  Cycle 7 Xeloda 04/09/2017  Cycle 8 Xeloda 05/12/2017  Restaging CTs 06/24/2017-no evidence of metastatic disease involving  the chest. Post ablation changes in the liver. No new hepatic metastatic disease.  Restaging CTs 12/14/2017- stable liver ablation sites, indeterminate nodule adjacent to the descending colon, stable left renal mass, changes of early cirrhosis, enlarged porta hepatis node-likely reactive  MRI abdomen 04/22/2018- enlargement of 1.7 cm left paracolic gutter mass, stable 1 cm extrahepatic left liver capsule lesion, no evidence of local tumor recurrence at the ablation sites, no new liver lesions, stable right kidney mass  Colonoscopy 05/19/2018- mass in the rectum at the anastomotic site beginning at 3 cm from the anal verge, biopsy confirmed invasive adenocarcinoma   2.History ofMicrocytic anemia-likely iron deficiency anemia secondary to colon cancer, persistent microcytic anemia; ferrous sulfate increased to twice daily 12/15/2017; hemoglobin in normal range 03/16/2018, persistent microcytosis  3.Tubular villous adenoma on the sigmoid colon resection specimen 08/09/2015  Multiple tubular adenomas removed on a colonoscopy 11/16/2015  4. Right renal mass on CT 06/04/2016-suspicious for renal cell carcinoma;stable on CT 06/24/2017;referred to urology  5. History ofneutropenia secondary to chemotherapy   Disposition:  Nichole Cross has a history of distal colon cancer.  She has been diagnosed with a another local recurrence of colon cancer at the rectal anastomosis.  A recent MRI confirmed an enlarging nodule at the left pericolic gutter.  I discussed the MRI and colonoscopy findings with Nichole Cross and her daughter.  She will undergo restaging CTs and return for an office visit in 2 weeks.  She may be a candidate for surgery or palliative radiation.  I will present her case at the GI tumor conference on 06/09/2018.  25 minutes were spent with the patient today.  The majority of the time was used for counseling and coordination of care.  [name], MD  05/25/2018  10:40 AM

## 2018-05-25 NOTE — Telephone Encounter (Signed)
Appointments scheduled / moved CT scan / AVSCalendar printed per 7/9 los

## 2018-06-07 ENCOUNTER — Ambulatory Visit (HOSPITAL_COMMUNITY)
Admission: RE | Admit: 2018-06-07 | Discharge: 2018-06-07 | Disposition: A | Discharge status: Home / Self Care | Payer: Medicare Other | Source: Ambulatory Visit | Attending: Nurse Practitioner | Admitting: Nurse Practitioner

## 2018-06-07 ENCOUNTER — Encounter (HOSPITAL_COMMUNITY): Payer: Self-pay

## 2018-06-07 ENCOUNTER — Inpatient Hospital Stay: Payer: Medicare Other

## 2018-06-07 DIAGNOSIS — C787 Secondary malignant neoplasm of liver and intrahepatic bile duct: Secondary | ICD-10-CM | POA: Diagnosis present

## 2018-06-07 DIAGNOSIS — R918 Other nonspecific abnormal finding of lung field: Secondary | ICD-10-CM | POA: Diagnosis not present

## 2018-06-07 DIAGNOSIS — C189 Malignant neoplasm of colon, unspecified: Secondary | ICD-10-CM | POA: Insufficient documentation

## 2018-06-07 DIAGNOSIS — C187 Malignant neoplasm of sigmoid colon: Secondary | ICD-10-CM | POA: Diagnosis not present

## 2018-06-07 LAB — CBC WITH DIFFERENTIAL (CANCER CENTER ONLY)
Basophils Absolute: 0 10*3/uL (ref 0.0–0.1)
Basophils Relative: 1 %
Eosinophils Absolute: 0.1 10*3/uL (ref 0.0–0.5)
Eosinophils Relative: 2 %
HEMATOCRIT: 40.4 % (ref 34.8–46.6)
Hemoglobin: 13 g/dL (ref 11.6–15.9)
LYMPHS ABS: 0.8 10*3/uL — AB (ref 0.9–3.3)
Lymphocytes Relative: 21 %
MCH: 25.4 pg (ref 25.1–34.0)
MCHC: 32.1 g/dL (ref 31.5–36.0)
MCV: 79.1 fL — AB (ref 79.5–101.0)
Monocytes Absolute: 0.3 10*3/uL (ref 0.1–0.9)
Monocytes Relative: 8 %
NEUTROS ABS: 2.5 10*3/uL (ref 1.5–6.5)
Neutrophils Relative %: 68 %
Platelet Count: 240 10*3/uL (ref 145–400)
RBC: 5.11 MIL/uL (ref 3.70–5.45)
RDW: 18.8 % — ABNORMAL HIGH (ref 11.2–14.5)
WBC: 3.7 10*3/uL — AB (ref 3.9–10.3)

## 2018-06-07 LAB — CMP (CANCER CENTER ONLY)
ALT: 12 U/L (ref 0–44)
AST: 27 U/L (ref 15–41)
Albumin: 4.3 g/dL (ref 3.5–5.0)
Alkaline Phosphatase: 111 U/L (ref 38–126)
Anion gap: 11 (ref 5–15)
BUN: 10 mg/dL (ref 8–23)
CO2: 25 mmol/L (ref 22–32)
Calcium: 10 mg/dL (ref 8.9–10.3)
Chloride: 105 mmol/L (ref 98–111)
Creatinine: 1.03 mg/dL — ABNORMAL HIGH (ref 0.44–1.00)
GFR, EST NON AFRICAN AMERICAN: 52 mL/min — AB (ref >60)
GFR, Est AFR Am: >60 mL/min (ref >60)
Glucose, Bld: 106 mg/dL — ABNORMAL HIGH (ref 70–99)
POTASSIUM: 4.8 mmol/L (ref 3.5–5.1)
SODIUM: 141 mmol/L (ref 135–145)
Total Bilirubin: 0.5 mg/dL (ref 0.3–1.2)
Total Protein: 8 g/dL (ref 6.5–8.1)

## 2018-06-07 LAB — CEA (IN HOUSE-CHCC): CEA (CHCC-In House): 15 ng/mL — ABNORMAL HIGH (ref 0.00–5.00)

## 2018-06-07 MED ORDER — IOHEXOL 300 MG/ML  SOLN
75.0000 mL | Freq: Once | INTRAMUSCULAR | Status: AC | PRN
  Administered 2018-06-07: 75 mL via INTRAVENOUS

## 2018-06-09 ENCOUNTER — Inpatient Hospital Stay (HOSPITAL_BASED_OUTPATIENT_CLINIC_OR_DEPARTMENT_OTHER): Payer: Medicare Other | Admitting: Oncology

## 2018-06-09 VITALS — BP 157/75 | HR 60 | Temp 97.6°F | Resp 18 | Ht 67.0 in | Wt 164.0 lb

## 2018-06-09 DIAGNOSIS — C189 Malignant neoplasm of colon, unspecified: Secondary | ICD-10-CM

## 2018-06-09 DIAGNOSIS — D509 Iron deficiency anemia, unspecified: Secondary | ICD-10-CM | POA: Diagnosis not present

## 2018-06-09 DIAGNOSIS — N289 Disorder of kidney and ureter, unspecified: Secondary | ICD-10-CM | POA: Diagnosis not present

## 2018-06-09 DIAGNOSIS — Z85038 Personal history of other malignant neoplasm of large intestine: Secondary | ICD-10-CM | POA: Diagnosis not present

## 2018-06-09 DIAGNOSIS — C187 Malignant neoplasm of sigmoid colon: Secondary | ICD-10-CM

## 2018-06-09 DIAGNOSIS — C787 Secondary malignant neoplasm of liver and intrahepatic bile duct: Principal | ICD-10-CM

## 2018-06-09 MED ORDER — HYDROCODONE-ACETAMINOPHEN 5-325 MG PO TABS: 1.0000 | ORAL_TABLET | ORAL | 0 refills | Status: DC | PRN

## 2018-06-09 NOTE — Progress Notes (Signed)
[place] OFFICE PROGRESS NOTE   Diagnosis: Colon cancer  INTERVAL HISTORY:    Ms. Freehling returns as scheduled.  She reports increased pain at the rectum.  She has rectal urgency.  The pain is not relieved with tramadol.  Bleeding has improved.  She otherwise feels well.  Objective:  Vital signs in last 24 hours:  Blood pressure (!) 157/75, pulse 60, temperature 97.6 F (36.4 C), temperature source Oral, resp. rate 18, height 5' 7" (1.702 m), weight 164 lb (74.4 kg), SpO2 100 %.    HEENT: Neck without mass Lymphatics: No cervical, supraclavicular, axillary, or inguinal nodes Resp: Lungs clear bilaterally Cardio: Regular rate and rhythm GI: No hepatomegaly, no mass, nontender Vascular: No leg edema   Lab Results:  Lab Results  Component Value Date   WBC 3.7 (L) 06/07/2018   HGB 13.0 06/07/2018   HCT 40.4 06/07/2018   MCV 79.1 (L) 06/07/2018   PLT 240 06/07/2018   NEUTROABS 2.5 06/07/2018    CMP  Lab Results  Component Value Date   NA 141 06/07/2018   K 4.8 06/07/2018   CL 105 06/07/2018   CO2 25 06/07/2018   GLUCOSE 106 (H) 06/07/2018   BUN 10 06/07/2018   CREATININE 1.03 (H) 06/07/2018   CALCIUM 10.0 06/07/2018   PROT 8.0 06/07/2018   ALBUMIN 4.3 06/07/2018   AST 27 06/07/2018   ALT 12 06/07/2018   ALKPHOS 111 06/07/2018   BILITOT 0.5 06/07/2018   GFRNONAA 52 (L) 06/07/2018   GFRAA >60 06/07/2018    Lab Results  Component Value Date   CEA1 15.00 (H) 06/07/2018    Lab Results  Component Value Date   INR 1.07 05/17/2018    Imaging:  Ct Chest W Contrast  Result Date: 06/07/2018 CLINICAL DATA:  Restaging metastatic colon cancer EXAM: CT CHEST, ABDOMEN, AND PELVIS WITH CONTRAST TECHNIQUE: Multidetector CT imaging of the chest, abdomen and pelvis was performed following the standard protocol during bolus administration of intravenous contrast. CONTRAST:  39m OMNIPAQUE IOHEXOL 300 MG/ML  SOLN COMPARISON:  12/14/2017 FINDINGS: CT  CHEST FINDINGS Cardiovascular: The heart is normal in size and stable. No pericardial effusion. The aorta is normal in caliber. No dissection. The branch vessels are patent. Mediastinum/Nodes: Small scattered mediastinal and hilar lymph nodes, some of which are calcified. No mass or adenopathy. The esophagus is grossly normal. Lungs/Pleura: No acute pulmonary findings. No worrisome pulmonary nodules to suggest metastatic disease. No pleural effusion or pleural nodules. Musculoskeletal: No breast masses, supraclavicular or axillary adenopathy. The thyroid gland appears normal. The bony structures are unremarkable. No findings for metastatic bone disease. Scattered hemangiomas are noted. CT ABDOMEN PELVIS FINDINGS Hepatobiliary: Stable mild cirrhotic changes. Stable are ablation sites involving the left and right hepatic lobes. No CT findings to suggest residual or recurrent tumor in these areas. No new metastatic lesions are identified. The gallbladder is normal. No common bile duct dilatation. Pancreas: No mass, inflammation or ductal dilatation. Spleen: Stable small low-attenuation lesion near the splenic hilum, likely a benign cyst or hemangioma. Adrenals/Urinary Tract: Stable left medial limb adrenal gland lesion, likely benign adenoma. 2.7 cm solid enhancing right upper pole renal mass. No significant change since prior study. The left kidney is unremarkable and stable. The bladder is unremarkable. Stomach/Bowel: Stable stable surgical changes involving the sigmoid colon from previous colon cancer resection. No findings suspicious for recurrent tumor. No colonic inflammatory changes or obstructive findings. The stomach, duodenum and small bowel are unremarkable and stable. Vascular/Lymphatic: Normal  aorta and branch vessels. The major venous structures are patent. Small scattered mesenteric and retroperitoneal lymph nodes are stable. Persistent low-attenuation periportal adenopathy. I do not see any significant  change. The 12 mm node between the portal vein and the IVC is stable and the nodes just above the portal vein also appears stable the largest measuring 7.5 mm. On the prior study there was a small soft tissue nodule adjacent to the descending colon in the left pericolic region. This measured 10 x 8 mm and now measures 18 x 15 mm and is worrisome for a metastatic soft tissue implant/peroneal metastasis. There is also a 6 mm nodule or lymph node in the sigmoid mesocolon on image number 101 which is worrisome for a implant. Small nodes along the root of the small bowel mesentery appear stable. No omental lesions are identified. Reproductive: Surgically absent. Other: No pelvic mass or free pelvic fluid collections. No inguinal adenopathy. Musculoskeletal: No significant bony findings. IMPRESSION: 1. Stable appearing liver ablation sites. No findings suspicious for recurrent tumor and no findings for new metastatic hepatic disease. 2. Enlarging peritoneal soft tissue lesion in the left pericolic gutter area worrisome for a progressive tumor implant. 3. Small nodule in the sigmoid mesocolon appears stable and there also several small nodules in the root of the small bowel mesentery which appears stable. No new omental lesions. 4. Stable prominent periportal lymph nodes. 5. No findings for metastatic disease involving the chest. Electronically Signed   By: Marijo Sanes M.D.   On: 06/07/2018 13:43   Ct Abdomen Pelvis W Contrast  Result Date: 06/07/2018 CLINICAL DATA:  Restaging metastatic colon cancer EXAM: CT CHEST, ABDOMEN, AND PELVIS WITH CONTRAST TECHNIQUE: Multidetector CT imaging of the chest, abdomen and pelvis was performed following the standard protocol during bolus administration of intravenous contrast. CONTRAST:  18m OMNIPAQUE IOHEXOL 300 MG/ML  SOLN COMPARISON:  12/14/2017 FINDINGS: CT CHEST FINDINGS Cardiovascular: The heart is normal in size and stable. No pericardial effusion. The aorta is normal in  caliber. No dissection. The branch vessels are patent. Mediastinum/Nodes: Small scattered mediastinal and hilar lymph nodes, some of which are calcified. No mass or adenopathy. The esophagus is grossly normal. Lungs/Pleura: No acute pulmonary findings. No worrisome pulmonary nodules to suggest metastatic disease. No pleural effusion or pleural nodules. Musculoskeletal: No breast masses, supraclavicular or axillary adenopathy. The thyroid gland appears normal. The bony structures are unremarkable. No findings for metastatic bone disease. Scattered hemangiomas are noted. CT ABDOMEN PELVIS FINDINGS Hepatobiliary: Stable mild cirrhotic changes. Stable are ablation sites involving the left and right hepatic lobes. No CT findings to suggest residual or recurrent tumor in these areas. No new metastatic lesions are identified. The gallbladder is normal. No common bile duct dilatation. Pancreas: No mass, inflammation or ductal dilatation. Spleen: Stable small low-attenuation lesion near the splenic hilum, likely a benign cyst or hemangioma. Adrenals/Urinary Tract: Stable left medial limb adrenal gland lesion, likely benign adenoma. 2.7 cm solid enhancing right upper pole renal mass. No significant change since prior study. The left kidney is unremarkable and stable. The bladder is unremarkable. Stomach/Bowel: Stable stable surgical changes involving the sigmoid colon from previous colon cancer resection. No findings suspicious for recurrent tumor. No colonic inflammatory changes or obstructive findings. The stomach, duodenum and small bowel are unremarkable and stable. Vascular/Lymphatic: Normal aorta and branch vessels. The major venous structures are patent. Small scattered mesenteric and retroperitoneal lymph nodes are stable. Persistent low-attenuation periportal adenopathy. I do not see any significant change. The 12  mm node between the portal vein and the IVC is stable and the nodes just above the portal vein also  appears stable the largest measuring 7.5 mm. On the prior study there was a small soft tissue nodule adjacent to the descending colon in the left pericolic region. This measured 10 x 8 mm and now measures 18 x 15 mm and is worrisome for a metastatic soft tissue implant/peroneal metastasis. There is also a 6 mm nodule or lymph node in the sigmoid mesocolon on image number 101 which is worrisome for a implant. Small nodes along the root of the small bowel mesentery appear stable. No omental lesions are identified. Reproductive: Surgically absent. Other: No pelvic mass or free pelvic fluid collections. No inguinal adenopathy. Musculoskeletal: No significant bony findings. IMPRESSION: 1. Stable appearing liver ablation sites. No findings suspicious for recurrent tumor and no findings for new metastatic hepatic disease. 2. Enlarging peritoneal soft tissue lesion in the left pericolic gutter area worrisome for a progressive tumor implant. 3. Small nodule in the sigmoid mesocolon appears stable and there also several small nodules in the root of the small bowel mesentery which appears stable. No new omental lesions. 4. Stable prominent periportal lymph nodes. 5. No findings for metastatic disease involving the chest. Electronically Signed   By: Marijo Sanes M.D.   On: 06/07/2018 13:43    Medications: I have reviewed the patient's current medications.   Assessment/Plan: 1. Adenocarcinoma the distal sigmoid colon, poorly differentiated, stage II (T3 N0), status post a laparoscopic assisted sigmoid colectomy 08/09/2015  No loss of mismatch repair protein expression  Elevated preoperative CEA  Staging CT of the abdomen and pelvis 06/22/2015 with no evidence of distant metastatic disease  Mildly elevated CEA 02/23/2017And 04/17/2016  CTs chest, abdomen, and pelvis on 06/04/2016, 40m left upper lobe nodule-no comparison available, new hypodense lesion in the left liver, solid Right renal mass  MRI  06/10/2016-2 enhancing lesions in the liver consistent with metastatic disease, segment 2 and segment 5. Solid enhancing right renal lesion consistent with a renal cell carcinoma  Radiofrequency ablation of 2 liver lesions on 07/25/2016  Cycle 1 adjuvant Xeloda 08/18/2016  Cycle 2 Xeloda 09/08/2016  CEA improved 09/26/2016  Cycle 3 Xeloda held 09/29/2016 due to unexplained abdominal pain,referred for CT  CT abdomen/pelvis 09/30/2016 with a long segment of bowel wall thickening involving the distal ileum; lesion left hepatic lobe similar to slightly smaller following ablation; lesion posterior right hepatic lobe elongated favored to represent treatment tract.  Cycle 3 Xeloda 10/10/2016 (dose reduced due to drug induced enteritis following cycle 2)discontinued 10/15/2016 secondary to abdominal pain.  CT in while the emergency room with abdominal pain 10/15/2016 revealed descending colitis  Colonoscopy 10/30/2016 confirmed a mass at the: colo-colonicanastomosis  Cycle 4 Xeloda 11/03/2016  01/09/2017 status post a low anterior resection for removal of locally recurrent tumor at the sigmoid anastomosis, resection margins negative, tumor invades pericolonic soft tissue, 12 benign lymph nodes;MSI stabl  Cycle 5 Xeloda 02/12/2017  Cycle 6 Xeloda 03/12/2017, Xeloda dose reduced to 1000 mg twice daily on 03/18/2017  Cycle 7 Xeloda 04/09/2017  Cycle 8 Xeloda 05/12/2017  Restaging CTs 06/24/2017-no evidence of metastatic disease involving the chest. Post ablation changes in the liver. No new hepatic metastatic disease.  Restaging CTs 12/14/2017- stable liver ablation sites, indeterminate nodule adjacent to the descending colon, stable left renal mass, changes of early cirrhosis, enlarged porta hepatis node-likely reactive  MRI abdomen 04/22/2018- enlargement of 1.7 cm left paracolic gutter mass, stable  1 cm extrahepatic left liver capsule lesion, no evidence of local tumor recurrence at the  ablation sites, no new liver lesions, stable right kidney mass  Colonoscopy 05/19/2018- mass in the rectum at the anastomotic site beginning at 3 cm from the anal verge, biopsy confirmed invasive adenocarcinoma  CT 06/07/2018-stable liver ablation sites, enlarging peritoneal lesion at the left pericolic gutter, stable nodule in the sigmoid mesocolon, stable prominent periportal lymph nodes, no evidence of metastatic disease to the chest   2.History ofMicrocytic anemia-likely iron deficiency anemia secondary to colon cancer, persistent microcytic anemia;ferrous sulfate increased to twice daily 12/15/2017;hemoglobin in normal range 03/16/2018, persistent microcytosis  3.Tubular villous adenoma on the sigmoid colon resection specimen 08/09/2015  Multiple tubular adenomas removed on a colonoscopy 11/16/2015  4. Right renal mass on CT 06/04/2016-suspicious for renal cell carcinoma;stable on CT 06/24/2017;referred to urology  5. History ofneutropenia secondary to chemotherapy  6.   Pain secondary to local recurrence of colon cancer   Disposition: Ms. Hepp has been diagnosed with recurrent colon cancer.  There is local tumor recurrence in the rectum and CT evidence of carcinomatosis.  Her case was presented at the GI tumor conference today.  Treatment options include an attempt at surgical resection of all measurable disease, palliative radiation, and systemic chemotherapy.  She understands surgery will require a permanent colostomy and would most likely not be curative. I discussed treatment options with Ms.Mormino and her daughter.  She does not wish to consider surgery.  We decided to make a referral to Dr. Lisbeth Renshaw to consider palliative radiation to the rectum.  We we will then consider systemic chemotherapy options.  I recommend capecitabine to be given concurrent with radiation.  We reviewed potential toxicities associated with capecitabine.  She agrees to proceed.  I discussed the  case with Dr. Lisbeth Renshaw.  The plan is to begin concurrent chemotherapy and radiation on 06/21/2018.  She will return for an office visit after approximately 2 weeks of capecitabine and radiation.  40 minutes were spent with the patient today.  The majority of the time was used for counseling and coordination of care.  [name], MD  06/09/2018  2:23 PM

## 2018-06-10 ENCOUNTER — Other Ambulatory Visit: Payer: Self-pay

## 2018-06-10 ENCOUNTER — Telehealth: Payer: Self-pay | Admitting: Pharmacist

## 2018-06-10 ENCOUNTER — Encounter: Payer: Self-pay | Admitting: Radiation Oncology

## 2018-06-10 ENCOUNTER — Telehealth: Payer: Self-pay | Admitting: Oncology

## 2018-06-10 ENCOUNTER — Ambulatory Visit
Admission: RE | Admit: 2018-06-10 | Discharge: 2018-06-10 | Disposition: A | Discharge status: Home / Self Care | Payer: Medicare Other | Source: Ambulatory Visit | Attending: Radiation Oncology | Admitting: Radiation Oncology

## 2018-06-10 VITALS — BP 128/78 | HR 69 | Temp 97.7°F | Resp 20 | Ht 67.0 in | Wt 165.0 lb

## 2018-06-10 DIAGNOSIS — Z79899 Other long term (current) drug therapy: Secondary | ICD-10-CM | POA: Diagnosis not present

## 2018-06-10 DIAGNOSIS — Z79890 Hormone replacement therapy (postmenopausal): Secondary | ICD-10-CM | POA: Diagnosis not present

## 2018-06-10 DIAGNOSIS — C187 Malignant neoplasm of sigmoid colon: Secondary | ICD-10-CM | POA: Insufficient documentation

## 2018-06-10 DIAGNOSIS — Z7982 Long term (current) use of aspirin: Secondary | ICD-10-CM | POA: Diagnosis not present

## 2018-06-10 DIAGNOSIS — C189 Malignant neoplasm of colon, unspecified: Secondary | ICD-10-CM

## 2018-06-10 DIAGNOSIS — Z510 Encounter for antineoplastic radiation therapy: Secondary | ICD-10-CM | POA: Insufficient documentation

## 2018-06-10 DIAGNOSIS — K6389 Other specified diseases of intestine: Secondary | ICD-10-CM | POA: Diagnosis not present

## 2018-06-10 DIAGNOSIS — Z85528 Personal history of other malignant neoplasm of kidney: Secondary | ICD-10-CM | POA: Insufficient documentation

## 2018-06-10 DIAGNOSIS — Z886 Allergy status to analgesic agent status: Secondary | ICD-10-CM | POA: Insufficient documentation

## 2018-06-10 DIAGNOSIS — Z800 Family history of malignant neoplasm of digestive organs: Secondary | ICD-10-CM | POA: Diagnosis not present

## 2018-06-10 DIAGNOSIS — E039 Hypothyroidism, unspecified: Secondary | ICD-10-CM | POA: Insufficient documentation

## 2018-06-10 DIAGNOSIS — C787 Secondary malignant neoplasm of liver and intrahepatic bile duct: Principal | ICD-10-CM

## 2018-06-10 DIAGNOSIS — Z803 Family history of malignant neoplasm of breast: Secondary | ICD-10-CM | POA: Diagnosis not present

## 2018-06-10 DIAGNOSIS — C20 Malignant neoplasm of rectum: Secondary | ICD-10-CM | POA: Diagnosis not present

## 2018-06-10 DIAGNOSIS — Z885 Allergy status to narcotic agent status: Secondary | ICD-10-CM | POA: Insufficient documentation

## 2018-06-10 MED ORDER — CAPECITABINE 500 MG PO TABS
ORAL_TABLET | ORAL | 0 refills | Status: DC
Start: 2018-06-10 — End: 2018-06-10

## 2018-06-10 MED ORDER — CAPECITABINE 500 MG PO TABS: ORAL_TABLET | ORAL | 0 refills | Status: DC

## 2018-06-10 MED FILL — CAPECITABINE 500 MG TABLET: 500 | 22 days supply | Qty: 100 | Fill #0

## 2018-06-10 NOTE — Progress Notes (Signed)
  Radiation Oncology         ([phone]) [phone] ________________________________  Name: Nichole Cross MRN: [id number]  Date: 06/10/2018  DOB: 1941/03/18  Optical Surface Tracking Plan:  Since intensity modulated radiotherapy (IMRT) and 3D conformal radiation treatment methods are predicated on accurate and precise positioning for treatment, intrafraction motion monitoring is medically necessary to ensure accurate and safe treatment delivery.  The ability to quantify intrafraction motion without excessive ionizing radiation dose can only be performed with optical surface tracking. Accordingly, surface imaging offers the opportunity to obtain 3D measurements of patient position throughout IMRT and 3D treatments without excessive radiation exposure.  I am ordering optical surface tracking for this patient's upcoming course of radiotherapy. ________________________________  Kyung Rudd, MD 06/10/2018 7:34 PM    Reference:   Ursula Alert, J, et al. Surface imaging-based analysis of intrafraction motion for breast radiotherapy patients.Journal of [place], n. 6, nov. 2014. ISSN [id number].   Available at: <http://www.jacmp.org/index.php/jacmp/article/view/4957>.

## 2018-06-10 NOTE — Addendum Note (Signed)
Encounter addended by: Hayden Pedro, PA-C on: 06/10/2018 9:44 AM  Actions taken: Sign clinical note

## 2018-06-10 NOTE — Telephone Encounter (Signed)
Scheduled appt per 7/24 los - pt is aware of appt added 8/23 and /24 - lisa on [facility] scheduled appt on next available .

## 2018-06-10 NOTE — Telephone Encounter (Signed)
Oral Chemotherapy Pharmacist Encounter   I spoke with patient for overview of: Xeloda.   Counseled patient on administration, dosing, side effects, monitoring, drug-food interactions, safe handling, storage, and disposal.  Patient will take Xeloda 500mg  tablets, 3 tablets (1500mg ) by mouth in AM and 3 tabs (1500mg ) by mouth in PM, within 30 minutes of finishing meals, on days of radiation only.  Xeloda and radiation start date: 06/21/2018  Adverse effects of Xeloda include but are not limited to: fatigue, decreased blood counts, GI upset, diarrhea, and hand-foot syndrome.  Patient has anti-emetic on hand and knows to take it if nausea develops.   Patient will obtain anti diarrheal and alert the office of 4 or more loose stools above baseline.   Reviewed with patient importance of keeping a medication schedule and plan for any missed doses.  Nichole Cross voiced understanding and appreciation.   All questions answered.  Medication reconciliation performed and medication/allergy list updated.  Patient states she still has 80 remaining Xeloda tablets from previous course. Remianing 100 tablets needed to completed #180 (3 tabs BID x 30 treatment days = 180) will be made ready for pick-up from the [facility] for $101.56 Patient's daughter will pick-up Xeloda supply tomorrow (06/11/2018) so that patient has them on hand prior to radiation start.  Patient knows to call the office with questions or concerns. Oral Oncology Clinic will continue to follow.  Thank you,  Nichole Cross, PharmD, BCPS, BCOP  06/10/2018  3:05 PM Oral Oncology Clinic [phone]

## 2018-06-10 NOTE — Progress Notes (Addendum)
Radiation Oncology         (336) [phone] ________________________________  Name: Nichole Cross        MRN: [id number]  Date of Service: 06/10/2018 DOB: [birth year]  NL:[id number], Claudina Lick, MD  Ladell Pier, MD     REFERRING PHYSICIAN: Ladell Pier, MD   DIAGNOSIS: The primary encounter diagnosis was Adenocarcinoma of colon metastatic to liver [facility]). A diagnosis of Local recurrence of rectal cancer  [facility]) was also pertinent to this visit.   HISTORY OF PRESENT ILLNESS: Nichole Cross is a 77 y.o. female seen at the request of Dr. Malachy Mood for a history of adenocarcinoma of the distal sigmoid colon, stage II, T3N0.  She underwent laparoscopic-assisted sigmoid colectomy in September 2016.  Her CEA was normal at the time.  She was followed in surveillance, and in July 2017 she was found to have metastatic disease to the liver. She underwent radiofrequency ablation of 2 liver lesions in September 2017. She was also placed on systemic Xeloda and is continued until she was found to have colitis in December 2017.  She was treated and continued Xeloda until February 2018 where she underwent low anterior resection for locally recurrent tumor at the sigmoid anastomosis.  Her resection margins were negative, the tumor invaded the pericolonic soft tissue, and 12 nodes were benign.  She continued on with Xeloda untilJune 2019 when enlargement of a 1.7 cm left paracolic gutter mass was noted.  On 05/19/2018 she underwent colonoscopy and a mass in the rectum at the anastomotic site was noted 3 cm from the anal verge, biopsy confirmed recurrent disease. She had CT imaging on 06/07/18 that revealed a 18 x 15 mm soft tissue nodule adjacent to the descending colon in the left pericolic gutter that had previously been 10 x 8 mm. She had stability of the liver lesions, and small nodules in the sigmoid mesocolon and along the root of the small bowel mesentery. No new disease was noted.  She is met with Dr. Benay Spice to  discuss options for APR followed by systemic therapy, she is not interested in any chemotherapy at this time nor would she be willing to undergo APR.  She has seen today to discuss options of palliative radiotherapy given rectal pain and bleeding.    PREVIOUS RADIATION THERAPY: No  She has received radioactive iodine in the recent past.  PAST MEDICAL HISTORY:  Past Medical History:  Diagnosis Date  . Anemia   . Arthritis    knees  . Blood dyscrasia 2016   microcytic anemia, likely iron deficiency anemia secondary to colon cancer  . Cataracts, bilateral   . Colon cancer ([place]) 06/2015   tubular villous adenoma on sigmoid colon specimen (08/09/15), myltiple tubular adenomas removed on a colonoscopy  11/16/15  . Complication of anesthesia   . Headache(784.0)    migraines  . Hypothyroidism   . Liver metastasis ([place])   . Metastasis to kidney ([place])   . Osteoporosis   . PONV (postoperative nausea and vomiting)    NAUSEA  . Renal carcinoma ([place]) 06/2016   ? primary  . Secondary liver cancer ([place]) 06/2016   ? metastasis from colon       PAST SURGICAL HISTORY: Past Surgical History:  Procedure Laterality Date  . ABDOMINAL HYSTERECTOMY  1993   with bilateral BSO  . abdominal tumor  1993  . APPENDECTOMY    . IR GENERIC HISTORICAL  07/01/2016   IR RADIOLOGIST EVAL & MGMT 07/01/2016 Greggory Keen, MD  [facility] INTERV RAD  . IR GENERIC HISTORICAL  08/14/2016   IR RADIOLOGIST EVAL & MGMT 08/14/2016 Jacqulynn Cadet, MD [facility] INTERV RAD  . IR RADIOLOGIST EVAL & MGMT  12/24/2017  . IR RADIOLOGIST EVAL & MGMT  04/22/2018  . LAPAROSCOPIC PARTIAL COLECTOMY N/A 08/09/2015   Procedure: LAPAROSCOPIC PARTIAL COLECTOMY;  Surgeon: Jackolyn Confer, MD;  Location: [facility] ORS;  Service: General;  Laterality: N/A;  . PARTIAL KNEE ARTHROPLASTY  09/09/2012   Procedure: UNICOMPARTMENTAL KNEE;  Surgeon: Mauri Pole, MD;  Location: [facility];  Service: Orthopedics;  Laterality: Left;  . XI ROBOTIC ASSISTED LOWER  ANTERIOR RESECTION N/A 01/09/2017   Procedure: XI ROBOTIC ASSISTED LOWER ANTERIOR RESECTION;  Surgeon: Leighton Ruff, MD;  Location: [facility];  Service: General;  Laterality: N/A;     FAMILY HISTORY:  Family History  Problem Relation Age of Onset  . Pancreatic cancer Mother   . Parkinson's disease Mother   . Breast cancer Sister   . Parkinson's disease Brother   . Parkinson's disease Brother   . Colon cancer Neg Hx   . Esophageal cancer Neg Hx   . Liver cancer Neg Hx   . Rectal cancer Neg Hx   . Stomach cancer Neg Hx      SOCIAL HISTORY:  reports that she has never smoked. She has never used smokeless tobacco. She reports that she does not drink alcohol or use drugs. The patient is divorced and lives in [place].    ALLERGIES: Aspirin and Morphine and related   MEDICATIONS:  Current Outpatient Medications  Medication Sig Dispense Refill  . aspirin-acetaminophen-caffeine (EXCEDRIN MIGRAINE) 250-250-65 MG tablet Take 1 tablet by mouth every 6 (six) hours as needed for headache or migraine.    . ferrous sulfate 325 (65 FE) MG EC tablet Take 325 mg by mouth 3 (three) times daily with meals.    Marland Kitchen HYDROcodone-acetaminophen (NORCO) 5-325 MG tablet Take 1 tablet by mouth every 4 (four) hours as needed for moderate pain. 80 tablet 0  . levothyroxine (SYNTHROID, LEVOTHROID) 150 MCG tablet Take 1 tablet (150 mcg total) by mouth daily. 30 tablet 3  . loperamide (IMODIUM) 2 MG capsule Take 2 mg by mouth as needed for diarrhea or loose stools.    . polyethylene glycol (MIRALAX / GLYCOLAX) packet Take 17 g by mouth as needed.    . traMADol (ULTRAM) 50 MG tablet Take 1 tablet (50 mg total) by mouth every 6 (six) hours as needed. 60 tablet 0   Current Facility-Administered Medications  Medication Dose Route Frequency Provider Last Rate Last Dose  . 0.9 %  sodium chloride infusion  500 mL Intravenous Once Doran Stabler, MD         REVIEW OF SYSTEMS: On review of systems, the patient reports  that she is doing well overall. She does describe some pain with defecation and consistent bleeding with each bowel movement and sometimes sees clots this has increased in the last two months. She has more constipation currently. She denies any chest pain, shortness of breath, cough, fevers, chills, night sweats, unintended weight changes. She denies any bladder disturbances, and denies abdominal pain, nausea or vomiting. She denies any new musculoskeletal or joint aches or pains. A complete review of systems is obtained and is otherwise negative.     PHYSICAL EXAM:  Wt Readings from Last 3 Encounters:  06/09/18 164 lb (74.4 kg)  05/25/18 163 lb 6.4 oz (74.1 kg)  05/19/18 163 lb (73.9 kg)   Temp  Readings from Last 3 Encounters:  06/09/18 97.6 F (36.4 C) (Oral)  05/25/18 97.6 F (36.4 C) (Oral)  05/19/18 98.9 F (37.2 C)   BP Readings from Last 3 Encounters:  06/09/18 (!) 157/75  05/25/18 140/83  05/19/18 133/76   Pulse Readings from Last 3 Encounters:  06/09/18 60  05/25/18 67  05/19/18 63    In general this is a well appearing caucasian female in no acute distress. She is alert and oriented x4 and appropriate throughout the examination. HEENT reveals that the patient is normocephalic, atraumatic. EOMs are intact. Cardiopulmonary assessment is negative for acute distress and she exhibits normal effort.    ECOG = 0  0 - Asymptomatic (Fully active, able to carry on all predisease activities without restriction)  1 - Symptomatic but completely ambulatory (Restricted in physically strenuous activity but ambulatory and able to carry out work of a light or sedentary nature. For example, light housework, office work)  2 - Symptomatic, <50% in bed during the day (Ambulatory and capable of all self care but unable to carry out any work activities. Up and about more than 50% of waking hours)  3 - Symptomatic, >50% in bed, but not bedbound (Capable of only limited self-care, confined to  bed or chair 50% or more of waking hours)  4 - Bedbound (Completely disabled. Cannot carry on any self-care. Totally confined to bed or chair)  5 - Death   Eustace Pen MM, Creech RH, Tormey DC, et al. [phone]). "Toxicity and response criteria of the [facility] Group". [place] Oncol. 5 (6): 649-55    LABORATORY DATA:  Lab Results  Component Value Date   WBC 3.7 (L) 06/07/2018   HGB 13.0 06/07/2018   HCT 40.4 06/07/2018   MCV 79.1 (L) 06/07/2018   PLT 240 06/07/2018   Lab Results  Component Value Date   NA 141 06/07/2018   K 4.8 06/07/2018   CL 105 06/07/2018   CO2 25 06/07/2018   Lab Results  Component Value Date   ALT 12 06/07/2018   AST 27 06/07/2018   ALKPHOS 111 06/07/2018   BILITOT 0.5 06/07/2018      RADIOGRAPHY: Ct Chest W Contrast  Result Date: 06/07/2018 CLINICAL DATA:  Restaging metastatic colon cancer EXAM: CT CHEST, ABDOMEN, AND PELVIS WITH CONTRAST TECHNIQUE: Multidetector CT imaging of the chest, abdomen and pelvis was performed following the standard protocol during bolus administration of intravenous contrast. CONTRAST:  67m OMNIPAQUE IOHEXOL 300 MG/ML  SOLN COMPARISON:  12/14/2017 FINDINGS: CT CHEST FINDINGS Cardiovascular: The heart is normal in size and stable. No pericardial effusion. The aorta is normal in caliber. No dissection. The branch vessels are patent. Mediastinum/Nodes: Small scattered mediastinal and hilar lymph nodes, some of which are calcified. No mass or adenopathy. The esophagus is grossly normal. Lungs/Pleura: No acute pulmonary findings. No worrisome pulmonary nodules to suggest metastatic disease. No pleural effusion or pleural nodules. Musculoskeletal: No breast masses, supraclavicular or axillary adenopathy. The thyroid gland appears normal. The bony structures are unremarkable. No findings for metastatic bone disease. Scattered hemangiomas are noted. CT ABDOMEN PELVIS FINDINGS Hepatobiliary: Stable mild cirrhotic changes.  Stable are ablation sites involving the left and right hepatic lobes. No CT findings to suggest residual or recurrent tumor in these areas. No new metastatic lesions are identified. The gallbladder is normal. No common bile duct dilatation. Pancreas: No mass, inflammation or ductal dilatation. Spleen: Stable small low-attenuation lesion near the splenic hilum, likely a benign cyst or hemangioma. Adrenals/Urinary  Tract: Stable left medial limb adrenal gland lesion, likely benign adenoma. 2.7 cm solid enhancing right upper pole renal mass. No significant change since prior study. The left kidney is unremarkable and stable. The bladder is unremarkable. Stomach/Bowel: Stable stable surgical changes involving the sigmoid colon from previous colon cancer resection. No findings suspicious for recurrent tumor. No colonic inflammatory changes or obstructive findings. The stomach, duodenum and small bowel are unremarkable and stable. Vascular/Lymphatic: Normal aorta and branch vessels. The major venous structures are patent. Small scattered mesenteric and retroperitoneal lymph nodes are stable. Persistent low-attenuation periportal adenopathy. I do not see any significant change. The 12 mm node between the portal vein and the IVC is stable and the nodes just above the portal vein also appears stable the largest measuring 7.5 mm. On the prior study there was a small soft tissue nodule adjacent to the descending colon in the left pericolic region. This measured 10 x 8 mm and now measures 18 x 15 mm and is worrisome for a metastatic soft tissue implant/peroneal metastasis. There is also a 6 mm nodule or lymph node in the sigmoid mesocolon on image number 101 which is worrisome for a implant. Small nodes along the root of the small bowel mesentery appear stable. No omental lesions are identified. Reproductive: Surgically absent. Other: No pelvic mass or free pelvic fluid collections. No inguinal adenopathy. Musculoskeletal: No  significant bony findings. IMPRESSION: 1. Stable appearing liver ablation sites. No findings suspicious for recurrent tumor and no findings for new metastatic hepatic disease. 2. Enlarging peritoneal soft tissue lesion in the left pericolic gutter area worrisome for a progressive tumor implant. 3. Small nodule in the sigmoid mesocolon appears stable and there also several small nodules in the root of the small bowel mesentery which appears stable. No new omental lesions. 4. Stable prominent periportal lymph nodes. 5. No findings for metastatic disease involving the chest. Electronically Signed   By: Marijo Sanes M.D.   On: 06/07/2018 13:43   Ct Abdomen Pelvis W Contrast  Result Date: 06/07/2018 CLINICAL DATA:  Restaging metastatic colon cancer EXAM: CT CHEST, ABDOMEN, AND PELVIS WITH CONTRAST TECHNIQUE: Multidetector CT imaging of the chest, abdomen and pelvis was performed following the standard protocol during bolus administration of intravenous contrast. CONTRAST:  69m OMNIPAQUE IOHEXOL 300 MG/ML  SOLN COMPARISON:  12/14/2017 FINDINGS: CT CHEST FINDINGS Cardiovascular: The heart is normal in size and stable. No pericardial effusion. The aorta is normal in caliber. No dissection. The branch vessels are patent. Mediastinum/Nodes: Small scattered mediastinal and hilar lymph nodes, some of which are calcified. No mass or adenopathy. The esophagus is grossly normal. Lungs/Pleura: No acute pulmonary findings. No worrisome pulmonary nodules to suggest metastatic disease. No pleural effusion or pleural nodules. Musculoskeletal: No breast masses, supraclavicular or axillary adenopathy. The thyroid gland appears normal. The bony structures are unremarkable. No findings for metastatic bone disease. Scattered hemangiomas are noted. CT ABDOMEN PELVIS FINDINGS Hepatobiliary: Stable mild cirrhotic changes. Stable are ablation sites involving the left and right hepatic lobes. No CT findings to suggest residual or recurrent  tumor in these areas. No new metastatic lesions are identified. The gallbladder is normal. No common bile duct dilatation. Pancreas: No mass, inflammation or ductal dilatation. Spleen: Stable small low-attenuation lesion near the splenic hilum, likely a benign cyst or hemangioma. Adrenals/Urinary Tract: Stable left medial limb adrenal gland lesion, likely benign adenoma. 2.7 cm solid enhancing right upper pole renal mass. No significant change since prior study. The left kidney is unremarkable and  stable. The bladder is unremarkable. Stomach/Bowel: Stable stable surgical changes involving the sigmoid colon from previous colon cancer resection. No findings suspicious for recurrent tumor. No colonic inflammatory changes or obstructive findings. The stomach, duodenum and small bowel are unremarkable and stable. Vascular/Lymphatic: Normal aorta and branch vessels. The major venous structures are patent. Small scattered mesenteric and retroperitoneal lymph nodes are stable. Persistent low-attenuation periportal adenopathy. I do not see any significant change. The 12 mm node between the portal vein and the IVC is stable and the nodes just above the portal vein also appears stable the largest measuring 7.5 mm. On the prior study there was a small soft tissue nodule adjacent to the descending colon in the left pericolic region. This measured 10 x 8 mm and now measures 18 x 15 mm and is worrisome for a metastatic soft tissue implant/peroneal metastasis. There is also a 6 mm nodule or lymph node in the sigmoid mesocolon on image number 101 which is worrisome for a implant. Small nodes along the root of the small bowel mesentery appear stable. No omental lesions are identified. Reproductive: Surgically absent. Other: No pelvic mass or free pelvic fluid collections. No inguinal adenopathy. Musculoskeletal: No significant bony findings. IMPRESSION: 1. Stable appearing liver ablation sites. No findings suspicious for recurrent  tumor and no findings for new metastatic hepatic disease. 2. Enlarging peritoneal soft tissue lesion in the left pericolic gutter area worrisome for a progressive tumor implant. 3. Small nodule in the sigmoid mesocolon appears stable and there also several small nodules in the root of the small bowel mesentery which appears stable. No new omental lesions. 4. Stable prominent periportal lymph nodes. 5. No findings for metastatic disease involving the chest. Electronically Signed   By: Marijo Sanes M.D.   On: 06/07/2018 13:43       IMPRESSION/PLAN: 1. Recurrent metastatic Stage II adenocarcinoma of the rectum with rectal pain and bleeding. Dr. Lisbeth Renshaw discusses the pathology findings and reviews the nature of recurrent rectal disease. The consensus from the GI conference included APR followed by systemic therapy versus locally palliating her symptoms with concurrent chemoradiotherapy. We discussed the risks, benefits, short, and long term effects of radiotherapy, and the patient is interested in proceeding. Dr. Lisbeth Renshaw discusses the delivery and logistics of radiotherapy and anticipates a course of 5 weeks of radiotherapy. Written consent is obtained and placed in the chart, a copy was provided to the patient.  2. Left pericoloic gutter nodule. We will follow up with her post treatment scans to determine if this has responded to the systemic portion of her treatment, and if this remains, we could consider SBRT style therapy to this site. She is in agreement with this plan.  In a visit lasting 45 minutes, greater than 50% of the time was spent face to face discussing her case, and coordinating the patient's care.   The above documentation reflects my direct findings during this shared patient visit. Please see the separate note by Dr. Lisbeth Renshaw on this date for the remainder of the patient's plan of care.    Carola Rhine, PAC

## 2018-06-10 NOTE — Telephone Encounter (Signed)
Oral Oncology Pharmacist Encounter  Received new prescription for Xeloda (capecitabine) for the treatment of recurrent colorectal cancer in conjunction with radiation, planned duration 5-6 weeks of therapy. Patient with extensive previous use of Xeloda with some intermittent loose stools and eventual blood count decrease during adjuvant therapy  Labs from 06/07/2018 assessed, OK for treatment. SCr=1.03, est Crcl ~ 50 mL/min Xeloda dosed at ~ 800 mg/m2 with concurrent radiation  Current medication list in Epic reviewed, no DDIs with Xeloda identified.  Prescription has been e-scribed to the [facility] for benefits analysis and approval.  Oral Oncology Clinic will continue to follow for insurance authorization, copayment issues, initial counseling and start date.  Johny Drilling, PharmD, BCPS, BCOP  06/10/2018 8:10 AM Oral Oncology Clinic [phone]

## 2018-06-10 NOTE — Progress Notes (Signed)
GI Location of Tumor / Histology:  1. Adenocarcinoma the distal sigmoid colon, poorly differentiated, stage II (T3 N0), status post a laparoscopic assisted sigmoid colectomy 08/09/2015    Nichole Cross presented with symptoms of: Increased rectal pain and urgency.   Past/Anticipated interventions by surgeon, if any:  sigmoid colectomy 08/09/2015   Past/Anticipated interventions by medical oncology, if any:  Dr. Benay Spice 06/09/18 Disposition: [name] has been diagnosed with recurrent colon cancer.  There is local tumor recurrence in the rectum and CT evidence of carcinomatosis.  Her case was presented at the GI tumor conference today.  Treatment options include an attempt at surgical resection of all measurable disease, palliative radiation, and systemic chemotherapy.  She understands surgery will require a permanent colostomy and would most likely not be curative. I discussed treatment options with [name] and her daughter.  She does not wish to consider surgery.  We decided to make a referral to Dr. Lisbeth Renshaw to consider palliative radiation to the rectum.  We we will then consider systemic chemotherapy options.  I recommend capecitabine to be given concurrent with radiation.  We reviewed potential toxicities associated with capecitabine.  She agrees to proceed.  I discussed the case with Dr. Lisbeth Renshaw.  The plan is to begin concurrent chemotherapy and radiation on 06/21/2018.  She will return for an office visit after approximately 2 weeks of capecitabine and radiation.   Weight changes, if any: She denies.   Bowel/Bladder complaints, if any: Bowel urgency. She denies bladder difficulties.   Nausea / Vomiting, if any: She denies.   Pain issues, if any:  She reports reports increased rectal pain and pressure when standing or walking. She was prescribed hydrocodone yesterday and took one pill last night.   Any blood per rectum: Yes, it is present in her stool when she has a bowel movement.    SAFETY ISSUES:  Prior radiation? She had iodine treatment to her thyroid remotely.   Pacemaker/ICD? No  Possible current pregnancy? No  Is the patient on methotrexate? No   Current Complaints/Details:  BP 128/78   Pulse 69   Temp 97.7 F (36.5 C)   Resp 20   Ht 5\' 7"  (1.702 m)   Wt 165 lb (74.8 kg)   SpO2 98% Comment: room air  BMI 25.84 kg/m    Wt Readings from Last 3 Encounters:  06/10/18 165 lb (74.8 kg)  06/09/18 164 lb (74.4 kg)  05/25/18 163 lb 6.4 oz (74.1 kg)

## 2018-06-10 NOTE — Progress Notes (Signed)
  Radiation Oncology         (336) [phone] ________________________________  Name: Nichole Cross MRN: [id number]  Date: 06/10/2018  DOB: 20-Feb-1941    SIMULATION AND TREATMENT PLANNING NOTE  DIAGNOSIS:     ICD-10-CM   1. Local recurrence of rectal cancer  ([place]) C20      The patient presented for simulation for the patient's upcoming course of radiation for the diagnosis of rectal cancer. The patient was placed in a supine position. A customized vac-lock bag was constructed to aid in patient immobilization on. This complex treatment device will be used on a daily basis during the treatment. In this fashion a CT scan was obtained through the pelvic region and the isocenter was placed near midline within the pelvis. Surface markings were placed.  The patient's imaging was loaded into the radiation treatment planning system. The patient will initially be planned to receive a course of radiation to a dose of 45 Gy. This will be accomplished in 25 fractions at 1.8 gray per fraction. This initial treatment will correspond to a 3-D conformal technique. The target volume has been contoured in addition to the rectum, bladder and femoral heads. Dose volume histograms of each of these structures have been requested and these will be carefully reviewed as part of the 3-D conformal treatment planning process. To accomplish this initial treatment, 4 customized blocks have been designed for this purpose. Each of these 4 complex treatment devices will be used on a daily basis during the initial course of the treatment. It is anticipated that the patient will then receive a boost for an additional 9 Gy. The anticipated total dose therefore will be 54 Gy.    Special treatment procedure The patient will receive chemotherapy during the course of radiation treatment. The patient may experience increased or overlapping toxicity due to this combined-modality approach and the patient will be monitored for such  problems. This may include extra lab work as necessary. This therefore constitutes a special treatment procedure.    ________________________________  Jodelle Gross, MD, PhD

## 2018-06-11 ENCOUNTER — Encounter: Payer: Self-pay | Admitting: General Practice

## 2018-06-11 NOTE — Progress Notes (Signed)
[place] Psychosocial Distress Screening Clinical Social Work  Clinical Social Work was referred by distress screening protocol.  The patient scored a 6 on the Psychosocial Distress Thermometer which indicates moderate distress. Clinical Social Worker contacted patient by phone to assess for distress and other psychosocial needs. Patient unable to talk due to being at work.  CSW briefly reviewed availability of [place] and will mail information packet at patient request.    [facility] DISTRESS SCREENING 06/10/2018  Screening Type Initial Screening  Distress experienced in past week (1-10) 6  Practical problem type Insurance  Emotional problem type Nervousness/Anxiety  Physical Problem type Pain;Sleep/insomnia;Constipation/diarrhea;Skin dry/itchy    Clinical Social Worker follow up needed: No.  If yes, follow up plan:  Beverely Pace, [place], LCSW Clinical Social Worker Phone:  [phone]

## 2018-06-14 ENCOUNTER — Telehealth: Payer: Self-pay | Admitting: Oncology

## 2018-06-14 NOTE — Telephone Encounter (Signed)
Oral Oncology Patient Advocate Encounter  Xeloda was picked up from [place] on 06-11-18  [place] Patient [place] Phone [phone] Fax [phone]

## 2018-06-14 NOTE — Telephone Encounter (Signed)
Scheduled appt per 7/25 sch message- pt is aware of appt date and time.

## 2018-06-15 ENCOUNTER — Ambulatory Visit (HOSPITAL_COMMUNITY): Payer: Medicare Other

## 2018-06-15 ENCOUNTER — Other Ambulatory Visit: Payer: Medicare Other

## 2018-06-16 DIAGNOSIS — C20 Malignant neoplasm of rectum: Secondary | ICD-10-CM | POA: Diagnosis not present

## 2018-06-17 ENCOUNTER — Ambulatory Visit: Payer: Medicare Other | Admitting: Oncology

## 2018-06-21 ENCOUNTER — Ambulatory Visit
Admission: RE | Admit: 2018-06-21 | Discharge: 2018-06-21 | Disposition: A | Discharge status: Home / Self Care | Payer: Medicare Other | Source: Ambulatory Visit | Attending: Radiation Oncology | Admitting: Radiation Oncology

## 2018-06-21 DIAGNOSIS — Z510 Encounter for antineoplastic radiation therapy: Secondary | ICD-10-CM | POA: Insufficient documentation

## 2018-06-21 DIAGNOSIS — C20 Malignant neoplasm of rectum: Secondary | ICD-10-CM | POA: Insufficient documentation

## 2018-06-22 ENCOUNTER — Ambulatory Visit
Admission: RE | Admit: 2018-06-22 | Discharge: 2018-06-22 | Disposition: A | Discharge status: Home / Self Care | Payer: Medicare Other | Source: Ambulatory Visit | Attending: Radiation Oncology | Admitting: Radiation Oncology

## 2018-06-22 DIAGNOSIS — C20 Malignant neoplasm of rectum: Secondary | ICD-10-CM | POA: Diagnosis not present

## 2018-06-23 ENCOUNTER — Ambulatory Visit
Admission: RE | Admit: 2018-06-23 | Discharge: 2018-06-23 | Disposition: A | Discharge status: Home / Self Care | Payer: Medicare Other | Source: Ambulatory Visit | Attending: Radiation Oncology | Admitting: Radiation Oncology

## 2018-06-23 DIAGNOSIS — C20 Malignant neoplasm of rectum: Secondary | ICD-10-CM | POA: Diagnosis not present

## 2018-06-24 ENCOUNTER — Ambulatory Visit
Admission: RE | Admit: 2018-06-24 | Discharge: 2018-06-24 | Disposition: A | Discharge status: Home / Self Care | Payer: Medicare Other | Source: Ambulatory Visit | Attending: Radiation Oncology | Admitting: Radiation Oncology

## 2018-06-24 DIAGNOSIS — C20 Malignant neoplasm of rectum: Secondary | ICD-10-CM | POA: Diagnosis not present

## 2018-06-25 ENCOUNTER — Ambulatory Visit
Admission: RE | Admit: 2018-06-25 | Discharge: 2018-06-25 | Disposition: A | Discharge status: Home / Self Care | Payer: Medicare Other | Source: Ambulatory Visit | Attending: Radiation Oncology | Admitting: Radiation Oncology

## 2018-06-25 ENCOUNTER — Other Ambulatory Visit: Payer: Self-pay | Admitting: Radiation Oncology

## 2018-06-25 DIAGNOSIS — C20 Malignant neoplasm of rectum: Secondary | ICD-10-CM | POA: Diagnosis not present

## 2018-06-25 MED ORDER — PROCHLORPERAZINE MALEATE 10 MG PO TABS: 10.0000 mg | ORAL_TABLET | Freq: Four times a day (QID) | ORAL | 0 refills | Status: DC | PRN

## 2018-06-25 NOTE — Progress Notes (Signed)
Pt here for patient teaching.  Pt given Radiation and You booklet.  Reviewed areas of pertinence such as diarrhea, fatigue, hair loss, sexual and fertility changes, skin changes and urinary and bladder changes . Pt able to give teach back of to pat skin, use baby wipes, have Imodium on hand, drink plenty of water and sitz bath,avoid applying anything to skin within 4 hours of treatment. Pt verbalizes understanding of information given and will contact nursing with any questions or concerns.    Gloriajean Dell. Leonie Green, BSN

## 2018-06-28 ENCOUNTER — Ambulatory Visit
Admission: RE | Admit: 2018-06-28 | Discharge: 2018-06-28 | Disposition: A | Discharge status: Home / Self Care | Payer: Medicare Other | Source: Ambulatory Visit | Attending: Radiation Oncology | Admitting: Radiation Oncology

## 2018-06-28 DIAGNOSIS — C20 Malignant neoplasm of rectum: Secondary | ICD-10-CM | POA: Diagnosis not present

## 2018-06-29 ENCOUNTER — Ambulatory Visit
Admission: RE | Admit: 2018-06-29 | Discharge: 2018-06-29 | Disposition: A | Discharge status: Home / Self Care | Payer: Medicare Other | Source: Ambulatory Visit | Attending: Radiation Oncology | Admitting: Radiation Oncology

## 2018-06-29 DIAGNOSIS — C20 Malignant neoplasm of rectum: Secondary | ICD-10-CM | POA: Diagnosis not present

## 2018-06-30 ENCOUNTER — Ambulatory Visit
Admission: RE | Admit: 2018-06-30 | Discharge: 2018-06-30 | Disposition: A | Discharge status: Home / Self Care | Payer: Medicare Other | Source: Ambulatory Visit | Attending: Radiation Oncology | Admitting: Radiation Oncology

## 2018-06-30 DIAGNOSIS — C20 Malignant neoplasm of rectum: Secondary | ICD-10-CM | POA: Diagnosis not present

## 2018-07-01 ENCOUNTER — Ambulatory Visit
Admission: RE | Admit: 2018-07-01 | Discharge: 2018-07-01 | Disposition: A | Discharge status: Home / Self Care | Payer: Medicare Other | Source: Ambulatory Visit | Attending: Radiation Oncology | Admitting: Radiation Oncology

## 2018-07-01 DIAGNOSIS — C20 Malignant neoplasm of rectum: Secondary | ICD-10-CM | POA: Diagnosis not present

## 2018-07-02 ENCOUNTER — Inpatient Hospital Stay: Payer: Medicare Other | Attending: Oncology

## 2018-07-02 ENCOUNTER — Ambulatory Visit
Admission: RE | Admit: 2018-07-02 | Discharge: 2018-07-02 | Disposition: A | Discharge status: Home / Self Care | Payer: Medicare Other | Source: Ambulatory Visit | Attending: Radiation Oncology | Admitting: Radiation Oncology

## 2018-07-02 ENCOUNTER — Telehealth: Payer: Self-pay | Admitting: Oncology

## 2018-07-02 ENCOUNTER — Encounter: Payer: Self-pay | Admitting: Nurse Practitioner

## 2018-07-02 ENCOUNTER — Inpatient Hospital Stay: Payer: Medicare Other | Admitting: Nurse Practitioner

## 2018-07-02 VITALS — BP 132/67 | HR 65 | Temp 98.1°F | Resp 18 | Ht 67.0 in | Wt 167.7 lb

## 2018-07-02 DIAGNOSIS — D701 Agranulocytosis secondary to cancer chemotherapy: Secondary | ICD-10-CM

## 2018-07-02 DIAGNOSIS — D509 Iron deficiency anemia, unspecified: Secondary | ICD-10-CM

## 2018-07-02 DIAGNOSIS — C187 Malignant neoplasm of sigmoid colon: Secondary | ICD-10-CM

## 2018-07-02 DIAGNOSIS — C189 Malignant neoplasm of colon, unspecified: Secondary | ICD-10-CM

## 2018-07-02 DIAGNOSIS — C787 Secondary malignant neoplasm of liver and intrahepatic bile duct: Principal | ICD-10-CM

## 2018-07-02 DIAGNOSIS — C20 Malignant neoplasm of rectum: Secondary | ICD-10-CM | POA: Diagnosis not present

## 2018-07-02 LAB — CMP (CANCER CENTER ONLY)
ALBUMIN: 3.8 g/dL (ref 3.5–5.0)
ALT: 16 U/L (ref 0–44)
ANION GAP: 8 (ref 5–15)
AST: 29 U/L (ref 15–41)
Alkaline Phosphatase: 80 U/L (ref 38–126)
BILIRUBIN TOTAL: 0.4 mg/dL (ref 0.3–1.2)
BUN: 21 mg/dL (ref 8–23)
CHLORIDE: 105 mmol/L (ref 98–111)
CO2: 25 mmol/L (ref 22–32)
Calcium: 8.9 mg/dL (ref 8.9–10.3)
Creatinine: 1.03 mg/dL — ABNORMAL HIGH (ref 0.44–1.00)
GFR, EST NON AFRICAN AMERICAN: 52 mL/min — AB (ref >60)
GFR, Est AFR Am: >60 mL/min (ref >60)
GLUCOSE: 107 mg/dL — AB (ref 70–99)
POTASSIUM: 4.2 mmol/L (ref 3.5–5.1)
Sodium: 138 mmol/L (ref 135–145)
TOTAL PROTEIN: 6.7 g/dL (ref 6.5–8.1)

## 2018-07-02 LAB — CBC WITH DIFFERENTIAL (CANCER CENTER ONLY)
BASOS ABS: 0 10*3/uL (ref 0.0–0.1)
Basophils Relative: 1 %
Eosinophils Absolute: 0.2 10*3/uL (ref 0.0–0.5)
Eosinophils Relative: 8 %
HEMATOCRIT: 33.8 % — AB (ref 34.8–46.6)
Hemoglobin: 11.2 g/dL — ABNORMAL LOW (ref 11.6–15.9)
LYMPHS PCT: 28 %
Lymphs Abs: 0.5 10*3/uL — ABNORMAL LOW (ref 0.9–3.3)
MCH: 26.1 pg (ref 25.1–34.0)
MCHC: 33.1 g/dL (ref 31.5–36.0)
MCV: 78.8 fL — AB (ref 79.5–101.0)
Monocytes Absolute: 0.1 10*3/uL (ref 0.1–0.9)
Monocytes Relative: 4 %
NEUTROS ABS: 1.1 10*3/uL — AB (ref 1.5–6.5)
Neutrophils Relative %: 59 %
Platelet Count: 178 10*3/uL (ref 145–400)
RBC: 4.29 MIL/uL (ref 3.70–5.45)
RDW: 17.7 % — ABNORMAL HIGH (ref 11.2–14.5)
WBC: 1.9 10*3/uL — AB (ref 3.9–10.3)

## 2018-07-02 NOTE — Progress Notes (Addendum)
[facility] OFFICE PROGRESS NOTE   Diagnosis: Colon cancer  INTERVAL HISTORY:   Ms. Cardon returns as scheduled.  She began radiation/Xeloda 06/21/2018.  She has intermittent nausea.  She takes Compazine as needed with good relief.  No significant diarrhea.  No mouth sores.  No hand or foot pain or redness.  She continues to have rectal pain.  She is no longer having rectal bleeding.  She has had about 3 episodes of sweating/dizziness/nausea.  Symptoms resolved fairly quickly without intervention on each occasion.  No associated symptoms such as shortness of breath or chest pain.  Objective:  Vital signs in last 24 hours:  Blood pressure 132/67, pulse 65, temperature 98.1 F (36.7 C), temperature source Oral, resp. rate 18, height '5\' 7"'  (1.702 m), weight 167 lb 11.2 oz (76.1 kg), SpO2 100 %.    HEENT: No thrush or ulcers. Resp: Lungs clear bilaterally. Cardio: Regular rate and rhythm. GI: Abdomen soft and nontender.  No hepatomegaly. Vascular: No leg edema.  Skin: Palms without erythema.   Lab Results:  Lab Results  Component Value Date   WBC 1.9 (L) 07/02/2018   HGB 11.2 (L) 07/02/2018   HCT 33.8 (L) 07/02/2018   MCV 78.8 (L) 07/02/2018   PLT 178 07/02/2018   NEUTROABS 1.1 (L) 07/02/2018    Imaging:  No results found.  Medications: I have reviewed the patient's current medications.  Assessment/Plan: 1. Adenocarcinoma the distal sigmoid colon, poorly differentiated, stage II (T3 N0), status post a laparoscopic assisted sigmoid colectomy 08/09/2015  No loss of mismatch repair protein expression  Elevated preoperative CEA  Staging CT of the abdomen and pelvis 06/22/2015 with no evidence of distant metastatic disease  Mildly elevated CEA 02/23/2017And 04/17/2016  CTs chest, abdomen, and pelvis on 06/04/2016, 33m left upper lobe nodule-no comparison available, new hypodense lesion in the left liver, solid Right renal mass  MRI 06/10/2016-2 enhancing  lesions in the liver consistent with metastatic disease, segment 2 and segment 5. Solid enhancing right renal lesion consistent with a renal cell carcinoma  Radiofrequency ablation of 2 liver lesions on 07/25/2016  Cycle 1 adjuvant Xeloda 08/18/2016  Cycle 2 Xeloda 09/08/2016  CEA improved 09/26/2016  Cycle 3 Xeloda held 09/29/2016 due to unexplained abdominal pain,referred for CT  CT abdomen/pelvis 09/30/2016 with a long segment of bowel wall thickening involving the distal ileum; lesion left hepatic lobe similar to slightly smaller following ablation; lesion posterior right hepatic lobe elongated favored to represent treatment tract.  Cycle 3 Xeloda 10/10/2016 (dose reduced due to drug induced enteritis following cycle 2)discontinued 10/15/2016 secondary to abdominal pain.  CT in while the emergency room with abdominal pain 10/15/2016 revealed descending colitis  Colonoscopy 10/30/2016 confirmed a mass at the: colo-colonicanastomosis  Cycle 4 Xeloda 11/03/2016  01/09/2017 status post a low anterior resection for removal of locally recurrent tumor at the sigmoid anastomosis, resection margins negative, tumor invades pericolonic soft tissue, 12 benign lymph nodes;MSI stable  Cycle 5 Xeloda 02/12/2017  Cycle 6 Xeloda 03/12/2017, Xeloda dose reduced to 1000 mg twice daily on 03/18/2017  Cycle 7 Xeloda 04/09/2017  Cycle 8 Xeloda 05/12/2017  Restaging CTs 06/24/2017-no evidence of metastatic disease involving the chest. Post ablation changes in the liver. No new hepatic metastatic disease.  Restaging CTs 12/14/2017- stable liver ablation sites, indeterminate nodule adjacent to the descending colon, stable left renal mass, changes of early cirrhosis, enlarged porta hepatis node-likely reactive  MRI abdomen 04/22/2018- enlargement of 1.7 cm left paracolic gutter mass, stable 1 cm  extrahepatic left liver capsule lesion, no evidence of local tumor recurrence at the ablation sites, no new  liver lesions, stable right kidney mass  Colonoscopy 05/19/2018- mass in the rectum at the anastomotic site beginning at 3 cm from the anal verge, biopsy confirmed invasive adenocarcinoma  CT 06/07/2018-stable liver ablation sites, enlarging peritoneal lesion at the left pericolic gutter, stable nodule in the sigmoid mesocolon, stable prominent periportal lymph nodes, no evidence of metastatic disease to the chest  Radiation/Xeloda beginning 06/21/2018  Xeloda dose reduced to 1000 mg twice daily days of radiation beginning 07/02/2018 due to neutropenia   2.History ofMicrocytic anemia-likely iron deficiency anemia secondary to colon cancer, persistent microcytic anemia;ferrous sulfate increased to twice daily 12/15/2017;hemoglobin in normal range 03/16/2018, persistent microcytosis  3.Tubular villous adenoma on the sigmoid colon resection specimen 08/09/2015  Multiple tubular adenomas removed on a colonoscopy 11/16/2015  4. Right renal mass on CT 06/04/2016-suspicious for renal cell carcinoma;stable on CT 06/24/2017;referred to urology  5. History ofneutropenia secondary to chemotherapy  6.   Pain secondary to local recurrence of colon cancer   Disposition: Ms. Omar appears stable.  She continues radiation/Xeloda.  Rectal bleeding is better.  She continues to have rectal pain.  We reviewed the CBC from today.  She has mild neutropenia.  She understands to contact the office with fever, chills, other signs of infection.  She will decrease the Xeloda dose to 1000 mg twice daily on days of radiation.  We will repeat a CBC on 07/08/2018.  She will return for lab and follow-up in 2 weeks.  She will contact the office in the interim as outlined above or with any other problems.  Patient seen with Dr. Benay Spice.    [name] ANP/GNP-BC   07/02/2018  11:57 AM  This was a shared visit with [name].  Ms. Graw appears to be tolerating the Xeloda and radiation well, but  she has developed mild neutropenia.  We dose reduce the Xeloda today.  She will return for a CBC 07/08/2018 and an office visit in 2 weeks.  Julieanne Manson, MD

## 2018-07-02 NOTE — Telephone Encounter (Signed)
Appts scheduled AVS declined Calendar printed per 8/16 los

## 2018-07-04 ENCOUNTER — Ambulatory Visit: Admission: RE | Admit: 2018-07-04 | Payer: Medicare Other | Source: Ambulatory Visit

## 2018-07-05 ENCOUNTER — Ambulatory Visit
Admission: RE | Admit: 2018-07-05 | Discharge: 2018-07-05 | Disposition: A | Discharge status: Home / Self Care | Payer: Medicare Other | Source: Ambulatory Visit | Attending: Radiation Oncology | Admitting: Radiation Oncology

## 2018-07-05 DIAGNOSIS — C20 Malignant neoplasm of rectum: Secondary | ICD-10-CM | POA: Diagnosis not present

## 2018-07-06 ENCOUNTER — Ambulatory Visit
Admission: RE | Admit: 2018-07-06 | Discharge: 2018-07-06 | Disposition: A | Discharge status: Home / Self Care | Payer: Medicare Other | Source: Ambulatory Visit | Attending: Radiation Oncology | Admitting: Radiation Oncology

## 2018-07-06 DIAGNOSIS — C20 Malignant neoplasm of rectum: Secondary | ICD-10-CM | POA: Diagnosis not present

## 2018-07-07 ENCOUNTER — Ambulatory Visit
Admission: RE | Admit: 2018-07-07 | Discharge: 2018-07-07 | Disposition: A | Discharge status: Home / Self Care | Payer: Medicare Other | Source: Ambulatory Visit | Attending: Radiation Oncology | Admitting: Radiation Oncology

## 2018-07-07 ENCOUNTER — Telehealth: Payer: Self-pay | Admitting: *Deleted

## 2018-07-07 DIAGNOSIS — C20 Malignant neoplasm of rectum: Secondary | ICD-10-CM | POA: Diagnosis not present

## 2018-07-07 NOTE — Telephone Encounter (Signed)
Left a detailed message for the patient in regards to her lower abdominal pains.  Let her know that she should try some otc simethicone/gas x and start miralax daily.  Informed her to come around to the clinic tomorrow if she has any further concerns.  Left a call back number in case she had any questions.  Will continue to follow as necessary.  Gloriajean Dell. Leonie Green, BSN

## 2018-07-07 NOTE — Telephone Encounter (Signed)
TC from pt's daughter, Nichole Cross. She states that her mother is on her way here for radiation treatment. [name] that her mother is not feeling well and is not sure if her mother will tell anyone. Main c/o is nausea.  Message sent to Dr. Ida Rogue nurse.

## 2018-07-08 ENCOUNTER — Telehealth: Payer: Self-pay | Admitting: Oncology

## 2018-07-08 ENCOUNTER — Inpatient Hospital Stay: Payer: Medicare Other

## 2018-07-08 ENCOUNTER — Ambulatory Visit
Admission: RE | Admit: 2018-07-08 | Discharge: 2018-07-08 | Disposition: A | Discharge status: Home / Self Care | Payer: Medicare Other | Source: Ambulatory Visit | Attending: Radiation Oncology | Admitting: Radiation Oncology

## 2018-07-08 DIAGNOSIS — C787 Secondary malignant neoplasm of liver and intrahepatic bile duct: Principal | ICD-10-CM

## 2018-07-08 DIAGNOSIS — C20 Malignant neoplasm of rectum: Secondary | ICD-10-CM | POA: Diagnosis not present

## 2018-07-08 DIAGNOSIS — C189 Malignant neoplasm of colon, unspecified: Secondary | ICD-10-CM

## 2018-07-08 DIAGNOSIS — C187 Malignant neoplasm of sigmoid colon: Secondary | ICD-10-CM | POA: Diagnosis not present

## 2018-07-08 LAB — CBC WITH DIFFERENTIAL (CANCER CENTER ONLY)
Basophils Absolute: 0 10*3/uL (ref 0.0–0.1)
Basophils Relative: 0 %
EOS PCT: 3 %
Eosinophils Absolute: 0.1 10*3/uL (ref 0.0–0.5)
HEMATOCRIT: 34 % — AB (ref 34.8–46.6)
HEMOGLOBIN: 11.4 g/dL — AB (ref 11.6–15.9)
LYMPHS ABS: 0.2 10*3/uL — AB (ref 0.9–3.3)
LYMPHS PCT: 6 %
MCH: 26.5 pg (ref 25.1–34.0)
MCHC: 33.5 g/dL (ref 31.5–36.0)
MCV: 78.9 fL — AB (ref 79.5–101.0)
Monocytes Absolute: 0.3 10*3/uL (ref 0.1–0.9)
Monocytes Relative: 6 %
Neutro Abs: 3.9 10*3/uL (ref 1.5–6.5)
Neutrophils Relative %: 85 %
Platelet Count: 153 10*3/uL (ref 145–400)
RBC: 4.31 MIL/uL (ref 3.70–5.45)
RDW: 17.6 % — ABNORMAL HIGH (ref 11.2–14.5)
WBC: 4.6 10*3/uL (ref 3.9–10.3)

## 2018-07-08 NOTE — Telephone Encounter (Signed)
Spoke to PT regarding upcoming aug appts per 8/22 sch message.  °

## 2018-07-09 ENCOUNTER — Telehealth: Payer: Self-pay | Admitting: Emergency Medicine

## 2018-07-09 ENCOUNTER — Ambulatory Visit
Admission: RE | Admit: 2018-07-09 | Discharge: 2018-07-09 | Disposition: A | Discharge status: Home / Self Care | Payer: Medicare Other | Source: Ambulatory Visit | Attending: Radiation Oncology | Admitting: Radiation Oncology

## 2018-07-09 DIAGNOSIS — C20 Malignant neoplasm of rectum: Secondary | ICD-10-CM | POA: Diagnosis not present

## 2018-07-09 NOTE — Telephone Encounter (Addendum)
Pt verbalized understanding   ----- Message from Owens Shark, NP sent at 07/08/2018  5:27 PM EDT ----- Please let her know the white count is better. Continue Xeloda at the reduced dose. Follow-up as scheduled.

## 2018-07-12 ENCOUNTER — Other Ambulatory Visit: Payer: Medicare Other

## 2018-07-12 ENCOUNTER — Ambulatory Visit: Payer: Medicare Other | Admitting: Nurse Practitioner

## 2018-07-12 ENCOUNTER — Ambulatory Visit
Admission: RE | Admit: 2018-07-12 | Discharge: 2018-07-12 | Disposition: A | Discharge status: Home / Self Care | Payer: Medicare Other | Source: Ambulatory Visit | Attending: Radiation Oncology | Admitting: Radiation Oncology

## 2018-07-12 DIAGNOSIS — C20 Malignant neoplasm of rectum: Secondary | ICD-10-CM | POA: Diagnosis not present

## 2018-07-13 ENCOUNTER — Ambulatory Visit
Admission: RE | Admit: 2018-07-13 | Discharge: 2018-07-13 | Disposition: A | Discharge status: Home / Self Care | Payer: Medicare Other | Source: Ambulatory Visit | Attending: Radiation Oncology | Admitting: Radiation Oncology

## 2018-07-13 DIAGNOSIS — C20 Malignant neoplasm of rectum: Secondary | ICD-10-CM | POA: Diagnosis not present

## 2018-07-14 ENCOUNTER — Ambulatory Visit
Admission: RE | Admit: 2018-07-14 | Discharge: 2018-07-14 | Disposition: A | Discharge status: Home / Self Care | Payer: Medicare Other | Source: Ambulatory Visit | Attending: Radiation Oncology | Admitting: Radiation Oncology

## 2018-07-14 DIAGNOSIS — C20 Malignant neoplasm of rectum: Secondary | ICD-10-CM | POA: Diagnosis not present

## 2018-07-15 ENCOUNTER — Ambulatory Visit
Admission: RE | Admit: 2018-07-15 | Discharge: 2018-07-15 | Disposition: A | Discharge status: Home / Self Care | Payer: Medicare Other | Source: Ambulatory Visit | Attending: Radiation Oncology | Admitting: Radiation Oncology

## 2018-07-15 DIAGNOSIS — C20 Malignant neoplasm of rectum: Secondary | ICD-10-CM | POA: Diagnosis not present

## 2018-07-16 ENCOUNTER — Inpatient Hospital Stay: Payer: Medicare Other

## 2018-07-16 ENCOUNTER — Ambulatory Visit
Admission: RE | Admit: 2018-07-16 | Discharge: 2018-07-16 | Disposition: A | Discharge status: Home / Self Care | Payer: Medicare Other | Source: Ambulatory Visit | Attending: Radiation Oncology | Admitting: Radiation Oncology

## 2018-07-16 ENCOUNTER — Telehealth: Payer: Self-pay | Admitting: Oncology

## 2018-07-16 ENCOUNTER — Inpatient Hospital Stay (HOSPITAL_BASED_OUTPATIENT_CLINIC_OR_DEPARTMENT_OTHER): Payer: Medicare Other | Admitting: Oncology

## 2018-07-16 VITALS — BP 108/74 | HR 96 | Resp 18 | Ht 67.0 in | Wt 160.1 lb

## 2018-07-16 DIAGNOSIS — C187 Malignant neoplasm of sigmoid colon: Secondary | ICD-10-CM | POA: Diagnosis not present

## 2018-07-16 DIAGNOSIS — C189 Malignant neoplasm of colon, unspecified: Secondary | ICD-10-CM

## 2018-07-16 DIAGNOSIS — C20 Malignant neoplasm of rectum: Secondary | ICD-10-CM | POA: Diagnosis not present

## 2018-07-16 DIAGNOSIS — D509 Iron deficiency anemia, unspecified: Secondary | ICD-10-CM | POA: Diagnosis not present

## 2018-07-16 DIAGNOSIS — D701 Agranulocytosis secondary to cancer chemotherapy: Secondary | ICD-10-CM | POA: Diagnosis not present

## 2018-07-16 DIAGNOSIS — C787 Secondary malignant neoplasm of liver and intrahepatic bile duct: Principal | ICD-10-CM

## 2018-07-16 LAB — CBC WITH DIFFERENTIAL (CANCER CENTER ONLY)
BASOS ABS: 0 10*3/uL (ref 0.0–0.1)
BASOS PCT: 0 %
EOS PCT: 0 %
Eosinophils Absolute: 0 10*3/uL (ref 0.0–0.5)
HCT: 35.6 % (ref 34.8–46.6)
Hemoglobin: 12 g/dL (ref 11.6–15.9)
LYMPHS PCT: 5 %
Lymphs Abs: 0.3 10*3/uL — ABNORMAL LOW (ref 0.9–3.3)
MCH: 26.5 pg (ref 25.1–34.0)
MCHC: 33.7 g/dL (ref 31.5–36.0)
MCV: 78.6 fL — ABNORMAL LOW (ref 79.5–101.0)
MONO ABS: 0.5 10*3/uL (ref 0.1–0.9)
Monocytes Relative: 10 %
NEUTROS ABS: 4.3 10*3/uL (ref 1.5–6.5)
Neutrophils Relative %: 85 %
PLATELETS: 252 10*3/uL (ref 145–400)
RBC: 4.53 MIL/uL (ref 3.70–5.45)
RDW: 20.1 % — AB (ref 11.2–14.5)
WBC: 5.1 10*3/uL (ref 3.9–10.3)

## 2018-07-16 LAB — CMP (CANCER CENTER ONLY)
ALT: 17 U/L (ref 0–44)
AST: 26 U/L (ref 15–41)
Albumin: 3 g/dL — ABNORMAL LOW (ref 3.5–5.0)
Alkaline Phosphatase: 96 U/L (ref 38–126)
Anion gap: 10 (ref 5–15)
BILIRUBIN TOTAL: 1 mg/dL (ref 0.3–1.2)
BUN: 14 mg/dL (ref 8–23)
CHLORIDE: 95 mmol/L — AB (ref 98–111)
CO2: 28 mmol/L (ref 22–32)
Calcium: 9.2 mg/dL (ref 8.9–10.3)
Creatinine: 1.05 mg/dL — ABNORMAL HIGH (ref 0.44–1.00)
GFR, EST AFRICAN AMERICAN: 59 mL/min — AB (ref >60)
GFR, EST NON AFRICAN AMERICAN: 51 mL/min — AB (ref >60)
Glucose, Bld: 131 mg/dL — ABNORMAL HIGH (ref 70–99)
POTASSIUM: 4 mmol/L (ref 3.5–5.1)
Sodium: 133 mmol/L — ABNORMAL LOW (ref 135–145)
TOTAL PROTEIN: 6.4 g/dL — AB (ref 6.5–8.1)

## 2018-07-16 MED ORDER — HYDROCODONE-ACETAMINOPHEN 5-325 MG PO TABS: 1.0000 | ORAL_TABLET | ORAL | 0 refills | Status: DC | PRN

## 2018-07-16 NOTE — Progress Notes (Signed)
[facility] OFFICE PROGRESS NOTE   Diagnosis: Colon cancer  INTERVAL HISTORY:    Ms. Pondexter returns as scheduled.  She continues Xeloda and radiation.  No rectal bleeding.  She continues to have abdominal and rectal pain.  She request a refill on hydrocodone. She has multiple diarrhea stools per day.  She is eating and drinking.  Sores or hand/foot pain  Objective:  Vital signs in last 24 hours:  Blood pressure 108/74, pulse 96, resp. rate 18, height '5\' 7"'  (1.702 m), weight 160 lb 1.6 oz (72.6 kg), SpO2 100 %.    HEENT: No thrush or ulcers Resp: Lungs clear bilaterally Cardio: Regular rate and rhythm GI: No hepatomegaly, mild tenderness in the mid abdomen mass Vascular: No leg edema  Skin: Erythema at the gluteal fold and perineum.  No skin breakdown.  Palms and soles without erythema  Portacath/PICC-without erythema  Lab Results:  Lab Results  Component Value Date   WBC 5.1 07/16/2018   HGB 12.0 07/16/2018   HCT 35.6 07/16/2018   MCV 78.6 (L) 07/16/2018   PLT 252 07/16/2018   NEUTROABS 4.3 07/16/2018    CMP  Lab Results  Component Value Date   NA 133 (L) 07/16/2018   K 4.0 07/16/2018   CL 95 (L) 07/16/2018   CO2 28 07/16/2018   GLUCOSE 131 (H) 07/16/2018   BUN 14 07/16/2018   CREATININE 1.05 (H) 07/16/2018   CALCIUM 9.2 07/16/2018   PROT 6.4 (L) 07/16/2018   ALBUMIN 3.0 (L) 07/16/2018   AST 26 07/16/2018   ALT 17 07/16/2018   ALKPHOS 96 07/16/2018   BILITOT 1.0 07/16/2018   GFRNONAA 51 (L) 07/16/2018   GFRAA 59 (L) 07/16/2018    Lab Results  Component Value Date   CEA1 15.00 (H) 06/07/2018     Medications: I have reviewed the patient's current medications.   Assessment/Plan: 1. Adenocarcinoma the distal sigmoid colon, poorly differentiated, stage II (T3 N0), status post a laparoscopic assisted sigmoid colectomy 08/09/2015  No loss of mismatch repair protein expression  Elevated preoperative CEA  Staging CT of the abdomen and  pelvis 06/22/2015 with no evidence of distant metastatic disease  Mildly elevated CEA 02/23/2017And 04/17/2016  CTs chest, abdomen, and pelvis on 06/04/2016, 59m left upper lobe nodule-no comparison available, new hypodense lesion in the left liver, solid Right renal mass  MRI 06/10/2016-2 enhancing lesions in the liver consistent with metastatic disease, segment 2 and segment 5. Solid enhancing right renal lesion consistent with a renal cell carcinoma  Radiofrequency ablation of 2 liver lesions on 07/25/2016  Cycle 1 adjuvant Xeloda 08/18/2016  Cycle 2 Xeloda 09/08/2016  CEA improved 09/26/2016  Cycle 3 Xeloda held 09/29/2016 due to unexplained abdominal pain,referred for CT  CT abdomen/pelvis 09/30/2016 with a long segment of bowel wall thickening involving the distal ileum; lesion left hepatic lobe similar to slightly smaller following ablation; lesion posterior right hepatic lobe elongated favored to represent treatment tract.  Cycle 3 Xeloda 10/10/2016 (dose reduced due to drug induced enteritis following cycle 2)discontinued 10/15/2016 secondary to abdominal pain.  CT in while the emergency room with abdominal pain 10/15/2016 revealed descending colitis  Colonoscopy 10/30/2016 confirmed a mass at the: colo-colonicanastomosis  Cycle 4 Xeloda 11/03/2016  01/09/2017 status post a low anterior resection for removal of locally recurrent tumor at the sigmoid anastomosis, resection margins negative, tumor invades pericolonic soft tissue, 12 benign lymph nodes;MSI stable  Cycle 5 Xeloda 02/12/2017  Cycle 6 Xeloda 03/12/2017, Xeloda dose reduced to 1000  mg twice daily on 03/18/2017  Cycle 7 Xeloda 04/09/2017  Cycle 8 Xeloda 05/12/2017  Restaging CTs 06/24/2017-no evidence of metastatic disease involving the chest. Post ablation changes in the liver. No new hepatic metastatic disease.  Restaging CTs 12/14/2017- stable liver ablation sites, indeterminate nodule adjacent to the  descending colon, stable left renal mass, changes of early cirrhosis, enlarged porta hepatis node-likely reactive  MRI abdomen 04/22/2018- enlargement of 1.7 cm left paracolic gutter mass, stable 1 cm extrahepatic left liver capsule lesion, no evidence of local tumor recurrence at the ablation sites, no new liver lesions, stable right kidney mass  Colonoscopy 05/19/2018- mass in the rectum at the anastomotic site beginning at 3 cm from the anal verge, biopsy confirmed invasive adenocarcinoma  CT 06/07/2018-stable liver ablation sites, enlarging peritoneal lesion at the left pericolic gutter, stable nodule in the sigmoid mesocolon, stable prominent periportal lymph nodes, no evidence of metastatic disease to the chest  Radiation/Xeloda beginning 06/21/2018  Xeloda dose reduced to 1000 mg twice daily days of radiation beginning 07/02/2018 due to neutropenia   2.History ofMicrocytic anemia-likely iron deficiency anemia secondary to colon cancer, persistent microcytic anemia;ferrous sulfate increased to twice daily 12/15/2017;hemoglobin in normal range 03/16/2018, persistent microcytosis  3.Tubular villous adenoma on the sigmoid colon resection specimen 08/09/2015  Multiple tubular adenomas removed on a colonoscopy 11/16/2015  4. Right renal mass on CT 06/04/2016-suspicious for renal cell carcinoma;stable on CT 06/24/2017;referred to urology  5. History ofneutropenia secondary to chemotherapy  6.Pain secondary to local recurrence of colon cancer    Disposition: Ms. Schnackenberg is completing palliative radiation and capecitabine for treatment of locally recurrent rectal cancer.  Bleeding has improved.  She is having diarrhea and abdominal pain.  She will use hydrocodone as needed for pain.  I encouraged her to increase the use of Imodium for diarrhea.  She will fluids and nutrition supplements.  Ms. Cassell will be scheduled for an office and lab visit 07/26/2018.  She will contact us in  the interim as needed.  [name], MD  07/16/2018  5:13 PM

## 2018-07-16 NOTE — Telephone Encounter (Signed)
Appts scheduled AVS/Calendar printed per 8/30 los

## 2018-07-17 ENCOUNTER — Emergency Department (HOSPITAL_COMMUNITY)
Admission: EM | Admit: 2018-07-17 | Discharge: 2018-07-17 | Disposition: A | Discharge status: Home / Self Care | Payer: Medicare Other | Attending: Emergency Medicine | Admitting: Emergency Medicine

## 2018-07-17 ENCOUNTER — Encounter (HOSPITAL_COMMUNITY): Payer: Self-pay | Admitting: Emergency Medicine

## 2018-07-17 ENCOUNTER — Other Ambulatory Visit: Payer: Self-pay

## 2018-07-17 DIAGNOSIS — R300 Dysuria: Secondary | ICD-10-CM

## 2018-07-17 DIAGNOSIS — E039 Hypothyroidism, unspecified: Secondary | ICD-10-CM | POA: Diagnosis not present

## 2018-07-17 DIAGNOSIS — Z85038 Personal history of other malignant neoplasm of large intestine: Secondary | ICD-10-CM | POA: Diagnosis not present

## 2018-07-17 DIAGNOSIS — Z79899 Other long term (current) drug therapy: Secondary | ICD-10-CM | POA: Diagnosis not present

## 2018-07-17 DIAGNOSIS — R197 Diarrhea, unspecified: Secondary | ICD-10-CM | POA: Diagnosis present

## 2018-07-17 LAB — COMPREHENSIVE METABOLIC PANEL
ALK PHOS: 91 U/L (ref 38–126)
ALT: 19 U/L (ref 0–44)
AST: 29 U/L (ref 15–41)
Albumin: 3.5 g/dL (ref 3.5–5.0)
Anion gap: 10 (ref 5–15)
BUN: 18 mg/dL (ref 8–23)
CALCIUM: 8.8 mg/dL — AB (ref 8.9–10.3)
CHLORIDE: 97 mmol/L — AB (ref 98–111)
CO2: 28 mmol/L (ref 22–32)
CREATININE: 0.93 mg/dL (ref 0.44–1.00)
GFR, EST NON AFRICAN AMERICAN: 59 mL/min — AB (ref >60)
Glucose, Bld: 130 mg/dL — ABNORMAL HIGH (ref 70–99)
Potassium: 3.6 mmol/L (ref 3.5–5.1)
Sodium: 135 mmol/L (ref 135–145)
Total Bilirubin: 1.2 mg/dL (ref 0.3–1.2)
Total Protein: 6.3 g/dL — ABNORMAL LOW (ref 6.5–8.1)

## 2018-07-17 LAB — URINALYSIS, ROUTINE W REFLEX MICROSCOPIC
Bacteria, UA: NONE SEEN
Bilirubin Urine: NEGATIVE
Glucose, UA: >=500 mg/dL — AB
Ketones, ur: 5 mg/dL — AB
Nitrite: NEGATIVE
PH: 6 (ref 5.0–8.0)
Protein, ur: 100 mg/dL — AB
SPECIFIC GRAVITY, URINE: 1.031 — AB (ref 1.005–1.030)

## 2018-07-17 LAB — CBC
HCT: 36.1 % (ref 36.0–46.0)
Hemoglobin: 12.2 g/dL (ref 12.0–15.0)
MCH: 26.3 pg (ref 26.0–34.0)
MCHC: 33.8 g/dL (ref 30.0–36.0)
MCV: 78 fL (ref 78.0–100.0)
PLATELETS: 289 10*3/uL (ref 150–400)
RBC: 4.63 MIL/uL (ref 3.87–5.11)
RDW: 20 % — ABNORMAL HIGH (ref 11.5–15.5)
WBC: 6.7 10*3/uL (ref 4.0–10.5)

## 2018-07-17 LAB — LIPASE, BLOOD: LIPASE: 20 U/L (ref 11–51)

## 2018-07-17 MED ORDER — SODIUM CHLORIDE 0.9 % IV BOLUS
1000.0000 mL | Freq: Once | INTRAVENOUS | Status: AC
  Administered 2018-07-17: 1000 mL via INTRAVENOUS

## 2018-07-17 NOTE — ED Provider Notes (Signed)
[facility] DEPT Provider Note  CSN: [id number] Arrival date & time: 07/17/18  1234   History   Chief Complaint Chief Complaint  Patient presents with  . Diarrhea  . Dehydration  . Dysuria   HPI Nichole Cross is a 77 y.o. female with a medical history of colon cancer (currently on radiation and chemo) with mets, hypothyroidism, anemia and osteoporosis who presented to the ED for diarrhea. Patient reports ongoing diarrhea since starting colon cancer treatments > 5 weeks ago and states that this has been unchanged since then. Patient states she came to the ED today because she was feeling dehydrated. Denies fever, chills, chest pain, SOB, abdominal pain, blood in stool or dark stools. Patient has been taking Imodium 1-2x daily for the diarrhea.  Patient also has complaints of dysuria that began 3 days ago. Denies flank pain, hematuria, urgency, frequency or other vaginal/pelvic complaints. Patient reports that she began taking Azo tablets a couple of days and discussed this with her oncologist as well. Patient states that oncology told her that urinary discomfort is expected given the radiation technique used for her cancer.  Past Medical History:  Diagnosis Date  . Anemia   . Arthritis    knees  . Blood dyscrasia 2016   microcytic anemia, likely iron deficiency anemia secondary to colon cancer  . Cataracts, bilateral   . Colon cancer ([place]) 06/2015   tubular villous adenoma on sigmoid colon specimen (08/09/15), myltiple tubular adenomas removed on a colonoscopy  11/16/15  . Complication of anesthesia   . Headache(784.0)    migraines  . Hypothyroidism   . Liver metastasis ([place])   . Metastasis to kidney ([place])   . Osteoporosis   . PONV (postoperative nausea and vomiting)    NAUSEA  . Renal carcinoma ([place]) 06/2016   ? primary  . Secondary liver cancer ([place]) 06/2016   ? metastasis from colon    Patient Active Problem List   Diagnosis Date Noted    . Local recurrence of rectal cancer  ([place]) 01/09/2017  . Adenocarcinoma of colon metastatic to liver ([place]) 07/25/2016  . Osteopenia, h/o OP 06/07/2016  . Anemia, iron deficiency 06/07/2016  . Sigmoid colon cancer s/p lap assisted sigmoid colectomy 08/09/15 08/09/2015  . Overweight (BMI 25.0-29.9) 09/10/2012  . S/P left UKR 09/09/2012  . ELEVATED BLOOD PRESSURE WITHOUT DIAGNOSIS OF HYPERTENSION 09/30/2010  . PHARYNGITIS 02/17/2008  . GANGLION OF TENDON SHEATH 12/20/2007  . FATTY LIVER DISEASE 08/13/2007  . Hypothyroidism 08/02/2007  . CYSTITIS 08/02/2007  . Osteoarthritis 08/02/2007    Past Surgical History:  Procedure Laterality Date  . ABDOMINAL HYSTERECTOMY  1993   with bilateral BSO  . abdominal tumor  1993  . APPENDECTOMY    . IR GENERIC HISTORICAL  07/01/2016   IR RADIOLOGIST EVAL & MGMT 07/01/2016 Greggory Keen, MD [facility] INTERV RAD  . IR GENERIC HISTORICAL  08/14/2016   IR RADIOLOGIST EVAL & MGMT 08/14/2016 Jacqulynn Cadet, MD [facility] INTERV RAD  . IR RADIOLOGIST EVAL & MGMT  12/24/2017  . IR RADIOLOGIST EVAL & MGMT  04/22/2018  . LAPAROSCOPIC PARTIAL COLECTOMY N/A 08/09/2015   Procedure: LAPAROSCOPIC PARTIAL COLECTOMY;  Surgeon: Jackolyn Confer, MD;  Location: [facility] ORS;  Service: General;  Laterality: N/A;  . PARTIAL KNEE ARTHROPLASTY  09/09/2012   Procedure: UNICOMPARTMENTAL KNEE;  Surgeon: Mauri Pole, MD;  Location: [facility] ORS;  Service: Orthopedics;  Laterality: Left;  . [place] LIVER TUMOR  2018  . XI ROBOTIC ASSISTED LOWER ANTERIOR  RESECTION N/A 01/09/2017   Procedure: XI ROBOTIC ASSISTED LOWER ANTERIOR RESECTION;  Surgeon: Leighton Ruff, MD;  Location: [facility] ORS;  Service: General;  Laterality: N/A;     OB History   None      Home Medications    Prior to Admission medications   Medication Sig Start Date End Date Taking? Authorizing Provider  capecitabine (XELODA) 500 MG tablet Take 3 tablets (1500mg ) by mouth 2 times daily within 30 min of finishing  food. Take on days of radiation only, M-F 06/10/18  Yes Ladell Pier, MD  ferrous sulfate 325 (65 FE) MG EC tablet Take 325 mg by mouth 3 (three) times daily with meals.   Yes [provider]  HYDROcodone-acetaminophen (NORCO) 5-325 MG tablet Take 1 tablet by mouth every 4 (four) hours as needed for moderate pain. 07/16/18  Yes Ladell Pier, MD  levothyroxine (SYNTHROID, LEVOTHROID) 150 MCG tablet Take 1 tablet (150 mcg total) by mouth daily. 02/25/18  Yes Burns, Claudina Lick, MD  prochlorperazine (COMPAZINE) 10 MG tablet Take 1 tablet (10 mg total) by mouth every 6 (six) hours as needed for nausea or vomiting. 06/25/18  Yes Kyung Rudd, MD  traMADol (ULTRAM) 50 MG tablet Take 1 tablet (50 mg total) by mouth every 6 (six) hours as needed. Patient taking differently: Take 50 mg by mouth every 6 (six) hours as needed for moderate pain.  05/25/18  Yes Ladell Pier, MD  aspirin-acetaminophen-caffeine (EXCEDRIN MIGRAINE) [phone] MG tablet Take 1 tablet by mouth every 6 (six) hours as needed for headache or migraine.    [provider]  loperamide (IMODIUM) 2 MG capsule Take 2 mg by mouth as needed for diarrhea or loose stools.    [provider]  polyethylene glycol (MIRALAX / GLYCOLAX) packet Take 17 g by mouth daily as needed for moderate constipation.     [provider]    Family History Family History  Problem Relation Age of Onset  . Pancreatic cancer Mother   . Parkinson's disease Mother   . Breast cancer Sister   . Parkinson's disease Brother   . Parkinson's disease Brother   . Colon cancer Neg Hx   . Esophageal cancer Neg Hx   . Liver cancer Neg Hx   . Rectal cancer Neg Hx   . Stomach cancer Neg Hx     Social History Social History   Tobacco Use  . Smoking status: Never Smoker  . Smokeless tobacco: Never Used  Substance Use Topics  . Alcohol use: No  . Drug use: No     Allergies   Aspirin and Morphine and related   Review of  Systems Review of Systems  Constitutional: Negative for appetite change, chills and fever.  Gastrointestinal: Positive for diarrhea. Negative for abdominal pain, anal bleeding, blood in stool, constipation, nausea, rectal pain and vomiting.  Genitourinary: Positive for dysuria. Negative for decreased urine volume, difficulty urinating, flank pain, frequency, hematuria, pelvic pain, urgency and vaginal pain.  Skin: Negative.   Neurological: Negative.    Physical Exam Updated Vital Signs BP 102/62   Pulse 76   Temp (!) 97.5 F (36.4 C) (Oral)   Resp 16   Ht 5\' 7"  (1.702 m)   Wt 72.6 kg   SpO2 98%   BMI 25.07 kg/m   Physical Exam  Constitutional: Vital signs are normal. She appears well-developed and well-nourished.  Cardiovascular: Normal rate, regular rhythm and normal heart sounds.  Pulmonary/Chest: Effort normal and breath sounds normal.  Abdominal: Soft. Bowel sounds are normal. There is no tenderness. There is no CVA tenderness.  Skin: Skin is warm. Capillary refill takes less than 2 seconds.  Nursing note and vitals reviewed.  ED Treatments / Results  Labs (all labs ordered are listed, but only abnormal results are displayed) Labs Reviewed  COMPREHENSIVE METABOLIC PANEL - Abnormal; Notable for the following components:      Result Value   Chloride 97 (*)    Glucose, Bld 130 (*)    Calcium 8.8 (*)    Total Protein 6.3 (*)    GFR calc non Af Amer 59 (*)    All other components within normal limits  CBC - Abnormal; Notable for the following components:   RDW 20.0 (*)    All other components within normal limits  URINALYSIS, ROUTINE W REFLEX MICROSCOPIC - Abnormal; Notable for the following components:   APPearance HAZY (*)    Specific Gravity, Urine 1.031 (*)    Glucose, UA >=500 (*)    Hgb urine dipstick MODERATE (*)    Ketones, ur 5 (*)    Protein, ur 100 (*)    Leukocytes, UA SMALL (*)    All other components within normal limits  LIPASE, BLOOD     EKG None  Radiology No results found.  Procedures Procedures (including critical care time)  Medications Ordered in ED Medications  sodium chloride 0.9 % bolus 1,000 mL (1,000 mLs Intravenous New Bag/Given 07/17/18 1457)     Initial Impression / Assessment and Plan / ED Course  Triage vital signs and the nursing notes have been reviewed.  Pertinent labs & imaging results that were available during care of the patient were reviewed and considered in medical decision making (see chart for details).  Patient presents for ongoing diarrhea that is related to her colon cancer chemo and radiation treatments. She has no acute changes in her diarrhea, but states that she has been feeling dehydrated and felt the need to come to the ED. Patient's physical exam is unremarkable. She looks well-hydrated and has no other abdominal complaints that raise concern for an acute intra-abdominal process that warrants further evaluation. Will check blood work to evaluate for metabolic abnormalities that come from excessive GI losses.  Patient also has complaints of dysuria without any other accompanying GU complaints. She has been taking Azo on her own, but states that she was told by her oncologist that dysuria is expected given the nature of her radiation therapy. Will evaluate patient for UTI.  Clinical Course as of Jul 17 1506  Sat Jul 17, 2018  1400 Blood work unremarkable. No metabolic abnormalities consistent with excessive GI losses from diarrhea.  UA not suggestive of consistent with UTI.    [GM]    Clinical Course User Index [GM] Nashawn Hillock, Jonelle Sports, PA-C   Final Clinical Impressions(s) / ED Diagnoses  1. Diarrhea. Education provided on OTC and supportive treatment. 2. Dysuria. Side effect from radiation therapy. UA not suggestive of UTI. Advised to continue follow-up with oncologist regarding this complaint.  Dispo: Home. After thorough clinical evaluation, this patient is determined  to be medically stable and can be safely discharged with the previously mentioned treatment and/or outpatient follow-up/referral(s). At this time, there are no other apparent medical conditions that require further screening, evaluation or treatment.   Final diagnoses:  Diarrhea, unspecified type  Dysuria    ED Discharge Orders    None        Romie Jumper, [place] 07/17/18 1507  Valarie Merino, MD 07/17/18 [phone]

## 2018-07-17 NOTE — ED Triage Notes (Addendum)
Pt being treated for colon cancer and had radiation and chemo yesterday. Pt states she has had diarrhea since starting radiation, pt states she is feeling dehydrated and difficulty taking po fluids but did take a Zofran and Vicodin today. Pt also states burning and pain with urination, pt has taken ASO at home.

## 2018-07-17 NOTE — ED Notes (Signed)
EDPA Provider at bedside. 

## 2018-07-17 NOTE — Discharge Instructions (Signed)
Your blood work looks good today! There were no signs of infection or any electrolyte abnormalities that require you to take extra supplements. Be sure to drink plenty of water (at least 64 oz daily). As we discussed, you can take the Imodium 2-3 times a day. I have also included some reading material that includes food that you can incorporate into your diet to help as well.   Your urine did not show a urinary tract infection (UTI). Please follow-up with your oncologist regarding this issue if it persists.  Thank you for allowing me to take care of you today! Well wishes with your remaining treatment.

## 2018-07-20 ENCOUNTER — Ambulatory Visit
Admission: RE | Admit: 2018-07-20 | Discharge: 2018-07-20 | Disposition: A | Discharge status: Home / Self Care | Payer: Medicare Other | Source: Ambulatory Visit | Attending: Radiation Oncology | Admitting: Radiation Oncology

## 2018-07-20 ENCOUNTER — Other Ambulatory Visit: Payer: Self-pay | Admitting: Radiation Oncology

## 2018-07-20 ENCOUNTER — Telehealth: Payer: Self-pay | Admitting: *Deleted

## 2018-07-20 ENCOUNTER — Inpatient Hospital Stay: Payer: Medicare Other

## 2018-07-20 ENCOUNTER — Inpatient Hospital Stay: Payer: Medicare Other | Attending: Oncology

## 2018-07-20 ENCOUNTER — Other Ambulatory Visit: Payer: Self-pay | Admitting: *Deleted

## 2018-07-20 DIAGNOSIS — C20 Malignant neoplasm of rectum: Secondary | ICD-10-CM

## 2018-07-20 DIAGNOSIS — C787 Secondary malignant neoplasm of liver and intrahepatic bile duct: Secondary | ICD-10-CM | POA: Diagnosis not present

## 2018-07-20 DIAGNOSIS — C189 Malignant neoplasm of colon, unspecified: Secondary | ICD-10-CM

## 2018-07-20 LAB — MAGNESIUM: Magnesium: 1.8 mg/dL (ref 1.7–2.4)

## 2018-07-20 MED ORDER — SULFAMETHOXAZOLE-TRIMETHOPRIM 800-160 MG PO TABS: 1.0000 | ORAL_TABLET | Freq: Two times a day (BID) | ORAL | 0 refills | Status: DC

## 2018-07-20 MED ORDER — SODIUM CHLORIDE 0.9 % IV SOLN
INTRAVENOUS | Status: DC
  Administered 2018-07-20: 11:00:00 via INTRAVENOUS
  Filled 2018-07-20 (×2): qty 250

## 2018-07-20 MED ORDER — SODIUM CHLORIDE 0.9 % IV SOLN
Freq: Once | INTRAVENOUS | Status: DC
  Filled 2018-07-20: qty 250

## 2018-07-20 NOTE — Patient Instructions (Addendum)
Dehydration, Adult Dehydration is when there is not enough fluid or water in your body. This happens when you lose more fluids than you take in. Dehydration can range from mild to very bad. It should be treated right away to keep it from getting very bad. Symptoms of mild dehydration may include:  Thirst.  Dry lips.  Slightly dry mouth.  Dry, warm skin.  Dizziness. Symptoms of moderate dehydration may include:  Very dry mouth.  Muscle cramps.  Dark pee (urine). Pee may be the color of tea.  Your body making less pee.  Your eyes making fewer tears.  Heartbeat that is uneven or faster than normal (palpitations).  Headache.  Light-headedness, especially when you stand up from sitting.  Fainting (syncope). Symptoms of very bad dehydration may include:  Changes in skin, such as: ? Cold and clammy skin. ? Blotchy (mottled) or pale skin. ? Skin that does not quickly return to normal after being lightly pinched and let go (poor skin turgor).  Changes in body fluids, such as: ? Feeling very thirsty. ? Your eyes making fewer tears. ? Not sweating when body temperature is high, such as in hot weather. ? Your body making very little pee.  Changes in vital signs, such as: ? Weak pulse. ? Pulse that is more than 100 beats a minute when you are sitting still. ? Fast breathing. ? Low blood pressure.  Other changes, such as: ? Sunken eyes. ? Cold hands and feet. ? Confusion. ? Lack of energy (lethargy). ? Trouble waking up from sleep. ? Short-term weight loss. ? Unconsciousness. Follow these instructions at home:  If told by your doctor, drink an ORS: ? Make an ORS by using instructions on the package. ? Start by drinking small amounts, about  cup (120 mL) every 5-10 minutes. ? Slowly drink more until you have had the amount that your doctor said to have.  Drink enough clear fluid to keep your pee clear or pale yellow. If you were told to drink an ORS, finish the ORS  first, then start slowly drinking clear fluids. Drink fluids such as: ? Water. Do not drink only water by itself. Doing that can make the salt (sodium) level in your body get too low (hyponatremia). ? Ice chips. ? Fruit juice that you have added water to (diluted). ? Low-calorie sports drinks.  Avoid: ? Alcohol. ? Drinks that have a lot of sugar. These include high-calorie sports drinks, fruit juice that does not have water added, and soda. ? Caffeine. ? Foods that are greasy or have a lot of fat or sugar.  Take over-the-counter and prescription medicines only as told by your doctor.  Do not take salt tablets. Doing that can make the salt level in your body get too high (hypernatremia).  Eat foods that have minerals (electrolytes). Examples include bananas, oranges, potatoes, tomatoes, and spinach.  Keep all follow-up visits as told by your doctor. This is important. Contact a doctor if:  You have belly (abdominal) pain that: ? Gets worse. ? Stays in one area (localizes).  You have a rash.  You have a stiff neck.  You get angry or annoyed more easily than normal (irritability).  You are more sleepy than normal.  You have a harder time waking up than normal.  You feel: ? Weak. ? Dizzy. ? Very thirsty.  You have peed (urinated) only a small amount of very dark pee during 6-8 hours. Get help right away if:  You have symptoms of   very bad dehydration.  You cannot drink fluids without throwing up (vomiting).  Your symptoms get worse with treatment.  You have a fever.  You have a very bad headache.  You are throwing up or having watery poop (diarrhea) and it: ? Gets worse. ? Does not go away.  You have blood or something green (bile) in your throw-up.  You have blood in your poop (stool). This may cause poop to look black and tarry.  You have not peed in 6-8 hours.  You pass out (faint).  Your heart rate when you are sitting still is more than 100 beats a  minute.  You have trouble breathing. This information is not intended to replace advice given to you by your health care provider. Make sure you discuss any questions you have with your health care provider. Document Released: 08/30/2009 Document Revised: 05/23/2016 Document Reviewed: 12/28/2015 Elsevier Interactive Patient Education  2018 [employer]  Dehydration, Adult Dehydration is when there is not enough fluid or water in your body. This happens when you lose more fluids than you take in. Dehydration can range from mild to very bad. It should be treated right away to keep it from getting very bad. Symptoms of mild dehydration may include:  Thirst.  Dry lips.  Slightly dry mouth.  Dry, warm skin.  Dizziness. Symptoms of moderate dehydration may include:  Very dry mouth.  Muscle cramps.  Dark pee (urine). Pee may be the color of tea.  Your body making less pee.  Your eyes making fewer tears.  Heartbeat that is uneven or faster than normal (palpitations).  Headache.  Light-headedness, especially when you stand up from sitting.  Fainting (syncope). Symptoms of very bad dehydration may include:  Changes in skin, such as: ? Cold and clammy skin. ? Blotchy (mottled) or pale skin. ? Skin that does not quickly return to normal after being lightly pinched and let go (poor skin turgor).  Changes in body fluids, such as: ? Feeling very thirsty. ? Your eyes making fewer tears. ? Not sweating when body temperature is high, such as in hot weather. ? Your body making very little pee.  Changes in vital signs, such as: ? Weak pulse. ? Pulse that is more than 100 beats a minute when you are sitting still. ? Fast breathing. ? Low blood pressure.  Other changes, such as: ? Sunken eyes. ? Cold hands and feet. ? Confusion. ? Lack of energy (lethargy). ? Trouble waking up from sleep. ? Short-term weight loss. ? Unconsciousness. Follow these instructions at  home:  If told by your doctor, drink an ORS: ? Make an ORS by using instructions on the package. ? Start by drinking small amounts, about  cup (120 mL) every 5-10 minutes. ? Slowly drink more until you have had the amount that your doctor said to have.  Drink enough clear fluid to keep your pee clear or pale yellow. If you were told to drink an ORS, finish the ORS first, then start slowly drinking clear fluids. Drink fluids such as: ? Water. Do not drink only water by itself. Doing that can make the salt (sodium) level in your body get too low (hyponatremia). ? Ice chips. ? Fruit juice that you have added water to (diluted). ? Low-calorie sports drinks.  Avoid: ? Alcohol. ? Drinks that have a lot of sugar. These include high-calorie sports drinks, fruit juice that does not have water added, and soda. ? Caffeine. ? Foods that are greasy or have   a lot of fat or sugar.  Take over-the-counter and prescription medicines only as told by your doctor.  Do not take salt tablets. Doing that can make the salt level in your body get too high (hypernatremia).  Eat foods that have minerals (electrolytes). Examples include bananas, oranges, potatoes, tomatoes, and spinach.  Keep all follow-up visits as told by your doctor. This is important. Contact a doctor if:  You have belly (abdominal) pain that: ? Gets worse. ? Stays in one area (localizes).  You have a rash.  You have a stiff neck.  You get angry or annoyed more easily than normal (irritability).  You are more sleepy than normal.  You have a harder time waking up than normal.  You feel: ? Weak. ? Dizzy. ? Very thirsty.  You have peed (urinated) only a small amount of very dark pee during 6-8 hours. Get help right away if:  You have symptoms of very bad dehydration.  You cannot drink fluids without throwing up (vomiting).  Your symptoms get worse with treatment.  You have a fever.  You have a very bad headache.  You  are throwing up or having watery poop (diarrhea) and it: ? Gets worse. ? Does not go away.  You have blood or something green (bile) in your throw-up.  You have blood in your poop (stool). This may cause poop to look black and tarry.  You have not peed in 6-8 hours.  You pass out (faint).  Your heart rate when you are sitting still is more than 100 beats a minute.  You have trouble breathing. This information is not intended to replace advice given to you by your health care provider. Make sure you discuss any questions you have with your health care provider. Document Released: 08/30/2009 Document Revised: 05/23/2016 Document Reviewed: 12/28/2015 Elsevier Interactive Patient Education  2018 Elsevier Inc.  

## 2018-07-20 NOTE — Progress Notes (Signed)
Treatment with concerns for hypotension.  She is currently undergoing treatment for rectal cancer and is 21 out of 25 fractions to the pelvis.  She will also have her boost next week.  Apparently last week her blood pressure was in the 80s over 60s, and she was encouraged to increase oral hydration and increase her dose of over-the-counter Imodium.  She is taking 1 tablet in the morning 1 in the evening.  Despite this she still continues to have 2-4 stools a day.  She denies any chest pain or shortness of breath, she has not had any rectal bleeding or discharge.  She does express concerns with discomfort with urination, she was seen over the weekend in the emergency department on 07/17/2018, she was given IV fluids, and sent home in stable condition.  Her urinalysis at that time revealed a specific gravity 1.03, 5+ glucose, moderate Hgb ketones, protein, and small leukocytes.  Here in the office, she describes symptoms of feeling poorly to the staff, and was brought around for assessment.  Her blood pressure is recorded below.  She states that she is feeling fatigued and just not well.  She denies any nausea but continues to have midepigastric pain that is treated with pain medication by Dr. Benay Spice.  This is stable.    PE: Wt Readings from Last 3 Encounters:  07/17/18 160 lb 0.9 oz (72.6 kg)  07/16/18 160 lb 1.6 oz (72.6 kg)  07/02/18 167 lb 11.2 oz (76.1 kg)   Temp Readings from Last 3 Encounters:  07/17/18 (!) 97.5 F (36.4 C) (Oral)  07/02/18 98.1 F (36.7 C) (Oral)   BP Readings from Last 3 Encounters:  07/17/18 (!) 100/59  07/16/18 108/74  07/02/18 132/67   Pulse Readings from Last 3 Encounters:  07/17/18 77  07/16/18 96  07/02/18 65   In general this is a well appearing Caucasian female in no acute distress. She is alert and oriented x4 and appropriate throughout the examination. HEENT reveals that the patient is normocephalic, atraumatic. EOMs are intact. Skin is intact without any  evidence of gross lesions. Cardiovascular exam reveals a regular rate and rhythm, no clicks rubs or murmurs are auscultated. Chest is clear to auscultation bilaterally.   Impression/Plan: 1. Hypotension as a result of volume loss from diarrhea from treatment of rectal cancer.  We discussed the options of additional IV hydration, which the patient is in favor of.  We will also obtain a magnesium level to rule out hypomagnesemia as a result of treatment.  She will continue her treatment for rectal cancer as outlined.  Also discussed increasing her over-the-counter Imodium to 2 tablets twice daily.  She is encouraged to call her contact us if she has any questions or concerns or notify us tomorrow following treatment.     Carola Rhine, PAC

## 2018-07-20 NOTE — Telephone Encounter (Signed)
Called and let the patient know that the results of her blood work were normal.  Will continue to follow as necessary.  Gloriajean Dell. Leonie Green, BSN

## 2018-07-21 ENCOUNTER — Ambulatory Visit
Admission: RE | Admit: 2018-07-21 | Discharge: 2018-07-21 | Disposition: A | Discharge status: Home / Self Care | Payer: Medicare Other | Source: Ambulatory Visit | Attending: Radiation Oncology | Admitting: Radiation Oncology

## 2018-07-21 DIAGNOSIS — C20 Malignant neoplasm of rectum: Secondary | ICD-10-CM | POA: Diagnosis not present

## 2018-07-22 ENCOUNTER — Ambulatory Visit
Admission: RE | Admit: 2018-07-22 | Discharge: 2018-07-22 | Disposition: A | Discharge status: Home / Self Care | Payer: Medicare Other | Source: Ambulatory Visit | Attending: Radiation Oncology | Admitting: Radiation Oncology

## 2018-07-22 DIAGNOSIS — C20 Malignant neoplasm of rectum: Secondary | ICD-10-CM | POA: Diagnosis not present

## 2018-07-23 ENCOUNTER — Ambulatory Visit
Admission: RE | Admit: 2018-07-23 | Discharge: 2018-07-23 | Disposition: A | Discharge status: Home / Self Care | Payer: Medicare Other | Source: Ambulatory Visit | Attending: Radiation Oncology | Admitting: Radiation Oncology

## 2018-07-23 DIAGNOSIS — C20 Malignant neoplasm of rectum: Secondary | ICD-10-CM | POA: Diagnosis not present

## 2018-07-24 ENCOUNTER — Emergency Department (HOSPITAL_COMMUNITY): Payer: Medicare Other

## 2018-07-24 ENCOUNTER — Other Ambulatory Visit: Payer: Self-pay

## 2018-07-24 ENCOUNTER — Encounter (HOSPITAL_COMMUNITY): Payer: Self-pay

## 2018-07-24 ENCOUNTER — Emergency Department (HOSPITAL_COMMUNITY)
Admission: EM | Admit: 2018-07-24 | Discharge: 2018-07-24 | Disposition: A | Discharge status: Home / Self Care | Payer: Medicare Other | Attending: Emergency Medicine | Admitting: Emergency Medicine

## 2018-07-24 DIAGNOSIS — Z8505 Personal history of malignant neoplasm of liver: Secondary | ICD-10-CM | POA: Diagnosis not present

## 2018-07-24 DIAGNOSIS — Z85038 Personal history of other malignant neoplasm of large intestine: Secondary | ICD-10-CM | POA: Insufficient documentation

## 2018-07-24 DIAGNOSIS — R1013 Epigastric pain: Secondary | ICD-10-CM | POA: Diagnosis present

## 2018-07-24 DIAGNOSIS — Z79899 Other long term (current) drug therapy: Secondary | ICD-10-CM | POA: Insufficient documentation

## 2018-07-24 DIAGNOSIS — Z96651 Presence of right artificial knee joint: Secondary | ICD-10-CM | POA: Diagnosis not present

## 2018-07-24 DIAGNOSIS — R110 Nausea: Secondary | ICD-10-CM | POA: Diagnosis not present

## 2018-07-24 DIAGNOSIS — E039 Hypothyroidism, unspecified: Secondary | ICD-10-CM | POA: Insufficient documentation

## 2018-07-24 DIAGNOSIS — K520 Gastroenteritis and colitis due to radiation: Secondary | ICD-10-CM | POA: Diagnosis not present

## 2018-07-24 LAB — URINALYSIS, ROUTINE W REFLEX MICROSCOPIC
Bacteria, UA: NONE SEEN
Bilirubin Urine: NEGATIVE
GLUCOSE, UA: NEGATIVE mg/dL
HGB URINE DIPSTICK: NEGATIVE
Ketones, ur: NEGATIVE mg/dL
NITRITE: NEGATIVE
PH: 6 (ref 5.0–8.0)
Protein, ur: NEGATIVE mg/dL
Specific Gravity, Urine: >1.046 — ABNORMAL HIGH (ref 1.005–1.030)

## 2018-07-24 LAB — CBC
HCT: 34.3 % — ABNORMAL LOW (ref 36.0–46.0)
Hemoglobin: 11.6 g/dL — ABNORMAL LOW (ref 12.0–15.0)
MCH: 27.1 pg (ref 26.0–34.0)
MCHC: 33.8 g/dL (ref 30.0–36.0)
MCV: 80.1 fL (ref 78.0–100.0)
PLATELETS: 363 10*3/uL (ref 150–400)
RBC: 4.28 MIL/uL (ref 3.87–5.11)
RDW: 22.7 % — AB (ref 11.5–15.5)
WBC: 4.4 10*3/uL (ref 4.0–10.5)

## 2018-07-24 LAB — COMPREHENSIVE METABOLIC PANEL
ALBUMIN: 2.7 g/dL — AB (ref 3.5–5.0)
ALK PHOS: 96 U/L (ref 38–126)
ALT: 12 U/L (ref 0–44)
AST: 22 U/L (ref 15–41)
Anion gap: 13 (ref 5–15)
BILIRUBIN TOTAL: 1.1 mg/dL (ref 0.3–1.2)
BUN: 19 mg/dL (ref 8–23)
CALCIUM: 8.5 mg/dL — AB (ref 8.9–10.3)
CO2: 22 mmol/L (ref 22–32)
CREATININE: 0.95 mg/dL (ref 0.44–1.00)
Chloride: 97 mmol/L — ABNORMAL LOW (ref 98–111)
GFR calc Af Amer: >60 mL/min (ref >60)
GFR calc non Af Amer: 57 mL/min — ABNORMAL LOW (ref >60)
GLUCOSE: 111 mg/dL — AB (ref 70–99)
Potassium: 3.9 mmol/L (ref 3.5–5.1)
SODIUM: 132 mmol/L — AB (ref 135–145)
TOTAL PROTEIN: 6 g/dL — AB (ref 6.5–8.1)

## 2018-07-24 LAB — LIPASE, BLOOD: Lipase: 26 U/L (ref 11–51)

## 2018-07-24 MED ORDER — HYDROMORPHONE HCL 1 MG/ML IJ SOLN
0.5000 mg | Freq: Once | INTRAMUSCULAR | Status: AC
  Administered 2018-07-24: 0.5 mg via INTRAVENOUS
  Filled 2018-07-24: qty 1

## 2018-07-24 MED ORDER — METOCLOPRAMIDE HCL 5 MG/ML IJ SOLN
5.0000 mg | Freq: Once | INTRAMUSCULAR | Status: AC
  Administered 2018-07-24: 5 mg via INTRAVENOUS
  Filled 2018-07-24: qty 2

## 2018-07-24 MED ORDER — DIPHENHYDRAMINE HCL 50 MG/ML IJ SOLN
12.5000 mg | Freq: Once | INTRAMUSCULAR | Status: AC
  Administered 2018-07-24: 12.5 mg via INTRAVENOUS
  Filled 2018-07-24: qty 1

## 2018-07-24 MED ORDER — IOPAMIDOL (ISOVUE-300) INJECTION 61%
INTRAVENOUS | Status: AC
  Filled 2018-07-24: qty 100

## 2018-07-24 MED ORDER — HYDROCODONE-ACETAMINOPHEN 7.5-325 MG/15ML PO SOLN: 15.0000 mL | Freq: Four times a day (QID) | ORAL | 0 refills | Status: DC | PRN

## 2018-07-24 MED ORDER — SODIUM CHLORIDE 0.9 % IV BOLUS: 2000.0000 mL | Freq: Once | INTRAVENOUS | Status: DC

## 2018-07-24 MED ORDER — IOPAMIDOL (ISOVUE-300) INJECTION 61%
100.0000 mL | Freq: Once | INTRAVENOUS | Status: AC | PRN
  Administered 2018-07-24: 100 mL via INTRAVENOUS

## 2018-07-24 MED ORDER — SODIUM CHLORIDE 0.9 % IV SOLN
INTRAVENOUS | Status: DC
  Administered 2018-07-24: 09:00:00 via INTRAVENOUS

## 2018-07-24 MED ORDER — SODIUM CHLORIDE 0.9 % IV SOLN
INTRAVENOUS | Status: DC
  Administered 2018-07-24: 10:00:00 via INTRAVENOUS

## 2018-07-24 NOTE — ED Notes (Signed)
Patient Intake ::  1/2 [place] sandwich, 4 oz applesauce, 4 oz sprite   1230

## 2018-07-24 NOTE — Discharge Instructions (Addendum)
Return here for fever, worsening pain, or vomiting.  Keep your appointment at the cancer center on Monday

## 2018-07-24 NOTE — ED Triage Notes (Signed)
Patient c/o mid abdominal pain that has gotten worse since last night. Patient is currently receiving chemo and radiation. Patient states her Vicodin is not helping the pain any more.

## 2018-07-24 NOTE — ED Provider Notes (Addendum)
[facility] DEPT Provider Note   CSN: [id number] Arrival date & time: 07/24/18  7782     History   Chief Complaint Chief Complaint  Patient presents with  . Abdominal Pain  . Nausea    HPI Amry Cathy Mahn is a 77 y.o. female.  77 year old female with history of rectal cancer presents with 1 day of midepigastric abdominal pain characterizes dull and persistent.  Symptoms were temporary relieved with Vicodin but have persisted.  He has had nausea but no vomiting.  No diarrhea.  No fever or chills.  Notes some abdominal distention.  No urinary symptoms.     Past Medical History:  Diagnosis Date  . Anemia   . Arthritis    knees  . Blood dyscrasia 2016   microcytic anemia, likely iron deficiency anemia secondary to colon cancer  . Cataracts, bilateral   . Colon cancer ([place]) 06/2015   tubular villous adenoma on sigmoid colon specimen (08/09/15), myltiple tubular adenomas removed on a colonoscopy  11/16/15  . Complication of anesthesia   . Headache(784.0)    migraines  . Hypothyroidism   . Liver metastasis ([place])   . Metastasis to kidney ([place])   . Osteoporosis   . PONV (postoperative nausea and vomiting)    NAUSEA  . Renal carcinoma ([place]) 06/2016   ? primary  . Secondary liver cancer ([place]) 06/2016   ? metastasis from colon    Patient Active Problem List   Diagnosis Date Noted  . Local recurrence of rectal cancer  ([place]) 01/09/2017  . Adenocarcinoma of colon metastatic to liver ([place]) 07/25/2016  . Osteopenia, h/o OP 06/07/2016  . Anemia, iron deficiency 06/07/2016  . Sigmoid colon cancer s/p lap assisted sigmoid colectomy 08/09/15 08/09/2015  . Overweight (BMI 25.0-29.9) 09/10/2012  . S/P left UKR 09/09/2012  . ELEVATED BLOOD PRESSURE WITHOUT DIAGNOSIS OF HYPERTENSION 09/30/2010  . PHARYNGITIS 02/17/2008  . GANGLION OF TENDON SHEATH 12/20/2007  . FATTY LIVER DISEASE 08/13/2007  . Hypothyroidism 08/02/2007  . CYSTITIS 08/02/2007  .  Osteoarthritis 08/02/2007    Past Surgical History:  Procedure Laterality Date  . ABDOMINAL HYSTERECTOMY  1993   with bilateral BSO  . abdominal tumor  1993  . APPENDECTOMY    . IR GENERIC HISTORICAL  07/01/2016   IR RADIOLOGIST EVAL & MGMT 07/01/2016 Greggory Keen, MD [facility] INTERV RAD  . IR GENERIC HISTORICAL  08/14/2016   IR RADIOLOGIST EVAL & MGMT 08/14/2016 Jacqulynn Cadet, MD [facility] INTERV RAD  . IR RADIOLOGIST EVAL & MGMT  12/24/2017  . IR RADIOLOGIST EVAL & MGMT  04/22/2018  . LAPAROSCOPIC PARTIAL COLECTOMY N/A 08/09/2015   Procedure: LAPAROSCOPIC PARTIAL COLECTOMY;  Surgeon: Jackolyn Confer, MD;  Location: [facility] ORS;  Service: General;  Laterality: N/A;  . PARTIAL KNEE ARTHROPLASTY  09/09/2012   Procedure: UNICOMPARTMENTAL KNEE;  Surgeon: Mauri Pole, MD;  Location: [facility] ORS;  Service: Orthopedics;  Laterality: Left;  . [place] LIVER TUMOR  2018  . XI ROBOTIC ASSISTED LOWER ANTERIOR RESECTION N/A 01/09/2017   Procedure: XI ROBOTIC ASSISTED LOWER ANTERIOR RESECTION;  Surgeon: Leighton Ruff, MD;  Location: [facility] ORS;  Service: General;  Laterality: N/A;     OB History   None      Home Medications    Prior to Admission medications   Medication Sig Start Date End Date Taking? Authorizing Provider  aspirin-acetaminophen-caffeine (EXCEDRIN MIGRAINE) [phone] MG tablet Take 1 tablet by mouth every 6 (six) hours as needed for headache or migraine.  [provider]  capecitabine (XELODA) 500 MG tablet Take 3 tablets (1500mg ) by mouth 2 times daily within 30 min of finishing food. Take on days of radiation only, M-F 06/10/18   Ladell Pier, MD  ferrous sulfate 325 (65 FE) MG EC tablet Take 325 mg by mouth 3 (three) times daily with meals.    [provider]  HYDROcodone-acetaminophen (NORCO) 5-325 MG tablet Take 1 tablet by mouth every 4 (four) hours as needed for moderate pain. 07/16/18   Ladell Pier, MD  levothyroxine (SYNTHROID, LEVOTHROID) 150  MCG tablet Take 1 tablet (150 mcg total) by mouth daily. 02/25/18   [name], MD  loperamide (IMODIUM) 2 MG capsule Take 2 mg by mouth as needed for diarrhea or loose stools.    [provider]  polyethylene glycol (MIRALAX / GLYCOLAX) packet Take 17 g by mouth daily as needed for moderate constipation.     [provider]  prochlorperazine (COMPAZINE) 10 MG tablet Take 1 tablet (10 mg total) by mouth every 6 (six) hours as needed for nausea or vomiting. 06/25/18   Kyung Rudd, MD  sulfamethoxazole-trimethoprim (BACTRIM DS,SEPTRA DS) 800-160 MG tablet Take 1 tablet by mouth 2 (two) times daily. 07/20/18   Hayden Pedro, PA-C  traMADol (ULTRAM) 50 MG tablet Take 1 tablet (50 mg total) by mouth every 6 (six) hours as needed. Patient taking differently: Take 50 mg by mouth every 6 (six) hours as needed for moderate pain.  05/25/18   Ladell Pier, MD    Family History Family History  Problem Relation Age of Onset  . Pancreatic cancer Mother   . Parkinson's disease Mother   . Breast cancer Sister   . Parkinson's disease Brother   . Parkinson's disease Brother   . Colon cancer Neg Hx   . Esophageal cancer Neg Hx   . Liver cancer Neg Hx   . Rectal cancer Neg Hx   . Stomach cancer Neg Hx     Social History Social History   Tobacco Use  . Smoking status: Never Smoker  . Smokeless tobacco: Never Used  Substance Use Topics  . Alcohol use: No  . Drug use: No     Allergies   Aspirin and Morphine and related   Review of Systems Review of Systems  All other systems reviewed and are negative.    Physical Exam Updated Vital Signs BP (!) 96/59 (BP Location: Left Arm)   Pulse 85   Temp 97.9 F (36.6 C)   Resp 18   Ht 1.702 m (5\' 7" )   Wt 72.6 kg   SpO2 95%   BMI 25.06 kg/m   Physical Exam  Constitutional: She is oriented to person, place, and time. She appears well-developed and well-nourished.  Non-toxic appearance. No distress.  HENT:  Head:  Normocephalic and atraumatic.  Eyes: Pupils are equal, round, and reactive to light. Conjunctivae, EOM and lids are normal.  Neck: Normal range of motion. Neck supple. No tracheal deviation present. No thyroid mass present.  Cardiovascular: Normal rate, regular rhythm and normal heart sounds. Exam reveals no gallop.  No murmur heard. Pulmonary/Chest: Effort normal and breath sounds normal. No stridor. No respiratory distress. She has no decreased breath sounds. She has no wheezes. She has no rhonchi. She has no rales.  Abdominal: Soft. Normal appearance and bowel sounds are normal. She exhibits no distension. There is tenderness in the epigastric area. There is no rigidity, no rebound, no guarding and no  CVA tenderness.    Musculoskeletal: Normal range of motion. She exhibits no edema or tenderness.  Neurological: She is alert and oriented to person, place, and time. She has normal strength. No cranial nerve deficit or sensory deficit. GCS eye subscore is 4. GCS verbal subscore is 5. GCS motor subscore is 6.  Skin: Skin is warm and dry. No abrasion and no rash noted.  Psychiatric: She has a normal mood and affect. Her speech is normal and behavior is normal.  Nursing note and vitals reviewed.    ED Treatments / Results  Labs (all labs ordered are listed, but only abnormal results are displayed) Labs Reviewed  LIPASE, BLOOD  COMPREHENSIVE METABOLIC PANEL  CBC  URINALYSIS, ROUTINE W REFLEX MICROSCOPIC    EKG None  Radiology No results found.  Procedures Procedures (including critical care time)  Medications Ordered in ED Medications  0.9 %  sodium chloride infusion (has no administration in time range)  HYDROmorphone (DILAUDID) injection 0.5 mg (has no administration in time range)  metoCLOPramide (REGLAN) injection 5 mg (has no administration in time range)  diphenhydrAMINE (BENADRYL) injection 12.5 mg (has no administration in time range)  0.9 %  sodium chloride infusion  (has no administration in time range)     Initial Impression / Assessment and Plan / ED Course  I have reviewed the triage vital signs and the nursing notes.  Pertinent labs & imaging results that were available during my care of the patient were reviewed by me and considered in my medical decision making (see chart for details).     Patient given IV fluids here and feels much better.  Her pain is been controlled here.  She is able to eat a meal here without emesis.  Patient has scheduled follow-up in 2 days  Final Clinical Impressions(s) / ED Diagnoses   Final diagnoses:  None    ED Discharge Orders    None       Lacretia Leigh, MD 07/24/18 1423    Lacretia Leigh, MD 07/24/18 1424

## 2018-07-26 ENCOUNTER — Inpatient Hospital Stay: Payer: Medicare Other

## 2018-07-26 ENCOUNTER — Other Ambulatory Visit: Payer: Self-pay | Admitting: *Deleted

## 2018-07-26 ENCOUNTER — Inpatient Hospital Stay (HOSPITAL_BASED_OUTPATIENT_CLINIC_OR_DEPARTMENT_OTHER): Payer: Medicare Other | Admitting: Nurse Practitioner

## 2018-07-26 ENCOUNTER — Ambulatory Visit
Admission: RE | Admit: 2018-07-26 | Discharge: 2018-07-26 | Disposition: A | Discharge status: Home / Self Care | Payer: Medicare Other | Source: Ambulatory Visit | Attending: Radiation Oncology | Admitting: Radiation Oncology

## 2018-07-26 ENCOUNTER — Encounter: Payer: Self-pay | Admitting: Nurse Practitioner

## 2018-07-26 ENCOUNTER — Telehealth: Payer: Self-pay | Admitting: Nurse Practitioner

## 2018-07-26 VITALS — BP 114/68 | HR 79 | Temp 98.2°F | Resp 20 | Ht 67.0 in | Wt 159.5 lb

## 2018-07-26 DIAGNOSIS — E860 Dehydration: Secondary | ICD-10-CM | POA: Diagnosis not present

## 2018-07-26 DIAGNOSIS — C20 Malignant neoplasm of rectum: Secondary | ICD-10-CM

## 2018-07-26 DIAGNOSIS — C189 Malignant neoplasm of colon, unspecified: Secondary | ICD-10-CM

## 2018-07-26 DIAGNOSIS — C787 Secondary malignant neoplasm of liver and intrahepatic bile duct: Principal | ICD-10-CM

## 2018-07-26 DIAGNOSIS — C187 Malignant neoplasm of sigmoid colon: Secondary | ICD-10-CM

## 2018-07-26 LAB — CBC WITH DIFFERENTIAL (CANCER CENTER ONLY)
BASOS ABS: 0 10*3/uL (ref 0.0–0.1)
Basophils Relative: 0 %
EOS ABS: 0 10*3/uL (ref 0.0–0.5)
EOS PCT: 0 %
HEMATOCRIT: 32.8 % — AB (ref 34.8–46.6)
Hemoglobin: 10.8 g/dL — ABNORMAL LOW (ref 11.6–15.9)
LYMPHS ABS: 0.3 10*3/uL — AB (ref 0.9–3.3)
LYMPHS PCT: 6 %
MCH: 27.2 pg (ref 25.1–34.0)
MCHC: 32.9 g/dL (ref 31.5–36.0)
MCV: 82.6 fL (ref 79.5–101.0)
MONO ABS: 0.6 10*3/uL (ref 0.1–0.9)
Monocytes Relative: 12 %
Neutro Abs: 3.9 10*3/uL (ref 1.5–6.5)
Neutrophils Relative %: 82 %
Platelet Count: 286 10*3/uL (ref 145–400)
RBC: 3.97 MIL/uL (ref 3.70–5.45)
RDW: 23.7 % — AB (ref 11.2–14.5)
WBC Count: 4.9 10*3/uL (ref 3.9–10.3)
nRBC: 1 /100 WBC — ABNORMAL HIGH

## 2018-07-26 LAB — CMP (CANCER CENTER ONLY)
ALK PHOS: 108 U/L (ref 38–126)
ALT: 8 U/L (ref 0–44)
AST: 17 U/L (ref 15–41)
Albumin: 2.4 g/dL — ABNORMAL LOW (ref 3.5–5.0)
Anion gap: 9 (ref 5–15)
BUN: 15 mg/dL (ref 8–23)
CALCIUM: 8.2 mg/dL — AB (ref 8.9–10.3)
CHLORIDE: 101 mmol/L (ref 98–111)
CO2: 23 mmol/L (ref 22–32)
CREATININE: 0.78 mg/dL (ref 0.44–1.00)
GFR, Est AFR Am: >60 mL/min (ref >60)
GFR, Estimated: >60 mL/min (ref >60)
GLUCOSE: 112 mg/dL — AB (ref 70–99)
Potassium: 4.4 mmol/L (ref 3.5–5.1)
SODIUM: 133 mmol/L — AB (ref 135–145)
Total Bilirubin: 0.9 mg/dL (ref 0.3–1.2)
Total Protein: 5.7 g/dL — ABNORMAL LOW (ref 6.5–8.1)

## 2018-07-26 MED ORDER — PANTOPRAZOLE SODIUM 40 MG PO TBEC: 40.0000 mg | DELAYED_RELEASE_TABLET | Freq: Every day | ORAL | 0 refills | Status: DC

## 2018-07-26 MED ORDER — FLUCONAZOLE 100 MG PO TABS: 100.0000 mg | ORAL_TABLET | Freq: Every day | ORAL | 0 refills | Status: DC

## 2018-07-26 MED ORDER — SODIUM CHLORIDE 0.9 % IV SOLN
INTRAVENOUS | Status: DC
  Administered 2018-07-26: 14:00:00 via INTRAVENOUS
  Filled 2018-07-26: qty 250

## 2018-07-26 MED ORDER — HYDROCODONE-ACETAMINOPHEN 7.5-325 MG/15ML PO SOLN: 15.0000 mL | Freq: Four times a day (QID) | ORAL | 0 refills | Status: DC | PRN

## 2018-07-26 NOTE — Progress Notes (Addendum)
[facility] OFFICE PROGRESS NOTE   Diagnosis: Colon cancer  INTERVAL HISTORY:   Nichole Cross returns as scheduled.  She continues Xeloda and radiation.  She was seen in the emergency department 08/03/2018 for evaluation of abdominal pain.  CT scan showed inflammatory changes and mild dilatation of multiple small bowel loops in the lower abdomen and pelvis; unchanged metastatic implant in the left paracolic gutter and unchanged periportal lymphadenopathy.  Unchanged 2.7 cm enhancing right renal mass.  She continues to have abdominal pain.  She is taking hydrocodone every 6 hours.  She is having frequent formed bowel movements.  No bleeding with bowel movements.  She has intermittent nausea.  No vomiting.  No mouth sores though she does feel like something is stuck in her throat and is having difficulty swallowing.  Oral intake is poor.  No hand or foot pain or redness.  Objective:  Vital signs in last 24 hours:  Blood pressure 114/68, pulse 79, temperature 98.2 F (36.8 C), temperature source Oral, resp. rate 20, height _0  (1.702 m), weight 159 lb 8 oz (72.3 kg), SpO2 99 %.    HEENT: No ulcers.  Mild white coating on tongue. Resp: Lungs clear bilaterally. Cardio: Regular rate and rhythm. GI: Abdomen is soft.  Tender at the left lower abdomen.  Bowel sounds active.  No hepatomegaly. Vascular: No leg edema. Skin: Palms without erythema.   Lab Results:  Lab Results  Component Value Date   WBC 4.9 07/26/2018   HGB 10.8 (L) 07/26/2018   HCT 32.8 (L) 07/26/2018   MCV 82.6 07/26/2018   PLT 286 07/26/2018   NEUTROABS 3.9 07/26/2018    Imaging:  No results found.  Medications: I have reviewed the patient's current medications.  Assessment/Plan: 1. Adenocarcinoma the distal sigmoid colon, poorly differentiated, stage II (T3 N0), status post a laparoscopic assisted sigmoid colectomy 08/09/2015  No loss of mismatch repair protein expression  Elevated preoperative  CEA  Staging CT of the abdomen and pelvis 06/22/2015 with no evidence of distant metastatic disease  Mildly elevated CEA 02/23/2017And 04/17/2016  CTs chest, abdomen, and pelvis on 06/04/2016, 1m left upper lobe nodule-no comparison available, new hypodense lesion in the left liver, solid Right renal mass  MRI 06/10/2016-2 enhancing lesions in the liver consistent with metastatic disease, segment 2 and segment 5. Solid enhancing right renal lesion consistent with a renal cell carcinoma  Radiofrequency ablation of 2 liver lesions on 07/25/2016  Cycle 1 adjuvant Xeloda 08/18/2016  Cycle 2 Xeloda 09/08/2016  CEA improved 09/26/2016  Cycle 3 Xeloda held 09/29/2016 due to unexplained abdominal pain,referred for CT  CT abdomen/pelvis 09/30/2016 with a long segment of bowel wall thickening involving the distal ileum; lesion left hepatic lobe similar to slightly smaller following ablation; lesion posterior right hepatic lobe elongated favored to represent treatment tract.  Cycle 3 Xeloda 10/10/2016 (dose reduced due to drug induced enteritis following cycle 2)discontinued 10/15/2016 secondary to abdominal pain.  CT in while the emergency room with abdominal pain 10/15/2016 revealed descending colitis  Colonoscopy 10/30/2016 confirmed a mass at the: colo-colonicanastomosis  Cycle 4 Xeloda 11/03/2016  01/09/2017 status post a low anterior resection for removal of locally recurrent tumor at the sigmoid anastomosis, resection margins negative, tumor invades pericolonic soft tissue, 12 benign lymph nodes;MSI stable  Cycle 5 Xeloda 02/12/2017  Cycle 6 Xeloda 03/12/2017, Xeloda dose reduced to 1000 mg twice daily on 03/18/2017  Cycle 7 Xeloda 04/09/2017  Cycle 8 Xeloda 05/12/2017  Restaging CTs 06/24/2017-no evidence of  metastatic disease involving the chest. Post ablation changes in the liver. No new hepatic metastatic disease.  Restaging CTs 12/14/2017- stable liver ablation sites,  indeterminate nodule adjacent to the descending colon, stable left renal mass, changes of early cirrhosis, enlarged porta hepatis node-likely reactive  MRI abdomen 04/22/2018- enlargement of 1.7 cm left paracolic gutter mass, stable 1 cm extrahepatic left liver capsule lesion, no evidence of local tumor recurrence at the ablation sites, no new liver lesions, stable right kidney mass  Colonoscopy 05/19/2018- mass in the rectum at the anastomotic site beginning at 3 cm from the anal verge, biopsy confirmed invasive adenocarcinoma  CT 06/07/2018-stable liver ablation sites, enlarging peritoneal lesion at the left pericolic gutter, stable nodule in the sigmoid mesocolon, stable prominent periportal lymph nodes, no evidence of metastatic disease to the chest  Radiation/Xeloda beginning 06/21/2018  Xeloda dose reduced to 1000 mg twice daily days of radiationbeginning8/16/2019 due to neutropenia   2.History ofMicrocytic anemia-likely iron deficiency anemia secondary to colon cancer, persistent microcytic anemia;ferrous sulfate increased to twice daily 12/15/2017;hemoglobin in normal range 03/16/2018, persistent microcytosis  3.Tubular villous adenoma on the sigmoid colon resection specimen 08/09/2015  Multiple tubular adenomas removed on a colonoscopy 11/16/2015  4. Right renal mass on CT 06/04/2016-suspicious for renal cell carcinoma;stable on CT 06/24/2017;referred to urology  5. History ofneutropenia secondary to chemotherapy  6.Pain secondary to local recurrence of colon cancer  7.   Abdominal pain.  CT abdomen/pelvis 07/24/2018- inflammatory changes and mild dilatation of multiple small bowel loops in the lower abdomen and pelvis.  Unchanged metastatic implant in the left paracolic gutter; unchanged periportal lymphadenopathy; hepatic steatosis; stable liver ablation site; unchanged 2.7 cm enhancing right renal mass.   Disposition: Nichole Cross continues Xeloda and radiation.   She was seen in the emergency department over the weekend for abdominal pain.  CT scan showed inflammation of the small bowel.  This may be related to radiation.  We gave her a liter of IV fluids in the office today and overall she felt better.  We will plan to give another liter of IV fluids on 07/28/2018.  She was provided with a new prescription for hydrocodone.  She appears to have oral candidiasis.  She will complete a 4-day course of Diflucan.  She will return for lab and follow-up in 1 week.  She will contact the office in the interim with any problems.  Patient seen with Dr. Benay Spice.  25 minutes were spent face-to-face at today's visit with the majority of that time involved in counseling/coordination of care.    [name] ANP/GNP-BC   07/26/2018  10:55 AM  This was a shared visit with [name].  Nichole Cross was interviewed and examined.  She has developed abdominal pain and failure to thrive while on Xeloda and radiation.  She does not appear to have significant Xeloda induced mucositis, diarrhea, or hand/foot syndrome.  The plan is to continue Xeloda and radiation.  She will receive IV fluids on 07/28/2018.  She will complete a course of Diflucan for candidiasis.  Julieanne Manson, MD

## 2018-07-26 NOTE — Telephone Encounter (Signed)
Scheduled appt per 9/9 los - unable to schedule IVF on 9/11 due to cap - logged - will contact patient when appts scheduled.

## 2018-07-27 ENCOUNTER — Telehealth: Payer: Self-pay | Admitting: Nurse Practitioner

## 2018-07-27 ENCOUNTER — Ambulatory Visit
Admission: RE | Admit: 2018-07-27 | Discharge: 2018-07-27 | Disposition: A | Discharge status: Home / Self Care | Payer: Medicare Other | Source: Ambulatory Visit | Attending: Radiation Oncology | Admitting: Radiation Oncology

## 2018-07-27 DIAGNOSIS — C20 Malignant neoplasm of rectum: Secondary | ICD-10-CM | POA: Diagnosis not present

## 2018-07-27 NOTE — Telephone Encounter (Signed)
Scheduled appt per 9/09 los - left message for patient with appt date and time.

## 2018-07-28 ENCOUNTER — Inpatient Hospital Stay: Payer: Medicare Other

## 2018-07-28 ENCOUNTER — Ambulatory Visit
Admission: RE | Admit: 2018-07-28 | Discharge: 2018-07-28 | Disposition: A | Discharge status: Home / Self Care | Payer: Medicare Other | Source: Ambulatory Visit | Attending: Radiation Oncology | Admitting: Radiation Oncology

## 2018-07-28 DIAGNOSIS — C20 Malignant neoplasm of rectum: Secondary | ICD-10-CM | POA: Diagnosis not present

## 2018-07-28 DIAGNOSIS — C189 Malignant neoplasm of colon, unspecified: Secondary | ICD-10-CM

## 2018-07-28 DIAGNOSIS — C787 Secondary malignant neoplasm of liver and intrahepatic bile duct: Principal | ICD-10-CM

## 2018-07-28 MED ORDER — SODIUM CHLORIDE 0.9 % IV SOLN
INTRAVENOUS | Status: AC
  Administered 2018-07-28: 10:00:00 via INTRAVENOUS
  Filled 2018-07-28 (×2): qty 250

## 2018-07-28 NOTE — Patient Instructions (Addendum)
Dehydration, Adult Dehydration is when there is not enough fluid or water in your body. This happens when you lose more fluids than you take in. Dehydration can range from mild to very bad. It should be treated right away to keep it from getting very bad. Symptoms of mild dehydration may include:  Thirst.  Dry lips.  Slightly dry mouth.  Dry, warm skin.  Dizziness. Symptoms of moderate dehydration may include:  Very dry mouth.  Muscle cramps.  Dark pee (urine). Pee may be the color of tea.  Your body making less pee.  Your eyes making fewer tears.  Heartbeat that is uneven or faster than normal (palpitations).  Headache.  Light-headedness, especially when you stand up from sitting.  Fainting (syncope). Symptoms of very bad dehydration may include:  Changes in skin, such as: ? Cold and clammy skin. ? Blotchy (mottled) or pale skin. ? Skin that does not quickly return to normal after being lightly pinched and let go (poor skin turgor).  Changes in body fluids, such as: ? Feeling very thirsty. ? Your eyes making fewer tears. ? Not sweating when body temperature is high, such as in hot weather. ? Your body making very little pee.  Changes in vital signs, such as: ? Weak pulse. ? Pulse that is more than 100 beats a minute when you are sitting still. ? Fast breathing. ? Low blood pressure.  Other changes, such as: ? Sunken eyes. ? Cold hands and feet. ? Confusion. ? Lack of energy (lethargy). ? Trouble waking up from sleep. ? Short-term weight loss. ? Unconsciousness. Follow these instructions at home:  If told by your doctor, drink an ORS: ? Make an ORS by using instructions on the package. ? Start by drinking small amounts, about  cup (120 mL) every 5-10 minutes. ? Slowly drink more until you have had the amount that your doctor said to have.  Drink enough clear fluid to keep your pee clear or pale yellow. If you were told to drink an ORS, finish the ORS  first, then start slowly drinking clear fluids. Drink fluids such as: ? Water. Do not drink only water by itself. Doing that can make the salt (sodium) level in your body get too low (hyponatremia). ? Ice chips. ? Fruit juice that you have added water to (diluted). ? Low-calorie sports drinks.  Avoid: ? Alcohol. ? Drinks that have a lot of sugar. These include high-calorie sports drinks, fruit juice that does not have water added, and soda. ? Caffeine. ? Foods that are greasy or have a lot of fat or sugar.  Take over-the-counter and prescription medicines only as told by your doctor.  Do not take salt tablets. Doing that can make the salt level in your body get too high (hypernatremia).  Eat foods that have minerals (electrolytes). Examples include bananas, oranges, potatoes, tomatoes, and spinach.  Keep all follow-up visits as told by your doctor. This is important. Contact a doctor if:  You have belly (abdominal) pain that: ? Gets worse. ? Stays in one area (localizes).  You have a rash.  You have a stiff neck.  You get angry or annoyed more easily than normal (irritability).  You are more sleepy than normal.  You have a harder time waking up than normal.  You feel: ? Weak. ? Dizzy. ? Very thirsty.  You have peed (urinated) only a small amount of very dark pee during 6-8 hours. Get help right away if:  You have symptoms of   very bad dehydration.  You cannot drink fluids without throwing up (vomiting).  Your symptoms get worse with treatment.  You have a fever.  You have a very bad headache.  You are throwing up or having watery poop (diarrhea) and it: ? Gets worse. ? Does not go away.  You have blood or something green (bile) in your throw-up.  You have blood in your poop (stool). This may cause poop to look black and tarry.  You have not peed in 6-8 hours.  You pass out (faint).  Your heart rate when you are sitting still is more than 100 beats a  minute.  You have trouble breathing. This information is not intended to replace advice given to you by your health care provider. Make sure you discuss any questions you have with your health care provider. Document Released: 08/30/2009 Document Revised: 05/23/2016 Document Reviewed: 12/28/2015 Elsevier Interactive Patient Education  2018 [employer]  Dehydration, Adult Dehydration is when there is not enough fluid or water in your body. This happens when you lose more fluids than you take in. Dehydration can range from mild to very bad. It should be treated right away to keep it from getting very bad. Symptoms of mild dehydration may include:  Thirst.  Dry lips.  Slightly dry mouth.  Dry, warm skin.  Dizziness. Symptoms of moderate dehydration may include:  Very dry mouth.  Muscle cramps.  Dark pee (urine). Pee may be the color of tea.  Your body making less pee.  Your eyes making fewer tears.  Heartbeat that is uneven or faster than normal (palpitations).  Headache.  Light-headedness, especially when you stand up from sitting.  Fainting (syncope). Symptoms of very bad dehydration may include:  Changes in skin, such as: ? Cold and clammy skin. ? Blotchy (mottled) or pale skin. ? Skin that does not quickly return to normal after being lightly pinched and let go (poor skin turgor).  Changes in body fluids, such as: ? Feeling very thirsty. ? Your eyes making fewer tears. ? Not sweating when body temperature is high, such as in hot weather. ? Your body making very little pee.  Changes in vital signs, such as: ? Weak pulse. ? Pulse that is more than 100 beats a minute when you are sitting still. ? Fast breathing. ? Low blood pressure.  Other changes, such as: ? Sunken eyes. ? Cold hands and feet. ? Confusion. ? Lack of energy (lethargy). ? Trouble waking up from sleep. ? Short-term weight loss. ? Unconsciousness. Follow these instructions at  home:  If told by your doctor, drink an ORS: ? Make an ORS by using instructions on the package. ? Start by drinking small amounts, about  cup (120 mL) every 5-10 minutes. ? Slowly drink more until you have had the amount that your doctor said to have.  Drink enough clear fluid to keep your pee clear or pale yellow. If you were told to drink an ORS, finish the ORS first, then start slowly drinking clear fluids. Drink fluids such as: ? Water. Do not drink only water by itself. Doing that can make the salt (sodium) level in your body get too low (hyponatremia). ? Ice chips. ? Fruit juice that you have added water to (diluted). ? Low-calorie sports drinks.  Avoid: ? Alcohol. ? Drinks that have a lot of sugar. These include high-calorie sports drinks, fruit juice that does not have water added, and soda. ? Caffeine. ? Foods that are greasy or have   a lot of fat or sugar.  Take over-the-counter and prescription medicines only as told by your doctor.  Do not take salt tablets. Doing that can make the salt level in your body get too high (hypernatremia).  Eat foods that have minerals (electrolytes). Examples include bananas, oranges, potatoes, tomatoes, and spinach.  Keep all follow-up visits as told by your doctor. This is important. Contact a doctor if:  You have belly (abdominal) pain that: ? Gets worse. ? Stays in one area (localizes).  You have a rash.  You have a stiff neck.  You get angry or annoyed more easily than normal (irritability).  You are more sleepy than normal.  You have a harder time waking up than normal.  You feel: ? Weak. ? Dizzy. ? Very thirsty.  You have peed (urinated) only a small amount of very dark pee during 6-8 hours. Get help right away if:  You have symptoms of very bad dehydration.  You cannot drink fluids without throwing up (vomiting).  Your symptoms get worse with treatment.  You have a fever.  You have a very bad headache.  You  are throwing up or having watery poop (diarrhea) and it: ? Gets worse. ? Does not go away.  You have blood or something green (bile) in your throw-up.  You have blood in your poop (stool). This may cause poop to look black and tarry.  You have not peed in 6-8 hours.  You pass out (faint).  Your heart rate when you are sitting still is more than 100 beats a minute.  You have trouble breathing. This information is not intended to replace advice given to you by your health care provider. Make sure you discuss any questions you have with your health care provider. Document Released: 08/30/2009 Document Revised: 05/23/2016 Document Reviewed: 12/28/2015 Elsevier Interactive Patient Education  2018 Elsevier Inc.  

## 2018-07-29 ENCOUNTER — Ambulatory Visit
Admission: RE | Admit: 2018-07-29 | Discharge: 2018-07-29 | Disposition: A | Discharge status: Home / Self Care | Payer: Medicare Other | Source: Ambulatory Visit | Attending: Radiation Oncology | Admitting: Radiation Oncology

## 2018-07-29 DIAGNOSIS — C20 Malignant neoplasm of rectum: Secondary | ICD-10-CM | POA: Diagnosis not present

## 2018-07-30 ENCOUNTER — Ambulatory Visit
Admission: RE | Admit: 2018-07-30 | Discharge: 2018-07-30 | Disposition: A | Discharge status: Home / Self Care | Payer: Medicare Other | Source: Ambulatory Visit | Attending: Radiation Oncology | Admitting: Radiation Oncology

## 2018-07-30 DIAGNOSIS — C20 Malignant neoplasm of rectum: Secondary | ICD-10-CM | POA: Diagnosis not present

## 2018-08-02 ENCOUNTER — Inpatient Hospital Stay: Payer: Medicare Other

## 2018-08-02 ENCOUNTER — Encounter: Payer: Self-pay | Admitting: Nurse Practitioner

## 2018-08-02 ENCOUNTER — Ambulatory Visit
Admission: RE | Admit: 2018-08-02 | Discharge: 2018-08-02 | Disposition: A | Discharge status: Home / Self Care | Payer: Medicare Other | Source: Ambulatory Visit | Attending: Radiation Oncology | Admitting: Radiation Oncology

## 2018-08-02 ENCOUNTER — Telehealth: Payer: Self-pay | Admitting: Nurse Practitioner

## 2018-08-02 ENCOUNTER — Inpatient Hospital Stay (HOSPITAL_BASED_OUTPATIENT_CLINIC_OR_DEPARTMENT_OTHER): Payer: Medicare Other | Admitting: Nurse Practitioner

## 2018-08-02 ENCOUNTER — Encounter: Payer: Self-pay | Admitting: Radiation Oncology

## 2018-08-02 VITALS — BP 92/57 | HR 86 | Temp 97.7°F | Resp 18 | Ht 67.0 in | Wt 156.0 lb

## 2018-08-02 DIAGNOSIS — D701 Agranulocytosis secondary to cancer chemotherapy: Secondary | ICD-10-CM

## 2018-08-02 DIAGNOSIS — D509 Iron deficiency anemia, unspecified: Secondary | ICD-10-CM | POA: Diagnosis not present

## 2018-08-02 DIAGNOSIS — C787 Secondary malignant neoplasm of liver and intrahepatic bile duct: Principal | ICD-10-CM

## 2018-08-02 DIAGNOSIS — C187 Malignant neoplasm of sigmoid colon: Secondary | ICD-10-CM

## 2018-08-02 DIAGNOSIS — C189 Malignant neoplasm of colon, unspecified: Secondary | ICD-10-CM

## 2018-08-02 DIAGNOSIS — C20 Malignant neoplasm of rectum: Secondary | ICD-10-CM | POA: Diagnosis not present

## 2018-08-02 LAB — CMP (CANCER CENTER ONLY)
ALT: 6 U/L (ref 0–44)
AST: 16 U/L (ref 15–41)
Albumin: 2.1 g/dL — ABNORMAL LOW (ref 3.5–5.0)
Alkaline Phosphatase: 137 U/L — ABNORMAL HIGH (ref 38–126)
Anion gap: 10 (ref 5–15)
BUN: 16 mg/dL (ref 8–23)
CHLORIDE: 98 mmol/L (ref 98–111)
CO2: 23 mmol/L (ref 22–32)
CREATININE: 0.87 mg/dL (ref 0.44–1.00)
Calcium: 7.9 mg/dL — ABNORMAL LOW (ref 8.9–10.3)
GFR, Est AFR Am: >60 mL/min (ref >60)
GFR, Estimated: >60 mL/min (ref >60)
GLUCOSE: 121 mg/dL — AB (ref 70–99)
Potassium: 4.2 mmol/L (ref 3.5–5.1)
SODIUM: 131 mmol/L — AB (ref 135–145)
Total Bilirubin: 0.9 mg/dL (ref 0.3–1.2)
Total Protein: 5.6 g/dL — ABNORMAL LOW (ref 6.5–8.1)

## 2018-08-02 LAB — CBC WITH DIFFERENTIAL (CANCER CENTER ONLY)
Basophils Absolute: 0 10*3/uL (ref 0.0–0.1)
Basophils Relative: 0 %
EOS ABS: 0 10*3/uL (ref 0.0–0.5)
EOS PCT: 0 %
HCT: 34.5 % — ABNORMAL LOW (ref 34.8–46.6)
HEMOGLOBIN: 11.1 g/dL — AB (ref 11.6–15.9)
LYMPHS ABS: 0.1 10*3/uL — AB (ref 0.9–3.3)
Lymphocytes Relative: 4 %
MCH: 28.2 pg (ref 25.1–34.0)
MCHC: 32.3 g/dL (ref 31.5–36.0)
MCV: 87.2 fL (ref 79.5–101.0)
MONOS PCT: 9 %
Monocytes Absolute: 0.2 10*3/uL (ref 0.1–0.9)
Neutro Abs: 2 10*3/uL (ref 1.5–6.5)
Neutrophils Relative %: 87 %
PLATELETS: 147 10*3/uL (ref 145–400)
RBC: 3.95 MIL/uL (ref 3.70–5.45)
RDW: 30.4 % — ABNORMAL HIGH (ref 11.2–14.5)
WBC Count: 2.3 10*3/uL — ABNORMAL LOW (ref 3.9–10.3)

## 2018-08-02 LAB — CEA (IN HOUSE-CHCC): CEA (CHCC-In House): 10.95 ng/mL — ABNORMAL HIGH (ref 0.00–5.00)

## 2018-08-02 MED ORDER — HYDROCODONE-ACETAMINOPHEN 7.5-325 MG/15ML PO SOLN: 15.0000 mL | Freq: Four times a day (QID) | ORAL | 0 refills | Status: DC | PRN

## 2018-08-02 MED ORDER — SODIUM CHLORIDE 0.9 % IV SOLN
INTRAVENOUS | Status: AC
  Administered 2018-08-02: 12:00:00 via INTRAVENOUS
  Filled 2018-08-02 (×2): qty 250

## 2018-08-02 NOTE — Patient Instructions (Signed)
Dehydration, Adult Dehydration is when there is not enough fluid or water in your body. This happens when you lose more fluids than you take in. Dehydration can range from mild to very bad. It should be treated right away to keep it from getting very bad. Symptoms of mild dehydration may include:  Thirst.  Dry lips.  Slightly dry mouth.  Dry, warm skin.  Dizziness. Symptoms of moderate dehydration may include:  Very dry mouth.  Muscle cramps.  Dark pee (urine). Pee may be the color of tea.  Your body making less pee.  Your eyes making fewer tears.  Heartbeat that is uneven or faster than normal (palpitations).  Headache.  Light-headedness, especially when you stand up from sitting.  Fainting (syncope). Symptoms of very bad dehydration may include:  Changes in skin, such as: ? Cold and clammy skin. ? Blotchy (mottled) or pale skin. ? Skin that does not quickly return to normal after being lightly pinched and let go (poor skin turgor).  Changes in body fluids, such as: ? Feeling very thirsty. ? Your eyes making fewer tears. ? Not sweating when body temperature is high, such as in hot weather. ? Your body making very little pee.  Changes in vital signs, such as: ? Weak pulse. ? Pulse that is more than 100 beats a minute when you are sitting still. ? Fast breathing. ? Low blood pressure.  Other changes, such as: ? Sunken eyes. ? Cold hands and feet. ? Confusion. ? Lack of energy (lethargy). ? Trouble waking up from sleep. ? Short-term weight loss. ? Unconsciousness. Follow these instructions at home:  If told by your doctor, drink an ORS: ? Make an ORS by using instructions on the package. ? Start by drinking small amounts, about  cup (120 mL) every 5-10 minutes. ? Slowly drink more until you have had the amount that your doctor said to have.  Drink enough clear fluid to keep your pee clear or pale yellow. If you were told to drink an ORS, finish the ORS  first, then start slowly drinking clear fluids. Drink fluids such as: ? Water. Do not drink only water by itself. Doing that can make the salt (sodium) level in your body get too low (hyponatremia). ? Ice chips. ? Fruit juice that you have added water to (diluted). ? Low-calorie sports drinks.  Avoid: ? Alcohol. ? Drinks that have a lot of sugar. These include high-calorie sports drinks, fruit juice that does not have water added, and soda. ? Caffeine. ? Foods that are greasy or have a lot of fat or sugar.  Take over-the-counter and prescription medicines only as told by your doctor.  Do not take salt tablets. Doing that can make the salt level in your body get too high (hypernatremia).  Eat foods that have minerals (electrolytes). Examples include bananas, oranges, potatoes, tomatoes, and spinach.  Keep all follow-up visits as told by your doctor. This is important. Contact a doctor if:  You have belly (abdominal) pain that: ? Gets worse. ? Stays in one area (localizes).  You have a rash.  You have a stiff neck.  You get angry or annoyed more easily than normal (irritability).  You are more sleepy than normal.  You have a harder time waking up than normal.  You feel: ? Weak. ? Dizzy. ? Very thirsty.  You have peed (urinated) only a small amount of very dark pee during 6-8 hours. Get help right away if:  You have symptoms of   very bad dehydration.  You cannot drink fluids without throwing up (vomiting).  Your symptoms get worse with treatment.  You have a fever.  You have a very bad headache.  You are throwing up or having watery poop (diarrhea) and it: ? Gets worse. ? Does not go away.  You have blood or something green (bile) in your throw-up.  You have blood in your poop (stool). This may cause poop to look black and tarry.  You have not peed in 6-8 hours.  You pass out (faint).  Your heart rate when you are sitting still is more than 100 beats a  minute.  You have trouble breathing. This information is not intended to replace advice given to you by your health care provider. Make sure you discuss any questions you have with your health care provider. Document Released: 08/30/2009 Document Revised: 05/23/2016 Document Reviewed: 12/28/2015 Elsevier Interactive Patient Education  2018 [employer]  

## 2018-08-02 NOTE — Telephone Encounter (Signed)
Scheduled appt per 9/16 los - unabel to schedule fluids on 9/18 due to cap - logged - will call patient when all appts scheduled.

## 2018-08-02 NOTE — Progress Notes (Addendum)
[place] OFFICE PROGRESS NOTE   Diagnosis: Colon cancer  INTERVAL HISTORY:   Nichole Cross returns as scheduled.  She completed the final radiation treatment earlier today.  She will take the final dose of Xeloda this evening.  She continues to have abdominal pain.  She is taking hydrocodone about every 6 hours.  No mouth sores.  No diarrhea.  No nausea or vomiting.  No lightheadedness or dizziness.  She is no longer having dysphagia.  She is tolerating fluids.  She felt better following the IV fluids last week and would like additional IV fluids today.  Objective:  Vital signs in last 24 hours:  Blood pressure (!) 90/54, pulse 86, temperature 97.7 F (36.5 C), temperature source Oral, resp. rate 18, height '5\' 7"'  (1.702 m), weight 156 lb (70.8 kg), SpO2 98 %.    HEENT: No thrush or ulcers. Resp: Lungs clear bilaterally. Cardio: Regular rate and rhythm. GI: Abdomen soft.  Tender at the left mid abdomen. Vascular: No leg edema. Neuro: Alert and oriented. Skin: Palms without erythema.   Lab Results:  Lab Results  Component Value Date   WBC 2.3 (L) 08/02/2018   HGB 11.1 (L) 08/02/2018   HCT 34.5 (L) 08/02/2018   MCV 87.2 08/02/2018   PLT 147 08/02/2018   NEUTROABS 2.0 08/02/2018    Imaging:  No results found.  Medications: I have reviewed the patient's current medications.  Assessment/Plan: 1. Adenocarcinoma the distal sigmoid colon, poorly differentiated, stage II (T3 N0), status post a laparoscopic assisted sigmoid colectomy 08/09/2015  No loss of mismatch repair protein expression  Elevated preoperative CEA  Staging CT of the abdomen and pelvis 06/22/2015 with no evidence of distant metastatic disease  Mildly elevated CEA 02/23/2017And 04/17/2016  CTs chest, abdomen, and pelvis on 06/04/2016, 56m left upper lobe nodule-no comparison available, new hypodense lesion in the left liver, solid Right renal mass  MRI 06/10/2016-2 enhancing lesions in  the liver consistent with metastatic disease, segment 2 and segment 5. Solid enhancing right renal lesion consistent with a renal cell carcinoma  Radiofrequency ablation of 2 liver lesions on 07/25/2016  Cycle 1 adjuvant Xeloda 08/18/2016  Cycle 2 Xeloda 09/08/2016  CEA improved 09/26/2016  Cycle 3 Xeloda held 09/29/2016 due to unexplained abdominal pain,referred for CT  CT abdomen/pelvis 09/30/2016 with a long segment of bowel wall thickening involving the distal ileum; lesion left hepatic lobe similar to slightly smaller following ablation; lesion posterior right hepatic lobe elongated favored to represent treatment tract.  Cycle 3 Xeloda 10/10/2016 (dose reduced due to drug induced enteritis following cycle 2)discontinued 10/15/2016 secondary to abdominal pain.  CT in while the emergency room with abdominal pain 10/15/2016 revealed descending colitis  Colonoscopy 10/30/2016 confirmed a mass at the: colo-colonicanastomosis  Cycle 4 Xeloda 11/03/2016  01/09/2017 status post a low anterior resection for removal of locally recurrent tumor at the sigmoid anastomosis, resection margins negative, tumor invades pericolonic soft tissue, 12 benign lymph nodes;MSI stable  Cycle 5 Xeloda 02/12/2017  Cycle 6 Xeloda 03/12/2017, Xeloda dose reduced to 1000 mg twice daily on 03/18/2017  Cycle 7 Xeloda 04/09/2017  Cycle 8 Xeloda 05/12/2017  Restaging CTs 06/24/2017-no evidence of metastatic disease involving the chest. Post ablation changes in the liver. No new hepatic metastatic disease.  Restaging CTs 12/14/2017- stable liver ablation sites, indeterminate nodule adjacent to the descending colon, stable left renal mass, changes of early cirrhosis, enlarged porta hepatis node-likely reactive  MRI abdomen 04/22/2018- enlargement of 1.7 cm left paracolic gutter mass,  stable 1 cm extrahepatic left liver capsule lesion, no evidence of local tumor recurrence at the ablation sites, no new liver  lesions, stable right kidney mass  Colonoscopy 05/19/2018- mass in the rectum at the anastomotic site beginning at 3 cm from the anal verge, biopsy confirmed invasive adenocarcinoma  CT 06/07/2018-stable liver ablation sites, enlarging peritoneal lesion at the left pericolic gutter, stable nodule in the sigmoid mesocolon, stable prominent periportal lymph nodes, no evidence of metastatic disease to the chest  Radiation/Xeloda beginning 06/21/2018  Xeloda dose reduced to 1000 mg twice daily days of radiationbeginning8/16/2019 due to neutropenia  Radiation/Xeloda completed 08/02/2018   2.History ofMicrocytic anemia-likely iron deficiency anemia secondary to colon cancer, persistent microcytic anemia;ferrous sulfate increased to twice daily 12/15/2017;hemoglobin in normal range 03/16/2018, persistent microcytosis  3.Tubular villous adenoma on the sigmoid colon resection specimen 08/09/2015  Multiple tubular adenomas removed on a colonoscopy 11/16/2015  4. Right renal mass on CT 06/04/2016-suspicious for renal cell carcinoma;stable on CT 06/24/2017;referred to urology  5. History ofneutropenia secondary to chemotherapy  6.Pain secondary to local recurrence of colon cancer  7.   Abdominal pain.  CT abdomen/pelvis 07/24/2018- inflammatory changes and mild dilatation of multiple small bowel loops in the lower abdomen and pelvis.  Unchanged metastatic implant in the left paracolic gutter; unchanged periportal lymphadenopathy; hepatic steatosis; stable liver ablation site; unchanged 2.7 cm enhancing right renal mass.   Disposition: Nichole Cross appears unchanged.  She completed the final radiation treatment today.  She will take the final dose of Xeloda this evening.  CEA done earlier today is better.  She continues to have abdominal pain, no diarrhea.  Hopefully this will improve as she recovers from radiation/Xeloda.  She will continue hydrocodone liquid as needed.  A new  prescription was sent to her pharmacy.    She is hypotensive likely due to dehydration.  She will receive a liter of IV fluids in the office today with repeat vitals.  She will also receive IV fluids later this week.  She will return for a follow-up visit next week.  She will contact the office in the interim with any problems.  Patient seen with Dr. Benay Spice.      [name] ANP/GNP-BC   08/02/2018  11:36 AM  This was a shared visit with [name].  Ms. Ringstad was interviewed and examined.  She has completed the course of Xeloda and radiation.  She continues to have a poor appetite and abdominal pain.  Hopefully the symptoms will improve over the next 2 weeks.  She and her family will contact us if her performance status does not improve.  Julieanne Manson, MD

## 2018-08-05 ENCOUNTER — Inpatient Hospital Stay: Payer: Medicare Other

## 2018-08-05 ENCOUNTER — Telehealth: Payer: Self-pay

## 2018-08-05 VITALS — BP 95/65 | HR 95 | Temp 97.8°F | Resp 16

## 2018-08-05 DIAGNOSIS — C189 Malignant neoplasm of colon, unspecified: Secondary | ICD-10-CM

## 2018-08-05 DIAGNOSIS — B370 Candidal stomatitis: Secondary | ICD-10-CM | POA: Insufficient documentation

## 2018-08-05 DIAGNOSIS — Z79899 Other long term (current) drug therapy: Secondary | ICD-10-CM | POA: Insufficient documentation

## 2018-08-05 DIAGNOSIS — C187 Malignant neoplasm of sigmoid colon: Secondary | ICD-10-CM | POA: Insufficient documentation

## 2018-08-05 DIAGNOSIS — G893 Neoplasm related pain (acute) (chronic): Secondary | ICD-10-CM | POA: Insufficient documentation

## 2018-08-05 DIAGNOSIS — E038 Other specified hypothyroidism: Secondary | ICD-10-CM

## 2018-08-05 DIAGNOSIS — E860 Dehydration: Secondary | ICD-10-CM | POA: Insufficient documentation

## 2018-08-05 DIAGNOSIS — C787 Secondary malignant neoplasm of liver and intrahepatic bile duct: Principal | ICD-10-CM

## 2018-08-05 MED ORDER — SODIUM CHLORIDE 0.9 % IV SOLN
INTRAVENOUS | Status: DC
  Filled 2018-08-05 (×2): qty 250

## 2018-08-05 NOTE — Progress Notes (Signed)
Patient taken to lobby via wheelchair and out to vehicle driven by her son.

## 2018-08-05 NOTE — Telephone Encounter (Signed)
Scheduled patient per 9/19 Melanie request to [facility] for iv fluids

## 2018-08-12 ENCOUNTER — Inpatient Hospital Stay: Payer: Medicare Other | Admitting: Nurse Practitioner

## 2018-08-19 ENCOUNTER — Inpatient Hospital Stay: Payer: Medicare Other

## 2018-08-19 ENCOUNTER — Inpatient Hospital Stay: Payer: Medicare Other | Attending: Oncology | Admitting: Medical

## 2018-08-19 ENCOUNTER — Other Ambulatory Visit: Payer: Self-pay | Admitting: Emergency Medicine

## 2018-08-19 VITALS — BP 100/70 | HR 78 | Temp 98.0°F | Resp 18 | Ht 67.0 in | Wt 145.5 lb

## 2018-08-19 DIAGNOSIS — Z23 Encounter for immunization: Secondary | ICD-10-CM | POA: Insufficient documentation

## 2018-08-19 DIAGNOSIS — C189 Malignant neoplasm of colon, unspecified: Secondary | ICD-10-CM

## 2018-08-19 DIAGNOSIS — Z85048 Personal history of other malignant neoplasm of rectum, rectosigmoid junction, and anus: Secondary | ICD-10-CM

## 2018-08-19 DIAGNOSIS — B370 Candidal stomatitis: Secondary | ICD-10-CM

## 2018-08-19 DIAGNOSIS — Z85038 Personal history of other malignant neoplasm of large intestine: Secondary | ICD-10-CM | POA: Diagnosis present

## 2018-08-19 DIAGNOSIS — C787 Secondary malignant neoplasm of liver and intrahepatic bile duct: Principal | ICD-10-CM

## 2018-08-19 DIAGNOSIS — E860 Dehydration: Secondary | ICD-10-CM | POA: Diagnosis present

## 2018-08-19 DIAGNOSIS — D509 Iron deficiency anemia, unspecified: Secondary | ICD-10-CM | POA: Diagnosis not present

## 2018-08-19 DIAGNOSIS — C20 Malignant neoplasm of rectum: Secondary | ICD-10-CM

## 2018-08-19 LAB — CMP (CANCER CENTER ONLY)
ALBUMIN: 2.2 g/dL — AB (ref 3.5–5.0)
ALT: 9 U/L (ref 0–44)
ANION GAP: 7 (ref 5–15)
AST: 20 U/L (ref 15–41)
Alkaline Phosphatase: 171 U/L — ABNORMAL HIGH (ref 38–126)
BUN: 10 mg/dL (ref 8–23)
CALCIUM: 8.1 mg/dL — AB (ref 8.9–10.3)
CO2: 24 mmol/L (ref 22–32)
Chloride: 103 mmol/L (ref 98–111)
Creatinine: 0.98 mg/dL (ref 0.44–1.00)
GFR, Estimated: 55 mL/min — ABNORMAL LOW (ref >60)
Glucose, Bld: 118 mg/dL — ABNORMAL HIGH (ref 70–99)
Potassium: 3.6 mmol/L (ref 3.5–5.1)
SODIUM: 134 mmol/L — AB (ref 135–145)
Total Bilirubin: 0.6 mg/dL (ref 0.3–1.2)
Total Protein: 6.5 g/dL (ref 6.5–8.1)

## 2018-08-19 LAB — CBC WITH DIFFERENTIAL (CANCER CENTER ONLY)
BASOS ABS: 0 10*3/uL (ref 0.0–0.1)
Basophils Relative: 1 %
EOS ABS: 0 10*3/uL (ref 0.0–0.5)
Eosinophils Relative: 1 %
HCT: 31.1 % — ABNORMAL LOW (ref 34.8–46.6)
HEMOGLOBIN: 10.3 g/dL — AB (ref 11.6–15.9)
LYMPHS ABS: 0.4 10*3/uL — AB (ref 0.9–3.3)
Lymphocytes Relative: 16 %
MCH: 29.2 pg (ref 25.1–34.0)
MCHC: 33.1 g/dL (ref 31.5–36.0)
MCV: 88.5 fL (ref 79.5–101.0)
Monocytes Absolute: 0.2 10*3/uL (ref 0.1–0.9)
Monocytes Relative: 6 %
NEUTROS PCT: 76 %
Neutro Abs: 1.9 10*3/uL (ref 1.5–6.5)
Platelet Count: 286 10*3/uL (ref 145–400)
RBC: 3.52 MIL/uL — ABNORMAL LOW (ref 3.70–5.45)
RDW: 27.7 % — ABNORMAL HIGH (ref 11.2–14.5)
WBC Count: 2.5 10*3/uL — ABNORMAL LOW (ref 3.9–10.3)

## 2018-08-19 MED ORDER — SODIUM CHLORIDE 0.9 % IV SOLN
Freq: Once | INTRAVENOUS | Status: AC
  Administered 2018-08-19: 15:00:00 via INTRAVENOUS
  Filled 2018-08-19: qty 250

## 2018-08-19 MED ORDER — FLUCONAZOLE 100 MG PO TABS: 100.0000 mg | ORAL_TABLET | Freq: Every day | ORAL | 0 refills | Status: DC

## 2018-08-19 NOTE — Patient Instructions (Signed)
Dehydration, Adult Dehydration is when there is not enough fluid or water in your body. This happens when you lose more fluids than you take in. Dehydration can range from mild to very bad. It should be treated right away to keep it from getting very bad. Symptoms of mild dehydration may include:  Thirst.  Dry lips.  Slightly dry mouth.  Dry, warm skin.  Dizziness. Symptoms of moderate dehydration may include:  Very dry mouth.  Muscle cramps.  Dark pee (urine). Pee may be the color of tea.  Your body making less pee.  Your eyes making fewer tears.  Heartbeat that is uneven or faster than normal (palpitations).  Headache.  Light-headedness, especially when you stand up from sitting.  Fainting (syncope). Symptoms of very bad dehydration may include:  Changes in skin, such as: ? Cold and clammy skin. ? Blotchy (mottled) or pale skin. ? Skin that does not quickly return to normal after being lightly pinched and let go (poor skin turgor).  Changes in body fluids, such as: ? Feeling very thirsty. ? Your eyes making fewer tears. ? Not sweating when body temperature is high, such as in hot weather. ? Your body making very little pee.  Changes in vital signs, such as: ? Weak pulse. ? Pulse that is more than 100 beats a minute when you are sitting still. ? Fast breathing. ? Low blood pressure.  Other changes, such as: ? Sunken eyes. ? Cold hands and feet. ? Confusion. ? Lack of energy (lethargy). ? Trouble waking up from sleep. ? Short-term weight loss. ? Unconsciousness. Follow these instructions at home:  If told by your doctor, drink an ORS: ? Make an ORS by using instructions on the package. ? Start by drinking small amounts, about  cup (120 mL) every 5-10 minutes. ? Slowly drink more until you have had the amount that your doctor said to have.  Drink enough clear fluid to keep your pee clear or pale yellow. If you were told to drink an ORS, finish the ORS  first, then start slowly drinking clear fluids. Drink fluids such as: ? Water. Do not drink only water by itself. Doing that can make the salt (sodium) level in your body get too low (hyponatremia). ? Ice chips. ? Fruit juice that you have added water to (diluted). ? Low-calorie sports drinks.  Avoid: ? Alcohol. ? Drinks that have a lot of sugar. These include high-calorie sports drinks, fruit juice that does not have water added, and soda. ? Caffeine. ? Foods that are greasy or have a lot of fat or sugar.  Take over-the-counter and prescription medicines only as told by your doctor.  Do not take salt tablets. Doing that can make the salt level in your body get too high (hypernatremia).  Eat foods that have minerals (electrolytes). Examples include bananas, oranges, potatoes, tomatoes, and spinach.  Keep all follow-up visits as told by your doctor. This is important. Contact a doctor if:  You have belly (abdominal) pain that: ? Gets worse. ? Stays in one area (localizes).  You have a rash.  You have a stiff neck.  You get angry or annoyed more easily than normal (irritability).  You are more sleepy than normal.  You have a harder time waking up than normal.  You feel: ? Weak. ? Dizzy. ? Very thirsty.  You have peed (urinated) only a small amount of very dark pee during 6-8 hours. Get help right away if:  You have symptoms of   very bad dehydration.  You cannot drink fluids without throwing up (vomiting).  Your symptoms get worse with treatment.  You have a fever.  You have a very bad headache.  You are throwing up or having watery poop (diarrhea) and it: ? Gets worse. ? Does not go away.  You have blood or something green (bile) in your throw-up.  You have blood in your poop (stool). This may cause poop to look black and tarry.  You have not peed in 6-8 hours.  You pass out (faint).  Your heart rate when you are sitting still is more than 100 beats a  minute.  You have trouble breathing. This information is not intended to replace advice given to you by your health care provider. Make sure you discuss any questions you have with your health care provider. Document Released: 08/30/2009 Document Revised: 05/23/2016 Document Reviewed: 12/28/2015 Elsevier Interactive Patient Education  2018 [employer]  

## 2018-08-20 NOTE — Progress Notes (Signed)
[facility] Progress Note   Nichole Cross [id number] [birth year] 77 y.o.  Nichole Cross is managed by Dr. Dominica Severin B. Sherrill   Actively treated with chemotherapy/immunotherapy: no  Assessment: Plan:    Dehydration - Plan: 0.9 %  sodium chloride infusion  Oral candidiasis - Plan: fluconazole (DIFLUCAN) 100 MG tablet  Local recurrence of rectal cancer  (HCC)   Dehydration: The patient was given 1 L of normal saline today.  Her labs returned stable.  She does not have an appointment for follow-up with Dr. Dominica Severin B. Sherrill.  Oral candidiasis: The patient was given a prescription for Diflucan 100 mg p.o. twice daily x10 days.  Local recurrence of rectal cancer: The patient is status post radiation therapy.  She completed radiation therapy on 08/02/2018.  Please see After Visit Summary for patient specific instructions.  Future Appointments  Date Time Provider [place]  09/01/2018  9:45 AM Karie Mainland, RD [facility] None  09/01/2018 10:30 AM Hayden Pedro, PA-C [facility] None    No orders of the defined types were placed in this encounter.      Subjective:   Patient ID:  Nichole Cross is a 77 y.o. (DOB 1940-11-18) female.  Chief Complaint:  Chief Complaint  Patient presents with  . Dehydration    HPI Nichole Cross is a 77 year old female with a history of a local recurrence of rectal cancer who is status post radiation therapy which she completed on 08/02/2018.  She presents to the clinic today with her son.  She reports having at least a 3-day history of fatigue, general achiness, myalgias, and anorexia.  She reports that she is not sleeping well at night but does acknowledge that she is taking naps during the day.  She denies nausea, vomiting, diarrhea, dizziness, weakness, chest pain, or shortness of breath.  She has had an 11 pound weight loss since 08/02/2018.  Medications: I have reviewed the patient's current  medications.  Allergies:  Allergies  Allergen Reactions  . Aspirin Itching  . Morphine And Related Rash    "veins popped up and they gave me benadryl    Past Medical History:  Diagnosis Date  . Anemia   . Arthritis    knees  . Blood dyscrasia 2016   microcytic anemia, likely iron deficiency anemia secondary to colon cancer  . Cataracts, bilateral   . Colon cancer ([place]) 06/2015   tubular villous adenoma on sigmoid colon specimen (08/09/15), myltiple tubular adenomas removed on a colonoscopy  11/16/15  . Complication of anesthesia   . Headache(784.0)    migraines  . Hypothyroidism   . Liver metastasis ([place])   . Metastasis to kidney ([place])   . Osteoporosis   . PONV (postoperative nausea and vomiting)    NAUSEA  . Renal carcinoma ([place]) 06/2016   ? primary  . Secondary liver cancer ([place]) 06/2016   ? metastasis from colon    Past Surgical History:  Procedure Laterality Date  . ABDOMINAL HYSTERECTOMY  1993   with bilateral BSO  . abdominal tumor  1993  . APPENDECTOMY    . IR GENERIC HISTORICAL  07/01/2016   IR RADIOLOGIST EVAL & MGMT 07/01/2016 Greggory Keen, MD [facility] INTERV RAD  . IR GENERIC HISTORICAL  08/14/2016   IR RADIOLOGIST EVAL & MGMT 08/14/2016 Jacqulynn Cadet, MD [facility] INTERV RAD  . IR RADIOLOGIST EVAL & MGMT  12/24/2017  . IR RADIOLOGIST EVAL & MGMT  04/22/2018  . LAPAROSCOPIC PARTIAL COLECTOMY N/A  08/09/2015   Procedure: LAPAROSCOPIC PARTIAL COLECTOMY;  Surgeon: Jackolyn Confer, MD;  Location: [facility];  Service: General;  Laterality: N/A;  . PARTIAL KNEE ARTHROPLASTY  09/09/2012   Procedure: UNICOMPARTMENTAL KNEE;  Surgeon: Mauri Pole, MD;  Location: [facility];  Service: Orthopedics;  Laterality: Left;  . [place] LIVER TUMOR  2018  . XI ROBOTIC ASSISTED LOWER ANTERIOR RESECTION N/A 01/09/2017   Procedure: XI ROBOTIC ASSISTED LOWER ANTERIOR RESECTION;  Surgeon: Leighton Ruff, MD;  Location: [facility];  Service: General;  Laterality: N/A;    Family  History  Problem Relation Age of Onset  . Pancreatic cancer Mother   . Parkinson's disease Mother   . Breast cancer Sister   . Parkinson's disease Brother   . Parkinson's disease Brother   . Colon cancer Neg Hx   . Esophageal cancer Neg Hx   . Liver cancer Neg Hx   . Rectal cancer Neg Hx   . Stomach cancer Neg Hx     Social History   Socioeconomic History  . Marital status: Divorced    Spouse name: Not on file  . Number of children: Not on file  . Years of education: Not on file  . Highest education level: Not on file  Occupational History  . Not on file  Social Needs  . Financial resource strain: Not on file  . Food insecurity:    Worry: Not on file    Inability: Not on file  . Transportation needs:    Medical: Not on file    Non-medical: Not on file  Tobacco Use  . Smoking status: Never Smoker  . Smokeless tobacco: Never Used  Substance and Sexual Activity  . Alcohol use: No  . Drug use: No  . Sexual activity: Not on file  Lifestyle  . Physical activity:    Days per week: Not on file    Minutes per session: Not on file  . Stress: Not on file  Relationships  . Social connections:    Talks on phone: Not on file    Gets together: Not on file    Attends religious service: Not on file    Active member of club or organization: Not on file    Attends meetings of clubs or organizations: Not on file    Relationship status: Not on file  . Intimate partner violence:    Fear of current or ex partner: Not on file    Emotionally abused: Not on file    Physically abused: Not on file    Forced sexual activity: Not on file  Other Topics Concern  . 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   Please see review of systems for further details on the patient's review from today.   Review of Systems:  Review of Systems  Constitutional: Positive for appetite change, fatigue and unexpected weight change. Negative for chills, diaphoresis and fever.  HENT: Negative for trouble swallowing.   Respiratory: Negative for cough, chest tightness and shortness of breath.   Cardiovascular: Negative for chest pain and palpitations.  Gastrointestinal: Negative for abdominal pain, constipation, diarrhea, nausea and vomiting.  Genitourinary: Negative for difficulty urinating and dysuria.  Musculoskeletal: Positive for myalgias.  Neurological: Negative for dizziness and headaches.    Objective:   Physical Exam:  BP 100/70   Pulse 78   Temp 98 F (36.7 C) (Oral)   Resp 18   Ht 5\' 7"  (1.702 m)   Wt 145 lb 8 oz (66 kg)   SpO2 99%   BMI 22.79 kg/m  ECOG: 1  Physical Exam  Constitutional: No distress.  HENT:  Head: Normocephalic and atraumatic.  Cardiovascular: Normal rate, regular rhythm and normal heart sounds. Exam reveals no gallop and no friction rub.  No murmur heard. Pulmonary/Chest: Effort normal and breath sounds normal. No respiratory distress. She has no wheezes. She has no rales.  Neurological: She is alert.  Skin: Skin is warm and dry. No rash noted. She is not diaphoretic. No erythema.    Lab Review:     Component Value Date/Time   NA 134 (L) 08/19/2018 1256   NA 138 05/27/2017 1408   K 3.6 08/19/2018 1256   K 3.9 05/27/2017 1408   CL 103 08/19/2018 1256   CO2 24 08/19/2018 1256   CO2 23 05/27/2017 1408   GLUCOSE 118 (H) 08/19/2018 1256   GLUCOSE 93 05/27/2017 1408   BUN 10 08/19/2018 1256   BUN 13.8 05/27/2017 1408   CREATININE 0.98 08/19/2018 1256   CREATININE 0.99 (H) 04/14/2018 1420   CREATININE 1.1 05/27/2017 1408   CALCIUM 8.1 (L) 08/19/2018 1256   CALCIUM 9.5 05/27/2017 1408   PROT 6.5  08/19/2018 1256   PROT 7.3 05/27/2017 1408   ALBUMIN 2.2 (L) 08/19/2018 1256   ALBUMIN 4.0 05/27/2017 1408   AST 20 08/19/2018 1256   AST 20 05/27/2017 1408   ALT 9 08/19/2018 1256   ALT 8 05/27/2017 1408   ALKPHOS 171 (H) 08/19/2018 1256   ALKPHOS 78 05/27/2017 1408   BILITOT 0.6 08/19/2018 1256   BILITOT 0.53 05/27/2017 1408   GFRNONAA 55 (L) 08/19/2018 1256   GFRNONAA 56 (L) 04/14/2018 1420   GFRAA >60 08/19/2018 1256   GFRAA 65 04/14/2018 1420       Component Value Date/Time   WBC 2.5 (L) 08/19/2018 1256   WBC 4.4 07/24/2018 0914   RBC 3.52 (L) 08/19/2018 1256   HGB 10.3 (L) 08/19/2018 1256   HGB 10.8 (L) 11/11/2017 1403   HCT 31.1 (L) 08/19/2018 1256   HCT 34.4 (L) 11/11/2017 1403   PLT 286 08/19/2018 1256   PLT 193 11/11/2017 1403   MCV 88.5 08/19/2018 1256   MCV 77.8 (L) 11/11/2017 1403   MCH 29.2 08/19/2018 1256   MCHC 33.1 08/19/2018 1256   RDW 27.7 (H) 08/19/2018 1256   RDW 19.6 (H) 11/11/2017 1403   LYMPHSABS 0.4 (L) 08/19/2018 1256   LYMPHSABS 1.0 11/11/2017 1403   MONOABS 0.2 08/19/2018 1256   MONOABS 0.3 11/11/2017 1403   EOSABS 0.0 08/19/2018 1256   EOSABS 0.1 11/11/2017 1403   BASOSABS 0.0 08/19/2018 1256   BASOSABS 0.0 11/11/2017 1403   -------------------------------  Imaging from last 24 hours (if applicable):  Radiology interpretation: Ct Abdomen Pelvis W Contrast  Result Date: 07/24/2018 CLINICAL DATA:  Worsening mid abdominal pain. History of metastatic colon cancer. Currently receiving chemotherapy and pelvic radiation. EXAM: CT ABDOMEN AND PELVIS WITH CONTRAST TECHNIQUE: Multidetector CT imaging of the abdomen and pelvis was performed using the standard protocol following bolus administration of intravenous contrast. CONTRAST:  184mL ISOVUE-300 IOPAMIDOL (ISOVUE-300) INJECTION 61% COMPARISON:  CT chest,  abdomen, and pelvis dated June 07, 2018. FINDINGS: Lower chest: No acute abnormality. Hepatobiliary: Diffuse hepatic steatosis. Stable  ablation sites in the left and right hepatic lobes. No new focal liver lesion. Gallbladder is mildly distended without wall thickening. No biliary dilatation. Pancreas: Unremarkable. No pancreatic ductal dilatation or surrounding inflammatory changes. Spleen: Normal in size. Unchanged small hypodense lesion near the hilum, likely a cyst or hemangioma. Adrenals/Urinary Tract: Stable left adrenal adenoma. The right adrenal gland is unremarkable. Unchanged 2.7 cm solid enhancing right upper pole renal mass. No renal or ureteral calculi. No hydronephrosis. The bladder is unremarkable. Stomach/Bowel: The stomach is within normal limits. There is new moderate wall thickening and mucosal enhancement of multiple small bowel loops in the lower abdomen and pelvis. There is mild small bowel distention proximal to the thickened small bowel loops. Postsurgical changes related to prior partial sigmoid colectomy. Mild-to-moderate colonic stool burden. The appendix is surgically absent. Vascular/Lymphatic: No significant vascular findings. Stable low attention periportal lymphadenopathy. Unchanged 12 mm portacaval lymph node. Unchanged metastatic implant in the left paracolic gutter adjacent to the descending colon, now measuring 18 x 16 mm, previously 18 x 15 mm. Reproductive: Status post hysterectomy. No adnexal masses. Other: Small amount of free fluid in the small bowel mesentery in the right lower quadrant. New presacral inflammatory stranding. No pneumoperitoneum or fluid collection. Musculoskeletal: No acute or significant osseous findings. IMPRESSION: 1. Inflammatory changes and mild dilatation of multiple small bowel loops in the lower abdomen and pelvis. Findings are suspicious for radiation enteritis given current pelvic radiation therapy. No definite obstruction. 2. Unchanged metastatic implant in the left paracolic gutter. Unchanged periportal lymphadenopathy. 3. Hepatic steatosis.  Stable liver ablation sites. 4.  Unchanged 2.7 cm enhancing right renal mass, consistent with renal cell carcinoma. Electronically Signed   By: Titus Dubin M.D.   On: 07/24/2018 10:42

## 2018-08-27 ENCOUNTER — Telehealth: Payer: Self-pay | Admitting: Nurse Practitioner

## 2018-08-27 NOTE — Telephone Encounter (Signed)
Scheduled appt per 10/11 sch message - pt is aware of appt date and time   

## 2018-09-01 ENCOUNTER — Other Ambulatory Visit: Payer: Self-pay

## 2018-09-01 ENCOUNTER — Ambulatory Visit
Admission: RE | Admit: 2018-09-01 | Discharge: 2018-09-01 | Disposition: A | Discharge status: Home / Self Care | Payer: Medicare Other | Source: Ambulatory Visit | Attending: Radiation Oncology | Admitting: Radiation Oncology

## 2018-09-01 ENCOUNTER — Inpatient Hospital Stay: Payer: Medicare Other | Admitting: Nutrition

## 2018-09-01 ENCOUNTER — Encounter: Payer: Self-pay | Admitting: Radiation Oncology

## 2018-09-01 VITALS — BP 110/80 | HR 78 | Temp 97.7°F | Resp 18 | Ht 68.0 in | Wt 146.2 lb

## 2018-09-01 DIAGNOSIS — Z79899 Other long term (current) drug therapy: Secondary | ICD-10-CM | POA: Insufficient documentation

## 2018-09-01 DIAGNOSIS — Z7982 Long term (current) use of aspirin: Secondary | ICD-10-CM | POA: Diagnosis not present

## 2018-09-01 DIAGNOSIS — Z923 Personal history of irradiation: Secondary | ICD-10-CM | POA: Diagnosis not present

## 2018-09-01 DIAGNOSIS — Z885 Allergy status to narcotic agent status: Secondary | ICD-10-CM | POA: Insufficient documentation

## 2018-09-01 DIAGNOSIS — Z886 Allergy status to analgesic agent status: Secondary | ICD-10-CM | POA: Diagnosis not present

## 2018-09-01 DIAGNOSIS — C187 Malignant neoplasm of sigmoid colon: Secondary | ICD-10-CM

## 2018-09-01 DIAGNOSIS — C20 Malignant neoplasm of rectum: Secondary | ICD-10-CM | POA: Insufficient documentation

## 2018-09-01 NOTE — Progress Notes (Signed)
Radiation Oncology         ([phone]) [phone] ________________________________  Name: Nichole Cross MRN: [id number]  Date of Service: 09/01/2018 DOB: Sep 18, 1941  Post Treatment Note  CC: [name], MD  Ladell Pier, MD  Diagnosis:   Recurrent metastatic Stage II Adenocarcinoma of the rectum.  Interval Since Last Radiation:  5 weeks   06/21/18-07/27/18:  45 Gy in 25 fractions to the rectum with a 9 Gy boost  Narrative:  The patient returns today for routine follow-up.  She tolerated radiotherapy pretty well but did have some trouble with esophagitis requiring IVF.                              On review of systems, the patient states she feels much better. She does have some oral mucositis. She reports her bowel are moving more regularly without diarrhea. No skin concerns are noted.  ALLERGIES:  is allergic to aspirin and morphine and related.  Meds: Current Outpatient Medications  Medication Sig Dispense Refill  . aspirin-acetaminophen-caffeine (EXCEDRIN MIGRAINE) 250-250-65 MG tablet Take 1 tablet by mouth every 6 (six) hours as needed for headache or migraine.    . capecitabine (XELODA) 500 MG tablet Take 3 tablets (1500mg ) by mouth 2 times daily within 30 min of finishing food. Take on days of radiation only, M-F 180 tablet 0  . ferrous sulfate 325 (65 FE) MG EC tablet Take 325 mg by mouth 3 (three) times daily with meals.    . fluconazole (DIFLUCAN) 100 MG tablet Take 1 tablet (100 mg total) by mouth daily. 4 tablet 0  . fluconazole (DIFLUCAN) 100 MG tablet Take 1 tablet (100 mg total) by mouth daily. 10 tablet 0  . HYDROcodone-acetaminophen (HYCET) 7.5-325 mg/15 ml solution Take 15 mLs by mouth every 6 (six) hours as needed for moderate pain. 240 mL 0  . levothyroxine (SYNTHROID, LEVOTHROID) 150 MCG tablet Take 1 tablet (150 mcg total) by mouth daily. 30 tablet 3  . loperamide (IMODIUM) 2 MG capsule Take 2 mg by mouth as needed for diarrhea or loose stools.    .  pantoprazole (PROTONIX) 40 MG tablet Take 1 tablet (40 mg total) by mouth daily. 30 tablet 0  . polyethylene glycol (MIRALAX / GLYCOLAX) packet Take 17 g by mouth daily as needed for moderate constipation.     . prochlorperazine (COMPAZINE) 10 MG tablet Take 1 tablet (10 mg total) by mouth every 6 (six) hours as needed for nausea or vomiting. 30 tablet 0  . sulfamethoxazole-trimethoprim (BACTRIM DS,SEPTRA DS) 800-160 MG tablet Take 1 tablet by mouth 2 (two) times daily. 10 tablet 0  . traMADol (ULTRAM) 50 MG tablet Take 1 tablet (50 mg total) by mouth every 6 (six) hours as needed. 60 tablet 0   Current Facility-Administered Medications  Medication Dose Route Frequency Provider Last Rate Last Dose  . 0.9 %  sodium chloride infusion  500 mL Intravenous Once Doran Stabler, MD        Physical Findings:  height is 5\' 8"  (1.727 m) and weight is 146 lb 4 oz (66.3 kg). Her oral temperature is 97.7 F (36.5 C). Her blood pressure is 110/80 and her pulse is 78. Her respiration is 18 and oxygen saturation is 100%.  Pain Assessment Pain Score: 0-No pain/10 In general this is a well appearing caucasian female in no acute distress. She's alert and oriented x4 and appropriate throughout the examination.  Cardiopulmonary assessment is negative for acute distress and she exhibits normal effort.   Lab Findings: Lab Results  Component Value Date   WBC 2.5 (L) 08/19/2018   HGB 10.3 (L) 08/19/2018   HCT 31.1 (L) 08/19/2018   MCV 88.5 08/19/2018   PLT 286 08/19/2018     Radiographic Findings: No results found.  Impression/Plan: 1. Recurrent metastatic Stage II Adenocarcinoma of the rectum. The patient is healing and recovering from her treatments. She will see medical oncology tomorrow. We did discuss that the lesion in the left pericolic gutter could be a site for SBRT. I will follow up with Dr. Benay Spice regarding this.      Carola Rhine, PAC  Addendum:  The patient will likely be  followed without additional radiotherapy per Dr. Benay Spice.

## 2018-09-01 NOTE — Progress Notes (Signed)
77 year old female diagnosed with metastatic colon cancer.  She is a patient of Dr. Benay Spice.  Past medical history includes osteoporosis, hypothyroidism, and anemia.  Medications include Synthroid, Imodium, Protonix, MiraLAX, and Compazine.  Labs were reviewed.  Height: 5 feet 7 inches. Weight: 146.8 pounds. Usual body weight: 160 pounds in September 2019. BMI: 22.32.  Patient has completed Xeloda and radiation therapy. Patient complains of decreased energy and increased fatigue. She states she has lactose intolerance. She is drinking Premier protein twice a day. Patient denies other nutrition impact symptoms. Dietary recall reveals patient is consuming 3 meals daily consisting of toast, eggs, sandwiches, macaroni and cheese, and different meats.  She has tried Ensure and she is okay with it. Patient declines nutrition focused physical exam  Nutrition diagnosis:  Unintended weight loss related to metastatic colon cancer as evidenced by 9% weight loss over 1 month.  Intervention: Patient was educated to continue 3 meals daily with high-protein foods at each meal. Educated patient on foods high in protein and calories and provided fact sheet. Recommended patient consume1 Premier protein daily and add 1 Ensure Enlive to provide additional calories. Provided coupons.  Questions were answered.  Teach back method used.  Monitoring, evaluation, goals: Patient will work to increase calories and protein to minimize further weight loss and improve energy/quality of life.  Next visit: To be scheduled as needed.  Patient has my contact information.  **Disclaimer: This note was dictated with voice recognition software. Similar sounding words can inadvertently be transcribed and this note may contain transcription errors which may not have been corrected upon publication of note.**

## 2018-09-02 ENCOUNTER — Encounter: Payer: Self-pay | Admitting: Nurse Practitioner

## 2018-09-02 ENCOUNTER — Inpatient Hospital Stay (HOSPITAL_BASED_OUTPATIENT_CLINIC_OR_DEPARTMENT_OTHER): Payer: Medicare Other | Admitting: Nurse Practitioner

## 2018-09-02 ENCOUNTER — Telehealth: Payer: Self-pay | Admitting: Oncology

## 2018-09-02 VITALS — BP 97/65 | HR 97 | Resp 18 | Ht 68.0 in | Wt 148.3 lb

## 2018-09-02 DIAGNOSIS — Z23 Encounter for immunization: Secondary | ICD-10-CM

## 2018-09-02 DIAGNOSIS — Z85038 Personal history of other malignant neoplasm of large intestine: Secondary | ICD-10-CM

## 2018-09-02 DIAGNOSIS — C189 Malignant neoplasm of colon, unspecified: Secondary | ICD-10-CM

## 2018-09-02 DIAGNOSIS — D509 Iron deficiency anemia, unspecified: Secondary | ICD-10-CM

## 2018-09-02 DIAGNOSIS — C787 Secondary malignant neoplasm of liver and intrahepatic bile duct: Principal | ICD-10-CM

## 2018-09-02 DIAGNOSIS — E860 Dehydration: Secondary | ICD-10-CM | POA: Diagnosis not present

## 2018-09-02 MED ORDER — TRAMADOL HCL 50 MG PO TABS: 50.0000 mg | ORAL_TABLET | Freq: Four times a day (QID) | ORAL | 0 refills | Status: DC | PRN

## 2018-09-02 MED ORDER — INFLUENZA VAC SPLIT QUAD 0.5 ML IM SUSY
0.5000 mL | PREFILLED_SYRINGE | Freq: Once | INTRAMUSCULAR | Status: AC
  Administered 2018-09-02: 0.5 mL via INTRAMUSCULAR

## 2018-09-02 MED ORDER — INFLUENZA VAC SPLIT QUAD 0.5 ML IM SUSY
PREFILLED_SYRINGE | INTRAMUSCULAR | Status: AC
  Filled 2018-09-02: qty 0.5

## 2018-09-02 MED ORDER — TEMAZEPAM 15 MG PO CAPS: 15.0000 mg | ORAL_CAPSULE | Freq: Every evening | ORAL | 0 refills | Status: DC | PRN

## 2018-09-02 NOTE — Progress Notes (Signed)
[facility] OFFICE PROGRESS NOTE   Diagnosis: Colon cancer  INTERVAL HISTORY:   Nichole Cross returns for follow-up.  She completed the course of Xeloda/radiation 08/02/2018.  She continues to feel weak.  She is no longer having rectal bleeding.  No abdominal pain.  Bowels alternating constipation and diarrhea.  No nausea.  Appetite is improving.  She is having difficulty remaining asleep.  She would like a sleep aid.  Objective:  Vital signs in last 24 hours:  Blood pressure 97/65, pulse 97, resp. rate 18, height '5\' 8"'  (1.727 m), weight 148 lb 4.8 oz (67.3 kg), SpO2 100 %.    HEENT: No thrush.  Small ulceration at the right lower lip.  Several tiny scabbed lesions just inferior to the right lower lip. Resp: Lungs clear bilaterally. Cardio: Regular rate and rhythm. GI: Abdomen soft and nontender.  No hepatomegaly. Vascular: No leg edema.  Lab Results:  Lab Results  Component Value Date   WBC 2.5 (L) 08/19/2018   HGB 10.3 (L) 08/19/2018   HCT 31.1 (L) 08/19/2018   MCV 88.5 08/19/2018   PLT 286 08/19/2018   NEUTROABS 1.9 08/19/2018    Imaging:  No results found.  Medications: I have reviewed the patient's current medications.  Assessment/Plan: 1. Adenocarcinoma the distal sigmoid colon, poorly differentiated, stage II (T3 N0), status post a laparoscopic assisted sigmoid colectomy 08/09/2015  No loss of mismatch repair protein expression  Elevated preoperative CEA  Staging CT of the abdomen and pelvis 06/22/2015 with no evidence of distant metastatic disease  Mildly elevated CEA 02/23/2017And 04/17/2016  CTs chest, abdomen, and pelvis on 06/04/2016, 14m left upper lobe nodule-no comparison available, new hypodense lesion in the left liver, solid Right renal mass  MRI 06/10/2016-2 enhancing lesions in the liver consistent with metastatic disease, segment 2 and segment 5. Solid enhancing right renal lesion consistent with a renal cell  carcinoma  Radiofrequency ablation of 2 liver lesions on 07/25/2016  Cycle 1 adjuvant Xeloda 08/18/2016  Cycle 2 Xeloda 09/08/2016  CEA improved 09/26/2016  Cycle 3 Xeloda held 09/29/2016 due to unexplained abdominal pain,referred for CT  CT abdomen/pelvis 09/30/2016 with a long segment of bowel wall thickening involving the distal ileum; lesion left hepatic lobe similar to slightly smaller following ablation; lesion posterior right hepatic lobe elongated favored to represent treatment tract.  Cycle 3 Xeloda 10/10/2016 (dose reduced due to drug induced enteritis following cycle 2)discontinued 10/15/2016 secondary to abdominal pain.  CT in while the emergency room with abdominal pain 10/15/2016 revealed descending colitis  Colonoscopy 10/30/2016 confirmed a mass at the: colo-colonicanastomosis  Cycle 4 Xeloda 11/03/2016  01/09/2017 status post a low anterior resection for removal of locally recurrent tumor at the sigmoid anastomosis, resection margins negative, tumor invades pericolonic soft tissue, 12 benign lymph nodes;MSI stable  Cycle 5 Xeloda 02/12/2017  Cycle 6 Xeloda 03/12/2017, Xeloda dose reduced to 1000 mg twice daily on 03/18/2017  Cycle 7 Xeloda 04/09/2017  Cycle 8 Xeloda 05/12/2017  Restaging CTs 06/24/2017-no evidence of metastatic disease involving the chest. Post ablation changes in the liver. No new hepatic metastatic disease.  Restaging CTs 12/14/2017- stable liver ablation sites, indeterminate nodule adjacent to the descending colon, stable left renal mass, changes of early cirrhosis, enlarged porta hepatis node-likely reactive  MRI abdomen 04/22/2018- enlargement of 1.7 cm left paracolic gutter mass, stable 1 cm extrahepatic left liver capsule lesion, no evidence of local tumor recurrence at the ablation sites, no new liver lesions, stable right kidney mass  Colonoscopy 05/19/2018-  mass in the rectum at the anastomotic site beginning at 3 cm from the anal  verge, biopsy confirmed invasive adenocarcinoma  CT 06/07/2018-stable liver ablation sites, enlarging peritoneal lesion at the left pericolic gutter, stable nodule in the sigmoid mesocolon, stable prominent periportal lymph nodes, no evidence of metastatic disease to the chest  Radiation/Xeloda beginning 06/21/2018  Xeloda dose reduced to 1000 mg twice daily days of radiationbeginning8/16/2019 due to neutropenia  Radiation/Xeloda completed 08/02/2018   2.History ofMicrocytic anemia-likely iron deficiency anemia secondary to colon cancer, persistent microcytic anemia;ferrous sulfate increased to twice daily 12/15/2017;hemoglobin in normal range 03/16/2018, persistent microcytosis  3.Tubular villous adenoma on the sigmoid colon resection specimen 08/09/2015  Multiple tubular adenomas removed on a colonoscopy 11/16/2015  4. Right renal mass on CT 06/04/2016-suspicious for renal cell carcinoma;stable on CT 06/24/2017;referred to urology  5. History ofneutropenia secondary to chemotherapy  6.Pain secondary to local recurrence of colon cancer  7.Abdominal pain. CT abdomen/pelvis 07/24/2018- inflammatory changes and mild dilatation of multiple small bowel loops in the lower abdomen and pelvis. Unchanged metastatic implant in the left paracolic gutter; unchanged periportal lymphadenopathy; hepatic steatosis; stable liver ablation site; unchanged 2.7 cm enhancing right renal mass.  Resolved.  Disposition: Nichole Cross appears stable.  She completed the course of palliative radiation/Xeloda a little over 1 month ago.  She is no longer having rectal bleeding.  The abdominal pain she began experiencing during the course of treatment has resolved.  We are requesting foundation 1 testing.  She will undergo restaging CTs around 10/07/2018.    For the difficulty sleeping she will try Restoril 15 mg at bedtime.  She understands she should not take Restoril and tramadol at the same  time.    She will return for a follow-up visit on 10/12/2018.  Patient seen with Dr. Benay Spice.  [name] ANP/GNP-BC   09/02/2018  1:50 PM  This was a shared visit with [name].  Nichole Cross completed the course of palliative radiation approximately 1 month ago.  She no longer has rectal bleeding or pain. We discussed treatment options.  She understands no therapy will be curative.  We will submit Foundation 1 testing.  She will return for an office visit and restaging CT in approximately 6 weeks.  We will discuss systemic treatment options versus continued observation at the next visit.  Julieanne Manson, MD

## 2018-09-02 NOTE — Telephone Encounter (Signed)
Gave pt avs and calendar  °

## 2018-09-07 ENCOUNTER — Encounter: Payer: Self-pay | Admitting: Emergency Medicine

## 2018-09-07 NOTE — Progress Notes (Signed)
Nichole Cross pathology confirmed they are sending out foundation one testing today.

## 2018-09-20 ENCOUNTER — Encounter (HOSPITAL_COMMUNITY): Payer: Self-pay | Admitting: Oncology

## 2018-09-23 NOTE — Progress Notes (Signed)
  Radiation Oncology         [phone] ________________________________  Name: Nichole Cross MRN: [id number]  Date: 08/02/2018  DOB: Dec 08, 1940  End of Treatment Note  Diagnosis:   77 y.o. female with Recurrent metastatic Stage II adenocarcinoma of the rectum with rectal pain and bleeding     Indication for treatment:  Curative       Radiation treatment dates:   06/21/2018 - 08/02/2018  Site/dose:    1. Rectum / 45 Gy in 25 fractions 2. Rectum Boost / 9 Gy in 5 fractions  Beams/energy:    1. 3D / 15X, 6X Photon 2. 3D / 15X Photon  Narrative: The patient tolerated radiation treatment very well.  She experienced increased fatigue and some dysuria over the course of treatment. Otherwise, no complaints of constipation, diarrhea, or rectal irritation.  Plan: The patient has completed radiation treatment. The patient will return to radiation oncology clinic for routine followup in one month. I advised them to call or return sooner if they have any questions or concerns related to their recovery or treatment.  ------------------------------------------------  Jodelle Gross, MD, PhD  This document serves as a record of services personally performed by Kyung Rudd, MD. It was created on his behalf by Rae Lips, a trained medical scribe. The creation of this record is based on the scribe's personal observations and the provider's statements to them. This document has been checked and approved by the attending provider.

## 2018-10-07 ENCOUNTER — Inpatient Hospital Stay: Payer: Medicare Other | Attending: Oncology

## 2018-10-07 ENCOUNTER — Ambulatory Visit (HOSPITAL_COMMUNITY)
Admission: RE | Admit: 2018-10-07 | Discharge: 2018-10-07 | Disposition: A | Discharge status: Home / Self Care | Payer: Medicare Other | Source: Ambulatory Visit | Attending: Nurse Practitioner | Admitting: Nurse Practitioner

## 2018-10-07 DIAGNOSIS — C787 Secondary malignant neoplasm of liver and intrahepatic bile duct: Secondary | ICD-10-CM | POA: Diagnosis present

## 2018-10-07 DIAGNOSIS — C189 Malignant neoplasm of colon, unspecified: Secondary | ICD-10-CM

## 2018-10-07 DIAGNOSIS — R911 Solitary pulmonary nodule: Secondary | ICD-10-CM | POA: Insufficient documentation

## 2018-10-07 DIAGNOSIS — C187 Malignant neoplasm of sigmoid colon: Secondary | ICD-10-CM | POA: Diagnosis not present

## 2018-10-07 DIAGNOSIS — N2889 Other specified disorders of kidney and ureter: Secondary | ICD-10-CM | POA: Diagnosis not present

## 2018-10-07 DIAGNOSIS — D509 Iron deficiency anemia, unspecified: Secondary | ICD-10-CM | POA: Insufficient documentation

## 2018-10-07 LAB — CBC WITH DIFFERENTIAL (CANCER CENTER ONLY)
Abs Immature Granulocytes: 0.03 10*3/uL (ref 0.00–0.07)
BASOS ABS: 0 10*3/uL (ref 0.0–0.1)
BASOS PCT: 1 %
EOS PCT: 2 %
Eosinophils Absolute: 0 10*3/uL (ref 0.0–0.5)
HCT: 37.3 % (ref 36.0–46.0)
HEMOGLOBIN: 12.1 g/dL (ref 12.0–15.0)
Immature Granulocytes: 1 %
LYMPHS PCT: 32 %
Lymphs Abs: 0.8 10*3/uL (ref 0.7–4.0)
MCH: 30.6 pg (ref 26.0–34.0)
MCHC: 32.4 g/dL (ref 30.0–36.0)
MCV: 94.4 fL (ref 80.0–100.0)
Monocytes Absolute: 0.2 10*3/uL (ref 0.1–1.0)
Monocytes Relative: 8 %
NRBC: 0 % (ref 0.0–0.2)
Neutro Abs: 1.5 10*3/uL — ABNORMAL LOW (ref 1.7–7.7)
Neutrophils Relative %: 56 %
PLATELETS: 277 10*3/uL (ref 150–400)
RBC: 3.95 MIL/uL (ref 3.87–5.11)
RDW: 12.8 % (ref 11.5–15.5)
WBC: 2.7 10*3/uL — AB (ref 4.0–10.5)

## 2018-10-07 LAB — CMP (CANCER CENTER ONLY)
ALT: 7 U/L (ref 0–44)
AST: 22 U/L (ref 15–41)
Albumin: 3.7 g/dL (ref 3.5–5.0)
Alkaline Phosphatase: 118 U/L (ref 38–126)
Anion gap: 10 (ref 5–15)
BUN: 9 mg/dL (ref 8–23)
CHLORIDE: 104 mmol/L (ref 98–111)
CO2: 27 mmol/L (ref 22–32)
CREATININE: 0.92 mg/dL (ref 0.44–1.00)
Calcium: 9.6 mg/dL (ref 8.9–10.3)
GFR, EST NON AFRICAN AMERICAN: 59 mL/min — AB (ref >60)
Glucose, Bld: 103 mg/dL — ABNORMAL HIGH (ref 70–99)
POTASSIUM: 3.7 mmol/L (ref 3.5–5.1)
Sodium: 141 mmol/L (ref 135–145)
TOTAL PROTEIN: 7.5 g/dL (ref 6.5–8.1)
Total Bilirubin: 0.5 mg/dL (ref 0.3–1.2)

## 2018-10-07 LAB — CEA (IN HOUSE-CHCC): CEA (CHCC-In House): 14.92 ng/mL — ABNORMAL HIGH (ref 0.00–5.00)

## 2018-10-07 MED ORDER — SODIUM CHLORIDE (PF) 0.9 % IJ SOLN
INTRAMUSCULAR | Status: AC
  Filled 2018-10-07: qty 50

## 2018-10-07 MED ORDER — IOHEXOL 300 MG/ML  SOLN
100.0000 mL | Freq: Once | INTRAMUSCULAR | Status: AC | PRN
  Administered 2018-10-07: 100 mL via INTRAVENOUS

## 2018-10-12 ENCOUNTER — Inpatient Hospital Stay (HOSPITAL_BASED_OUTPATIENT_CLINIC_OR_DEPARTMENT_OTHER): Payer: Medicare Other | Admitting: Oncology

## 2018-10-12 ENCOUNTER — Telehealth: Payer: Self-pay | Admitting: Oncology

## 2018-10-12 VITALS — BP 131/66 | HR 61 | Temp 97.4°F | Resp 18 | Ht 68.0 in | Wt 148.4 lb

## 2018-10-12 DIAGNOSIS — C187 Malignant neoplasm of sigmoid colon: Secondary | ICD-10-CM | POA: Diagnosis not present

## 2018-10-12 DIAGNOSIS — C787 Secondary malignant neoplasm of liver and intrahepatic bile duct: Secondary | ICD-10-CM | POA: Diagnosis not present

## 2018-10-12 DIAGNOSIS — D509 Iron deficiency anemia, unspecified: Secondary | ICD-10-CM

## 2018-10-12 DIAGNOSIS — C189 Malignant neoplasm of colon, unspecified: Secondary | ICD-10-CM

## 2018-10-12 NOTE — Telephone Encounter (Signed)
Gave pt avs and calendar  °

## 2018-10-12 NOTE — Progress Notes (Signed)
[place] OFFICE PROGRESS NOTE   Diagnosis: Colon cancer  INTERVAL HISTORY:   Nichole Cross returns as scheduled.  She continues to have mild anorexia malaise, but her overall performance status is improving.  She has intermittent right lower abdominal pain.  She has rectal urgency and intermittent incontinence.  Objective:  Vital signs in last 24 hours:  Blood pressure 131/66, pulse 61, temperature (!) 97.4 F (36.3 C), temperature source Oral, resp. rate 18, height '5\' 8"'  (1.727 m), weight 148 lb 6.4 oz (67.3 kg), SpO2 100 %.    HEENT: Neck without mass Lymphatics: No cervical, supraclavicular nodes, or inguinal nodes Resp: Lungs clear bilaterally Cardio: Regular rate and rhythm GI: No hepatomegaly, mild tenderness in the right lower abdomen, no mass Vascular: No leg edema   Lab Results:  Lab Results  Component Value Date   WBC 2.7 (L) 10/07/2018   HGB 12.1 10/07/2018   HCT 37.3 10/07/2018   MCV 94.4 10/07/2018   PLT 277 10/07/2018   NEUTROABS 1.5 (L) 10/07/2018    CMP  Lab Results  Component Value Date   NA 141 10/07/2018   K 3.7 10/07/2018   CL 104 10/07/2018   CO2 27 10/07/2018   GLUCOSE 103 (H) 10/07/2018   BUN 9 10/07/2018   CREATININE 0.92 10/07/2018   CALCIUM 9.6 10/07/2018   PROT 7.5 10/07/2018   ALBUMIN 3.7 10/07/2018   AST 22 10/07/2018   ALT 7 10/07/2018   ALKPHOS 118 10/07/2018   BILITOT 0.5 10/07/2018   GFRNONAA 59 (L) 10/07/2018   GFRAA >60 10/07/2018    Lab Results  Component Value Date   CEA1 14.92 (H) 10/07/2018     Imaging: CT images from 10/07/2018 reviewed with Nichole Cross and her daughter   Medications: I have reviewed the patient's current medications.   Assessment/Plan: 1. Adenocarcinoma the distal sigmoid colon, poorly differentiated, stage II (T3 N0), status post a laparoscopic assisted sigmoid colectomy 08/09/2015  No loss of mismatch repair protein expression  Elevated preoperative CEA  Staging CT of  the abdomen and pelvis 06/22/2015 with no evidence of distant metastatic disease  Mildly elevated CEA 02/23/2017And 04/17/2016  CTs chest, abdomen, and pelvis on 06/04/2016, 29m left upper lobe nodule-no comparison available, new hypodense lesion in the left liver, solid Right renal mass  MRI 06/10/2016-2 enhancing lesions in the liver consistent with metastatic disease, segment 2 and segment 5. Solid enhancing right renal lesion consistent with a renal cell carcinoma  Radiofrequency ablation of 2 liver lesions on 07/25/2016  Cycle 1 adjuvant Xeloda 08/18/2016  Cycle 2 Xeloda 09/08/2016  CEA improved 09/26/2016  Cycle 3 Xeloda held 09/29/2016 due to unexplained abdominal pain,referred for CT  CT abdomen/pelvis 09/30/2016 with a long segment of bowel wall thickening involving the distal ileum; lesion left hepatic lobe similar to slightly smaller following ablation; lesion posterior right hepatic lobe elongated favored to represent treatment tract.  Cycle 3 Xeloda 10/10/2016 (dose reduced due to drug induced enteritis following cycle 2)discontinued 10/15/2016 secondary to abdominal pain.  CT in while the emergency room with abdominal pain 10/15/2016 revealed descending colitis  Colonoscopy 10/30/2016 confirmed a mass at the: colo-colonicanastomosis  Cycle 4 Xeloda 11/03/2016  01/09/2017 status post a low anterior resection for removal of locally recurrent tumor at the sigmoid anastomosis, resection margins negative, tumor invades pericolonic soft tissue, 12 benign lymph nodes;MSI stable.  Foundation 1 testing- KRAS G13D, MSS, tumor mutation burden-3, BRAF wild-type  Cycle 5 Xeloda 02/12/2017  Cycle 6 Xeloda 03/12/2017, Xeloda dose  reduced to 1000 mg twice daily on 03/18/2017  Cycle 7 Xeloda 04/09/2017  Cycle 8 Xeloda 05/12/2017  Restaging CTs 06/24/2017-no evidence of metastatic disease involving the chest. Post ablation changes in the liver. No new hepatic metastatic  disease.  Restaging CTs 12/14/2017- stable liver ablation sites, indeterminate nodule adjacent to the descending colon, stable left renal mass, changes of early cirrhosis, enlarged porta hepatis node-likely reactive  MRI abdomen 04/22/2018- enlargement of 1.7 cm left paracolic gutter mass, stable 1 cm extrahepatic left liver capsule lesion, no evidence of local tumor recurrence at the ablation sites, no new liver lesions, stable right kidney mass  Colonoscopy 05/19/2018- mass in the rectum at the anastomotic site beginning at 3 cm from the anal verge, biopsy confirmed invasive adenocarcinoma  CT 06/07/2018-stable liver ablation sites, enlarging peritoneal lesion at the left pericolic gutter, stable nodule in the sigmoid mesocolon, stable prominent periportal lymph nodes, no evidence of metastatic disease to the chest  Radiation/Xeloda beginning 06/21/2018  Xeloda dose reduced to 1000 mg twice daily days of radiationbeginning8/16/2019 due to neutropenia  Radiation/Xeloda completed 08/02/2018  CTs 10/07/2018- new 6 mm right upper lobe nodule, stable ablation changes in the left and right liver, stable superior pole right renal mass, increased soft tissue thickening the presacral region, stable 12 mm porta hepatis node, increase in size of soft tissue nodule adjacent to the descending colon-2.1 x 2.2 cm   2.History ofMicrocytic anemia-likely iron deficiency anemia secondary to colon cancer, persistent microcytic anemia;ferrous sulfate increased to twice daily 12/15/2017;hemoglobin in normal range 03/16/2018, persistent microcytosis  3.Tubular villous adenoma on the sigmoid colon resection specimen 08/09/2015  Multiple tubular adenomas removed on a colonoscopy 11/16/2015  4. Right renal mass on CT 06/04/2016-suspicious for renal cell carcinoma;stable on CT 06/24/2017;referred to urology  5. History ofneutropenia secondary to chemotherapy  6.Pain secondary to local recurrence of  colon cancer  7.Abdominal pain. CT abdomen/pelvis 07/24/2018- inflammatory changes and mild dilatation of multiple small bowel loops in the lower abdomen and pelvis. Unchanged metastatic implant in the left paracolic gutter; unchanged periportal lymphadenopathy; hepatic steatosis; stable liver ablation site; unchanged 2.7 cm enhancing right renal mass.  Resolved.    Disposition: Nichole Cross has an improved performance status.  The restaging CTs demonstrate slight enlargement of the left paracolic mass, but no other clear evidence of disease progression.  The new right upper lung nodule is most likely inflammatory.  I discussed treatment options with Nichole Cross her daughter.  They understand no therapy will be curative.  We discussed the Foundation 1 testing result.  She is not a candidate for immunotherapy.  There are several systemic chemotherapy options.  She appears asymptomatic from the cancer at present.  We decided to follow her with observation.  She will return for an office visit and CEA in 2 months.  We will plan for restaging CTs in 4-6 months.  [name], MD  10/12/2018  4:05 PM

## 2018-12-14 ENCOUNTER — Other Ambulatory Visit: Payer: Self-pay | Admitting: Nurse Practitioner

## 2018-12-14 ENCOUNTER — Inpatient Hospital Stay: Payer: Medicare Other

## 2018-12-14 ENCOUNTER — Inpatient Hospital Stay: Payer: Medicare Other | Attending: Oncology | Admitting: Nurse Practitioner

## 2018-12-14 ENCOUNTER — Encounter: Payer: Self-pay | Admitting: Nurse Practitioner

## 2018-12-14 VITALS — BP 117/76 | HR 82 | Temp 97.5°F | Resp 18 | Ht 68.0 in | Wt 146.0 lb

## 2018-12-14 DIAGNOSIS — C187 Malignant neoplasm of sigmoid colon: Secondary | ICD-10-CM | POA: Insufficient documentation

## 2018-12-14 DIAGNOSIS — R197 Diarrhea, unspecified: Secondary | ICD-10-CM | POA: Diagnosis not present

## 2018-12-14 DIAGNOSIS — D509 Iron deficiency anemia, unspecified: Secondary | ICD-10-CM | POA: Diagnosis not present

## 2018-12-14 DIAGNOSIS — Z9221 Personal history of antineoplastic chemotherapy: Secondary | ICD-10-CM | POA: Diagnosis not present

## 2018-12-14 DIAGNOSIS — C787 Secondary malignant neoplasm of liver and intrahepatic bile duct: Principal | ICD-10-CM

## 2018-12-14 DIAGNOSIS — Z9049 Acquired absence of other specified parts of digestive tract: Secondary | ICD-10-CM | POA: Diagnosis not present

## 2018-12-14 DIAGNOSIS — C20 Malignant neoplasm of rectum: Secondary | ICD-10-CM

## 2018-12-14 DIAGNOSIS — N2889 Other specified disorders of kidney and ureter: Secondary | ICD-10-CM | POA: Diagnosis not present

## 2018-12-14 DIAGNOSIS — C189 Malignant neoplasm of colon, unspecified: Secondary | ICD-10-CM

## 2018-12-14 LAB — CEA (IN HOUSE-CHCC): CEA (CHCC-In House): 39.79 ng/mL — ABNORMAL HIGH (ref 0.00–5.00)

## 2018-12-14 MED ORDER — TRAMADOL HCL 50 MG PO TABS: 50.0000 mg | ORAL_TABLET | Freq: Four times a day (QID) | ORAL | 0 refills | Status: DC | PRN

## 2018-12-14 NOTE — Progress Notes (Addendum)
[place] OFFICE PROGRESS NOTE   Diagnosis: Colon cancer  INTERVAL HISTORY:   Ms. Keil returns as scheduled.  She reports significant diarrhea since completing radiation/Xeloda in September 2019.  She has diarrhea during the day and 5 or 6 large volume loose stools during the night on an intermittent basis.  She has tried Imodium with no improvement.  She denies abdominal pain.  No fever.  She reports a good appetite.  Her weight is stable.  For the past several months she has noted numbness at the left upper outer leg thigh region.  No back pain.  No leg weakness.  The area of numbness feels "cold" to her touch.  Objective:  Vital signs in last 24 hours:  Blood pressure 117/76, pulse 82, temperature (!) 97.5 F (36.4 C), temperature source Oral, resp. rate 18, height 5' 8" (1.727 m), weight 146 lb (66.2 kg), SpO2 99 %.    HEENT: No thrush or ulcers. Lymphatics: No palpable cervical, supraclavicular, axillary or inguinal lymph nodes. Resp: Lungs clear bilaterally. Cardio: Regular rate and rhythm. GI: Abdomen soft and nontender.  No hepatomegaly.  No mass. Vascular: No leg edema.  Intact left femoral pulse.  Legs are warm to touch. Neuro: Lower extremity motor strength 5/5.   Lab Results:  Lab Results  Component Value Date   WBC 2.7 (L) 10/07/2018   HGB 12.1 10/07/2018   HCT 37.3 10/07/2018   MCV 94.4 10/07/2018   PLT 277 10/07/2018   NEUTROABS 1.5 (L) 10/07/2018    Imaging:  No results found.  Medications: I have reviewed the patient's current medications.  Assessment/Plan: 1. Adenocarcinoma the distal sigmoid colon, poorly differentiated, stage II (T3 N0), status post a laparoscopic assisted sigmoid colectomy 08/09/2015  No loss of mismatch repair protein expression  Elevated preoperative CEA  Staging CT of the abdomen and pelvis 06/22/2015 with no evidence of distant metastatic disease  Mildly elevated CEA 02/23/2017And 04/17/2016  CTs  chest, abdomen, and pelvis on 06/04/2016, 75m left upper lobe nodule-no comparison available, new hypodense lesion in the left liver, solid Right renal mass  MRI 06/10/2016-2 enhancing lesions in the liver consistent with metastatic disease, segment 2 and segment 5. Solid enhancing right renal lesion consistent with a renal cell carcinoma  Radiofrequency ablation of 2 liver lesions on 07/25/2016  Cycle 1 adjuvant Xeloda 08/18/2016  Cycle 2 Xeloda 09/08/2016  CEA improved 09/26/2016  Cycle 3 Xeloda held 09/29/2016 due to unexplained abdominal pain,referred for CT  CT abdomen/pelvis 09/30/2016 with a long segment of bowel wall thickening involving the distal ileum; lesion left hepatic lobe similar to slightly smaller following ablation; lesion posterior right hepatic lobe elongated favored to represent treatment tract.  Cycle 3 Xeloda 10/10/2016 (dose reduced due to drug induced enteritis following cycle 2)discontinued 10/15/2016 secondary to abdominal pain.  CT in while the emergency room with abdominal pain 10/15/2016 revealed descending colitis  Colonoscopy 10/30/2016 confirmed a mass at the: colo-colonicanastomosis  Cycle 4 Xeloda 11/03/2016  01/09/2017 status post a low anterior resection for removal of locally recurrent tumor at the sigmoid anastomosis, resection margins negative, tumor invades pericolonic soft tissue, 12 benign lymph nodes;MSI stable.  Foundation 1 testing- KRAS G13D, MSS, tumor mutation burden-3, BRAF wild-type  Cycle 5 Xeloda 02/12/2017  Cycle 6 Xeloda 03/12/2017, Xeloda dose reduced to 1000 mg twice daily on 03/18/2017  Cycle 7 Xeloda 04/09/2017  Cycle 8 Xeloda 05/12/2017  Restaging CTs 06/24/2017-no evidence of metastatic disease involving the chest. Post ablation changes in the liver.  No new hepatic metastatic disease.  Restaging CTs 12/14/2017- stable liver ablation sites, indeterminate nodule adjacent to the descending colon, stable left renal  mass, changes of early cirrhosis, enlarged porta hepatis node-likely reactive  MRI abdomen 04/22/2018- enlargement of 1.7 cm left paracolic gutter mass, stable 1 cm extrahepatic left liver capsule lesion, no evidence of local tumor recurrence at the ablation sites, no new liver lesions, stable right kidney mass  Colonoscopy 05/19/2018- mass in the rectum at the anastomotic site beginning at 3 cm from the anal verge, biopsy confirmed invasive adenocarcinoma  CT 06/07/2018-stable liver ablation sites, enlarging peritoneal lesion at the left pericolic gutter, stable nodule in the sigmoid mesocolon, stable prominent periportal lymph nodes, no evidence of metastatic disease to the chest  Radiation/Xeloda beginning 06/21/2018  Xeloda dose reduced to 1000 mg twice daily days of radiationbeginning8/16/2019 due to neutropenia  Radiation/Xeloda completed 08/02/2018  CTs 10/07/2018- new 6 mm right upper lobe nodule, stable ablation changes in the left and right liver, stable superior pole right renal mass, increased soft tissue thickening the presacral region, stable 12 mm porta hepatis node, increase in size of soft tissue nodule adjacent to the descending colon-2.1 x 2.2 cm   2.History ofMicrocytic anemia-likely iron deficiency anemia secondary to colon cancer, persistent microcytic anemia;ferrous sulfate increased to twice daily 12/15/2017;hemoglobin in normal range 03/16/2018, persistent microcytosis  3.Tubular villous adenoma on the sigmoid colon resection specimen 08/09/2015  Multiple tubular adenomas removed on a colonoscopy 11/16/2015  4. Right renal mass on CT 06/04/2016-suspicious for renal cell carcinoma;stable on CT 06/24/2017;referred to urology  5. History ofneutropenia secondary to chemotherapy  6.Pain secondary to local recurrence of colon cancer  7.Abdominal pain. CT abdomen/pelvis 07/24/2018- inflammatory changes and mild dilatation of multiple small bowel loops  in the lower abdomen and pelvis. Unchanged metastatic implant in the left paracolic gutter; unchanged periportal lymphadenopathy; hepatic steatosis; stable liver ablation site; unchanged 2.7 cm enhancing right renal mass.Resolved.    Disposition: Ms. Daughety appears unchanged.  There is no clinical evidence of disease progression.  We will follow-up on the CEA from today.  The plan is for restaging CTs in approximately 2 months which will be at a 13-monthinterval from the previous.  We made a referral to Dr. PHilarie Fredricksonto evaluate the diarrhea.  Etiology of the numbness at the left upper outer thigh is unclear.  Examination is unremarkable.  Question related to previous surgery or radiation.  She understands to contact the office should she develop pain, weakness, other concerning symptoms.  She will return for a follow-up visit in 2 months, 2 to 3 days after the CT scans.  She will contact the office in the interim as outlined above or with any other problems.  Patient seen with Dr. SBenay Spice    LNed CardANP/GNP-BC   12/14/2018  3:51 PM  This was a shared visit with LNed Card  Ms. BCloshas frequent bowel movements of unclear etiology.  We referred her to Dr. PHilarie Fredrickson  She will undergo a restaging CT evaluation in 2 months.  I doubt the diarrhea or the left thigh numbness are related to progression of the colorectal cancer.  BJulieanne Manson MD

## 2018-12-14 NOTE — Progress Notes (Deleted)
[place] OFFICE PROGRESS NOTE   Diagnosis:    INTERVAL HISTORY:   ***  Objective:  Vital signs in last 24 hours:  There were no vitals taken for this visit.    HEENT: *** Lymphatics: *** Resp: *** Cardio: *** GI: *** Vascular: *** Neuro: ***  Skin: ***    Lab Results:  Lab Results  Component Value Date   WBC 2.7 (L) 10/07/2018   HGB 12.1 10/07/2018   HCT 37.3 10/07/2018   MCV 94.4 10/07/2018   PLT 277 10/07/2018   NEUTROABS 1.5 (L) 10/07/2018    Imaging:  No results found.  Medications: I have reviewed the patient's current medications.  Assessment/Plan: 1. Adenocarcinoma the distal sigmoid colon, poorly differentiated, stage II (T3 N0), status post a laparoscopic assisted sigmoid colectomy 08/09/2015  No loss of mismatch repair protein expression  Elevated preoperative CEA  Staging CT of the abdomen and pelvis 06/22/2015 with no evidence of distant metastatic disease  Mildly elevated CEA 02/23/2017And 04/17/2016  CTs chest, abdomen, and pelvis on 06/04/2016, 32m left upper lobe nodule-no comparison available, new hypodense lesion in the left liver, solid Right renal mass  MRI 06/10/2016-2 enhancing lesions in the liver consistent with metastatic disease, segment 2 and segment 5. Solid enhancing right renal lesion consistent with a renal cell carcinoma  Radiofrequency ablation of 2 liver lesions on 07/25/2016  Cycle 1 adjuvant Xeloda 08/18/2016  Cycle 2 Xeloda 09/08/2016  CEA improved 09/26/2016  Cycle 3 Xeloda held 09/29/2016 due to unexplained abdominal pain,referred for CT  CT abdomen/pelvis 09/30/2016 with a long segment of bowel wall thickening involving the distal ileum; lesion left hepatic lobe similar to slightly smaller following ablation; lesion posterior right hepatic lobe elongated favored to represent treatment tract.  Cycle 3 Xeloda 10/10/2016 (dose reduced due to drug induced enteritis following cycle  2)discontinued 10/15/2016 secondary to abdominal pain.  CT in while the emergency room with abdominal pain 10/15/2016 revealed descending colitis  Colonoscopy 10/30/2016 confirmed a mass at the: colo-colonicanastomosis  Cycle 4 Xeloda 11/03/2016  01/09/2017 status post a low anterior resection for removal of locally recurrent tumor at the sigmoid anastomosis, resection margins negative, tumor invades pericolonic soft tissue, 12 benign lymph nodes;MSI stable.  Foundation 1 testing- KRAS G13D, MSS, tumor mutation burden-3, BRAF wild-type  Cycle 5 Xeloda 02/12/2017  Cycle 6 Xeloda 03/12/2017, Xeloda dose reduced to 1000 mg twice daily on 03/18/2017  Cycle 7 Xeloda 04/09/2017  Cycle 8 Xeloda 05/12/2017  Restaging CTs 06/24/2017-no evidence of metastatic disease involving the chest. Post ablation changes in the liver. No new hepatic metastatic disease.  Restaging CTs 12/14/2017- stable liver ablation sites, indeterminate nodule adjacent to the descending colon, stable left renal mass, changes of early cirrhosis, enlarged porta hepatis node-likely reactive  MRI abdomen 04/22/2018- enlargement of 1.7 cm left paracolic gutter mass, stable 1 cm extrahepatic left liver capsule lesion, no evidence of local tumor recurrence at the ablation sites, no new liver lesions, stable right kidney mass  Colonoscopy 05/19/2018- mass in the rectum at the anastomotic site beginning at 3 cm from the anal verge, biopsy confirmed invasive adenocarcinoma  CT 06/07/2018-stable liver ablation sites, enlarging peritoneal lesion at the left pericolic gutter, stable nodule in the sigmoid mesocolon, stable prominent periportal lymph nodes, no evidence of metastatic disease to the chest  Radiation/Xeloda beginning 06/21/2018  Xeloda dose reduced to 1000 mg twice daily days of radiationbeginning8/16/2019 due to neutropenia  Radiation/Xeloda completed 08/02/2018  CTs 10/07/2018- new 6 mm right upper lobe nodule,  stable  ablation changes in the left and right liver, stable superior pole right renal mass, increased soft tissue thickening the presacral region, stable 12 mm porta hepatis node, increase in size of soft tissue nodule adjacent to the descending colon-2.1 x 2.2 cm   2.History ofMicrocytic anemia-likely iron deficiency anemia secondary to colon cancer, persistent microcytic anemia;ferrous sulfate increased to twice daily 12/15/2017;hemoglobin in normal range 03/16/2018, persistent microcytosis  3.Tubular villous adenoma on the sigmoid colon resection specimen 08/09/2015  Multiple tubular adenomas removed on a colonoscopy 11/16/2015  4. Right renal mass on CT 06/04/2016-suspicious for renal cell carcinoma;stable on CT 06/24/2017;referred to urology  5. History ofneutropenia secondary to chemotherapy  6.Pain secondary to local recurrence of colon cancer  7.Abdominal pain. CT abdomen/pelvis 07/24/2018- inflammatory changes and mild dilatation of multiple small bowel loops in the lower abdomen and pelvis. Unchanged metastatic implant in the left paracolic gutter; unchanged periportal lymphadenopathy; hepatic steatosis; stable liver ablation site; unchanged 2.7 cm enhancing right renal mass.Resolved.    Disposition:    [name] ANP/GNP-BC   12/14/2018  2:50 PM

## 2018-12-15 ENCOUNTER — Other Ambulatory Visit: Payer: Self-pay | Admitting: Nurse Practitioner

## 2018-12-15 ENCOUNTER — Telehealth: Payer: Self-pay | Admitting: Nurse Practitioner

## 2018-12-15 DIAGNOSIS — C787 Secondary malignant neoplasm of liver and intrahepatic bile duct: Principal | ICD-10-CM

## 2018-12-15 DIAGNOSIS — C189 Malignant neoplasm of colon, unspecified: Secondary | ICD-10-CM

## 2018-12-15 NOTE — Telephone Encounter (Signed)
Scheduled appt per 1/28 los - sent reminder letter in the mail with appt date and time

## 2018-12-17 ENCOUNTER — Telehealth: Payer: Self-pay | Admitting: *Deleted

## 2018-12-17 NOTE — Telephone Encounter (Signed)
Notified of the CEA elevation and need for CT scan in 1 month. She understands and agrees and appreciates the call.

## 2018-12-20 ENCOUNTER — Telehealth: Payer: Self-pay | Admitting: Oncology

## 2018-12-20 NOTE — Telephone Encounter (Signed)
Scheduled appt per 1/31 sch message - pt is aware of appt date and time

## 2019-01-04 ENCOUNTER — Encounter: Payer: Self-pay | Admitting: Oncology

## 2019-01-04 NOTE — Progress Notes (Signed)
Patient came in regarding billing concerns/assistance. Advised patient of Access One option and gave her the brochure. Also advised patient of the hardship settlement and what balance requirement would need to be apply. Advised her to contact the number on the bill to discuss further any arrangements or options. She verbalized understanding.  Also advised patient there is no copay assistance for her diagnosis. She verbalized understanding.

## 2019-01-05 ENCOUNTER — Other Ambulatory Visit: Payer: Self-pay | Admitting: Interventional Radiology

## 2019-01-05 ENCOUNTER — Encounter: Payer: Self-pay | Admitting: *Deleted

## 2019-01-05 DIAGNOSIS — C787 Secondary malignant neoplasm of liver and intrahepatic bile duct: Principal | ICD-10-CM

## 2019-01-05 DIAGNOSIS — C189 Malignant neoplasm of colon, unspecified: Secondary | ICD-10-CM

## 2019-01-10 ENCOUNTER — Other Ambulatory Visit: Payer: Self-pay | Admitting: *Deleted

## 2019-01-10 DIAGNOSIS — C189 Malignant neoplasm of colon, unspecified: Secondary | ICD-10-CM

## 2019-01-10 DIAGNOSIS — C787 Secondary malignant neoplasm of liver and intrahepatic bile duct: Principal | ICD-10-CM

## 2019-01-11 ENCOUNTER — Inpatient Hospital Stay: Payer: Medicare Other | Attending: Oncology

## 2019-01-11 ENCOUNTER — Encounter (HOSPITAL_COMMUNITY): Payer: Self-pay

## 2019-01-11 ENCOUNTER — Ambulatory Visit (HOSPITAL_COMMUNITY)
Admission: RE | Admit: 2019-01-11 | Discharge: 2019-01-11 | Disposition: A | Discharge status: Home / Self Care | Payer: Medicare Other | Source: Ambulatory Visit | Attending: Nurse Practitioner | Admitting: Nurse Practitioner

## 2019-01-11 DIAGNOSIS — C787 Secondary malignant neoplasm of liver and intrahepatic bile duct: Secondary | ICD-10-CM | POA: Diagnosis not present

## 2019-01-11 DIAGNOSIS — D509 Iron deficiency anemia, unspecified: Secondary | ICD-10-CM | POA: Diagnosis not present

## 2019-01-11 DIAGNOSIS — C189 Malignant neoplasm of colon, unspecified: Secondary | ICD-10-CM

## 2019-01-11 DIAGNOSIS — C187 Malignant neoplasm of sigmoid colon: Secondary | ICD-10-CM | POA: Insufficient documentation

## 2019-01-11 LAB — CMP (CANCER CENTER ONLY)
ALBUMIN: 3.6 g/dL (ref 3.5–5.0)
ALT: 7 U/L (ref 0–44)
AST: 24 U/L (ref 15–41)
Alkaline Phosphatase: 86 U/L (ref 38–126)
Anion gap: 8 (ref 5–15)
BUN: 16 mg/dL (ref 8–23)
CO2: 26 mmol/L (ref 22–32)
Calcium: 9.7 mg/dL (ref 8.9–10.3)
Chloride: 100 mmol/L (ref 98–111)
Creatinine: 1.24 mg/dL — ABNORMAL HIGH (ref 0.44–1.00)
GFR, Est AFR Am: 49 mL/min — ABNORMAL LOW (ref >60)
GFR, Estimated: 42 mL/min — ABNORMAL LOW (ref >60)
GLUCOSE: 99 mg/dL (ref 70–99)
Potassium: 4.7 mmol/L (ref 3.5–5.1)
Sodium: 134 mmol/L — ABNORMAL LOW (ref 135–145)
Total Bilirubin: 0.4 mg/dL (ref 0.3–1.2)
Total Protein: 7.5 g/dL (ref 6.5–8.1)

## 2019-01-11 MED ORDER — SODIUM CHLORIDE (PF) 0.9 % IJ SOLN
INTRAMUSCULAR | Status: AC
  Filled 2019-01-11: qty 50

## 2019-01-11 MED ORDER — IOHEXOL 300 MG/ML  SOLN
100.0000 mL | Freq: Once | INTRAMUSCULAR | Status: AC | PRN
  Administered 2019-01-11: 100 mL via INTRAVENOUS

## 2019-01-13 ENCOUNTER — Other Ambulatory Visit: Payer: Self-pay

## 2019-01-13 ENCOUNTER — Inpatient Hospital Stay: Payer: Medicare Other | Admitting: Oncology

## 2019-01-13 ENCOUNTER — Telehealth: Payer: Self-pay | Admitting: Oncology

## 2019-01-13 VITALS — BP 120/74 | HR 69 | Temp 97.4°F | Resp 17 | Ht 68.0 in | Wt 146.2 lb

## 2019-01-13 DIAGNOSIS — C787 Secondary malignant neoplasm of liver and intrahepatic bile duct: Principal | ICD-10-CM

## 2019-01-13 DIAGNOSIS — C189 Malignant neoplasm of colon, unspecified: Secondary | ICD-10-CM

## 2019-01-13 DIAGNOSIS — D509 Iron deficiency anemia, unspecified: Secondary | ICD-10-CM | POA: Diagnosis not present

## 2019-01-13 DIAGNOSIS — C187 Malignant neoplasm of sigmoid colon: Secondary | ICD-10-CM | POA: Diagnosis not present

## 2019-01-13 MED ORDER — TEMAZEPAM 15 MG PO CAPS: 15.0000 mg | ORAL_CAPSULE | Freq: Every evening | ORAL | 1 refills | Status: DC | PRN

## 2019-01-13 NOTE — Progress Notes (Signed)
[place] OFFICE PROGRESS NOTE   Diagnosis: Colon cancer  INTERVAL HISTORY:   [name] turns for a scheduled visit.  She feels well.  Good appetite.  She has constipation and diarrhea.  She has diarrhea once or twice a week.  No bleeding.  She is scheduled to see Dr. Hilarie Fredrickson for evaluation of the irregular bowel habits.  Objective:  Vital signs in last 24 hours:  Blood pressure 120/74, pulse 69, temperature (!) 97.4 F (36.3 C), temperature source Oral, resp. rate 17, height 5' 8" (1.727 m), weight 146 lb 3.2 oz (66.3 kg), SpO2 100 %.    HEENT: Neck without mass Lymphatics: No cervical, supraclavicular, axillary, or inguinal nodes Resp: Lungs clear bilaterally Cardio: Regular rate and rhythm GI: No mass, no hepatosplenomegaly, nontender Vascular: No leg edema _0  Lab Results:  Lab Results  Component Value Date   WBC 2.7 (L) 10/07/2018   HGB 12.1 10/07/2018   HCT 37.3 10/07/2018   MCV 94.4 10/07/2018   PLT 277 10/07/2018   NEUTROABS 1.5 (L) 10/07/2018    CMP  Lab Results  Component Value Date   NA 134 (L) 01/11/2019   K 4.7 01/11/2019   CL 100 01/11/2019   CO2 26 01/11/2019   GLUCOSE 99 01/11/2019   BUN 16 01/11/2019   CREATININE 1.24 (H) 01/11/2019   CALCIUM 9.7 01/11/2019   PROT 7.5 01/11/2019   ALBUMIN 3.6 01/11/2019   AST 24 01/11/2019   ALT 7 01/11/2019   ALKPHOS 86 01/11/2019   BILITOT 0.4 01/11/2019   GFRNONAA 42 (L) 01/11/2019   GFRAA 49 (L) 01/11/2019    Lab Results  Component Value Date   CEA1 39.79 (H) 12/14/2018     Imaging:  Ct Chest W Contrast  Result Date: 01/11/2019 CLINICAL DATA:  Follow-up metastatic colon cancer EXAM: CT CHEST, ABDOMEN, AND PELVIS WITH CONTRAST TECHNIQUE: Multidetector CT imaging of the chest, abdomen and pelvis was performed following the standard protocol during bolus administration of intravenous contrast. CONTRAST:  52m OMNIPAQUE IOHEXOL 300 MG/ML  SOLN COMPARISON:  10/07/2018 FINDINGS: CT  CHEST FINDINGS Cardiovascular: Heart is normal in size.  No pericardial effusion. No evidence thoracic aortic aneurysm. Mild atherosclerotic calcifications of the aortic arch. Mediastinum/Nodes: No suspicious mediastinal lymphadenopathy. Lungs/Pleura: Mild biapical pleural-parenchymal scarring. 3 mm subpleural nodule in the anterior/inferior right upper lobe (series 6/image 94), previously 6 mm, favoring post infectious/inflammatory scarring. No new/suspicious pulmonary nodules. No focal consolidation. No pleural effusion or pneumothorax. Musculoskeletal: Visualized osseous structures are within normal limits. CT ABDOMEN PELVIS FINDINGS Hepatobiliary: Post ablation changes in segment 2 (series 2/image 59) and segment 6 (series 2/image 64) of the liver, unchanged. Nodular hepatic contour. Gallbladder is unremarkable. No intrahepatic or extrahepatic ductal dilatation. Pancreas: Within normal limits. Spleen: 8 mm probable splenic cyst (series 2/image 61), unchanged. Adrenals/Urinary Tract: Stable 1.9 cm nodule in the medial left adrenal gland (series 2/image 59). Right adrenal gland is within normal limits. Stable 2.6 cm enhancing lesion in the right upper kidney (series 2/image 62), suspicious for solid renal neoplasm such as renal cell carcinoma. Left kidney is within normal limits.  No hydronephrosis. Thick-walled bladder, although underdistended. Stomach/Bowel: Stomach is within normal limits. No evidence of bowel obstruction. Prior appendectomy. Status post left hemicolectomy with suture line in the lower pelvis (series 2/image 117). Adjacent wall thickening may reflect residual/recurrent tumor (series 2/image 115), including posterior extension to the presacral region (series 2/image 116) and anterior extension involving the left vaginal fornix (series 2/image 117). Vascular/Lymphatic:  No evidence of abdominal aortic aneurysm. No suspicious abdominopelvic lymphadenopathy. 11 mm short axis portacaval node (series  2/image 62), within normal limits. Reproductive: Status post hysterectomy. Possible abnormal soft tissue involving the left vaginal fornix, as described above. No adnexal masses. Other: No abdominopelvic ascites. 2.7 x 2.7 cm peritoneal implant along the left pericolic gutter (series 2/image 78), previously 2.1 x 2.2 cm. Musculoskeletal: Visualized osseous structures are within normal limits. IMPRESSION: Status post left hemicolectomy. Suspected residual/recurrent tumor along the suture line with possible posterior tension into the presacral region and anterior extension involving the left vaginal fornix. 2.7 cm peritoneal implant along the left pericolic gutter, mildly progressive. No evidence of metastatic disease in the chest. 3 mm subpleural nodule in the anterior/inferior right upper lobe is improved, likely reflecting post infectious/inflammatory scarring. Stable 2.6 cm enhancing lesion in the right upper kidney, suspicious for solid renal neoplasm such as renal cell carcinoma. Additional stable ancillary findings as above. Electronically Signed   By: Julian Hy M.D.   On: 01/11/2019 15:00   Ct Abdomen Pelvis W Contrast  Result Date: 01/11/2019 CLINICAL DATA:  Follow-up metastatic colon cancer EXAM: CT CHEST, ABDOMEN, AND PELVIS WITH CONTRAST TECHNIQUE: Multidetector CT imaging of the chest, abdomen and pelvis was performed following the standard protocol during bolus administration of intravenous contrast. CONTRAST:  29m OMNIPAQUE IOHEXOL 300 MG/ML  SOLN COMPARISON:  10/07/2018 FINDINGS: CT CHEST FINDINGS Cardiovascular: Heart is normal in size.  No pericardial effusion. No evidence thoracic aortic aneurysm. Mild atherosclerotic calcifications of the aortic arch. Mediastinum/Nodes: No suspicious mediastinal lymphadenopathy. Lungs/Pleura: Mild biapical pleural-parenchymal scarring. 3 mm subpleural nodule in the anterior/inferior right upper lobe (series 6/image 94), previously 6 mm, favoring post  infectious/inflammatory scarring. No new/suspicious pulmonary nodules. No focal consolidation. No pleural effusion or pneumothorax. Musculoskeletal: Visualized osseous structures are within normal limits. CT ABDOMEN PELVIS FINDINGS Hepatobiliary: Post ablation changes in segment 2 (series 2/image 59) and segment 6 (series 2/image 64) of the liver, unchanged. Nodular hepatic contour. Gallbladder is unremarkable. No intrahepatic or extrahepatic ductal dilatation. Pancreas: Within normal limits. Spleen: 8 mm probable splenic cyst (series 2/image 61), unchanged. Adrenals/Urinary Tract: Stable 1.9 cm nodule in the medial left adrenal gland (series 2/image 59). Right adrenal gland is within normal limits. Stable 2.6 cm enhancing lesion in the right upper kidney (series 2/image 62), suspicious for solid renal neoplasm such as renal cell carcinoma. Left kidney is within normal limits.  No hydronephrosis. Thick-walled bladder, although underdistended. Stomach/Bowel: Stomach is within normal limits. No evidence of bowel obstruction. Prior appendectomy. Status post left hemicolectomy with suture line in the lower pelvis (series 2/image 117). Adjacent wall thickening may reflect residual/recurrent tumor (series 2/image 115), including posterior extension to the presacral region (series 2/image 116) and anterior extension involving the left vaginal fornix (series 2/image 117). Vascular/Lymphatic: No evidence of abdominal aortic aneurysm. No suspicious abdominopelvic lymphadenopathy. 11 mm short axis portacaval node (series 2/image 62), within normal limits. Reproductive: Status post hysterectomy. Possible abnormal soft tissue involving the left vaginal fornix, as described above. No adnexal masses. Other: No abdominopelvic ascites. 2.7 x 2.7 cm peritoneal implant along the left pericolic gutter (series 2/image 78), previously 2.1 x 2.2 cm. Musculoskeletal: Visualized osseous structures are within normal limits. IMPRESSION:  Status post left hemicolectomy. Suspected residual/recurrent tumor along the suture line with possible posterior tension into the presacral region and anterior extension involving the left vaginal fornix. 2.7 cm peritoneal implant along the left pericolic gutter, mildly progressive. No evidence of metastatic disease in the  chest. 3 mm subpleural nodule in the anterior/inferior right upper lobe is improved, likely reflecting post infectious/inflammatory scarring. Stable 2.6 cm enhancing lesion in the right upper kidney, suspicious for solid renal neoplasm such as renal cell carcinoma. Additional stable ancillary findings as above. Electronically Signed   By: Julian Hy M.D.   On: 01/11/2019 15:00    Medications: I have reviewed the patient's current medications.   Assessment/Plan: 1. Adenocarcinoma the distal sigmoid colon, poorly differentiated, stage II (T3 N0), status post a laparoscopic assisted sigmoid colectomy 08/09/2015  No loss of mismatch repair protein expression  Elevated preoperative CEA  Staging CT of the abdomen and pelvis 06/22/2015 with no evidence of distant metastatic disease  Mildly elevated CEA 02/23/2017And 04/17/2016  CTs chest, abdomen, and pelvis on 06/04/2016, 90m left upper lobe nodule-no comparison available, new hypodense lesion in the left liver, solid Right renal mass  MRI 06/10/2016-2 enhancing lesions in the liver consistent with metastatic disease, segment 2 and segment 5. Solid enhancing right renal lesion consistent with a renal cell carcinoma  Radiofrequency ablation of 2 liver lesions on 07/25/2016  Cycle 1 adjuvant Xeloda 08/18/2016  Cycle 2 Xeloda 09/08/2016  CEA improved 09/26/2016  Cycle 3 Xeloda held 09/29/2016 due to unexplained abdominal pain,referred for CT  CT abdomen/pelvis 09/30/2016 with a long segment of bowel wall thickening involving the distal ileum; lesion left hepatic lobe similar to slightly smaller following ablation;  lesion posterior right hepatic lobe elongated favored to represent treatment tract.  Cycle 3 Xeloda 10/10/2016 (dose reduced due to drug induced enteritis following cycle 2)discontinued 10/15/2016 secondary to abdominal pain.  CT in while the emergency room with abdominal pain 10/15/2016 revealed descending colitis  Colonoscopy 10/30/2016 confirmed a mass at the: colo-colonicanastomosis  Cycle 4 Xeloda 11/03/2016  01/09/2017 status post a low anterior resection for removal of locally recurrent tumor at the sigmoid anastomosis, resection margins negative, tumor invades pericolonic soft tissue, 12 benign lymph nodes;MSI stable.  Foundation 1 testing- KRAS G13D, MSS, tumor mutation burden-3, BRAF wild-type  Cycle 5 Xeloda 02/12/2017  Cycle 6 Xeloda 03/12/2017, Xeloda dose reduced to 1000 mg twice daily on 03/18/2017  Cycle 7 Xeloda 04/09/2017  Cycle 8 Xeloda 05/12/2017  Restaging CTs 06/24/2017-no evidence of metastatic disease involving the chest. Post ablation changes in the liver. No new hepatic metastatic disease.  Restaging CTs 12/14/2017- stable liver ablation sites, indeterminate nodule adjacent to the descending colon, stable left renal mass, changes of early cirrhosis, enlarged porta hepatis node-likely reactive  MRI abdomen 04/22/2018- enlargement of 1.7 cm left paracolic gutter mass, stable 1 cm extrahepatic left liver capsule lesion, no evidence of local tumor recurrence at the ablation sites, no new liver lesions, stable right kidney mass  Colonoscopy 05/19/2018- mass in the rectum at the anastomotic site beginning at 3 cm from the anal verge, biopsy confirmed invasive adenocarcinoma  CT 06/07/2018-stable liver ablation sites, enlarging peritoneal lesion at the left pericolic gutter, stable nodule in the sigmoid mesocolon, stable prominent periportal lymph nodes, no evidence of metastatic disease to the chest  Radiation/Xeloda beginning 06/21/2018  Xeloda dose reduced to 1000 mg  twice daily days of radiationbeginning8/16/2019 due to neutropenia  Radiation/Xeloda completed 08/02/2018  CTs 10/07/2018- new 6 mm right upper lobe nodule, stable ablation changes in the left and right liver, stable superior pole right renal mass, increased soft tissue thickening the presacral region, stable 12 mm porta hepatis node, increase in size of soft tissue nodule adjacent to the descending colon-2.1 x 2.2 cm  CTs 01/11/2019-  anterior right upper lobe subpleural nodule-smaller, no suspicious lung nodules, stable ablation defects in the liver, stable right kidney mass, wall thickening at the suture line in the lower pelvis with extension to the presacral region/vaginal fornix, slight enlargement of left pericolic gutter peritoneal implant   2.History ofMicrocytic anemia-likely iron deficiency anemia secondary to colon cancer, persistent microcytic anemia;ferrous sulfate increased to twice daily 12/15/2017;hemoglobin in normal range 03/16/2018, persistent microcytosis  3.Tubular villous adenoma on the sigmoid colon resection specimen 08/09/2015  Multiple tubular adenomas removed on a colonoscopy 11/16/2015  4. Right renal mass on CT 06/04/2016-suspicious for renal cell carcinoma;stable on CT 06/24/2017;referred to urology  Stable on CT 01/11/2019  5. History ofneutropenia secondary to chemotherapy  6.Pain secondary to local recurrence of colon cancer  7.Abdominal pain. CT abdomen/pelvis 07/24/2018- inflammatory changes and mild dilatation of multiple small bowel loops in the lower abdomen and pelvis. Unchanged metastatic implant in the left paracolic gutter; unchanged periportal lymphadenopathy; hepatic steatosis; stable liver ablation site; unchanged 2.7 cm enhancing right renal mass.Resolved.   Disposition: [name] appears well.  I reviewed the CT images with her.  The left pericolic gutter implant is slightly larger, no other clear evidence of  progressive metastatic disease.  I suspect the irregular bowel habits are related to surgery and radiation, but she could have recurrent tumor involving the bowel.  She is scheduled to see Dr. Hilarie Fredrickson.  She understands no therapy will be curative.  We decided to continue observation.  She will return for an office and lab visit in 2 months.  25 minutes were spent with the patient today.  The majority of the time was used for counseling and coordination of care.  [name], MD  01/13/2019  8:47 AM

## 2019-01-13 NOTE — Addendum Note (Signed)
Addended by: Tania Ade on: 01/13/2019 11:49 AM   Modules accepted: Orders

## 2019-01-13 NOTE — Telephone Encounter (Signed)
Gave avs and calendar ° °

## 2019-01-14 ENCOUNTER — Telehealth: Payer: Self-pay | Admitting: *Deleted

## 2019-01-14 NOTE — Telephone Encounter (Signed)
-----   Message from Jerene Bears, MD sent at 01/13/2019  9:06 PM EST ----- In addition to, the flex can be a few days after OV ----- Message ----- From: Larina Bras, CMA Sent: 01/13/2019   5:03 PM EST To: Jerene Bears, MD  Will flex be in place of visit or in addition to? ----- Message ----- From: Jerene Bears, MD Sent: 01/13/2019   9:58 AM EST To: Larina Bras, CMA, Ladell Pier, MD  I can certainly do that to check it out. Will let you know what is shows. Thanks Willis Modena See this email from Dr. Benay Spice -- she is scheduled a visit, but you can get her a flex spot JMP ----- Message ----- From: Ladell Pier, MD Sent: 01/13/2019   9:51 AM EST To: Jerene Bears, MD  You are scheduled to see her 01/25/2019 He has a history of metastatic colon cancer with a local recurrence in the rectum last year treated with Xeloda/radiation  She complains of constipation and diarrhea.  Will you consider a sigmoidoscopy to look for residual tumor?  Thanks,   [employer]

## 2019-01-20 ENCOUNTER — Telehealth: Payer: Self-pay | Admitting: Internal Medicine

## 2019-01-20 NOTE — Telephone Encounter (Signed)
Nichole Cross I think you scheduled this appt?

## 2019-01-20 NOTE — Telephone Encounter (Signed)
Pls call pt, she just called because she received a reminder call for her flex sig scheduled on 3/12. Pt states that she did know about this proc and wants to know more about it.

## 2019-01-20 NOTE — Telephone Encounter (Signed)
I have spoken to patient to advise that we are holding a place for sigmoidoscopy after speaking with her more about this at her office visit on 01/25/2019. She verbalizes understanding.

## 2019-01-25 ENCOUNTER — Encounter: Payer: Self-pay | Admitting: Internal Medicine

## 2019-01-25 ENCOUNTER — Ambulatory Visit (INDEPENDENT_AMBULATORY_CARE_PROVIDER_SITE_OTHER): Payer: Medicare Other | Admitting: Internal Medicine

## 2019-01-25 VITALS — BP 104/66 | HR 68 | Ht 67.0 in | Wt 145.5 lb

## 2019-01-25 DIAGNOSIS — R198 Other specified symptoms and signs involving the digestive system and abdomen: Secondary | ICD-10-CM | POA: Diagnosis not present

## 2019-01-25 DIAGNOSIS — C189 Malignant neoplasm of colon, unspecified: Secondary | ICD-10-CM | POA: Diagnosis not present

## 2019-01-25 NOTE — Patient Instructions (Signed)
You have been scheduled for a flexible sigmoidoscopy. Please follow the written instructions given to you at your visit today. If you use inhalers (even only as needed), please bring them with you on the day of your procedure.  Please discontinue Miralax.  Please purchase the following medications over the counter and take as directed: Benefiber 1 tablespoon dissolved in at least 8 ounces water/juice twice daily  You may take imodium (OTC) twice daily as needed for diarrhea.  If you are age 95 or older, your body mass index should be between 23-30. Your Body mass index is 22.79 kg/m. If this is out of the aforementioned range listed, please consider follow up with your Primary Care Provider.  If you are age 87 or younger, your body mass index should be between 19-25. Your Body mass index is 22.79 kg/m. If this is out of the aformentioned range listed, please consider follow up with your Primary Care Provider.

## 2019-01-26 ENCOUNTER — Encounter: Payer: Self-pay | Admitting: Internal Medicine

## 2019-01-26 ENCOUNTER — Encounter: Payer: Self-pay | Admitting: Radiology

## 2019-01-26 NOTE — Addendum Note (Signed)
Addended by: Larina Bras on: 01/26/2019 02:49 PM   Modules accepted: Orders

## 2019-01-26 NOTE — Progress Notes (Signed)
Subjective:    Patient ID: Nichole Cross, female    DOB: 1941/05/16, 78 y.o.   MRN: [id number]  HPI Nichole Cross is a 78 year old female with a history of adenocarcinoma of the sigmoid colon status post sigmoid colectomy in 2016 with liver metastasis, recurrent adenocarcinoma of the colon at the distal sigmoid anastomotic site in July 2019, peritoneal lesion in the left paracolic gutter who follows closely with Dr. Benay Spice and has been on Xeloda plus recurrent radiation, history of microcytic anemia who is seen in follow-up.  She is here today with her daughter.  Her last colonoscopy was performed by Dr. Rachael Darby on 05/19/2018.  This showed a prior end-to-end colocolonic anastomosis in the proximal rectum.  This was traversed.  There was a recurrent tumor at this location which was partially circumferential in about 4 cm in length.  She has been receiving surveillance imaging and last saw Dr. Benay Spice on 01/13/2019.  There appears to be recurrent/existing tumor at the surgical anastomosis by this imaging.  The left paracolic gutter implant has grown in size.  She is also been having irregular bowel movements and that is what she is here today to discuss.  Dr. Benay Spice contact me to request consideration of flexible sigmoidoscopy.  She reports that she has been having both diarrhea and constipation.  It seems to be somewhat cyclical where she will feel constipated for 1 or 2 days and then eventually have a bowel movement which will be copiously loose and take multiple times to completely empty.  The cycle will then restart.  Some days bowel movements seem to be more normal.  She has a hard time eating certain foods like [place] food, pizza and salads because they seem to "run right through me".  She has not seen rectal bleeding and she denies abdominal pain.  She will feel rectal discomfort when she is constipated.  She has been using MiraLAX and Imodium and going back and forth between the 2  medications.   Review of Systems As per HPI, otherwise negative  Current Medications, Allergies, Past Medical History, Past Surgical History, Family History and Social History were reviewed in [employer] record.     Objective:   Physical Exam BP 104/66    Pulse 68    Ht 5\' 7"  (1.702 m)    Wt 145 lb 8 oz (66 kg)    BMI 22.79 kg/m  Gen: awake, alert, NAD HEENT: anicteric, op clear CV: RRR, no mrg Pulm: CTA b/l Abd: soft, NT/ND, +BS throughout Ext: no c/c/e Neuro: nonfocal  CT CHEST, ABDOMEN, AND PELVIS WITH CONTRAST   TECHNIQUE: Multidetector CT imaging of the chest, abdomen and pelvis was performed following the standard protocol during bolus administration of intravenous contrast.   CONTRAST:  12mL OMNIPAQUE IOHEXOL 300 MG/ML  SOLN   COMPARISON:  10/07/2018   FINDINGS: CT CHEST FINDINGS   Cardiovascular: Heart is normal in size.  No pericardial effusion.   No evidence thoracic aortic aneurysm. Mild atherosclerotic calcifications of the aortic arch.   Mediastinum/Nodes: No suspicious mediastinal lymphadenopathy.   Lungs/Pleura: Mild biapical pleural-parenchymal scarring.   3 mm subpleural nodule in the anterior/inferior right upper lobe (series 6/image 94), previously 6 mm, favoring post infectious/inflammatory scarring.   No new/suspicious pulmonary nodules.   No focal consolidation.   No pleural effusion or pneumothorax.   Musculoskeletal: Visualized osseous structures are within normal limits.   CT ABDOMEN PELVIS FINDINGS   Hepatobiliary: Post ablation changes in segment 2 (series  2/image 59) and segment 6 (series 2/image 64) of the liver, unchanged. Nodular hepatic contour.   Gallbladder is unremarkable. No intrahepatic or extrahepatic ductal dilatation.   Pancreas: Within normal limits.   Spleen: 8 mm probable splenic cyst (series 2/image 61), unchanged.   Adrenals/Urinary Tract: Stable 1.9 cm nodule in the medial  left adrenal gland (series 2/image 59).   Right adrenal gland is within normal limits.   Stable 2.6 cm enhancing lesion in the right upper kidney (series 2/image 62), suspicious for solid renal neoplasm such as renal cell carcinoma.   Left kidney is within normal limits.  No hydronephrosis.   Thick-walled bladder, although underdistended.   Stomach/Bowel: Stomach is within normal limits.   No evidence of bowel obstruction.   Prior appendectomy.   Status post left hemicolectomy with suture line in the lower pelvis (series 2/image 117). Adjacent wall thickening may reflect residual/recurrent tumor (series 2/image 115), including posterior extension to the presacral region (series 2/image 116) and anterior extension involving the left vaginal fornix (series 2/image 117).   Vascular/Lymphatic: No evidence of abdominal aortic aneurysm.   No suspicious abdominopelvic lymphadenopathy. 11 mm short axis portacaval node (series 2/image 62), within normal limits.   Reproductive: Status post hysterectomy.   Possible abnormal soft tissue involving the left vaginal fornix, as described above.   No adnexal masses.   Other: No abdominopelvic ascites.   2.7 x 2.7 cm peritoneal implant along the left pericolic gutter (series 2/image 78), previously 2.1 x 2.2 cm.   Musculoskeletal: Visualized osseous structures are within normal limits.   IMPRESSION: Status post left hemicolectomy. Suspected residual/recurrent tumor along the suture line with possible posterior tension into the presacral region and anterior extension involving the left vaginal fornix.   2.7 cm peritoneal implant along the left pericolic gutter, mildly progressive.   No evidence of metastatic disease in the chest. 3 mm subpleural nodule in the anterior/inferior right upper lobe is improved, likely reflecting post infectious/inflammatory scarring.   Stable 2.6 cm enhancing lesion in the right upper kidney,  suspicious for solid renal neoplasm such as renal cell carcinoma.   Additional stable ancillary findings as above.     Electronically Signed   By: Julian Hy M.D.   On: 01/11/2019 15:00      Assessment & Plan:  78 year old female with a history of adenocarcinoma of the sigmoid colon status post sigmoid colectomy in 2016 with liver metastasis, recurrent adenocarcinoma of the colon at the distal sigmoid anastomotic site in July 2019, peritoneal lesion in the left paracolic gutter who follows closely with Dr. Benay Spice and has been on Xeloda plus recurrent radiation, history of microcytic anemia who is seen in follow-up.  1.  History of metastatic colon cancer with recurrence at colocolonic anastomosis in the proximal rectum/donating bowel habits --we spent considerable time today discussing her bowel habits and it is likely that her alternating between the laxative and an antidiarrheal is not the best thing for consistent bowel movements.  I am concerned that the local recurrence at the colocolonic anastomosis is also contributing to her bowel difficulties.  I recommended that we proceed with flexible sigmoidoscopy to evaluate the left colon/and anastomosis.  Have also recommended that she discontinue MiraLAX and Imodium at this time.  Begin Benefiber 1 heaping tablespoon twice daily to try to bulk stool and prevent both constipation and diarrhea.  If she has to she can still use Imodium 2 mg, 1 to 2 tablets as needed.  16 mg maximum per day.  We discussed the risk, benefits and alternatives to flexible sigmoidoscopy and she is agreeable and wishes to proceed.  We discussed sedated versus unsedated and she prefers sedation.  30 minutes spent with the patient today. Greater than 50% was spent in counseling and coordination of care with the patient

## 2019-01-27 ENCOUNTER — Other Ambulatory Visit: Payer: Self-pay

## 2019-01-27 ENCOUNTER — Telehealth: Payer: Self-pay | Admitting: Internal Medicine

## 2019-01-27 ENCOUNTER — Ambulatory Visit (AMBULATORY_SURGERY_CENTER): Payer: Medicare Other | Admitting: Internal Medicine

## 2019-01-27 ENCOUNTER — Encounter: Payer: Self-pay | Admitting: Internal Medicine

## 2019-01-27 VITALS — BP 104/59 | HR 16 | Temp 97.7°F | Resp 16 | Ht 67.0 in | Wt 145.0 lb

## 2019-01-27 DIAGNOSIS — C20 Malignant neoplasm of rectum: Secondary | ICD-10-CM | POA: Diagnosis not present

## 2019-01-27 DIAGNOSIS — Z85048 Personal history of other malignant neoplasm of rectum, rectosigmoid junction, and anus: Secondary | ICD-10-CM

## 2019-01-27 DIAGNOSIS — C189 Malignant neoplasm of colon, unspecified: Secondary | ICD-10-CM | POA: Diagnosis not present

## 2019-01-27 MED ORDER — SODIUM CHLORIDE 0.9 % IV SOLN: 500.0000 mL | Freq: Once | INTRAVENOUS | Status: DC

## 2019-01-27 NOTE — Op Note (Addendum)
[facility] Patient Name: Nichole Cross Procedure Date: 01/27/2019 8:32 AM MRN: [id number] Endoscopist: Jerene Bears , MD Age: 78 Referring MD:  Date of Birth: [birth year] Gender: Female Account #: [id number] Procedure:                Flexible Sigmoidoscopy Indications:              Personal history of malignant neoplasm of the colon                            s/p segmental left colon resection with local                            recurrence at colo-colonic anastomosis seen at last                            colonoscopy in July 2019 Medicines:                Monitored Anesthesia Care Procedure:                Pre-Anesthesia Assessment:                           - Prior to the procedure, a History and Physical                            was performed, and patient medications and                            allergies were reviewed. The patient's tolerance of                            previous anesthesia was also reviewed. The risks                            and benefits of the procedure and the sedation                            options and risks were discussed with the patient.                            All questions were answered, and informed consent                            was obtained. Prior Anticoagulants: The patient has                            taken no previous anticoagulant or antiplatelet                            agents. ASA Grade Assessment: III - A patient with                            severe systemic disease. After reviewing the risks  and benefits, the patient was deemed in                            satisfactory condition to undergo the procedure.                           After obtaining informed consent, the scope was                            passed under direct vision. The Colonoscope was                            introduced through the anus and advanced to the                            rectum. The flexible  sigmoidoscopy was accomplished                            without difficulty. The patient tolerated the                            procedure well. The quality of the bowel                            preparation was adequate. Scope In: Scope Out: Findings:                 The digital rectal exam revealed a nodular rectal                            mass.                           A fungating partially obstructing large mass was                            found in the rectum. This begins approximately 5 cm                            from the dentate line. The mass was                            circumferential. No bleeding was present. This                            causes significant luminal narrowing and the                            pediatric colonoscopy will not pass. This was                            biopsied with a cold forceps for histology.                           Retroflexion in the rectum was not performed  due to                            anatomy. Complications:            No immediate complications. Estimated Blood Loss:     Estimated blood loss was minimal. Impression:               - Malignant partially obstructing tumor in the                            rectum. Biopsied. This is the cause of alternating                            bowel movements. Recommendation:           - Patient has a contact number available for                            emergencies. The signs and symptoms of potential                            delayed complications were discussed with the                            patient. Return to normal activities tomorrow.                            Written discharge instructions were provided to the                            patient.                           - Resume previous diet.                           - Continue present medications. Will likely need                            MiraLax daily (rather than fiber) in light of                             findings.                           - Surgical diversion may be necessary.                           - Patient will followup with Dr. Benay Spice and Dr.                            Marcello Moores. Jerene Bears, MD 01/27/2019 9:06:22 AM This report has been signed electronically.

## 2019-01-27 NOTE — Progress Notes (Signed)
Report to PACU, RN, vss, BBS= Clear.  

## 2019-01-27 NOTE — Telephone Encounter (Signed)
Pt's daughter [name] would like to speak with Dr. Hilarie Fredrickson. Pt had a procedure with him this morning and she has some questions. She is aware that he is at [facility] all morning and stated that he can call her when he has a chance.

## 2019-01-27 NOTE — Telephone Encounter (Signed)
I spoke with the patient's daughter [name] I let her know findings from colonoscopy which includes growing rectal mass causing significant luminal narrowing and her recent changes in bowel habit diarrhea/constipation and rectal pain I think it best that she meet back with Dr. Benay Spice and with Dr. Marcello Moores to consider diverting colostomy which should improve bowel symptoms and quality of life

## 2019-01-27 NOTE — Progress Notes (Signed)
Called to room to assist during endoscopic procedure.  Patient ID and intended procedure confirmed with present staff. Received instructions for my participation in the procedure from the performing physician.  

## 2019-01-27 NOTE — Telephone Encounter (Signed)
Noted  

## 2019-01-28 ENCOUNTER — Telehealth: Payer: Self-pay | Admitting: *Deleted

## 2019-01-28 ENCOUNTER — Telehealth: Payer: Self-pay | Admitting: Oncology

## 2019-01-28 NOTE — Telephone Encounter (Signed)
  Follow up Call-  Call back number 01/27/2019 05/19/2018 10/30/2016  Post procedure Call Back phone  # [phone]  Permission to leave phone message Yes Yes Yes  Some recent data might be hidden     Patient questions:  Do you have a fever, pain , or abdominal swelling? No. Pain Score  0 *  Have you tolerated food without any problems? Yes.    Have you been able to return to your normal activities? Yes.    Do you have any questions about your discharge instructions: Diet   No. Medications  No. Follow up visit  No.  Do you have questions or concerns about your Care? No.  Actions: * If pain score is 4 or above: No action needed, pain <4.

## 2019-01-28 NOTE — Telephone Encounter (Signed)
Left message for patient re 3/20 f/u. Schedule mailed.

## 2019-02-04 ENCOUNTER — Telehealth: Payer: Self-pay | Admitting: Nurse Practitioner

## 2019-02-04 ENCOUNTER — Other Ambulatory Visit: Payer: Self-pay

## 2019-02-04 ENCOUNTER — Inpatient Hospital Stay: Payer: Medicare Other | Attending: Oncology | Admitting: Nurse Practitioner

## 2019-02-04 VITALS — BP 100/77 | HR 90 | Temp 98.4°F | Resp 18 | Ht 67.0 in | Wt 143.4 lb

## 2019-02-04 DIAGNOSIS — C787 Secondary malignant neoplasm of liver and intrahepatic bile duct: Secondary | ICD-10-CM

## 2019-02-04 DIAGNOSIS — D509 Iron deficiency anemia, unspecified: Secondary | ICD-10-CM | POA: Diagnosis not present

## 2019-02-04 DIAGNOSIS — D701 Agranulocytosis secondary to cancer chemotherapy: Secondary | ICD-10-CM

## 2019-02-04 DIAGNOSIS — C187 Malignant neoplasm of sigmoid colon: Secondary | ICD-10-CM

## 2019-02-04 DIAGNOSIS — C189 Malignant neoplasm of colon, unspecified: Secondary | ICD-10-CM

## 2019-02-04 MED ORDER — HYDROCODONE-ACETAMINOPHEN 5-325 MG PO TABS: 1.0000 | ORAL_TABLET | Freq: Four times a day (QID) | ORAL | 0 refills | Status: DC | PRN

## 2019-02-04 NOTE — Telephone Encounter (Signed)
Per 3/20 los did not schedule the MD visit with lisa for 04/03 since the patient already had an appt wioth sherrill on 04/02.  Benay Spice said that was fine.  Printed calendar and avs.

## 2019-02-04 NOTE — Progress Notes (Signed)
[facility] OFFICE PROGRESS NOTE   Diagnosis: Colon cancer  INTERVAL HISTORY:   Nichole Cross returns as scheduled.  She was taken to a sigmoidoscopy by Dr. Hilarie Fredrickson on 01/27/2019.  A nodular mass was noted on digital exam.  A partially obstructing mass was found in the rectum beginning at 5 cm from the dentate line.  A biopsy revealed adenocarcinoma.  She complains of alternating diarrhea and constipation.  She is taking MiraLAX.  No bleeding.  She has rectal pain. No pain at other sites.  She otherwise feels well.  Objective:  Vital signs in last 24 hours:  Blood pressure 100/77, pulse 90, temperature 98.4 F (36.9 C), temperature source Oral, resp. rate 18, height _0  (1.702 m), weight 143 lb 6.4 oz (65 kg), SpO2 100 %.    HEENT: Neck without mass Lymphatics: No cervical, supraclavicular, axillary, or inguinal nodes Resp: Lungs with fine end inspiratory rales at the lower left posterior chest, no respiratory distress Cardio: Regular rate and rhythm GI: No hepatomegaly, no mass, nontender Vascular: No leg edema  Lab Results:  Lab Results  Component Value Date   WBC 2.7 (L) 10/07/2018   HGB 12.1 10/07/2018   HCT 37.3 10/07/2018   MCV 94.4 10/07/2018   PLT 277 10/07/2018   NEUTROABS 1.5 (L) 10/07/2018    CMP  Lab Results  Component Value Date   NA 134 (L) 01/11/2019   K 4.7 01/11/2019   CL 100 01/11/2019   CO2 26 01/11/2019   GLUCOSE 99 01/11/2019   BUN 16 01/11/2019   CREATININE 1.24 (H) 01/11/2019   CALCIUM 9.7 01/11/2019   PROT 7.5 01/11/2019   ALBUMIN 3.6 01/11/2019   AST 24 01/11/2019   ALT 7 01/11/2019   ALKPHOS 86 01/11/2019   BILITOT 0.4 01/11/2019   GFRNONAA 42 (L) 01/11/2019   GFRAA 49 (L) 01/11/2019    Lab Results  Component Value Date   CEA1 39.79 (H) 12/14/2018   Medications: I have reviewed the patient's current medications.   Assessment/Plan: 1. Adenocarcinoma the distal sigmoid colon, poorly differentiated, stage II (T3  N0), status post a laparoscopic assisted sigmoid colectomy 08/09/2015  No loss of mismatch repair protein expression  Elevated preoperative CEA  Staging CT of the abdomen and pelvis 06/22/2015 with no evidence of distant metastatic disease  Mildly elevated CEA 02/23/2017And 04/17/2016  CTs chest, abdomen, and pelvis on 06/04/2016, 61m left upper lobe nodule-no comparison available, new hypodense lesion in the left liver, solid Right renal mass  MRI 06/10/2016-2 enhancing lesions in the liver consistent with metastatic disease, segment 2 and segment 5. Solid enhancing right renal lesion consistent with a renal cell carcinoma  Radiofrequency ablation of 2 liver lesions on 07/25/2016  Cycle 1 adjuvant Xeloda 08/18/2016  Cycle 2 Xeloda 09/08/2016  CEA improved 09/26/2016  Cycle 3 Xeloda held 09/29/2016 due to unexplained abdominal pain,referred for CT  CT abdomen/pelvis 09/30/2016 with a long segment of bowel wall thickening involving the distal ileum; lesion left hepatic lobe similar to slightly smaller following ablation; lesion posterior right hepatic lobe elongated favored to represent treatment tract.  Cycle 3 Xeloda 10/10/2016 (dose reduced due to drug induced enteritis following cycle 2)discontinued 10/15/2016 secondary to abdominal pain.  CT in while the emergency room with abdominal pain 10/15/2016 revealed descending colitis  Colonoscopy 10/30/2016 confirmed a mass at the: colo-colonicanastomosis  Cycle 4 Xeloda 11/03/2016  01/09/2017 status post a low anterior resection for removal of locally recurrent tumor at the sigmoid anastomosis, resection margins  negative, tumor invades pericolonic soft tissue, 12 benign lymph nodes;MSI stable.  Foundation 1 testing- KRAS G13D, MSS, tumor mutation burden-3, BRAF wild-type  Cycle 5 Xeloda 02/12/2017  Cycle 6 Xeloda 03/12/2017, Xeloda dose reduced to 1000 mg twice daily on 03/18/2017  Cycle 7 Xeloda 04/09/2017  Cycle 8  Xeloda 05/12/2017  Restaging CTs 06/24/2017-no evidence of metastatic disease involving the chest. Post ablation changes in the liver. No new hepatic metastatic disease.  Restaging CTs 12/14/2017- stable liver ablation sites, indeterminate nodule adjacent to the descending colon, stable left renal mass, changes of early cirrhosis, enlarged porta hepatis node-likely reactive  MRI abdomen 04/22/2018- enlargement of 1.7 cm left paracolic gutter mass, stable 1 cm extrahepatic left liver capsule lesion, no evidence of local tumor recurrence at the ablation sites, no new liver lesions, stable right kidney mass  Colonoscopy 05/19/2018- mass in the rectum at the anastomotic site beginning at 3 cm from the anal verge, biopsy confirmed invasive adenocarcinoma  CT 06/07/2018-stable liver ablation sites, enlarging peritoneal lesion at the left pericolic gutter, stable nodule in the sigmoid mesocolon, stable prominent periportal lymph nodes, no evidence of metastatic disease to the chest  Radiation/Xeloda beginning 06/21/2018  Xeloda dose reduced to 1000 mg twice daily days of radiationbeginning8/16/2019 due to neutropenia  Radiation/Xeloda completed 08/02/2018  CTs 10/07/2018- new 6 mm right upper lobe nodule, stable ablation changes in the left and right liver, stable superior pole right renal mass, increased soft tissue thickening the presacral region, stable 12 mm porta hepatis node, increase in size of soft tissue nodule adjacent to the descending colon-2.1 x 2.2 cm  CTs 01/11/2019- anterior right upper lobe subpleural nodule-smaller, no suspicious lung nodules, stable ablation defects in the liver, stable right kidney mass, wall thickening at the suture line in the lower pelvis with extension to the presacral region/vaginal fornix, slight enlargement of left pericolic gutter peritoneal implant  Sigmoidoscopy 01/27/2019- mass at 5 cm from the anal verge, biopsy confirmed adenocarcinoma   2.History  ofMicrocytic anemia-likely iron deficiency anemia secondary to colon cancer, persistent microcytic anemia;ferrous sulfate increased to twice daily 12/15/2017;hemoglobin in normal range 03/16/2018, persistent microcytosis  3.Tubular villous adenoma on the sigmoid colon resection specimen 08/09/2015  Multiple tubular adenomas removed on a colonoscopy 11/16/2015  4. Right renal mass on CT 06/04/2016-suspicious for renal cell carcinoma;stable on CT 06/24/2017;referred to urology  Stable on CT 01/11/2019  5. History ofneutropenia secondary to chemotherapy  6.Pain secondary to local recurrence of colon cancer  7.Abdominal pain. CT abdomen/pelvis 07/24/2018- inflammatory changes and mild dilatation of multiple small bowel loops in the lower abdomen and pelvis. Unchanged metastatic implant in the left paracolic gutter; unchanged periportal lymphadenopathy; hepatic steatosis; stable liver ablation site; unchanged 2.7 cm enhancing right renal mass.Resolved.     Disposition: Ms. Woolworth has a history of metastatic colon cancer.  She has local progression of disease at the rectum.  She has previously undergone a low anterior resection for recurrent disease at the sigmoid anastomosis and radiation/Xeloda for subsequent local progression.  I discussed treatment options with Ms. Goyer and her daughter.  They understand no therapy will be curative.  We discussed a diverting colostomy or potentially an APR for palliation of rectal symptoms.  She understands surgery will likely not impact on survival, but would hopefully improve her quality of life.  We discussed systemic treatment options.  I recommend FOLFOX if she develops systemic progression or decides against surgery.  She will return for an office visit in 2 weeks.  I will contact  Dr. Marcello Moores to see if she can see her in the interim.  She will try hydrocodone for pain.  [name], MD  02/04/2019  12:36 PM

## 2019-02-07 DIAGNOSIS — C20 Malignant neoplasm of rectum: Secondary | ICD-10-CM

## 2019-02-07 NOTE — Progress Notes (Signed)
Referral for surgical consult to [facility] - Dr. Marcello Moores placed. In-basket message sent to [facility]  LPN and Mila Merry, new patient scheduler at [facility].

## 2019-02-14 DIAGNOSIS — C189 Malignant neoplasm of colon, unspecified: Secondary | ICD-10-CM | POA: Diagnosis not present

## 2019-02-15 ENCOUNTER — Other Ambulatory Visit: Payer: Self-pay

## 2019-02-15 ENCOUNTER — Inpatient Hospital Stay: Payer: Medicare Other

## 2019-02-15 ENCOUNTER — Ambulatory Visit (HOSPITAL_COMMUNITY): Payer: Medicare Other

## 2019-02-15 DIAGNOSIS — D701 Agranulocytosis secondary to cancer chemotherapy: Secondary | ICD-10-CM | POA: Diagnosis not present

## 2019-02-15 DIAGNOSIS — D509 Iron deficiency anemia, unspecified: Secondary | ICD-10-CM | POA: Diagnosis not present

## 2019-02-15 DIAGNOSIS — C787 Secondary malignant neoplasm of liver and intrahepatic bile duct: Principal | ICD-10-CM

## 2019-02-15 DIAGNOSIS — C189 Malignant neoplasm of colon, unspecified: Secondary | ICD-10-CM

## 2019-02-15 DIAGNOSIS — C187 Malignant neoplasm of sigmoid colon: Secondary | ICD-10-CM | POA: Diagnosis not present

## 2019-02-15 LAB — CMP (CANCER CENTER ONLY)
ALT: 9 U/L (ref 0–44)
AST: 19 U/L (ref 15–41)
Albumin: 3.1 g/dL — ABNORMAL LOW (ref 3.5–5.0)
Alkaline Phosphatase: 103 U/L (ref 38–126)
Anion gap: 9 (ref 5–15)
BUN: 11 mg/dL (ref 8–23)
CO2: 26 mmol/L (ref 22–32)
Calcium: 8.7 mg/dL — ABNORMAL LOW (ref 8.9–10.3)
Chloride: 101 mmol/L (ref 98–111)
Creatinine: 1.12 mg/dL — ABNORMAL HIGH (ref 0.44–1.00)
GFR, Est AFR Am: 56 mL/min — ABNORMAL LOW (ref >60)
GFR, Estimated: 48 mL/min — ABNORMAL LOW (ref >60)
Glucose, Bld: 89 mg/dL (ref 70–99)
Potassium: 4.4 mmol/L (ref 3.5–5.1)
Sodium: 136 mmol/L (ref 135–145)
Total Bilirubin: 0.4 mg/dL (ref 0.3–1.2)
Total Protein: 6.7 g/dL (ref 6.5–8.1)

## 2019-02-15 LAB — CEA (IN HOUSE-CHCC): CEA (CHCC-In House): 89.95 ng/mL — ABNORMAL HIGH (ref 0.00–5.00)

## 2019-02-17 ENCOUNTER — Inpatient Hospital Stay: Payer: Medicare Other | Attending: Oncology | Admitting: Oncology

## 2019-02-17 ENCOUNTER — Other Ambulatory Visit: Payer: Self-pay

## 2019-02-17 VITALS — BP 117/64 | HR 69 | Temp 97.5°F | Resp 18 | Ht 67.0 in | Wt 145.1 lb

## 2019-02-17 DIAGNOSIS — C187 Malignant neoplasm of sigmoid colon: Secondary | ICD-10-CM

## 2019-02-17 DIAGNOSIS — D509 Iron deficiency anemia, unspecified: Secondary | ICD-10-CM

## 2019-02-17 DIAGNOSIS — C20 Malignant neoplasm of rectum: Secondary | ICD-10-CM

## 2019-02-17 DIAGNOSIS — G893 Neoplasm related pain (acute) (chronic): Secondary | ICD-10-CM

## 2019-02-17 NOTE — Progress Notes (Signed)
[place] OFFICE PROGRESS NOTE   Diagnosis: Colon cancer  INTERVAL HISTORY:   Nichole Cross returns for a scheduled visit.  She continues to have rectal urgency.  No pain or bleeding.  No other complaint. She saw Dr. Marcello Moores.  Dr. Marcello Moores recommends a diverting colostomy.  Objective:  Physical examination-not performed today  Lab Results:  Lab Results  Component Value Date   WBC 2.7 (L) 10/07/2018   HGB 12.1 10/07/2018   HCT 37.3 10/07/2018   MCV 94.4 10/07/2018   PLT 277 10/07/2018   NEUTROABS 1.5 (L) 10/07/2018    CMP  Lab Results  Component Value Date   NA 136 02/15/2019   K 4.4 02/15/2019   CL 101 02/15/2019   CO2 26 02/15/2019   GLUCOSE 89 02/15/2019   BUN 11 02/15/2019   CREATININE 1.12 (H) 02/15/2019   CALCIUM 8.7 (L) 02/15/2019   PROT 6.7 02/15/2019   ALBUMIN 3.1 (L) 02/15/2019   AST 19 02/15/2019   ALT 9 02/15/2019   ALKPHOS 103 02/15/2019   BILITOT 0.4 02/15/2019   GFRNONAA 48 (L) 02/15/2019   GFRAA 56 (L) 02/15/2019    Lab Results  Component Value Date   CEA1 89.95 (H) 02/15/2019     Medications: I have reviewed the patient's current medications.   Assessment/Plan: 1. Adenocarcinoma the distal sigmoid colon, poorly differentiated, stage II (T3 N0), status post a laparoscopic assisted sigmoid colectomy 08/09/2015  No loss of mismatch repair protein expression  Elevated preoperative CEA  Staging CT of the abdomen and pelvis 06/22/2015 with no evidence of distant metastatic disease  Mildly elevated CEA 02/23/2017And 04/17/2016  CTs chest, abdomen, and pelvis on 06/04/2016, 76m left upper lobe nodule-no comparison available, new hypodense lesion in the left liver, solid Right renal mass  MRI 06/10/2016-2 enhancing lesions in the liver consistent with metastatic disease, segment 2 and segment 5. Solid enhancing right renal lesion consistent with a renal cell carcinoma  Radiofrequency ablation of 2 liver lesions on 07/25/2016   Cycle 1 adjuvant Xeloda 08/18/2016  Cycle 2 Xeloda 09/08/2016  CEA improved 09/26/2016  Cycle 3 Xeloda held 09/29/2016 due to unexplained abdominal pain,referred for CT  CT abdomen/pelvis 09/30/2016 with a long segment of bowel wall thickening involving the distal ileum; lesion left hepatic lobe similar to slightly smaller following ablation; lesion posterior right hepatic lobe elongated favored to represent treatment tract.  Cycle 3 Xeloda 10/10/2016 (dose reduced due to drug induced enteritis following cycle 2)discontinued 10/15/2016 secondary to abdominal pain.  CT in while the emergency room with abdominal pain 10/15/2016 revealed descending colitis  Colonoscopy 10/30/2016 confirmed a mass at the: colo-colonicanastomosis  Cycle 4 Xeloda 11/03/2016  01/09/2017 status post a low anterior resection for removal of locally recurrent tumor at the sigmoid anastomosis, resection margins negative, tumor invades pericolonic soft tissue, 12 benign lymph nodes;MSI stable.  Foundation 1 testing- KRAS G13D, MSS, tumor mutation burden-3, BRAF wild-type  Cycle 5 Xeloda 02/12/2017  Cycle 6 Xeloda 03/12/2017, Xeloda dose reduced to 1000 mg twice daily on 03/18/2017  Cycle 7 Xeloda 04/09/2017  Cycle 8 Xeloda 05/12/2017  Restaging CTs 06/24/2017-no evidence of metastatic disease involving the chest. Post ablation changes in the liver. No new hepatic metastatic disease.  Restaging CTs 12/14/2017- stable liver ablation sites, indeterminate nodule adjacent to the descending colon, stable left renal mass, changes of early cirrhosis, enlarged porta hepatis node-likely reactive  MRI abdomen 04/22/2018- enlargement of 1.7 cm left paracolic gutter mass, stable 1 cm extrahepatic left liver capsule lesion,  no evidence of local tumor recurrence at the ablation sites, no new liver lesions, stable right kidney mass  Colonoscopy 05/19/2018- mass in the rectum at the anastomotic site beginning at 3 cm from the  anal verge, biopsy confirmed invasive adenocarcinoma  CT 06/07/2018-stable liver ablation sites, enlarging peritoneal lesion at the left pericolic gutter, stable nodule in the sigmoid mesocolon, stable prominent periportal lymph nodes, no evidence of metastatic disease to the chest  Radiation/Xeloda beginning 06/21/2018  Xeloda dose reduced to 1000 mg twice daily days of radiationbeginning8/16/2019 due to neutropenia  Radiation/Xeloda completed 08/02/2018  CTs 10/07/2018- new 6 mm right upper lobe nodule, stable ablation changes in the left and right liver, stable superior pole right renal mass, increased soft tissue thickening the presacral region, stable 12 mm porta hepatis node, increase in size of soft tissue nodule adjacent to the descending colon-2.1 x 2.2 cm  CTs 01/11/2019- anterior right upper lobe subpleural nodule-smaller, no suspicious lung nodules, stable ablation defects in the liver, stable right kidney mass, wall thickening at the suture line in the lower pelvis with extension to the presacral region/vaginal fornix, slight enlargement of left pericolic gutter peritoneal implant  Sigmoidoscopy 01/27/2019- mass at 5 cm from the anal verge, biopsy confirmed adenocarcinoma   2.History ofMicrocytic anemia-likely iron deficiency anemia secondary to colon cancer, persistent microcytic anemia;ferrous sulfate increased to twice daily 12/15/2017;hemoglobin in normal range 03/16/2018, persistent microcytosis  3.Tubular villous adenoma on the sigmoid colon resection specimen 08/09/2015  Multiple tubular adenomas removed on a colonoscopy 11/16/2015  4. Right renal mass on CT 06/04/2016-suspicious for renal cell carcinoma;stable on CT 06/24/2017;referred to urology  Stable on CT 01/11/2019  5. History ofneutropenia secondary to chemotherapy  6.Pain secondary to local recurrence of colon cancer  7.Abdominal pain. CT abdomen/pelvis 07/24/2018- inflammatory changes  and mild dilatation of multiple small bowel loops in the lower abdomen and pelvis. Unchanged metastatic implant in the left paracolic gutter; unchanged periportal lymphadenopathy; hepatic steatosis; stable liver ablation site; unchanged 2.7 cm enhancing right renal mass.Resolved.     Disposition: Nichole Cross has metastatic colon cancer.  She has developed disease progression in the rectum.  I discussed treatment options with Nichole Cross and with her daughter by telephone.  They understand no therapy will be curative.  Dr. Marcello Moores recommends a palliative diverting colostomy.  Nichole Cross is to proceed.  She has systemic disease involving the paracolic gutter based on restaging CTs.  She likely has other sites of subclinical disease.  I will recommend systemic chemotherapy after the diverting colostomy.  We discussed the need for Port-A-Cath placement.  She agrees to a Port-A-Cath.  I will asked Dr. Marcello Moores to place a Port-A-Cath.  I anticipate she will undergo surgery within the next few weeks.  She will return for an office visit here in 4-5 weeks.  25 minutes were spent with the patient today.  The majority of the time was used for counseling and coordination of care.    [name], MD  02/17/2019  3:45 PM

## 2019-02-18 ENCOUNTER — Ambulatory Visit: Payer: Medicare Other | Admitting: Nurse Practitioner

## 2019-02-18 ENCOUNTER — Telehealth: Payer: Self-pay | Admitting: Oncology

## 2019-02-18 NOTE — Telephone Encounter (Signed)
Scheduled appt per 4/2 los.  Patient aware of appt date and time.

## 2019-02-21 ENCOUNTER — Ambulatory Visit: Payer: Self-pay | Admitting: General Surgery

## 2019-02-21 NOTE — H&P (View-Only) (Signed)
History of Present Illness Nichole Ruff MD; 11/25/3233 2:29 PM) The patient is a 78 year old female who presents with a liver mass. Patient is a 78 year old female who underwent a laparoscopic-assisted sigmoid colectomy in September 2016 by Dr. Zella Richer. She had no evidence of metastatic disease at the time of diagnosis. She started having a rising CEA in February 2017 which continued to rise. CTs performed in July show a new lesion in the left liver a left renal mass, and a new lesion in the right posterior liver. She underwent ablation of this in September 2017. She developed severe abdominal pain on chemotherapy in November 2017. A CT scan showed descending colitis. She underwent a colonoscopy which showed recurrent tumor at her anastomotic site. She underwent a difficult robotic low anterior resection in late February 2018. Her JP drain was left in place and drained a contained leak. She recovered from this and her drain was removed without further issue. She finished an adjuvant course of Xeloda. Earlier this year she began to develop obstructive symptoms and underwent a colonoscopy which showed a recurrent mass in her pelvis at the anastomosis site. CT scan shows a large mass in her pelvis at the level of the anastomosis as well as peritoneal implants. She denies weight loss or significant rectal bleeding.  Past Medical History:  Diagnosis Date  . Anemia   . Arthritis    knees  . Blood dyscrasia 2016   microcytic anemia, likely iron deficiency anemia secondary to colon cancer  . Cataracts, bilateral   . Colon cancer ([place]) 06/2015   tubular villous adenoma on sigmoid colon specimen (08/09/15), myltiple tubular adenomas removed on a colonoscopy  11/16/15  . Complication of anesthesia   . Fatty liver   . Headache(784.0)    migraines  . Hypothyroidism   . Liver metastasis ([place])   . Metastasis to kidney ([place])   . Osteoporosis   . PONV (postoperative nausea and vomiting)    NAUSEA  . Renal carcinoma ([place]) 06/2016   ? primary  . Secondary liver cancer ([place]) 06/2016   ? metastasis from colon   Past Surgical History:  Procedure Laterality Date  . ABDOMINAL HYSTERECTOMY  1993   with bilateral BSO  . abdominal tumor  1993  . APPENDECTOMY    . IR GENERIC HISTORICAL  07/01/2016   IR RADIOLOGIST EVAL & MGMT 07/01/2016 Greggory Keen, MD [facility] INTERV RAD  . IR GENERIC HISTORICAL  08/14/2016   IR RADIOLOGIST EVAL & MGMT 08/14/2016 Jacqulynn Cadet, MD [facility] INTERV RAD  . IR RADIOLOGIST EVAL & MGMT  12/24/2017  . IR RADIOLOGIST EVAL & MGMT  04/22/2018  . LAPAROSCOPIC PARTIAL COLECTOMY N/A 08/09/2015   Procedure: LAPAROSCOPIC PARTIAL COLECTOMY;  Surgeon: Jackolyn Confer, MD;  Location: [facility];  Service: General;  Laterality: N/A;  . PARTIAL KNEE ARTHROPLASTY  09/09/2012   Procedure: UNICOMPARTMENTAL KNEE;  Surgeon: Mauri Pole, MD;  Location: [facility];  Service: Orthopedics;  Laterality: Left;  . [place] LIVER TUMOR  2018  . XI ROBOTIC ASSISTED LOWER ANTERIOR RESECTION N/A 01/09/2017   Procedure: XI ROBOTIC ASSISTED LOWER ANTERIOR RESECTION;  Surgeon: Nichole Ruff, MD;  Location: [facility];  Service: General;  Laterality: N/A;   Social History   Socioeconomic History  . Marital status: Divorced    Spouse name: Not on file  . Number of children: Not on file  . Years of education: Not on file  . Highest education level: Not on file  Occupational History  .  Not on file  Social Needs  . Financial resource strain: Not on file  . Food insecurity:    Worry: Not on file    Inability: Not on file  . Transportation needs:    Medical: No    Non-medical: No  Tobacco Use  . Smoking status: Never Smoker  . Smokeless tobacco: Never Used  Substance and Sexual Activity  . Alcohol use: No  . Drug use: No  . Sexual activity: Not on file  Lifestyle  . Physical activity:    Days per week: Not on file    Minutes per session: Not on file  . Stress: Not on file   Relationships  . Social connections:    Talks on phone: Not on file    Gets together: Not on file    Attends religious service: Not on file    Active member of club or organization: Not on file    Attends meetings of clubs or organizations: Not on file    Relationship status: Not on file  . Intimate partner violence:    Fear of current or ex partner: Not on file    Emotionally abused: Not on file    Physically abused: Not on file    Forced sexual activity: Not on file  Other Topics Concern  . 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 Xeloda (500MG Tablet, Oral) Active. Imodium (2MG Capsule, Oral as needed) Active. MiraLax (Oral as needed) Active. Temazepam (15MG Capsule, Oral) Active. Levothyroxine Sodium (150MCG Tablet, Oral) Active. Medications Reconciled  Vitals Emeline Gins CMA; 02/14/2019 2:16 PM) 02/14/2019 2:15 PM Weight: 145.2 lb Height: 69in Body Surface Area: 1.8 m Body Mass Index: 21.44 kg/m  Temp.: 43F  Pulse: 120 (Regular)  BP: 102/70 (Sitting, Left Arm, Standard)    Physical Exam Nichole Ruff MD; 9/62/9528 2:32 PM) General Mental Status-Alert. General Appearance-Cooperative.  CV: RRR  Lungs: CTAB  Abdomen Palpation/Percussion Palpation and Percussion of the abdomen reveal - Soft and Non Tender.  Rectal Anorectal Exam Internal - Note: Mass noted approximate 4 cm from anal verge with narrowing to the tip of my index finger.    Assessment & Plan Nichole Ruff MD;  02/28/2439 2:31 PM) LOCAL RECURRENCE OF COLON CANCER (C18.9) Impression: 78 year old female who presents to the office with a recurrent colon cancer. This is her second pelvic reoperation. She also has peritoneal implants on CT scan. Given her history and findings on CT scan I do not think that she would be well served with an attempted abdominal peroneal resection. I think she would incur quite a bit of risk for virtually no benefit. We discussed a diverting ostomy as a potential palliative measure. We discussed that she may develop significant bleeding and pain in the future as this tumor continues to grow. She would like to discuss this with her oncologist later this week and she will call us when they have made a decision. We did give them an introductory ostomy kit to go over at home. All questions were answered in detail. The  surgery and anatomy were described to the patient as well as the risks of surgery and the possible complications.  These include: Bleeding, deep abdominal infections and possible wound complications such as hernia and infection, damage to adjacent structures, leak of surgical connections, which can lead to other surgeries and possibly an ostomy, possible need for other procedures, such as abscess drains in radiology, possible prolonged hospital stay, possible diarrhea from removal of part of the colon, possible constipation from narcotics, prolonged fatigue/weakness or appetite loss, possible early recurrence of of disease, possible complications of their medical problems such as heart disease or arrhythmias or lung problems, death (less than 1%). I believe the patient understands and wishes to proceed with the surgery. We will also place a port for chemotherapy.  Risks such as bleeding, infection, device malfunction, need for additional surgery and pneumothorax discussed with patient and she has agreed to proceed with port placement.

## 2019-02-21 NOTE — H&P (Signed)
History of Present Illness Nichole Ruff MD; 9/62/9528 2:29 PM) The patient is a 78 year old female who presents with a liver mass. Patient is a 78 year old female who underwent a laparoscopic-assisted sigmoid colectomy in September 2016 by Dr. Zella Richer. She had no evidence of metastatic disease at the time of diagnosis. She started having a rising CEA in February 2017 which continued to rise. CTs performed in July show a new lesion in the left liver a left renal mass, and a new lesion in the right posterior liver. She underwent ablation of this in September 2017. She developed severe abdominal pain on chemotherapy in November 2017. A CT scan showed descending colitis. She underwent a colonoscopy which showed recurrent tumor at her anastomotic site. She underwent a difficult robotic low anterior resection in late February 2018. Her JP drain was left in place and drained a contained leak. She recovered from this and her drain was removed without further issue. She finished an adjuvant course of Xeloda. Earlier this year she began to develop obstructive symptoms and underwent a colonoscopy which showed a recurrent mass in her pelvis at the anastomosis site. CT scan shows a large mass in her pelvis at the level of the anastomosis as well as peritoneal implants. She denies weight loss or significant rectal bleeding.  Past Medical History:  Diagnosis Date  . Anemia   . Arthritis    knees  . Blood dyscrasia 2016   microcytic anemia, likely iron deficiency anemia secondary to colon cancer  . Cataracts, bilateral   . Colon cancer ([place]) 06/2015   tubular villous adenoma on sigmoid colon specimen (08/09/15), myltiple tubular adenomas removed on a colonoscopy  11/16/15  . Complication of anesthesia   . Fatty liver   . Headache(784.0)    migraines  . Hypothyroidism   . Liver metastasis ([place])   . Metastasis to kidney ([place])   . Osteoporosis   . PONV (postoperative nausea and vomiting)    NAUSEA  . Renal carcinoma ([place]) 06/2016   ? primary  . Secondary liver cancer ([place]) 06/2016   ? metastasis from colon   Past Surgical History:  Procedure Laterality Date  . ABDOMINAL HYSTERECTOMY  1993   with bilateral BSO  . abdominal tumor  1993  . APPENDECTOMY    . IR GENERIC HISTORICAL  07/01/2016   IR RADIOLOGIST EVAL & MGMT 07/01/2016 Greggory Keen, MD [facility] INTERV RAD  . IR GENERIC HISTORICAL  08/14/2016   IR RADIOLOGIST EVAL & MGMT 08/14/2016 Jacqulynn Cadet, MD [facility] INTERV RAD  . IR RADIOLOGIST EVAL & MGMT  12/24/2017  . IR RADIOLOGIST EVAL & MGMT  04/22/2018  . LAPAROSCOPIC PARTIAL COLECTOMY N/A 08/09/2015   Procedure: LAPAROSCOPIC PARTIAL COLECTOMY;  Surgeon: Jackolyn Confer, MD;  [facility]: [facility];  Service: General;  Laterality: N/A;  . PARTIAL KNEE ARTHROPLASTY  09/09/2012   Procedure: UNICOMPARTMENTAL KNEE;  Surgeon: Mauri Pole, MD;  Location: [facility];  Service: Orthopedics;  Laterality: Left;  . [place] LIVER TUMOR  2018  . XI ROBOTIC ASSISTED LOWER ANTERIOR RESECTION N/A 01/09/2017   Procedure: XI ROBOTIC ASSISTED LOWER ANTERIOR RESECTION;  Surgeon: Nichole Ruff, MD;  [facility]: [facility];  Service: General;  Laterality: N/A;   Social History   Socioeconomic History  . Marital status: Divorced    Spouse name: Not on file  . Number of children: Not on file  . Years of education: Not on file  . Highest education level: Not on file  Occupational History  .  Not on file  Social Needs  . Financial resource strain: Not on file  . Food insecurity:    Worry: Not on file    Inability: Not on file  . Transportation needs:    Medical: No    Non-medical: No  Tobacco Use  . Smoking status: Never Smoker  . Smokeless tobacco: Never Used  Substance and Sexual Activity  . Alcohol use: No  . Drug use: No  . Sexual activity: Not on file  Lifestyle  . Physical activity:    Days per week: Not on file    Minutes per session: Not on file  . Stress: Not on file   Relationships  . Social connections:    Talks on phone: Not on file    Gets together: Not on file    Attends religious service: Not on file    Active member of club or organization: Not on file    Attends meetings of clubs or organizations: Not on file    Relationship status: Not on file  . Intimate partner violence:    Fear of current or ex partner: Not on file    Emotionally abused: Not on file    Physically abused: Not on file    Forced sexual activity: Not on file  Other Topics Concern  . 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 Xeloda (500MG Tablet, Oral) Active. Imodium (2MG Capsule, Oral as needed) Active. MiraLax (Oral as needed) Active. Temazepam (15MG Capsule, Oral) Active. Levothyroxine Sodium (150MCG Tablet, Oral) Active. Medications Reconciled  Vitals Emeline Gins CMA; 02/14/2019 2:16 PM) 02/14/2019 2:15 PM Weight: 145.2 lb Height: 69in Body Surface Area: 1.8 m Body Mass Index: 21.44 kg/m  Temp.: 63F  Pulse: 120 (Regular)  BP: 102/70 (Sitting, Left Arm, Standard)    Physical Exam Nichole Ruff MD; 1/61/0960 2:32 PM) General Mental Status-Alert. General Appearance-Cooperative.  CV: RRR  Lungs: CTAB  Abdomen Palpation/Percussion Palpation and Percussion of the abdomen reveal - Soft and Non Tender.  Rectal Anorectal Exam Internal - Note: Mass noted approximate 4 cm from anal verge with narrowing to the tip of my index finger.    Assessment & Plan Nichole Ruff MD;  4/54/0981 2:31 PM) LOCAL RECURRENCE OF COLON CANCER (C18.9) Impression: 78 year old female who presents to the office with a recurrent colon cancer. This is her second pelvic reoperation. She also has peritoneal implants on CT scan. Given her history and findings on CT scan I do not think that she would be well served with an attempted abdominal peroneal resection. I think she would incur quite a bit of risk for virtually no benefit. We discussed a diverting ostomy as a potential palliative measure. We discussed that she may develop significant bleeding and pain in the future as this tumor continues to grow. She would like to discuss this with her oncologist later this week and she will call us when they have made a decision. We did give them an introductory ostomy kit to go over at home. All questions were answered in detail. The  surgery and anatomy were described to the patient as well as the risks of surgery and the possible complications.  These include: Bleeding, deep abdominal infections and possible wound complications such as hernia and infection, damage to adjacent structures, leak of surgical connections, which can lead to other surgeries and possibly an ostomy, possible need for other procedures, such as abscess drains in radiology, possible prolonged hospital stay, possible diarrhea from removal of part of the colon, possible constipation from narcotics, prolonged fatigue/weakness or appetite loss, possible early recurrence of of disease, possible complications of their medical problems such as heart disease or arrhythmias or lung problems, death (less than 1%). I believe the patient understands and wishes to proceed with the surgery. We will also place a port for chemotherapy.  Risks such as bleeding, infection, device malfunction, need for additional surgery and pneumothorax discussed with patient and she has agreed to proceed with port placement.

## 2019-02-22 NOTE — Progress Notes (Signed)
02/15/2019- noted in [place]- labs at [facility]- CEA, [place]  01/11/2019- noted in Epic-CT abd. Pelvis w/contrast

## 2019-02-22 NOTE — Patient Instructions (Addendum)
Riven P Deam   Your procedure is scheduled on: Wednesday 03/02/2019   Report to [facility] Main  Entrance              Report to at Short stay   0630 AM    Call this number if you have problems the morning of surgery [phone]              Follow Bowel prep instructions from Dr. Marcello Moores office the day before surgery on Tuesday 03/01/2019 and drink clear liquids all day!   Remember: Do not eat food  :After Midnight.    DRINK 2 PRESURGERY ENSURE DRINKS THE NIGHT BEFORE SURGERY AT  1000 PM AND 1 PRESURGERY DRINK THE DAY OF THE PROCEDURE 3 HOURS PRIOR TO SCHEDULED SURGERY. NO SOLIDS AFTER MIDNIGHT THE DAY PRIOR TO THE SURGERY. NOTHING BY MOUTH EXCEPT CLEAR LIQUIDS UNTIL THREE HOURS PRIOR TO SCHEDULED SURGERY.  PLEASE FINISH PRESURGERY ENSURE DRINK PER SURGEON ORDER 3 HOURS PRIOR TO SCHEDULED SURGERY TIME WHICH NEEDS TO BE COMPLETED AT  0530 am.    CLEAR LIQUID DIET   Foods Allowed                                                                     Foods Excluded  Coffee and tea, regular and decaf                             liquids that you cannot  Plain Jell-O in any flavor                                             see through such as: Fruit ices (not with fruit pulp)                                     milk, soups, orange juice  Iced Popsicles                                    All solid food Carbonated beverages, regular and diet                                    Cranberry, grape and apple juices Sports drinks like Gatorade Lightly seasoned clear broth or consume(fat free) Sugar, honey syrup  Sample Menu Breakfast                                Lunch                                     Supper Cranberry juice  Beef broth                            Chicken broth Jell-O                                     Grape juice                           Apple juice Coffee or tea                        Jell-O                                       Popsicle                                                Coffee or tea                        Coffee or tea  _____________________________________________________________________               BRUSH YOUR TEETH MORNING OF SURGERY AND RINSE YOUR MOUTH OUT, NO CHEWING GUM CANDY OR MINTS.     Take these medicines the morning of surgery with A SIP OF WATER: Levothyroxine (Synthroid)                                     You may not have any metal on your body including hair pins and              piercings  Do not wear jewelry, make-up, lotions, powders or perfumes, deodorant             Do not wear nail polish.  Do not shave  48 hours prior to surgery.               Do not bring valuables to the hospital. [place].  Contacts, dentures or bridgework may not be worn into surgery.             Bring only the essentials.                 Please read over the following fact sheets you were given: _____________________________________________________________________             [facility] - Preparing for Surgery Before surgery, you can play an important role.  Because skin is not sterile, your skin needs to be as free of germs as possible.  You can reduce the number of germs on your skin by washing with CHG (chlorahexidine gluconate) soap before surgery.  CHG is an antiseptic cleaner which kills germs and bonds with the skin to continue killing germs even after washing. Please DO NOT use if you have an allergy to CHG or antibacterial soaps.  If your skin becomes reddened/irritated stop using the CHG and inform your nurse  when you arrive at Short Stay. Do not shave (including legs and underarms) for at least 48 hours prior to the first CHG shower.  You may shave your face/neck. Please follow these instructions carefully:  1.  Shower with CHG Soap the night before surgery and the  morning of Surgery.  2.  If you choose to wash your hair,  wash your hair first as usual with your  normal  shampoo.  3.  After you shampoo, rinse your hair and body thoroughly to remove the  shampoo.               4.  Use CHG as you would any other liquid soap.  You can apply chg directly  to the skin and wash                       Gently with a scrungie or clean washcloth.  5.  Apply the CHG Soap to your body ONLY FROM THE NECK DOWN.   Do not use on face/ open                           Wound or open sores. Avoid contact with eyes, ears mouth and genitals (private parts).                       Wash face,  Genitals (private parts) with your normal soap.             6.  Wash thoroughly, paying special attention to the area where your surgery  will be performed.  7.  Thoroughly rinse your body with warm water from the neck down.  8.  DO NOT shower/wash with your normal soap after using and rinsing off  the CHG Soap.             9.  Pat yourself dry with a clean towel.            10.  Wear clean pajamas.            11.  Place clean sheets on your bed the night of your first shower and do not  sleep with pets. Day of Surgery : Do not apply any lotions/deodorants the morning of surgery.  Please wear clean clothes to the hospital/surgery center.  FAILURE TO FOLLOW THESE INSTRUCTIONS MAY RESULT IN THE CANCELLATION OF YOUR SURGERY PATIENT SIGNATURE_________________________________  NURSE SIGNATURE__________________________________  ________________________________________________________________________   Adam Phenix  An incentive spirometer is a tool that can help keep your lungs clear and active. This tool measures how well you are filling your lungs with each breath. Taking long deep breaths may help reverse or decrease the chance of developing breathing (pulmonary) problems (especially infection) following:  A long period of time when you are unable to move or be active. BEFORE THE PROCEDURE   If the spirometer includes an indicator to  show your best effort, your nurse or respiratory therapist will set it to a desired goal.  If possible, sit up straight or lean slightly forward. Try not to slouch.  Hold the incentive spirometer in an upright position. INSTRUCTIONS FOR USE  1. Sit on the edge of your bed if possible, or sit up as far as you can in bed or on a chair. 2. Hold the incentive spirometer in an upright position. 3. Breathe out normally. 4. Place the mouthpiece in your mouth and seal your lips tightly around  it. 5. Breathe in slowly and as deeply as possible, raising the piston or the ball toward the top of the column. 6. Hold your breath for 3-5 seconds or for as long as possible. Allow the piston or ball to fall to the bottom of the column. 7. Remove the mouthpiece from your mouth and breathe out normally. 8. Rest for a few seconds and repeat Steps 1 through 7 at least 10 times every 1-2 hours when you are awake. Take your time and take a few normal breaths between deep breaths. 9. The spirometer may include an indicator to show your best effort. Use the indicator as a goal to work toward during each repetition. 10. After each set of 10 deep breaths, practice coughing to be sure your lungs are clear. If you have an incision (the cut made at the time of surgery), support your incision when coughing by placing a pillow or rolled up towels firmly against it. Once you are able to get out of bed, walk around indoors and cough well. You may stop using the incentive spirometer when instructed by your caregiver.  RISKS AND COMPLICATIONS  Take your time so you do not get dizzy or light-headed.  If you are in pain, you may need to take or ask for pain medication before doing incentive spirometry. It is harder to take a deep breath if you are having pain. AFTER USE  Rest and breathe slowly and easily.  It can be helpful to keep track of a log of your progress. Your caregiver can provide you with a simple table to help with  this. If you are using the spirometer at home, follow these instructions: [place] IF:   You are having difficultly using the spirometer.  You have trouble using the spirometer as often as instructed.  Your pain medication is not giving enough relief while using the spirometer.  You develop fever of 100.5 F (38.1 C) or higher. SEEK IMMEDIATE MEDICAL CARE IF:   You cough up bloody sputum that had not been present before.  You develop fever of 102 F (38.9 C) or greater.  You develop worsening pain at or near the incision site. MAKE SURE YOU:   Understand these instructions.  Will watch your condition.  Will get help right away if you are not doing well or get worse. Document Released: 03/16/2007 Document Revised: 01/26/2012 Document Reviewed: 05/17/2007 [facility] Patient Information 2014 [place], [place].   ________________________________________________________________________

## 2019-02-23 ENCOUNTER — Encounter (HOSPITAL_COMMUNITY): Payer: Self-pay

## 2019-02-23 ENCOUNTER — Encounter (HOSPITAL_COMMUNITY)
Admission: RE | Admit: 2019-02-23 | Discharge: 2019-02-23 | Disposition: A | Discharge status: Home / Self Care | Payer: Medicare Other | Source: Ambulatory Visit | Attending: General Surgery | Admitting: General Surgery

## 2019-02-23 ENCOUNTER — Other Ambulatory Visit: Payer: Self-pay

## 2019-02-23 DIAGNOSIS — C189 Malignant neoplasm of colon, unspecified: Secondary | ICD-10-CM | POA: Diagnosis present

## 2019-02-23 DIAGNOSIS — Z01812 Encounter for preprocedural laboratory examination: Secondary | ICD-10-CM | POA: Diagnosis not present

## 2019-02-23 LAB — CBC
HCT: 34.1 % — ABNORMAL LOW (ref 36.0–46.0)
Hemoglobin: 10.6 g/dL — ABNORMAL LOW (ref 12.0–15.0)
MCH: 26.1 pg (ref 26.0–34.0)
MCHC: 31.1 g/dL (ref 30.0–36.0)
MCV: 84 fL (ref 80.0–100.0)
Platelets: 245 10*3/uL (ref 150–400)
RBC: 4.06 MIL/uL (ref 3.87–5.11)
RDW: 15.5 % (ref 11.5–15.5)
WBC: 2.9 10*3/uL — ABNORMAL LOW (ref 4.0–10.5)
nRBC: 0 % (ref 0.0–0.2)

## 2019-02-23 LAB — BASIC METABOLIC PANEL
Anion gap: 8 (ref 5–15)
BUN: 15 mg/dL (ref 8–23)
CO2: 23 mmol/L (ref 22–32)
Calcium: 8.9 mg/dL (ref 8.9–10.3)
Chloride: 103 mmol/L (ref 98–111)
Creatinine, Ser: 1.07 mg/dL — ABNORMAL HIGH (ref 0.44–1.00)
GFR calc Af Amer: 59 mL/min — ABNORMAL LOW (ref >60)
GFR calc non Af Amer: 51 mL/min — ABNORMAL LOW (ref >60)
Glucose, Bld: 127 mg/dL — ABNORMAL HIGH (ref 70–99)
Potassium: 4.6 mmol/L (ref 3.5–5.1)
Sodium: 134 mmol/L — ABNORMAL LOW (ref 135–145)

## 2019-02-23 NOTE — Progress Notes (Signed)
Called Nichole Cross in Triage at [facility] inquiring if patient needed a Bowel Prep for her surgery on 03/02/2019 , Lap Colostomy Creation and port placement. Nichole Cross was going to find out and Fax the instructions over to me.  Lelon Huh, RN in Triage at [facility] about bowel prep instructions and she informed me that Dr. Marcello Moores did not have a bowel prep needed for patient. I informed the patient to call [facility] and talk to the Triage nurse herself for clarification of needing a bowel prep.Patient verbalized understanding and will call Dr. Marcello Moores office.

## 2019-02-23 NOTE — Consult Note (Addendum)
[place] Nurse ostomy consult note  [place] Nurse requested for preoperative stoma site marking by Dr. Marcello Moores   Discussed surgical procedure and stoma creation with patient.  Explained role of the [place] nurse team.  Answered patient questions.   Examined patient sitting and standing in order to place the marking in the patient's visual field, away from any creases or abdominal contour issues and within the rectus muscle.     Marked for colostomy in the LLQ  5.5 cm to the left of the umbilicus and 4.7ML below the umbilicus.  Marked for ileostomy in the RUQ  3.5cm to the right of the umbilicus and 6.5 cm above the umbilicus.  Patient's abdomen cleansed with CHG wipes at site markings, allowed to air dry prior to marking.Covered mark with thin film transparent dressing to preserve mark until date of surgery.   [place] nursing team will follow and will remain available to this patient, the nursing and medical teams.  Please reconsult postoperatively if a stoma is created.  Thanks, Maudie Flakes, MSN, RN, [place], Arther Abbott  Pager# [phone]

## 2019-02-24 NOTE — Progress Notes (Signed)
IOE:VOJJK burns  CARDIOLOGIST:  INFO IN Epic:cbc 02-23-19 results abnormal wbc  INFO ON CHART:  BLOOD THINNERS AND LAST DOSES:none ____________________________________  PATIENT SYMPTOMS AT TIME OF PREOP:

## 2019-02-28 NOTE — Anesthesia Preprocedure Evaluation (Addendum)
Anesthesia Evaluation  Patient identified by MRN, date of birth, ID band Patient awake    Reviewed: Allergy & Precautions, NPO status , Patient's Chart, lab work & pertinent test results  History of Anesthesia Complications (+) PONV and history of anesthetic complications  Airway Mallampati: II  TM Distance: >3 FB Neck ROM: Full    Dental  (+) Edentulous Upper, Edentulous Lower, Dental Advisory Given   Pulmonary neg pulmonary ROS,    Pulmonary exam normal breath sounds clear to auscultation       Cardiovascular negative cardio ROS Normal cardiovascular exam Rhythm:Regular Rate:Normal     Neuro/Psych  Headaches, negative psych ROS   GI/Hepatic negative GI ROS, Neg liver ROS,   Endo/Other  Hypothyroidism   Renal/GU Renal disease     Musculoskeletal  (+) Arthritis ,   Abdominal   Peds  Hematology  (+) Blood dyscrasia, anemia ,   Anesthesia Other Findings   Reproductive/Obstetrics negative OB ROS                                                              Anesthesia Evaluation  Patient identified by MRN, date of birth, ID band Patient awake    Reviewed: Allergy & Precautions, NPO status , Patient's Chart, lab work & pertinent test results  Airway Mallampati: II  TM Distance: >3 FB Neck ROM: Full    Dental no notable dental hx.    Pulmonary neg pulmonary ROS,    Pulmonary exam normal breath sounds clear to auscultation       Cardiovascular negative cardio ROS Normal cardiovascular exam Rhythm:Regular Rate:Normal     Neuro/Psych negative neurological ROS  negative psych ROS   GI/Hepatic negative GI ROS, Neg liver ROS,   Endo/Other  negative endocrine ROS  Renal/GU negative Renal ROS  negative genitourinary   Musculoskeletal negative musculoskeletal ROS (+)   Abdominal   Peds negative pediatric ROS (+)  Hematology negative hematology ROS (+)    Anesthesia Other Findings   Reproductive/Obstetrics negative OB ROS                             Anesthesia Physical Anesthesia Plan  ASA: II  Anesthesia Plan: General   Post-op Pain Management:    Induction: Intravenous  Airway Management Planned: Oral ETT  Additional Equipment:   Intra-op Plan:   Post-operative Plan: Extubation in OR  Informed Consent: I have reviewed the patients History and Physical, chart, labs and discussed the procedure including the risks, benefits and alternatives for the proposed anesthesia with the patient or authorized representative who has indicated his/her understanding and acceptance.   Dental advisory given  Plan Discussed with: CRNA and Surgeon  Anesthesia Plan Comments:         Anesthesia Quick Evaluation  Anesthesia Physical Anesthesia Plan  ASA: III  Anesthesia Plan: General   Post-op Pain Management:    Induction: Intravenous and Rapid sequence  PONV Risk Score and Plan: 4 or greater and Ondansetron, Dexamethasone and Treatment may vary due to age or medical condition  Airway Management Planned: Oral ETT  Additional Equipment:   Intra-op Plan:   Post-operative Plan: Extubation in OR  Informed Consent: I have reviewed the patients History and Physical, chart, labs and discussed the  procedure including the risks, benefits and alternatives for the proposed anesthesia with the patient or authorized representative who has indicated his/her understanding and acceptance.     Dental advisory given  Plan Discussed with: CRNA  Anesthesia Plan Comments: (See PAT note 02/23/19, Konrad Felix, PA-C )       Anesthesia Quick Evaluation

## 2019-02-28 NOTE — Progress Notes (Signed)
Anesthesia Chart Review   Case:  [id number] Date/Time:  03/02/19 0815   Procedures:      LAPAROSCOPIC COLOSTOMY CREATION (N/A )     INSERTION PORT-A-CATH WITH U/S GUIDANCE (N/A )   Anesthesia type:  General   Pre-op diagnosis:  STAGE 4 COLON CANCER   Location:  [place] [facility] / [facility]   Surgeon:  Leighton Ruff, MD      DISCUSSION: 78 yo never smoker with h/o PONV, migraines, hypothyroidism, colon cancer with metastasis to kidney, liver (s/p sigmoid colectomy 07/2015, low anterior resection 12/2016, radiation/chemo completed 08/02/18) scheduled for above procedure 02/28/23 with Dr. Leighton Ruff.   Pt can proceed with planned procedure barring acute status change.  VS: BP 108/66   Pulse 84   Temp 36.5 C (Oral)   Resp 18   Ht 5\' 7"  (1.702 m)   Wt 65.3 kg   SpO2 100%   BMI 22.56 kg/m   PROVIDERS: [name], MD is PCP   [name], MD is Oncologist  LABS: Labs reviewed: Acceptable for surgery. (all labs ordered are listed, but only abnormal results are displayed)  Labs Reviewed  BASIC METABOLIC PANEL - Abnormal; Notable for the following components:      Result Value   Sodium 134 (*)    Glucose, Bld 127 (*)    Creatinine, Ser 1.07 (*)    GFR calc non Af Amer 51 (*)    GFR calc Af Amer 59 (*)    All other components within normal limits  CBC - Abnormal; Notable for the following components:   WBC 2.9 (*)    Hemoglobin 10.6 (*)    HCT 34.1 (*)    All other components within normal limits     IMAGES: CT Abdomen Pelvis 01/11/19 IMPRESSION: Status post left hemicolectomy. Suspected residual/recurrent tumor along the suture line with possible posterior tension into the presacral region and anterior extension involving the left vaginal fornix.  2.7 cm peritoneal implant along the left pericolic gutter, mildly progressive.  No evidence of metastatic disease in the chest. 3 mm subpleural nodule in the anterior/inferior right upper lobe is improved,  likely reflecting post infectious/inflammatory scarring.  Stable 2.6 cm enhancing lesion in the right upper kidney, suspicious for solid renal neoplasm such as renal cell carcinoma.  Additional stable ancillary findings as above.  EKG: 07/25/2016 Rate 61 bpm Normal sinus rhythm Normal ECG  CV:  Past Medical History:  Diagnosis Date  . Anemia   . Arthritis    knees  . Blood dyscrasia 2016   microcytic anemia, likely iron deficiency anemia secondary to colon cancer  . Cataracts, bilateral   . Colon cancer ([place]) 06/2015   tubular villous adenoma on sigmoid colon specimen (08/09/15), myltiple tubular adenomas removed on a colonoscopy  11/16/15  . Complication of anesthesia   . Fatty liver   . Headache(784.0)    migraines  . Hypothyroidism   . Liver metastasis ([place])   . Metastasis to kidney ([place])   . Osteoporosis   . PONV (postoperative nausea and vomiting)    NAUSEA  . Renal carcinoma ([place]) 06/2016   ? primary  . Secondary liver cancer ([place]) 06/2016   ? metastasis from colon    Past Surgical History:  Procedure Laterality Date  . ABDOMINAL HYSTERECTOMY  1993   with bilateral BSO  . abdominal tumor  1993  . APPENDECTOMY    . COLON SURGERY     partial colectomy  . IR GENERIC HISTORICAL  07/01/2016   IR RADIOLOGIST EVAL & MGMT 07/01/2016 Greggory Keen, MD [facility] INTERV RAD  . IR GENERIC HISTORICAL  08/14/2016   IR RADIOLOGIST EVAL & MGMT 08/14/2016 Jacqulynn Cadet, MD [facility] INTERV RAD  . IR RADIOLOGIST EVAL & MGMT  12/24/2017  . IR RADIOLOGIST EVAL & MGMT  04/22/2018  . LAPAROSCOPIC PARTIAL COLECTOMY N/A 08/09/2015   Procedure: LAPAROSCOPIC PARTIAL COLECTOMY;  Surgeon: Jackolyn Confer, MD;  Location: [facility];  Service: General;  Laterality: N/A;  . PARTIAL KNEE ARTHROPLASTY  09/09/2012   Procedure: UNICOMPARTMENTAL KNEE;  Surgeon: Mauri Pole, MD;  Location: [facility];  Service: Orthopedics;  Laterality: Left;  . [place] LIVER TUMOR  2018  . XI ROBOTIC  ASSISTED LOWER ANTERIOR RESECTION N/A 01/09/2017   Procedure: XI ROBOTIC ASSISTED LOWER ANTERIOR RESECTION;  Surgeon: Leighton Ruff, MD;  Location: [facility];  Service: General;  Laterality: N/A;    MEDICATIONS: . Ensure (ENSURE)  . ferrous sulfate 325 (65 FE) MG EC tablet  . HYDROcodone-acetaminophen (NORCO/VICODIN) 5-325 MG tablet  . levothyroxine (SYNTHROID, LEVOTHROID) 150 MCG tablet  . loperamide (IMODIUM) 2 MG capsule  . polyethylene glycol (MIRALAX / GLYCOLAX) packet  . temazepam (RESTORIL) 15 MG capsule  . traMADol (ULTRAM) 50 MG tablet   No current facility-administered medications for this encounter.     Maia Plan [facility] Pre-Surgical Testing [phone] 02/28/19 1:12 PM

## 2019-03-01 MED ORDER — BUPIVACAINE LIPOSOME 1.3 % IJ SUSP
20.0000 mL | Freq: Once | INTRAMUSCULAR | Status: DC
  Filled 2019-03-01: qty 20

## 2019-03-02 ENCOUNTER — Inpatient Hospital Stay (HOSPITAL_COMMUNITY): Payer: Medicare Other

## 2019-03-02 ENCOUNTER — Inpatient Hospital Stay (HOSPITAL_COMMUNITY): Payer: Medicare Other | Admitting: Registered Nurse

## 2019-03-02 ENCOUNTER — Inpatient Hospital Stay (HOSPITAL_COMMUNITY): Payer: Medicare Other | Admitting: Physician Assistant

## 2019-03-02 ENCOUNTER — Inpatient Hospital Stay (HOSPITAL_COMMUNITY)
Admission: RE | Admit: 2019-03-02 | Discharge: 2019-03-10 | DRG: 331 | Disposition: A | Discharge status: Home Health | Payer: Medicare Other | Attending: General Surgery | Admitting: General Surgery

## 2019-03-02 ENCOUNTER — Encounter (HOSPITAL_COMMUNITY): Payer: Self-pay | Admitting: Registered Nurse

## 2019-03-02 ENCOUNTER — Encounter (HOSPITAL_COMMUNITY)
Admission: RE | Disposition: A | Discharge status: Home Health | Payer: Self-pay | Source: Home / Self Care | Attending: General Surgery

## 2019-03-02 ENCOUNTER — Other Ambulatory Visit: Payer: Self-pay

## 2019-03-02 DIAGNOSIS — Z90710 Acquired absence of both cervix and uterus: Secondary | ICD-10-CM

## 2019-03-02 DIAGNOSIS — Z85038 Personal history of other malignant neoplasm of large intestine: Secondary | ICD-10-CM | POA: Diagnosis not present

## 2019-03-02 DIAGNOSIS — Z419 Encounter for procedure for purposes other than remedying health state, unspecified: Secondary | ICD-10-CM

## 2019-03-02 DIAGNOSIS — E039 Hypothyroidism, unspecified: Secondary | ICD-10-CM | POA: Diagnosis not present

## 2019-03-02 DIAGNOSIS — Z8505 Personal history of malignant neoplasm of liver: Secondary | ICD-10-CM | POA: Diagnosis not present

## 2019-03-02 DIAGNOSIS — K660 Peritoneal adhesions (postprocedural) (postinfection): Secondary | ICD-10-CM | POA: Diagnosis present

## 2019-03-02 DIAGNOSIS — Z96652 Presence of left artificial knee joint: Secondary | ICD-10-CM | POA: Diagnosis not present

## 2019-03-02 DIAGNOSIS — Z79899 Other long term (current) drug therapy: Secondary | ICD-10-CM | POA: Diagnosis not present

## 2019-03-02 DIAGNOSIS — M810 Age-related osteoporosis without current pathological fracture: Secondary | ICD-10-CM | POA: Diagnosis present

## 2019-03-02 DIAGNOSIS — Z9221 Personal history of antineoplastic chemotherapy: Secondary | ICD-10-CM

## 2019-03-02 DIAGNOSIS — K760 Fatty (change of) liver, not elsewhere classified: Secondary | ICD-10-CM | POA: Diagnosis not present

## 2019-03-02 DIAGNOSIS — Z79890 Hormone replacement therapy (postmenopausal): Secondary | ICD-10-CM | POA: Diagnosis not present

## 2019-03-02 DIAGNOSIS — C189 Malignant neoplasm of colon, unspecified: Principal | ICD-10-CM | POA: Diagnosis present

## 2019-03-02 DIAGNOSIS — I10 Essential (primary) hypertension: Secondary | ICD-10-CM | POA: Diagnosis not present

## 2019-03-02 DIAGNOSIS — R110 Nausea: Secondary | ICD-10-CM

## 2019-03-02 DIAGNOSIS — Z95828 Presence of other vascular implants and grafts: Secondary | ICD-10-CM

## 2019-03-02 DIAGNOSIS — Z85528 Personal history of other malignant neoplasm of kidney: Secondary | ICD-10-CM

## 2019-03-02 DIAGNOSIS — Z4802 Encounter for removal of sutures: Secondary | ICD-10-CM | POA: Diagnosis not present

## 2019-03-02 DIAGNOSIS — Z433 Encounter for attention to colostomy: Secondary | ICD-10-CM | POA: Diagnosis not present

## 2019-03-02 DIAGNOSIS — R109 Unspecified abdominal pain: Secondary | ICD-10-CM | POA: Diagnosis not present

## 2019-03-02 DIAGNOSIS — Z452 Encounter for adjustment and management of vascular access device: Secondary | ICD-10-CM | POA: Diagnosis not present

## 2019-03-02 HISTORY — PX: COLON RESECTION: SHX5231

## 2019-03-02 HISTORY — PX: PORTACATH PLACEMENT: SHX2246

## 2019-03-02 SURGERY — INSERTION, TUNNELED CENTRAL VENOUS DEVICE, WITH PORT
Anesthesia: General | Site: Chest | Laterality: Right

## 2019-03-02 MED ORDER — ALBUMIN HUMAN 5 % IV SOLN
INTRAVENOUS | Status: AC
  Filled 2019-03-02: qty 250

## 2019-03-02 MED ORDER — ACETAMINOPHEN 500 MG PO TABS
1000.0000 mg | ORAL_TABLET | Freq: Four times a day (QID) | ORAL | Status: AC
  Administered 2019-03-02 – 2019-03-09 (×24): 1000 mg via ORAL
  Filled 2019-03-02 (×26): qty 2

## 2019-03-02 MED ORDER — LACTATED RINGERS IR SOLN
Status: DC | PRN
  Administered 2019-03-02: 1000 mL

## 2019-03-02 MED ORDER — MIDAZOLAM HCL 5 MG/5ML IJ SOLN
INTRAMUSCULAR | Status: DC | PRN
  Administered 2019-03-02 (×2): 1 mg via INTRAVENOUS

## 2019-03-02 MED ORDER — ALVIMOPAN 12 MG PO CAPS
12.0000 mg | ORAL_CAPSULE | ORAL | Status: AC
  Administered 2019-03-02: 12 mg via ORAL
  Filled 2019-03-02: qty 1

## 2019-03-02 MED ORDER — DIPHENHYDRAMINE HCL 50 MG/ML IJ SOLN: 12.5000 mg | Freq: Four times a day (QID) | INTRAMUSCULAR | Status: DC | PRN

## 2019-03-02 MED ORDER — ENOXAPARIN SODIUM 40 MG/0.4ML ~~LOC~~ SOLN
40.0000 mg | SUBCUTANEOUS | Status: DC
  Administered 2019-03-03 – 2019-03-10 (×8): 40 mg via SUBCUTANEOUS
  Filled 2019-03-02 (×8): qty 0.4

## 2019-03-02 MED ORDER — 0.9 % SODIUM CHLORIDE (POUR BTL) OPTIME
TOPICAL | Status: DC | PRN
  Administered 2019-03-02: 3000 mL

## 2019-03-02 MED ORDER — TEMAZEPAM 15 MG PO CAPS: 15.0000 mg | ORAL_CAPSULE | Freq: Every evening | ORAL | Status: DC | PRN

## 2019-03-02 MED ORDER — KETAMINE HCL 10 MG/ML IJ SOLN
INTRAMUSCULAR | Status: AC
  Filled 2019-03-02: qty 1

## 2019-03-02 MED ORDER — ALUM & MAG HYDROXIDE-SIMETH 200-200-20 MG/5ML PO SUSP: 30.0000 mL | Freq: Four times a day (QID) | ORAL | Status: DC | PRN

## 2019-03-02 MED ORDER — ACETAMINOPHEN 500 MG PO TABS
1000.0000 mg | ORAL_TABLET | ORAL | Status: AC
  Administered 2019-03-02: 1000 mg via ORAL
  Filled 2019-03-02: qty 2

## 2019-03-02 MED ORDER — HYDROMORPHONE HCL 1 MG/ML IJ SOLN
0.5000 mg | INTRAMUSCULAR | Status: DC | PRN
  Administered 2019-03-02 – 2019-03-10 (×15): 0.5 mg via INTRAVENOUS
  Filled 2019-03-02 (×15): qty 0.5

## 2019-03-02 MED ORDER — PROPOFOL 10 MG/ML IV BOLUS
INTRAVENOUS | Status: AC
  Filled 2019-03-02: qty 20

## 2019-03-02 MED ORDER — GABAPENTIN 300 MG PO CAPS
300.0000 mg | ORAL_CAPSULE | Freq: Two times a day (BID) | ORAL | Status: DC
  Administered 2019-03-02 – 2019-03-10 (×16): 300 mg via ORAL
  Filled 2019-03-02 (×16): qty 1

## 2019-03-02 MED ORDER — ONDANSETRON HCL 4 MG/2ML IJ SOLN
INTRAMUSCULAR | Status: AC
  Filled 2019-03-02: qty 2

## 2019-03-02 MED ORDER — PROPOFOL 10 MG/ML IV BOLUS
INTRAVENOUS | Status: DC | PRN
  Administered 2019-03-02: 30 mg via INTRAVENOUS
  Administered 2019-03-02: 110 mg via INTRAVENOUS
  Administered 2019-03-02: 30 mg via INTRAVENOUS

## 2019-03-02 MED ORDER — PHENYLEPHRINE HCL (PRESSORS) 10 MG/ML IV SOLN
INTRAVENOUS | Status: DC | PRN
  Administered 2019-03-02: 80 ug via INTRAVENOUS
  Administered 2019-03-02: 120 ug via INTRAVENOUS
  Administered 2019-03-02: 80 ug via INTRAVENOUS

## 2019-03-02 MED ORDER — PROMETHAZINE HCL 25 MG/ML IJ SOLN: 6.2500 mg | INTRAMUSCULAR | Status: DC | PRN

## 2019-03-02 MED ORDER — ALBUMIN HUMAN 5 % IV SOLN
INTRAVENOUS | Status: DC | PRN
  Administered 2019-03-02: 11:00:00 via INTRAVENOUS

## 2019-03-02 MED ORDER — DIPHENHYDRAMINE HCL 12.5 MG/5ML PO ELIX: 12.5000 mg | ORAL_SOLUTION | Freq: Four times a day (QID) | ORAL | Status: DC | PRN

## 2019-03-02 MED ORDER — MIDAZOLAM HCL 2 MG/2ML IJ SOLN
INTRAMUSCULAR | Status: AC
  Filled 2019-03-02: qty 2

## 2019-03-02 MED ORDER — SODIUM CHLORIDE 0.9 % IV SOLN
2.0000 g | Freq: Two times a day (BID) | INTRAVENOUS | Status: AC
  Administered 2019-03-02: 2 g via INTRAVENOUS
  Filled 2019-03-02: qty 2

## 2019-03-02 MED ORDER — ONDANSETRON HCL 4 MG PO TABS
4.0000 mg | ORAL_TABLET | Freq: Four times a day (QID) | ORAL | Status: DC | PRN
  Administered 2019-03-08: 4 mg via ORAL
  Filled 2019-03-02 (×2): qty 1

## 2019-03-02 MED ORDER — LIDOCAINE 2% (20 MG/ML) 5 ML SYRINGE
INTRAMUSCULAR | Status: DC | PRN
  Administered 2019-03-02: 1 mg/kg/h via INTRAVENOUS

## 2019-03-02 MED ORDER — SUGAMMADEX SODIUM 200 MG/2ML IV SOLN
INTRAVENOUS | Status: AC
  Filled 2019-03-02: qty 2

## 2019-03-02 MED ORDER — HYDROMORPHONE HCL 1 MG/ML IJ SOLN
INTRAMUSCULAR | Status: DC | PRN
  Administered 2019-03-02 (×4): 0.5 mg via INTRAVENOUS

## 2019-03-02 MED ORDER — SUGAMMADEX SODIUM 200 MG/2ML IV SOLN
INTRAVENOUS | Status: DC | PRN
  Administered 2019-03-02: 120 mg via INTRAVENOUS

## 2019-03-02 MED ORDER — SODIUM CHLORIDE (PF) 0.9 % IJ SOLN
INTRAMUSCULAR | Status: AC
  Filled 2019-03-02: qty 10

## 2019-03-02 MED ORDER — SODIUM CHLORIDE 0.9 % IV SOLN
2.0000 g | INTRAVENOUS | Status: AC
  Administered 2019-03-02: 2 g via INTRAVENOUS
  Filled 2019-03-02: qty 2

## 2019-03-02 MED ORDER — ROCURONIUM BROMIDE 10 MG/ML (PF) SYRINGE
PREFILLED_SYRINGE | INTRAVENOUS | Status: DC | PRN
  Administered 2019-03-02: 20 mg via INTRAVENOUS
  Administered 2019-03-02: 60 mg via INTRAVENOUS

## 2019-03-02 MED ORDER — DEXAMETHASONE SODIUM PHOSPHATE 10 MG/ML IJ SOLN
INTRAMUSCULAR | Status: AC
  Filled 2019-03-02: qty 1

## 2019-03-02 MED ORDER — BUPIVACAINE-EPINEPHRINE (PF) 0.25% -1:200000 IJ SOLN
INTRAMUSCULAR | Status: AC
  Filled 2019-03-02: qty 60

## 2019-03-02 MED ORDER — GABAPENTIN 300 MG PO CAPS
300.0000 mg | ORAL_CAPSULE | ORAL | Status: AC
  Administered 2019-03-02: 300 mg via ORAL
  Filled 2019-03-02: qty 1

## 2019-03-02 MED ORDER — TRAMADOL HCL 50 MG PO TABS
50.0000 mg | ORAL_TABLET | Freq: Four times a day (QID) | ORAL | Status: DC | PRN
  Administered 2019-03-02 – 2019-03-10 (×13): 50 mg via ORAL
  Filled 2019-03-02 (×13): qty 1

## 2019-03-02 MED ORDER — SUFENTANIL CITRATE 50 MCG/ML IV SOLN
INTRAVENOUS | Status: DC | PRN
  Administered 2019-03-02 (×2): 10 ug via INTRAVENOUS
  Administered 2019-03-02 (×3): 5 ug via INTRAVENOUS
  Administered 2019-03-02: 10 ug via INTRAVENOUS
  Administered 2019-03-02: 5 ug via INTRAVENOUS

## 2019-03-02 MED ORDER — KETAMINE HCL 10 MG/ML IJ SOLN
INTRAMUSCULAR | Status: DC | PRN
  Administered 2019-03-02: 20 mg via INTRAVENOUS
  Administered 2019-03-02: 10 mg via INTRAVENOUS
  Administered 2019-03-02: 30 mg via INTRAVENOUS

## 2019-03-02 MED ORDER — ONDANSETRON HCL 4 MG/2ML IJ SOLN
INTRAMUSCULAR | Status: DC | PRN
  Administered 2019-03-02: 4 mg via INTRAVENOUS

## 2019-03-02 MED ORDER — ROCURONIUM BROMIDE 10 MG/ML (PF) SYRINGE
PREFILLED_SYRINGE | INTRAVENOUS | Status: AC
  Filled 2019-03-02: qty 10

## 2019-03-02 MED ORDER — SUCCINYLCHOLINE CHLORIDE 200 MG/10ML IV SOSY
PREFILLED_SYRINGE | INTRAVENOUS | Status: AC
  Filled 2019-03-02: qty 10

## 2019-03-02 MED ORDER — LIDOCAINE 2% (20 MG/ML) 5 ML SYRINGE
INTRAMUSCULAR | Status: DC | PRN
  Administered 2019-03-02: 50 mg via INTRAVENOUS

## 2019-03-02 MED ORDER — HYDROMORPHONE HCL 1 MG/ML IJ SOLN
0.2500 mg | INTRAMUSCULAR | Status: DC | PRN
  Administered 2019-03-02 (×2): 0.5 mg via INTRAVENOUS

## 2019-03-02 MED ORDER — ALVIMOPAN 12 MG PO CAPS
12.0000 mg | ORAL_CAPSULE | Freq: Two times a day (BID) | ORAL | Status: DC
  Administered 2019-03-03 – 2019-03-04 (×3): 12 mg via ORAL
  Filled 2019-03-02 (×4): qty 1

## 2019-03-02 MED ORDER — EPHEDRINE 5 MG/ML INJ
INTRAVENOUS | Status: AC
  Filled 2019-03-02: qty 10

## 2019-03-02 MED ORDER — LEVOTHYROXINE SODIUM 75 MCG PO TABS
150.0000 ug | ORAL_TABLET | Freq: Every day | ORAL | Status: DC
  Administered 2019-03-03 – 2019-03-10 (×8): 150 ug via ORAL
  Filled 2019-03-02 (×9): qty 2

## 2019-03-02 MED ORDER — BUPIVACAINE-EPINEPHRINE 0.25% -1:200000 IJ SOLN
INTRAMUSCULAR | Status: DC | PRN
  Administered 2019-03-02: 30 mL
  Administered 2019-03-02: 10 mL

## 2019-03-02 MED ORDER — ENSURE SURGERY PO LIQD
237.0000 mL | Freq: Two times a day (BID) | ORAL | Status: DC
  Administered 2019-03-04 – 2019-03-10 (×11): 237 mL via ORAL
  Filled 2019-03-02 (×17): qty 237

## 2019-03-02 MED ORDER — HYDROMORPHONE HCL 2 MG/ML IJ SOLN
INTRAMUSCULAR | Status: AC
  Filled 2019-03-02: qty 1

## 2019-03-02 MED ORDER — MEPERIDINE HCL 50 MG/ML IJ SOLN: 6.2500 mg | INTRAMUSCULAR | Status: DC | PRN

## 2019-03-02 MED ORDER — PHENYLEPHRINE 40 MCG/ML (10ML) SYRINGE FOR IV PUSH (FOR BLOOD PRESSURE SUPPORT)
PREFILLED_SYRINGE | INTRAVENOUS | Status: AC
  Filled 2019-03-02: qty 10

## 2019-03-02 MED ORDER — ONDANSETRON HCL 4 MG/2ML IJ SOLN
4.0000 mg | Freq: Four times a day (QID) | INTRAMUSCULAR | Status: DC | PRN
  Filled 2019-03-02: qty 2

## 2019-03-02 MED ORDER — DEXAMETHASONE SODIUM PHOSPHATE 10 MG/ML IJ SOLN
INTRAMUSCULAR | Status: DC | PRN
  Administered 2019-03-02: 6 mg via INTRAVENOUS

## 2019-03-02 MED ORDER — SODIUM CHLORIDE 0.9 % IV SOLN
Freq: Once | INTRAVENOUS | Status: AC
  Administered 2019-03-02: 500 mL
  Filled 2019-03-02: qty 1.2

## 2019-03-02 MED ORDER — HYDROMORPHONE HCL 1 MG/ML IJ SOLN
INTRAMUSCULAR | Status: AC
  Filled 2019-03-02: qty 1

## 2019-03-02 MED ORDER — HEPARIN SOD (PORK) LOCK FLUSH 100 UNIT/ML IV SOLN
INTRAVENOUS | Status: AC
  Filled 2019-03-02: qty 5

## 2019-03-02 MED ORDER — KCL IN DEXTROSE-NACL 20-5-0.45 MEQ/L-%-% IV SOLN
INTRAVENOUS | Status: DC
  Administered 2019-03-02 – 2019-03-05 (×7): via INTRAVENOUS
  Filled 2019-03-02 (×7): qty 1000

## 2019-03-02 MED ORDER — SUFENTANIL CITRATE 50 MCG/ML IV SOLN
INTRAVENOUS | Status: AC
  Filled 2019-03-02: qty 1

## 2019-03-02 MED ORDER — BUPIVACAINE LIPOSOME 1.3 % IJ SUSP
INTRAMUSCULAR | Status: DC | PRN
  Administered 2019-03-02: 20 mL

## 2019-03-02 MED ORDER — LACTATED RINGERS IV SOLN
INTRAVENOUS | Status: DC
  Administered 2019-03-02 (×3): via INTRAVENOUS

## 2019-03-02 MED ORDER — EPHEDRINE SULFATE-NACL 50-0.9 MG/10ML-% IV SOSY
PREFILLED_SYRINGE | INTRAVENOUS | Status: DC | PRN
  Administered 2019-03-02: 10 mg via INTRAVENOUS

## 2019-03-02 MED ORDER — HEPARIN SOD (PORK) LOCK FLUSH 100 UNIT/ML IV SOLN
INTRAVENOUS | Status: DC | PRN
  Administered 2019-03-02: 500 [IU] via INTRAVENOUS

## 2019-03-02 MED ORDER — SACCHAROMYCES BOULARDII 250 MG PO CAPS
250.0000 mg | ORAL_CAPSULE | Freq: Two times a day (BID) | ORAL | Status: DC
  Administered 2019-03-02 – 2019-03-10 (×16): 250 mg via ORAL
  Filled 2019-03-02 (×16): qty 1

## 2019-03-02 SURGICAL SUPPLY — 99 items
APPLIER CLIP 5 13 M/L LIGAMAX5 (MISCELLANEOUS)
BAG DECANTER FOR FLEXI CONT (MISCELLANEOUS) ×4 IMPLANT
BARRIER SKIN 2 3/4 (OSTOMY) ×3 IMPLANT
BARRIER SKIN 2 3/4 INCH (OSTOMY) ×1
BLADE EXTENDED COATED 6.5IN (ELECTRODE) IMPLANT
BLADE SURG SZ11 CARB STEEL (BLADE) ×4 IMPLANT
CABLE HIGH FREQUENCY MONO STRZ (ELECTRODE) IMPLANT
CATH ROBINSON RED A/P 16FR (CATHETERS) ×4 IMPLANT
CELLS DAT CNTRL 66122 CELL SVR (MISCELLANEOUS) IMPLANT
CHLORAPREP W/TINT 26 (MISCELLANEOUS) ×4 IMPLANT
CLIP APPLIE 5 13 M/L LIGAMAX5 (MISCELLANEOUS) IMPLANT
COUNTER NEEDLE 20 DBL MAG RED (NEEDLE) ×4 IMPLANT
COVER PROBE U/S 5X48 (MISCELLANEOUS) ×4 IMPLANT
COVER WAND RF STERILE (DRAPES) ×4 IMPLANT
DECANTER SPIKE VIAL GLASS SM (MISCELLANEOUS) ×4 IMPLANT
DERMABOND ADVANCED (GAUZE/BANDAGES/DRESSINGS) ×2
DERMABOND ADVANCED .7 DNX12 (GAUZE/BANDAGES/DRESSINGS) ×2 IMPLANT
DRAIN CHANNEL 19F RND (DRAIN) ×4 IMPLANT
DRAPE C-ARM 42X120 X-RAY (DRAPES) ×4 IMPLANT
DRAPE LAPAROSCOPIC ABDOMINAL (DRAPES) ×8 IMPLANT
DRAPE LAPAROTOMY TRNSV 102X78 (DRAPE) IMPLANT
DRAPE SHEET LG 3/4 BI-LAMINATE (DRAPES) ×4 IMPLANT
DRAPE SURG IRRIG POUCH 19X23 (DRAPES) ×4 IMPLANT
DRSG OPSITE POSTOP 4X10 (GAUZE/BANDAGES/DRESSINGS) IMPLANT
DRSG OPSITE POSTOP 4X6 (GAUZE/BANDAGES/DRESSINGS) ×4 IMPLANT
DRSG OPSITE POSTOP 4X8 (GAUZE/BANDAGES/DRESSINGS) ×4 IMPLANT
DRSG TEGADERM 4X4.75 (GAUZE/BANDAGES/DRESSINGS) ×4 IMPLANT
ELECT PENCIL ROCKER SW 15FT (MISCELLANEOUS) ×8 IMPLANT
ELECT REM PT RETURN 15FT ADLT (MISCELLANEOUS) ×4 IMPLANT
EVACUATOR SILICONE 100CC (DRAIN) ×4 IMPLANT
GAUZE 4X4 16PLY RFD (DISPOSABLE) ×4 IMPLANT
GAUZE SPONGE 2X2 8PLY STRL LF (GAUZE/BANDAGES/DRESSINGS) ×2 IMPLANT
GAUZE SPONGE 4X4 12PLY STRL (GAUZE/BANDAGES/DRESSINGS) IMPLANT
GLOVE BIO SURGEON STRL SZ 6.5 (GLOVE) ×6 IMPLANT
GLOVE BIO SURGEONS STRL SZ 6.5 (GLOVE) ×2
GLOVE BIOGEL PI IND STRL 6.5 (GLOVE) ×4 IMPLANT
GLOVE BIOGEL PI IND STRL 7.0 (GLOVE) ×8 IMPLANT
GLOVE BIOGEL PI INDICATOR 6.5 (GLOVE) ×4
GLOVE BIOGEL PI INDICATOR 7.0 (GLOVE) ×8
GLOVE ECLIPSE 6.5 STRL STRAW (GLOVE) ×8 IMPLANT
GLOVE SURG SIGNA 7.5 PF LTX (GLOVE) ×4 IMPLANT
GLOVE SURG SS PI 6.5 STRL IVOR (GLOVE) ×12 IMPLANT
GOWN STRL REUS W/TWL 2XL LVL3 (GOWN DISPOSABLE) ×8 IMPLANT
GOWN STRL REUS W/TWL XL LVL3 (GOWN DISPOSABLE) ×16 IMPLANT
GRASPER ENDOPATH ANVIL 10MM (MISCELLANEOUS) IMPLANT
HOLDER FOLEY CATH W/STRAP (MISCELLANEOUS) ×4 IMPLANT
IRRIG SUCT STRYKERFLOW 2 WTIP (MISCELLANEOUS) ×4
IRRIGATION SUCT STRKRFLW 2 WTP (MISCELLANEOUS) ×2 IMPLANT
KIT BASIN OR (CUSTOM PROCEDURE TRAY) ×4 IMPLANT
KIT PORT POWER 8FR ISP CVUE (Port) ×4 IMPLANT
KIT TURNOVER KIT A (KITS) ×4 IMPLANT
LEGGING LITHOTOMY PAIR STRL (DRAPES) ×4 IMPLANT
NEEDLE HYPO 25X1 1.5 SAFETY (NEEDLE) ×4 IMPLANT
PACK COLON (CUSTOM PROCEDURE TRAY) ×4 IMPLANT
PACK GENERAL/GYN (CUSTOM PROCEDURE TRAY) ×4 IMPLANT
PAD POSITIONING PINK XL (MISCELLANEOUS) ×4 IMPLANT
PORT LAP GEL ALEXIS MED 5-9CM (MISCELLANEOUS) ×4 IMPLANT
POUCH OSTOMY 2 3/4  H 3804 (WOUND CARE) ×2
POUCH OSTOMY 2 PC DRNBL 2.75 (WOUND CARE) ×2 IMPLANT
PROTECTOR NERVE ULNAR (MISCELLANEOUS) ×8 IMPLANT
RTRCTR WOUND ALEXIS 18CM MED (MISCELLANEOUS)
SCISSORS LAP 5X35 DISP (ENDOMECHANICALS) ×4 IMPLANT
SEALER TISSUE G2 STRG ARTC 35C (ENDOMECHANICALS) ×4 IMPLANT
SET TUBE SMOKE EVAC HIGH FLOW (TUBING) ×4 IMPLANT
SLEEVE XCEL OPT CAN 5 100 (ENDOMECHANICALS) ×8 IMPLANT
SPONGE DRAIN TRACH 4X4 STRL 2S (GAUZE/BANDAGES/DRESSINGS) IMPLANT
SPONGE GAUZE 2X2 STER 10/PKG (GAUZE/BANDAGES/DRESSINGS) ×2
SPONGE LAP 18X18 RF (DISPOSABLE) IMPLANT
STAPLER GUN LINEAR PROX 60 (STAPLE) ×4 IMPLANT
STAPLER PROXIMATE 75MM BLUE (STAPLE) ×4 IMPLANT
STAPLER VISISTAT 35W (STAPLE) ×4 IMPLANT
SURGILUBE 2OZ TUBE FLIPTOP (MISCELLANEOUS) IMPLANT
SUT ETHILON 2 0 PS N (SUTURE) ×4 IMPLANT
SUT NOVA NAB DX-16 0-1 5-0 T12 (SUTURE) ×12 IMPLANT
SUT PROLENE 2 0 KS (SUTURE) IMPLANT
SUT PROLENE 2 0 SH DA (SUTURE) ×4 IMPLANT
SUT SILK 2 0 (SUTURE) ×2
SUT SILK 2 0 SH CR/8 (SUTURE) IMPLANT
SUT SILK 2-0 18XBRD TIE 12 (SUTURE) ×2 IMPLANT
SUT SILK 3 0 (SUTURE)
SUT SILK 3 0 SH CR/8 (SUTURE) ×16 IMPLANT
SUT SILK 3-0 18XBRD TIE 12 (SUTURE) IMPLANT
SUT VIC AB 2-0 SH 18 (SUTURE) ×20 IMPLANT
SUT VIC AB 3-0 SH 27 (SUTURE) ×2
SUT VIC AB 3-0 SH 27XBRD (SUTURE) ×2 IMPLANT
SUT VIC AB 4-0 PS2 18 (SUTURE) ×4 IMPLANT
SUT VIC AB 4-0 PS2 27 (SUTURE) ×8 IMPLANT
SUT VICRYL 0 UR6 27IN ABS (SUTURE) ×4 IMPLANT
SYR 10ML LL (SYRINGE) ×4 IMPLANT
SYR CONTROL 10ML LL (SYRINGE) ×4 IMPLANT
SYS LAPSCP GELPORT 120MM (MISCELLANEOUS)
SYSTEM LAPSCP GELPORT 120MM (MISCELLANEOUS) IMPLANT
TOWEL OR 17X26 10 PK STRL BLUE (TOWEL DISPOSABLE) ×4 IMPLANT
TOWEL OR NON WOVEN STRL DISP B (DISPOSABLE) ×4 IMPLANT
TRAY FOLEY MTR SLVR 14FR STAT (SET/KITS/TRAYS/PACK) ×4 IMPLANT
TROCAR BLADELESS OPT 5 100 (ENDOMECHANICALS) ×4 IMPLANT
TROCAR XCEL BLUNT TIP 100MML (ENDOMECHANICALS) IMPLANT
TUBING CONNECTING 10 (TUBING) ×6 IMPLANT
TUBING CONNECTING 10' (TUBING) ×2

## 2019-03-02 NOTE — Op Note (Signed)
03/02/2019  11:35 AM  PATIENT:  Nichole Cross  78 y.o. female  Patient Care Team: [name], MD as PCP - General (Internal Medicine) Ladell Pier, MD as Consulting Physician (Oncology)  PRE-OPERATIVE DIAGNOSIS:  STAGE 4 COLON CANCER  POST-OPERATIVE DIAGNOSIS:  STAGE 4 COLON CANCER  PROCEDURE:   INSERTION PORT-A-CATH WITH [place] LAPAROSCOPY COLOSTOMY CREATION, SMALL BOWEL RESECTION, BLADDER REPAIR    Surgeon(s): Leighton Ruff, MD Coralie Keens, MD  ASSISTANT: Dr Ninfa Linden   ANESTHESIA:   general  EBL: 165ml Total I/O In: 2000 [I.V.:2000] Out: 225 [Urine:75; Blood:150]  DRAINS: (39 F) Jackson-Pratt drain(s) with closed bulb suction in the abd   SPECIMEN:  Source of Specimen:  small bowel  DISPOSITION OF SPECIMEN:  PATHOLOGY  COUNTS:  NO: NEEDLE COUNT INCORRECT.  Abd XR was obtained prior to completion of the procedure  PLAN OF CARE: Admit to inpatient   PATIENT DISPOSITION:  PACU - hemodynamically stable.  INDICATION: 78 y.o. F with metastatic recurrent colon cancer.     OR FINDINGS: significant pelvis adhesions (frozen pelvis)  DESCRIPTION: the patient was identified in the preoperative holding area and taken to the OR where they were laid supine on the operating room table.  General anesthesia was induced without difficulty. SCDs were also noted to be in place prior to the initiation of anesthesia.  The patient was then prepped and draped in the usual sterile fashion.   A surgical timeout was performed indicating the correct patient, procedure, positioning and need for preoperative antibiotics.   I began by inserting a varies needle in the left upper quadrant without difficulty.  Insufflation was obtained.  A 5 mm port was placed in the right upper quadrant and the camera was introduced into the abdomen.  There was no sign of injury from Veress needle or 5 mm camera placement.  I then placed another port in the upper midline just below the falciform  ligament under direct visualization.  The camera was introduced through this port and midline adhesions were taken down bluntly.  I placed another port in the right lower quadrant.  A fourth port was placed in the left lower quadrant at the site of marked ostomy.  I spent approximately 30 minutes dissecting the omentum away from the colon and small bowel.  This was then reflected into the upper abdomen.  I identified a portion of colon which appears to be proximal transverse.  I was unable to identify the descending colon.  Did review the CT scan and the descending colon was along the left paracolic gutter.  There was a large amount of small bowel overlying this.  I attempted to lyse adhesions in the anterior pelvis on the right side to take down the small bowel.  They were densely adherent to the bladder anteriorly.  I then turned my attention to the left side and attempted to dissect these adhesions.  A small enterotomy was made during this attempt and I decided that we should forego laparoscopic lysis of adhesion at this time.  I made a lower midline incision and identified the portion of small bowel.  This was dissected free proximally and distally.  I attempted to free the small bowel from the pelvis but it was densely adherent to the anterior bladder.  I then tried to free up the proximal end of this using Metzenbaum scissors.  I was able to mobilize enough of the proximal end to come down to the pelvis.  I attempted once again to  mobilize the distal end but ended up making a small defect in the dome of the bladder.  This was closed using interrupted 2-0 Vicryl sutures and oversewn with 2-0 Vicryl interrupted sutures.  I then transected the portion of the small bowel that was nonviable and decided to perform a handsewn anastomosis due to the fact that we were unable to orient the bowel to a point where a GIA stapler could be introduced safely.  I completed a handsewn anastomosis with interrupted 3-0 silk  sutures.  It was airtight and watertight upon completion.  One other all serosal tear was closed using interrupted 3-0 silk sutures as well.  I palpated the descending colon.  It was studied with what felt like metastatic disease.  I decided that we would create a loop colostomy with the distal transverse colon.  I identified a portion of the transverse colon and freed the omentum from this.  A upper abdominal colostomy site was placed on the left side using electrocautery.  Dissection was carried down through the subcutaneous tissues and a cruciate incision was made on the fascia overlying the rectus.  The rectus was then split and the peritoneum was entered.  This was enlarged to 2 fingerbreadths and a loop of the distal transverse colon was brought out through this.  A red rubber catheter was placed underneath for a colostomy bar.  At this point the abdomen was irrigated with approximately 2 L of warm normal saline.  Hemostasis was good.  I placed a 7 [place] Blake drain overlying the bladder closure and brought it out through the right lower quadrant port site.  This was secured with a 2-0 nylon suture.  The omentum was then brought down to cover the small bowel anastomosis.  We then closed the lower midline incision using interrupted #1 Novafil sutures.  The skin was closed with staples.  A sterile dressing was applied.  The loop colostomy was then matured in standard [place]oke fashion with 3-0 Vicryl sutures.  A colostomy appliance was placed.  The patient was awakened from anesthesia and sent to the postanesthesia care unit in stable condition.  Needle counts were incorrect.  An abdominal x-ray was obtained which was negative for foreign body.  INDICATION: Patient with need for IV Chemotherapy. Port-A-Cath placement was requested.   Use of a central venous catheter for intravenous therapy was discussed. Technique of catheter placement using ultrasound and fluoroscopy guidance was discussed. Risks such as  bleeding, infection, pneumothorax, catheter occlusion, reoperation, and other risks were discussed. I noted a good likelihood this will help address the problem. Questions were answered. The patient expressed understanding & wishes to proceed.   Findings: Normal-appearing anatomy.   8 [place] power port. It goes through the right internal jugular vein   Procedure: Informed consent was confirmed. Patient was brought the operating room and positioned supine. Arms were tucked. The patient underwent deep sedation. Neck and chest were clipped and prepped and draped in a sterile fashion. A surgical timeout confirmed our plan. I placed a field block of local anesthesia on the chest.  I entered into the right internal jugular vein on the first venipuncture using US guidance. Non-pulsatile blood was returned. Wire was easily passed into the inferior vena cava and confirmed by fluoroscopy. I confirmed placement of the wire in the right side of the chest.  I made an incision in the lateral infraclavicular area and made a subcutaneous pocket. I used a dilator on the wire using Seldinger technique to dilate  the tract under fluoroscopy. I placed the catheter into the sheath. I then peeled away the dilator sheath. I tunneled the power port from the puncture site to the chest pocket. I cut the catheter to appropriate length and attached it to the port using the plastic connector. The port was placed into the pocket and secured to the left anterior chest wall using 2-0 Prolene interrupted stitches x2. Catheter flushed well.  Fluoroscopy confirmed the tip in the distal SVC. Catheter aspirated and flushed well. On final fluoro reevaluation the tip seen to be in good position in the distal SVC.  I closed the wounds using 3-0 Vicryl interrupted sutures for the pocket and 4-0 Monocryl stitch was used to close the skin. Dermabond was used on the 2 incisions. CXR will be performed in PACU. Catheter is okay to use.

## 2019-03-02 NOTE — Anesthesia Procedure Notes (Signed)
Procedure Name: Intubation Date/Time: 03/02/2019 8:44 AM Performed by: Lissa Morales, CRNA Pre-anesthesia Checklist: Patient identified, Emergency Drugs available, Suction available and Patient being monitored Patient Re-evaluated:Patient Re-evaluated prior to induction Oxygen Delivery Method: Circle system utilized Preoxygenation: Pre-oxygenation with 100% oxygen Induction Type: IV induction Ventilation: Mask ventilation without difficulty Laryngoscope Size: Mac and 4 Grade View: Grade I Tube type: Subglottic suction tube Tube size: 7.0 mm Number of attempts: 1 Airway Equipment and Method: Stylet and Oral airway Placement Confirmation: ETT inserted through vocal cords under direct vision,  positive ETCO2 and breath sounds checked- equal and bilateral Secured at: 21 cm Tube secured with: Tape Dental Injury: Teeth and Oropharynx as per pre-operative assessment

## 2019-03-02 NOTE — Interval H&P Note (Signed)
History and Physical Interval Note:  03/02/2019 8:27 AM  Nichole Cross  has presented today for surgery, with the diagnosis of STAGE 4 COLON CANCER.  The various methods of treatment have been discussed with the patient and family. After consideration of risks, benefits and other options for treatment, the patient has consented to  Procedure(s): INSERTION PORT-A-CATH WITH [place] (N/A) [place] (N/A) as a surgical intervention.  The patient's history has been reviewed, patient examined, no change in status, stable for surgery.  I have reviewed the patient's chart and labs.  Questions were answered to the patient's satisfaction.     [name], MD  [facility]

## 2019-03-02 NOTE — Transfer of Care (Signed)
Immediate Anesthesia Transfer of Care Note  Patient: Nichole Cross  Procedure(s) Performed: INSERTION PORT-A-CATH WITH [place] (Right Chest) LAPAROSCOPY COLOSTOMY CREATION, SMALL BOWEL RESECTION, BLADDER REPAIR (N/A Abdomen)  Patient Location: PACU  Anesthesia Type:General  Level of Consciousness: awake, alert , oriented and patient cooperative  Airway & Oxygen Therapy: Patient Spontanous Breathing and Patient connected to face mask oxygen  Post-op Assessment: Report given to RN, Post -op Vital signs reviewed and stable and Patient moving all extremities X 4  Post vital signs: stable  Last Vitals:  Vitals Value Taken Time  BP 114/72 03/02/2019 12:45 PM  Temp    Pulse 74 03/02/2019 12:50 PM  Resp 13 03/02/2019 12:50 PM  SpO2 100 % 03/02/2019 12:50 PM  Vitals shown include unvalidated device data.  Last Pain:  Vitals:   03/02/19 0659  TempSrc:   PainSc: 8       Patients Stated Pain Goal: 4 (51/46/04 7998)  Complications: No apparent anesthesia complications

## 2019-03-02 NOTE — Anesthesia Postprocedure Evaluation (Signed)
Anesthesia Post Note  Patient: Nichole Cross  Procedure(s) Performed: INSERTION PORT-A-CATH WITH [place] (Right Chest) LAPAROSCOPY COLOSTOMY CREATION, SMALL BOWEL RESECTION, BLADDER REPAIR (N/A Abdomen)     Patient location during evaluation: PACU Anesthesia Type: General Level of consciousness: sedated and patient cooperative Pain management: pain level controlled Vital Signs Assessment: post-procedure vital signs reviewed and stable Respiratory status: spontaneous breathing Cardiovascular status: stable Anesthetic complications: no    Last Vitals:  Vitals:   03/02/19 1415 03/02/19 1431  BP: 129/80 130/72  Pulse: 83 81  Resp: 12 16  Temp: (!) 36.3 C (!) 36.3 C  SpO2: 100% 100%    Last Pain:  Vitals:   03/02/19 1501  TempSrc:   PainSc: 10-Worst pain ever                 Nolon Nations

## 2019-03-03 ENCOUNTER — Encounter (HOSPITAL_COMMUNITY): Payer: Self-pay | Admitting: General Surgery

## 2019-03-03 LAB — CBC
HCT: 29.3 % — ABNORMAL LOW (ref 36.0–46.0)
Hemoglobin: 8.7 g/dL — ABNORMAL LOW (ref 12.0–15.0)
MCH: 25.2 pg — ABNORMAL LOW (ref 26.0–34.0)
MCHC: 29.7 g/dL — ABNORMAL LOW (ref 30.0–36.0)
MCV: 84.9 fL (ref 80.0–100.0)
Platelets: 230 10*3/uL (ref 150–400)
RBC: 3.45 MIL/uL — ABNORMAL LOW (ref 3.87–5.11)
RDW: 15.4 % (ref 11.5–15.5)
WBC: 9 10*3/uL (ref 4.0–10.5)
nRBC: 0 % (ref 0.0–0.2)

## 2019-03-03 LAB — BASIC METABOLIC PANEL
Anion gap: 8 (ref 5–15)
BUN: 9 mg/dL (ref 8–23)
CO2: 23 mmol/L (ref 22–32)
Calcium: 8 mg/dL — ABNORMAL LOW (ref 8.9–10.3)
Chloride: 102 mmol/L (ref 98–111)
Creatinine, Ser: 0.96 mg/dL (ref 0.44–1.00)
GFR calc Af Amer: >60 mL/min (ref >60)
GFR calc non Af Amer: 58 mL/min — ABNORMAL LOW (ref >60)
Glucose, Bld: 128 mg/dL — ABNORMAL HIGH (ref 70–99)
Potassium: 3.8 mmol/L (ref 3.5–5.1)
Sodium: 133 mmol/L — ABNORMAL LOW (ref 135–145)

## 2019-03-03 NOTE — Progress Notes (Signed)
1 Day Post-Op lap converted to open loop transverse colostomy creation, small bowel resection, bladder repair Subjective: Doing well, ambulated last night.  No nausea, pain controlled.  Objective: Vital signs in last 24 hours: Temp:  [97.3 F (36.3 C)-97.7 F (36.5 C)] 97.5 F (36.4 C) (04/16 0436) Pulse Rate:  [68-83] 74 (04/16 0436) Resp:  [8-22] 20 (04/16 0436) BP: (99-137)/(58-82) 109/70 (04/16 0436) SpO2:  [97 %-100 %] 100 % (04/16 0436)   Intake/Output from previous day: 04/15 0701 - 04/16 0700 In: 4932.3 [P.O.:360; I.V.:4222.3; IV Piggyback:350] Out: 1358 [Urine:800; Drains:333; Stool:25; Blood:200] Intake/Output this shift: No intake/output data recorded.   General appearance: alert and cooperative GI: normal findings: soft, non-tender  Incision: bloody drainage  Lab Results:  Recent Labs    03/03/19 0342  WBC 9.0  HGB 8.7*  HCT 29.3*  PLT 230   BMET Recent Labs    03/03/19 0342  NA 133*  K 3.8  CL 102  CO2 23  GLUCOSE 128*  BUN 9  CREATININE 0.96  CALCIUM 8.0*   PT/INR No results for input(s): LABPROT, INR in the last 72 hours. ABG No results for input(s): PHART, HCO3 in the last 72 hours.  Invalid input(s): PCO2, PO2  MEDS, Scheduled . acetaminophen  1,000 mg Oral Q6H  . alvimopan  12 mg Oral BID  . enoxaparin (LOVENOX) injection  40 mg Subcutaneous Q24H  . feeding supplement  237 mL Oral BID BM  . gabapentin  300 mg Oral BID  . levothyroxine  150 mcg Oral Q0600  . saccharomyces boulardii  250 mg Oral BID    Studies/Results: Dg Abd 1 View  Result Date: 03/02/2019 CLINICAL DATA:  Incorrect suture count. EXAM: ABDOMEN - 1 VIEW COMPARISON:  CT abdomen pelvis dated January 11, 2019. FINDINGS: Several intraoperative fluoroscopic images were submitted for review. Surgical staples are noted in the right lower quadrant and midline pelvis. Thin radiopaque linear density projecting over the L3 vertebral body is also favored to represent a  surgical staple en face. Surgical drain projects over the pelvis. New left lower quadrant ostomy. IMPRESSION: Surgical staples and drain noted. Thin radiopaque linear density projecting over the L3 vertebral body is favored to represent a surgical staple en face. Correlate with surgical procedure. These results were called by telephone at the time of interpretation on 03/02/2019 at 12:15 pm to [place], who verbally acknowledged these results. Electronically Signed   By: Titus Dubin M.D.   On: 03/02/2019 12:18   Dg Chest Port 1 View  Result Date: 03/02/2019 CLINICAL DATA:  78 year old female status post port catheter placement EXAM: PORTABLE CHEST 1 VIEW COMPARISON:  None. FINDINGS: Cardiomediastinal silhouette within normal limits. No pneumothorax or pleural effusion. No confluent airspace disease. No evidence of interlobular septal thickening/vascular congestion. Right IJ port catheter with the tip appearing to terminate superior cavoatrial junction. No displaced fracture IMPRESSION: Right IJ port catheter with the tip appearing to terminate superior cavoatrial junction, and no complicating features. Electronically Signed   By: Corrie Mckusick D.O.   On: 03/02/2019 14:06   Dg C-arm 1-60 Min-no Report  Result Date: 03/02/2019 Fluoroscopy was utilized by the requesting physician.  No radiographic interpretation.    Assessment: s/p Procedure(s): INSERTION PORT-A-CATH WITH [place] LAPAROSCOPY COLOSTOMY CREATION, SMALL BOWEL RESECTION, BLADDER REPAIR Patient Active Problem List   Diagnosis Date Noted  . Local recurrence of rectal cancer  ([place]) 01/09/2017  . Adenocarcinoma of colon metastatic to liver ([place]) 07/25/2016  . Osteopenia, h/o OP  06/07/2016  . Anemia, iron deficiency 06/07/2016  . Sigmoid colon cancer s/p lap assisted sigmoid colectomy 08/09/15 08/09/2015  . Overweight (BMI 25.0-29.9) 09/10/2012  . S/P left UKR 09/09/2012  . ELEVATED BLOOD PRESSURE WITHOUT DIAGNOSIS OF HYPERTENSION  09/30/2010  . PHARYNGITIS 02/17/2008  . GANGLION OF TENDON SHEATH 12/20/2007  . FATTY LIVER DISEASE 08/13/2007  . Hypothyroidism 08/02/2007  . CYSTITIS 08/02/2007  . Osteoarthritis 08/02/2007    Expected post op course  Plan: do not d/c foley  Cont NPO until some air in bag.   Ambulate Wean O2    LOS: 1 day     .[name], [facility], [place]   03/03/2019 9:51 AM

## 2019-03-04 LAB — CBC
HCT: 27.4 % — ABNORMAL LOW (ref 36.0–46.0)
Hemoglobin: 8.3 g/dL — ABNORMAL LOW (ref 12.0–15.0)
MCH: 25.2 pg — ABNORMAL LOW (ref 26.0–34.0)
MCHC: 30.3 g/dL (ref 30.0–36.0)
MCV: 83.3 fL (ref 80.0–100.0)
Platelets: 212 10*3/uL (ref 150–400)
RBC: 3.29 MIL/uL — ABNORMAL LOW (ref 3.87–5.11)
RDW: 15.8 % — ABNORMAL HIGH (ref 11.5–15.5)
WBC: 7.5 10*3/uL (ref 4.0–10.5)
nRBC: 0 % (ref 0.0–0.2)

## 2019-03-04 LAB — BASIC METABOLIC PANEL
Anion gap: 7 (ref 5–15)
BUN: 8 mg/dL (ref 8–23)
CO2: 22 mmol/L (ref 22–32)
Calcium: 7.9 mg/dL — ABNORMAL LOW (ref 8.9–10.3)
Chloride: 104 mmol/L (ref 98–111)
Creatinine, Ser: 0.85 mg/dL (ref 0.44–1.00)
GFR calc Af Amer: >60 mL/min (ref >60)
GFR calc non Af Amer: >60 mL/min (ref >60)
Glucose, Bld: 117 mg/dL — ABNORMAL HIGH (ref 70–99)
Potassium: 3.8 mmol/L (ref 3.5–5.1)
Sodium: 133 mmol/L — ABNORMAL LOW (ref 135–145)

## 2019-03-04 NOTE — Progress Notes (Signed)
2 Days Post-Op lap converted to open loop transverse colostomy creation, small bowel resection, bladder repair Subjective: Doing well, ambulated in hall.  No nausea, pain controlled.  Objective: Vital signs in last 24 hours: Temp:  [98 F (36.7 C)-98.5 F (36.9 C)] 98.5 F (36.9 C) (04/17 0555) Pulse Rate:  [75-78] 75 (04/17 0555) Resp:  [18] 18 (04/17 0555) BP: (98-120)/(59-74) 120/74 (04/17 0555) SpO2:  [96 %-100 %] 98 % (04/17 0555) Weight:  [65.5 kg] 65.5 kg (04/17 0631)   Intake/Output from previous day: 04/16 0701 - 04/17 0700 In: 2674.4 [P.O.:300; I.V.:2374.4] Out: 5361 [Urine:3200; Drains:115; Stool:100] Intake/Output this shift: No intake/output data recorded.   General appearance: alert and cooperative GI: normal findings: soft, non-tender, non-distended Ostomy: dark red, air in bag Incision: bloody drainage  Lab Results:  Recent Labs    03/03/19 0342 03/04/19 0320  WBC 9.0 7.5  HGB 8.7* 8.3*  HCT 29.3* 27.4*  PLT 230 212   BMET Recent Labs    03/03/19 0342 03/04/19 0320  NA 133* 133*  K 3.8 3.8  CL 102 104  CO2 23 22  GLUCOSE 128* 117*  BUN 9 8  CREATININE 0.96 0.85  CALCIUM 8.0* 7.9*   PT/INR No results for input(s): LABPROT, INR in the last 72 hours. ABG No results for input(s): PHART, HCO3 in the last 72 hours.  Invalid input(s): PCO2, PO2  MEDS, Scheduled . acetaminophen  1,000 mg Oral Q6H  . alvimopan  12 mg Oral BID  . enoxaparin (LOVENOX) injection  40 mg Subcutaneous Q24H  . feeding supplement  237 mL Oral BID BM  . gabapentin  300 mg Oral BID  . levothyroxine  150 mcg Oral Q0600  . saccharomyces boulardii  250 mg Oral BID    Studies/Results: Dg Abd 1 View  Result Date: 03/02/2019 CLINICAL DATA:  Incorrect suture count. EXAM: ABDOMEN - 1 VIEW COMPARISON:  CT abdomen pelvis dated January 11, 2019. FINDINGS: Several intraoperative fluoroscopic images were submitted for review. Surgical staples are noted in the right lower  quadrant and midline pelvis. Thin radiopaque linear density projecting over the L3 vertebral body is also favored to represent a surgical staple en face. Surgical drain projects over the pelvis. New left lower quadrant ostomy. IMPRESSION: Surgical staples and drain noted. Thin radiopaque linear density projecting over the L3 vertebral body is favored to represent a surgical staple en face. Correlate with surgical procedure. These results were called by telephone at the time of interpretation on 03/02/2019 at 12:15 pm to [place], who verbally acknowledged these results. Electronically Signed   By: Titus Dubin M.D.   On: 03/02/2019 12:18   Dg Chest Port 1 View  Result Date: 03/02/2019 CLINICAL DATA:  78 year old female status post port catheter placement EXAM: PORTABLE CHEST 1 VIEW COMPARISON:  None. FINDINGS: Cardiomediastinal silhouette within normal limits. No pneumothorax or pleural effusion. No confluent airspace disease. No evidence of interlobular septal thickening/vascular congestion. Right IJ port catheter with the tip appearing to terminate superior cavoatrial junction. No displaced fracture IMPRESSION: Right IJ port catheter with the tip appearing to terminate superior cavoatrial junction, and no complicating features. Electronically Signed   By: Corrie Mckusick D.O.   On: 03/02/2019 14:06   Dg C-arm 1-60 Min-no Report  Result Date: 03/02/2019 Fluoroscopy was utilized by the requesting physician.  No radiographic interpretation.    Assessment: s/p Procedure(s): INSERTION PORT-A-CATH WITH [place] LAPAROSCOPY COLOSTOMY CREATION, SMALL BOWEL RESECTION, BLADDER REPAIR Patient Active Problem List   Diagnosis  Date Noted  . Local recurrence of rectal cancer  ([place]) 01/09/2017  . Adenocarcinoma of colon metastatic to liver ([place]) 07/25/2016  . Osteopenia, h/o OP 06/07/2016  . Anemia, iron deficiency 06/07/2016  . Sigmoid colon cancer s/p lap assisted sigmoid colectomy 08/09/15 08/09/2015  .  Overweight (BMI 25.0-29.9) 09/10/2012  . S/P left UKR 09/09/2012  . ELEVATED BLOOD PRESSURE WITHOUT DIAGNOSIS OF HYPERTENSION 09/30/2010  . PHARYNGITIS 02/17/2008  . GANGLION OF TENDON SHEATH 12/20/2007  . FATTY LIVER DISEASE 08/13/2007  . Hypothyroidism 08/02/2007  . CYSTITIS 08/02/2007  . Osteoarthritis 08/02/2007    Expected post op course  Plan: do not d/c foley  Sips of clears Ambulate     LOS: 2 days     .[name], [facility], [place]   03/04/2019 7:35 AM

## 2019-03-05 LAB — BASIC METABOLIC PANEL
Anion gap: 7 (ref 5–15)
BUN: 8 mg/dL (ref 8–23)
CO2: 22 mmol/L (ref 22–32)
Calcium: 7.9 mg/dL — ABNORMAL LOW (ref 8.9–10.3)
Chloride: 103 mmol/L (ref 98–111)
Creatinine, Ser: 0.72 mg/dL (ref 0.44–1.00)
GFR calc Af Amer: >60 mL/min (ref >60)
GFR calc non Af Amer: >60 mL/min (ref >60)
Glucose, Bld: 100 mg/dL — ABNORMAL HIGH (ref 70–99)
Potassium: 3.9 mmol/L (ref 3.5–5.1)
Sodium: 132 mmol/L — ABNORMAL LOW (ref 135–145)

## 2019-03-05 LAB — CBC
HCT: 26.4 % — ABNORMAL LOW (ref 36.0–46.0)
Hemoglobin: 8.3 g/dL — ABNORMAL LOW (ref 12.0–15.0)
MCH: 26.2 pg (ref 26.0–34.0)
MCHC: 31.4 g/dL (ref 30.0–36.0)
MCV: 83.3 fL (ref 80.0–100.0)
Platelets: 229 10*3/uL (ref 150–400)
RBC: 3.17 MIL/uL — ABNORMAL LOW (ref 3.87–5.11)
RDW: 15.7 % — ABNORMAL HIGH (ref 11.5–15.5)
WBC: 8.3 10*3/uL (ref 4.0–10.5)
nRBC: 0 % (ref 0.0–0.2)

## 2019-03-05 NOTE — Plan of Care (Signed)
Patient lying in bed this morning; complains of pain in abdcominal area and requests pain medication. No other concerns voiced. Will continue to monitor.

## 2019-03-05 NOTE — Progress Notes (Signed)
Patient agreed to take pain medication prior to walking. Delegated to the nurse tech the task of ambulating the patient. Charge nurse offered to ambulate patient. Patient stated that she didn't want to walk while her pain was currently under control. Will continue to encourage patient to ambulate and reiterate the importance of ambulating.

## 2019-03-05 NOTE — Progress Notes (Signed)
3 Days Post-Op lap converted to open loop transverse colostomy creation, small bowel resection, bladder repair Subjective: Doing well, ambulated in hall.  No nausea, pain controlled.  Having ostomy function  Objective: Vital signs in last 24 hours: Temp:  [98.9 F (37.2 C)-99 F (37.2 C)] 98.9 F (37.2 C) (04/18 0517) Pulse Rate:  [71-84] 84 (04/18 0517) Resp:  [16-17] 16 (04/18 0517) BP: (125-130)/(71-76) 125/75 (04/18 0517) SpO2:  [97 %-99 %] 97 % (04/18 0517) Weight:  [68.3 kg] 68.3 kg (04/18 0433)   Intake/Output from previous day: 04/17 0701 - 04/18 0700 In: 2873.7 [P.O.:700; I.V.:2173.7] Out: 3570 [Urine:2400; Drains:70; NOBSJ:6283] Intake/Output this shift: No intake/output data recorded.   General appearance: alert and cooperative GI: normal findings: soft, non-tender, non-distended Ostomy: beefy red, air and stool in bag Incision: clean, dry, intact  Lab Results:  Recent Labs    03/04/19 0320 03/05/19 0315  WBC 7.5 8.3  HGB 8.3* 8.3*  HCT 27.4* 26.4*  PLT 212 229   BMET Recent Labs    03/04/19 0320 03/05/19 0315  NA 133* 132*  K 3.8 3.9  CL 104 103  CO2 22 22  GLUCOSE 117* 100*  BUN 8 8  CREATININE 0.85 0.72  CALCIUM 7.9* 7.9*   PT/INR No results for input(s): LABPROT, INR in the last 72 hours. ABG No results for input(s): PHART, HCO3 in the last 72 hours.  Invalid input(s): PCO2, PO2  MEDS, Scheduled . acetaminophen  1,000 mg Oral Q6H  . enoxaparin (LOVENOX) injection  40 mg Subcutaneous Q24H  . feeding supplement  237 mL Oral BID BM  . gabapentin  300 mg Oral BID  . levothyroxine  150 mcg Oral Q0600  . saccharomyces boulardii  250 mg Oral BID    Studies/Results: No results found.  Assessment: s/p Procedure(s): INSERTION PORT-A-CATH WITH [place] LAPAROSCOPY COLOSTOMY CREATION, SMALL BOWEL RESECTION, BLADDER REPAIR Patient Active Problem List   Diagnosis Date Noted  . Local recurrence of rectal cancer  ([place]) 01/09/2017  .  Adenocarcinoma of colon metastatic to liver ([place]) 07/25/2016  . Osteopenia, h/o OP 06/07/2016  . Anemia, iron deficiency 06/07/2016  . Sigmoid colon cancer s/p lap assisted sigmoid colectomy 08/09/15 08/09/2015  . Overweight (BMI 25.0-29.9) 09/10/2012  . S/P left UKR 09/09/2012  . ELEVATED BLOOD PRESSURE WITHOUT DIAGNOSIS OF HYPERTENSION 09/30/2010  . PHARYNGITIS 02/17/2008  . GANGLION OF TENDON SHEATH 12/20/2007  . FATTY LIVER DISEASE 08/13/2007  . Hypothyroidism 08/02/2007  . CYSTITIS 08/02/2007  . Osteoarthritis 08/02/2007    Expected post op course  Plan: do not d/c foley  Advance diet Ambulate Ostomy teaching    LOS: 3 days     .[name], [facility], [place]   03/05/2019 7:42 AM

## 2019-03-06 NOTE — Progress Notes (Signed)
4 Days Post-Op lap converted to open loop transverse colostomy creation, small bowel resection, bladder repair Subjective: Doing well, had more pain yesterday.  Did not ambulate in hall.  No nausea.  Having ostomy function  Objective: Vital signs in last 24 hours: Temp:  [98.4 F (36.9 C)-98.8 F (37.1 C)] 98.8 F (37.1 C) (04/19 0636) Pulse Rate:  [71-85] 71 (04/19 0636) Resp:  [16-17] 16 (04/19 0636) BP: (126-127)/(70-77) 126/73 (04/19 0636) SpO2:  [97 %-100 %] 99 % (04/19 0636)   Intake/Output from previous day: 04/18 0701 - 04/19 0700 In: 240 [P.O.:240] Out: 2800 [Urine:2400; Stool:400] Intake/Output this shift: No intake/output data recorded.   General appearance: alert and cooperative GI: normal findings: soft, non-tender, non-distended Ostomy: beefy red with some sloughing, air and stool in bag Incision: clean, dry, intact  Lab Results:  Recent Labs    03/04/19 0320 03/05/19 0315  WBC 7.5 8.3  HGB 8.3* 8.3*  HCT 27.4* 26.4*  PLT 212 229   BMET Recent Labs    03/04/19 0320 03/05/19 0315  NA 133* 132*  K 3.8 3.9  CL 104 103  CO2 22 22  GLUCOSE 117* 100*  BUN 8 8  CREATININE 0.85 0.72  CALCIUM 7.9* 7.9*   PT/INR No results for input(s): LABPROT, INR in the last 72 hours. ABG No results for input(s): PHART, HCO3 in the last 72 hours.  Invalid input(s): PCO2, PO2  MEDS, Scheduled . acetaminophen  1,000 mg Oral Q6H  . enoxaparin (LOVENOX) injection  40 mg Subcutaneous Q24H  . feeding supplement  237 mL Oral BID BM  . gabapentin  300 mg Oral BID  . levothyroxine  150 mcg Oral Q0600  . saccharomyces boulardii  250 mg Oral BID    Studies/Results: No results found.  Assessment: s/p Procedure(s): INSERTION PORT-A-CATH WITH [place] LAPAROSCOPY COLOSTOMY CREATION, SMALL BOWEL RESECTION, BLADDER REPAIR Patient Active Problem List   Diagnosis Date Noted  . Local recurrence of rectal cancer  ([place]) 01/09/2017  . Adenocarcinoma of colon metastatic to  liver ([place]) 07/25/2016  . Osteopenia, h/o OP 06/07/2016  . Anemia, iron deficiency 06/07/2016  . Sigmoid colon cancer s/p lap assisted sigmoid colectomy 08/09/15 08/09/2015  . Overweight (BMI 25.0-29.9) 09/10/2012  . S/P left UKR 09/09/2012  . ELEVATED BLOOD PRESSURE WITHOUT DIAGNOSIS OF HYPERTENSION 09/30/2010  . PHARYNGITIS 02/17/2008  . GANGLION OF TENDON SHEATH 12/20/2007  . FATTY LIVER DISEASE 08/13/2007  . Hypothyroidism 08/02/2007  . CYSTITIS 08/02/2007  . Osteoarthritis 08/02/2007    Expected post op course  Plan: do not d/c foley  Cont diet Ambulate in hall Check JP for Cr, if normal will d/c JP Ostomy teaching    LOS: 4 days     .Nichole Cross, Nichole Cross, Nichole Cross   03/06/2019 7:43 AM

## 2019-03-07 LAB — CREATININE, FLUID (PLEURAL, PERITONEAL, JP DRAINAGE): Creat, Fluid: 0.8 mg/dL

## 2019-03-07 NOTE — Plan of Care (Signed)
  Problem: Education: Goal: Required Educational Video(s) Outcome: Progressing   Problem: Skin Integrity: Goal: Risk for impaired skin integrity will decrease Outcome: Progressing   Problem: Education: Goal: Knowledge of General Education information will improve Description Including pain rating scale, medication(s)/side effects and non-pharmacologic comfort measures Outcome: Progressing   Problem: Health Behavior/Discharge Planning: Goal: Ability to manage health-related needs will improve Outcome: Progressing   Problem: Clinical Measurements: Goal: Ability to maintain clinical measurements within normal limits will improve Outcome: Progressing Goal: Will remain free from infection Outcome: Progressing Goal: Diagnostic test results will improve Outcome: Progressing Goal: Respiratory complications will improve Outcome: Progressing Goal: Cardiovascular complication will be avoided Outcome: Progressing   Problem: Activity: Goal: Risk for activity intolerance will decrease Outcome: Progressing   Problem: Nutrition: Goal: Adequate nutrition will be maintained Outcome: Progressing   Problem: Coping: Goal: Level of anxiety will decrease Outcome: Progressing   Problem: Elimination: Goal: Will not experience complications related to bowel motility Outcome: Progressing Goal: Will not experience complications related to urinary retention Outcome: Progressing   Problem: Pain Managment: Goal: General experience of comfort will improve Outcome: Progressing   Problem: Safety: Goal: Ability to remain free from injury will improve Outcome: Progressing   Problem: Skin Integrity: Goal: Risk for impaired skin integrity will decrease Outcome: Progressing   Careplan discussed with patient

## 2019-03-07 NOTE — Consult Note (Signed)
Explained role of ostomy nurse and creation of stoma  Explained stoma characteristics (budded, flush, color, texture, care), budded, moist, pink, oval shaped. 1 5/8 inches side to side.  Support rod removed at our session today. Demonstrated pouch change (cutting new skin barrier, measuring stoma, cleaning peristomal skin and stoma, use of barrier ring) Patient removed existing pouch and skin barrier, cleansed the skin, placed a barrier ring, cut the new skin barrier, and attached the pouch. She also opened/closed the pouch Education on emptying when 1/3 to 1/2 full and how to empty Demonstrated "burping" flatus from pouch Demonstrated use of wick to clean spout  Discussed bathing, diet, gas, medication use, constipation Discussed risk of peristomal hernia Discussed treatment of peristomal skin : barrier ring Answered patient/family questions: All questions answered to her expressed satisfaction. Patient enrolled in the [place] start program. Products used: 2 piece flat Kellie Simmering #2 and [phone]) and barrier ring Kellie Simmering [phone]).  Val Riles, RN, MSN, CWOCN, CNS-BC, pager [phone]

## 2019-03-07 NOTE — Care Management Important Message (Signed)
Important Message  Patient Details IM Letter given to the Case Manager to present to the Patient Name: Nichole Cross MRN: [id number] Date of Birth: [birth year]   Medicare Important Message Given:  Yes    [name] 03/07/2019, 11:48 AMImportant Message  Patient Details  Name: Nichole Cross MRN: [id number] Date of Birth: 1941/10/19   Medicare Important Message Given:  Yes    [name] 03/07/2019, 11:48 AM

## 2019-03-07 NOTE — Discharge Instructions (Addendum)
ABDOMINAL SURGERY: POST OP INSTRUCTIONS  1. DIET: Follow a light bland diet the first 24 hours after arrival home, such as soup, liquids, crackers, etc.  Be sure to include lots of fluids daily.  Avoid fast food or heavy meals as your are more likely to get nauseated.  Do not eat any uncooked fruits or vegetables for the next 2 weeks as your colon heals. 2. Take your usually prescribed home medications unless otherwise directed. 3. PAIN CONTROL: a. Pain is best controlled by a usual combination of three different methods TOGETHER: i. Ice/Heat ii. Over the counter pain medication iii. Prescription pain medication b. Most patients will experience some swelling and bruising around the incisions.  Ice packs or heating pads (30-60 minutes up to 6 times a day) will help. Use ice for the first few days to help decrease swelling and bruising, then switch to heat to help relax tight/sore spots and speed recovery.  Some people prefer to use ice alone, heat alone, alternating between ice & heat.  Experiment to what works for you.  Swelling and bruising can take several weeks to resolve.   c. It is helpful to take an over-the-counter pain medication regularly for the first few weeks.  Choose one of the following that works best for you: i. Naproxen (Aleve, etc)  Two 220mg  tabs twice a day ii. Ibuprofen (Advil, etc) Three 200mg  tabs four times a day (every meal & bedtime) iii. Acetaminophen (Tylenol, etc) 500-650mg  four times a day (every meal & bedtime) d. A  prescription for pain medication (such as oxycodone, hydrocodone, etc) should be given to you upon discharge.  Take your pain medication as prescribed.  i. If you are having problems/concerns with the prescription medicine (does not control pain, nausea, vomiting, rash, itching, etc), please call us [phone] to see if we need to switch you to a different pain medicine that will work better for you and/or control your side effect better. ii. If you  need a refill on your pain medication, please contact your pharmacy.  They will contact our office to request authorization. Prescriptions will not be filled after 5 pm or on week-ends. 4. Avoid getting constipated.  Between the surgery and the pain medications, it is common to experience some constipation.  Increasing fluid intake and taking a fiber supplement (such as Metamucil, Citrucel, FiberCon, MiraLax, etc) 1-2 times a day regularly will usually help prevent this problem from occurring.  A mild laxative (prune juice, Milk of Magnesia, MiraLax, etc) should be taken according to package directions if there are no bowel movements after 48 hours.   5. Watch out for diarrhea.  If you have many loose bowel movements, simplify your diet to bland foods & liquids for a few days.  Stop any stool softeners and decrease your fiber supplement.  Switching to mild anti-diarrheal medications (Kayopectate, Pepto Bismol) can help.  If this worsens or does not improve, please call us. 6. Wash / shower every day.  You may shower over the incision / wound.  Remove packing and wash open wound with soap and water.  Pack with saline moistened gauze and place a dry dressing.    7. ACTIVITIES as tolerated:   a. You may resume regular (light) daily activities beginning the next day--such as daily self-care, walking, climbing stairs--gradually increasing activities as tolerated.  If you can walk 30 minutes without difficulty, it is safe to try more intense activity such as jogging, treadmill, bicycling, low-impact aerobics, swimming, etc. b.  Save the most intensive and strenuous activity for last such as sit-ups, heavy lifting, contact sports, etc  Refrain from any heavy lifting or straining until you are off narcotics for pain control.   c. DO NOT PUSH THROUGH PAIN.  Let pain be your guide: If it hurts to do something, don't do it.  Pain is your body warning you to avoid that activity for another week until the pain goes  down. d. You may drive when you are no longer taking prescription pain medication, you can comfortably wear a seatbelt, and you can safely maneuver your car and apply brakes. e. Dennis Bast may have sexual intercourse when it is comfortable.  8. FOLLOW UP in our office a. Please call [facility] at [phone] to set up an appointment to see your surgeon in the office for a follow-up appointment approximately 1-2 weeks after your surgery. b. Make sure that you call for this appointment the day you arrive home to insure a convenient appointment time. 10. IF YOU HAVE DISABILITY OR FAMILY LEAVE FORMS, BRING THEM TO THE OFFICE FOR PROCESSING.  DO NOT GIVE THEM TO YOUR DOCTOR.   WHEN TO CALL us [phone]: 1. Poor pain control 2. Reactions / problems with new medications (rash/itching, nausea, etc)  3. Fever over 101.5 F (38.5 C) 4. Inability to urinate 5. Nausea and/or vomiting 6. Worsening swelling or bruising 7. Continued bleeding from incision. 8. Increased pain, redness, or drainage from the incision  The clinic staff is available to answer your questions during regular business hours (8:30am-5pm).  Please dont hesitate to call and ask to speak to one of our nurses for clinical concerns.   A surgeon from [facility] is always on call at the hospitals   If you have a medical emergency, go to the nearest emergency room or call 911.    [facility], [place], [place], [place], [place]  [address] ? MAIN: [phone] ? TOLL FREE: [phone] ? FAX [phone] [email or web address]

## 2019-03-07 NOTE — Progress Notes (Signed)
5 Days Post-Op lap converted to open loop transverse colostomy creation, small bowel resection, bladder repair Subjective: Doing well, having less pain, taking tylenol when needed.  No nausea.  Having ostomy function, tolerating a diet  Objective: Vital signs in last 24 hours: Temp:  [98.3 F (36.8 C)-98.5 F (36.9 C)] 98.3 F (36.8 C) (04/20 4098) Pulse Rate:  [72-79] 74 (04/20 0638) Resp:  [16-18] 18 (04/20 1191) BP: (117-126)/(80-83) 117/83 (04/20 4782) SpO2:  [98 %-99 %] 99 % (04/20 9562)   Intake/Output from previous day: 04/19 0701 - 04/20 0700 In: 720 [P.O.:720] Out: 3115 [Urine:3050; Drains:65] Intake/Output this shift: Total I/O In: 600 [P.O.:600] Out: 1815 [Urine:1800; Drains:15]   General appearance: alert and cooperative GI: normal findings: soft, non-tender, non-distended Ostomy: beefy red with some sloughing, air and stool in bag Incision: clean, dry, intact  Lab Results:  Recent Labs    03/05/19 0315  WBC 8.3  HGB 8.3*  HCT 26.4*  PLT 229   BMET Recent Labs    03/05/19 0315  NA 132*  K 3.9  CL 103  CO2 22  GLUCOSE 100*  BUN 8  CREATININE 0.72  CALCIUM 7.9*   PT/INR No results for input(s): LABPROT, INR in the last 72 hours. ABG No results for input(s): PHART, HCO3 in the last 72 hours.  Invalid input(s): PCO2, PO2  MEDS, Scheduled . acetaminophen  1,000 mg Oral Q6H  . enoxaparin (LOVENOX) injection  40 mg Subcutaneous Q24H  . feeding supplement  237 mL Oral BID BM  . gabapentin  300 mg Oral BID  . levothyroxine  150 mcg Oral Q0600  . saccharomyces boulardii  250 mg Oral BID    Studies/Results: No results found.  Assessment: s/p Procedure(s): INSERTION PORT-A-CATH WITH [place] LAPAROSCOPY COLOSTOMY CREATION, SMALL BOWEL RESECTION, BLADDER REPAIR Patient Active Problem List   Diagnosis Date Noted  . Local recurrence of rectal cancer  ([place]) 01/09/2017  . Adenocarcinoma of colon metastatic to liver ([place]) 07/25/2016  . Osteopenia,  h/o OP 06/07/2016  . Anemia, iron deficiency 06/07/2016  . Sigmoid colon cancer s/p lap assisted sigmoid colectomy 08/09/15 08/09/2015  . Overweight (BMI 25.0-29.9) 09/10/2012  . S/P left UKR 09/09/2012  . ELEVATED BLOOD PRESSURE WITHOUT DIAGNOSIS OF HYPERTENSION 09/30/2010  . PHARYNGITIS 02/17/2008  . GANGLION OF TENDON SHEATH 12/20/2007  . FATTY LIVER DISEASE 08/13/2007  . Hypothyroidism 08/02/2007  . CYSTITIS 08/02/2007  . Osteoarthritis 08/02/2007    Expected post op course  Plan: do not d/c foley  Cont diet Ambulate in hall Check JP for Cr, if normal will d/c JP Ostomy teaching today, ok to remove ostomy bar Hopefully d/c tomorrow    LOS: 5 days     .[name], [facility], [place]   03/07/2019 6:51 AM

## 2019-03-08 MED ORDER — ONDANSETRON HCL 4 MG PO TABS
4.0000 mg | ORAL_TABLET | Freq: Once | ORAL | Status: AC
  Administered 2019-03-08: 4 mg via ORAL
  Filled 2019-03-08: qty 1

## 2019-03-08 NOTE — Progress Notes (Signed)
6 Days Post-Op lap converted to open loop transverse colostomy creation, small bowel resection, bladder repair Subjective: Doing well, had an episode of crampy pain after eating last night requiring IV narcotics.  taking tylenol and tramadol when needed.  No nausea.  Having ostomy function, tolerating a diet  Objective: Vital signs in last 24 hours: Temp:  [98.1 F (36.7 C)-98.5 F (36.9 C)] 98.5 F (36.9 C) (04/21 0554) Pulse Rate:  [73-84] 73 (04/21 0554) Resp:  [16-18] 18 (04/21 0554) BP: (116-137)/(71-82) 116/71 (04/21 0554) SpO2:  [98 %-99 %] 99 % (04/21 0554) Weight:  [68.1 kg] 68.1 kg (04/21 0716)   Intake/Output from previous day: 04/20 0701 - 04/21 0700 In: 240 [P.O.:240] Out: 2875 [Urine:2200; Drains:25; Stool:650] Intake/Output this shift: No intake/output data recorded.   General appearance: alert and cooperative GI: normal findings: soft, non-tender, non-distended Ostomy: beefy red with some sloughing, air and stool in bag Incision: clean, dry, intact  Lab Results:  No results for input(s): WBC, HGB, HCT, PLT in the last 72 hours. BMET No results for input(s): NA, K, CL, CO2, GLUCOSE, BUN, CREATININE, CALCIUM in the last 72 hours. PT/INR No results for input(s): LABPROT, INR in the last 72 hours. ABG No results for input(s): PHART, HCO3 in the last 72 hours.  Invalid input(s): PCO2, PO2  MEDS, Scheduled . acetaminophen  1,000 mg Oral Q6H  . enoxaparin (LOVENOX) injection  40 mg Subcutaneous Q24H  . feeding supplement  237 mL Oral BID BM  . gabapentin  300 mg Oral BID  . levothyroxine  150 mcg Oral Q0600  . saccharomyces boulardii  250 mg Oral BID    Studies/Results: No results found.  Assessment: s/p Procedure(s): INSERTION PORT-A-CATH WITH [place] LAPAROSCOPY COLOSTOMY CREATION, SMALL BOWEL RESECTION, BLADDER REPAIR Patient Active Problem List   Diagnosis Date Noted  . Local recurrence of rectal cancer  ([place]) 01/09/2017  . Adenocarcinoma of colon  metastatic to liver ([place]) 07/25/2016  . Osteopenia, h/o OP 06/07/2016  . Anemia, iron deficiency 06/07/2016  . Sigmoid colon cancer s/p lap assisted sigmoid colectomy 08/09/15 08/09/2015  . Overweight (BMI 25.0-29.9) 09/10/2012  . S/P left UKR 09/09/2012  . ELEVATED BLOOD PRESSURE WITHOUT DIAGNOSIS OF HYPERTENSION 09/30/2010  . PHARYNGITIS 02/17/2008  . GANGLION OF TENDON SHEATH 12/20/2007  . FATTY LIVER DISEASE 08/13/2007  . Hypothyroidism 08/02/2007  . CYSTITIS 08/02/2007  . Osteoarthritis 08/02/2007    Expected post op course  Plan: do not d/c foley  Cont diet Ambulate in hall JP Cr wnl, d/c JP Ostomy teaching today, ostomy bar removed Plan for d/c tomorrow    LOS: 6 days     .Nichole Cross, [facility], [place]   03/08/2019 8:19 AM

## 2019-03-08 NOTE — TOC Transition Note (Signed)
Transition of Care [facility]) - CM/SW Discharge Note   Patient Details  Name: PERNELL DIKES MRN: [id number] Date of Birth: Jun 15, 1941  [facility]) CM/SW Contact:  Leeroy Cha, RN Phone Number: 03/08/2019, 9:03 AM   Clinical Narrative:    [facility] to be done through [facility].   Final next level of care: [place] Barriers to Discharge: No Barriers Identified   Patient Goals and CMS Choice Patient states their goals for this hospitalization and ongoing recovery are:: to be safe and go home CMS Medicare.gov Compare Post Acute Care list provided to:: Patient Choice offered to / list presented to : Patient  Discharge Placement                       Discharge Plan and Services   Discharge Planning Services: CM Consult Post Acute Care Choice: Home Health              HH Arranged: RN [facility] Agency: [place] ([facility])   Social Determinants of Health (SDOH) Interventions     Readmission Risk Interventions No flowsheet data found.

## 2019-03-08 NOTE — Consult Note (Signed)
[place] Nurse ostomy follow up Patient receiving care in [place]. The pouch the patient and I placed yesterday is intact.  Supplies for discharge home are present in the room in a belonging bag.  The patient was able to state all the steps necessary to empty/change the pouching system.  All questions were answered to her expressed satisfaction. I do recommend she receive home health nurse visits briefly to assure a smooth transition to home life. Val Riles, RN, MSN, CWOCN, CNS-BC, pager [phone]

## 2019-03-08 NOTE — Plan of Care (Signed)
Plan of care reviewed and discussed with the patient. 

## 2019-03-09 ENCOUNTER — Inpatient Hospital Stay (HOSPITAL_COMMUNITY): Payer: Medicare Other

## 2019-03-09 NOTE — Progress Notes (Signed)
RN notified on [facility] call physician of the redness and drainage on the patient's midline incision on her abdomen and breakthrough nausea.  Orderd received: Zofran 4mg  PO x1 dose

## 2019-03-09 NOTE — Progress Notes (Signed)
7 Days Post-Op lap converted to open loop transverse colostomy creation, small bowel resection, bladder repair Subjective: Doing well, had an episode of crampy pain after eating last night. taking tylenol and tramadol when needed.  Developed nausea yesterday with min ostomy output.    Objective: Vital signs in last 24 hours: Temp:  [98.4 F (36.9 C)-99.1 F (37.3 C)] 98.4 F (36.9 C) (04/22 0528) Pulse Rate:  [78-81] 81 (04/22 0528) Resp:  [16-18] 18 (04/22 0528) BP: (112-119)/(69-77) 112/69 (04/22 0528) SpO2:  [97 %-98 %] 97 % (04/22 0528) Weight:  [68 kg] 68 kg (04/22 0528)   Intake/Output from previous day: 04/21 0701 - 04/22 0700 In: 1060 [P.O.:1060] Out: 2100 [Urine:2100] Intake/Output this shift: No intake/output data recorded.   General appearance: alert and cooperative GI: normal findings: soft, non-tender, non-distended Ostomy: beefy red with some sloughing, air and stool in bag Incision: purulent drainage noted, several staples removed and purulence evacuated.  Wound packed  Lab Results:  No results for input(s): WBC, HGB, HCT, PLT in the last 72 hours. BMET No results for input(s): NA, K, CL, CO2, GLUCOSE, BUN, CREATININE, CALCIUM in the last 72 hours. PT/INR No results for input(s): LABPROT, INR in the last 72 hours. ABG No results for input(s): PHART, HCO3 in the last 72 hours.  Invalid input(s): PCO2, PO2  MEDS, Scheduled . acetaminophen  1,000 mg Oral Q6H  . enoxaparin (LOVENOX) injection  40 mg Subcutaneous Q24H  . feeding supplement  237 mL Oral BID BM  . gabapentin  300 mg Oral BID  . levothyroxine  150 mcg Oral Q0600  . saccharomyces boulardii  250 mg Oral BID    Studies/Results: No results found.  Assessment: s/p Procedure(s): INSERTION PORT-A-CATH WITH [place] LAPAROSCOPY COLOSTOMY CREATION, SMALL BOWEL RESECTION, BLADDER REPAIR Patient Active Problem List   Diagnosis Date Noted  . Local recurrence of rectal cancer  ([place]) 01/09/2017  .  Adenocarcinoma of colon metastatic to liver ([place]) 07/25/2016  . Osteopenia, h/o OP 06/07/2016  . Anemia, iron deficiency 06/07/2016  . Sigmoid colon cancer s/p lap assisted sigmoid colectomy 08/09/15 08/09/2015  . Overweight (BMI 25.0-29.9) 09/10/2012  . S/P left UKR 09/09/2012  . ELEVATED BLOOD PRESSURE WITHOUT DIAGNOSIS OF HYPERTENSION 09/30/2010  . PHARYNGITIS 02/17/2008  . GANGLION OF TENDON SHEATH 12/20/2007  . FATTY LIVER DISEASE 08/13/2007  . Hypothyroidism 08/02/2007  . CYSTITIS 08/02/2007  . Osteoarthritis 08/02/2007    Expected post op course  Plan: do not d/c foley  Cont diet Ambulate in hall Wound opened and packed AXR today to eval for ileus  CBC and BMET    LOS: 7 days     .[name], [facility], [place]   03/09/2019 8:36 AM

## 2019-03-10 LAB — CBC
HCT: 26.5 % — ABNORMAL LOW (ref 36.0–46.0)
Hemoglobin: 8.2 g/dL — ABNORMAL LOW (ref 12.0–15.0)
MCH: 25 pg — ABNORMAL LOW (ref 26.0–34.0)
MCHC: 30.9 g/dL (ref 30.0–36.0)
MCV: 80.8 fL (ref 80.0–100.0)
Platelets: 319 10*3/uL (ref 150–400)
RBC: 3.28 MIL/uL — ABNORMAL LOW (ref 3.87–5.11)
RDW: 15.3 % (ref 11.5–15.5)
WBC: 5.3 10*3/uL (ref 4.0–10.5)
nRBC: 0 % (ref 0.0–0.2)

## 2019-03-10 LAB — BASIC METABOLIC PANEL
Anion gap: 8 (ref 5–15)
BUN: 14 mg/dL (ref 8–23)
CO2: 25 mmol/L (ref 22–32)
Calcium: 8 mg/dL — ABNORMAL LOW (ref 8.9–10.3)
Chloride: 96 mmol/L — ABNORMAL LOW (ref 98–111)
Creatinine, Ser: 0.78 mg/dL (ref 0.44–1.00)
GFR calc Af Amer: >60 mL/min (ref >60)
GFR calc non Af Amer: >60 mL/min (ref >60)
Glucose, Bld: 105 mg/dL — ABNORMAL HIGH (ref 70–99)
Potassium: 4.1 mmol/L (ref 3.5–5.1)
Sodium: 129 mmol/L — ABNORMAL LOW (ref 135–145)

## 2019-03-10 MED ORDER — HYDROCODONE-ACETAMINOPHEN 5-325 MG PO TABS: 1.0000 | ORAL_TABLET | ORAL | 0 refills | Status: DC | PRN

## 2019-03-10 MED ORDER — HEPARIN SOD (PORK) LOCK FLUSH 100 UNIT/ML IV SOLN: 500.0000 [IU] | INTRAVENOUS | Status: DC | PRN

## 2019-03-10 NOTE — Plan of Care (Signed)
  Problem: Activity: Goal: Risk for activity intolerance will decrease Outcome: Progressing   

## 2019-03-10 NOTE — Progress Notes (Signed)
Porta cath deaccessed. Flushed with 10cc NS followed by Heparin 5ml (100u/ml). No bleeding to site, band aid to site for comfort. [name] M 

## 2019-03-10 NOTE — Progress Notes (Signed)
MD was notified of [place] care requests for foley care upon discharge, instructions were given to call [facility] if there were questions about foley care. This instruction was added to discharge instructions as well. Discharge instructions were given to patient all questions were answered. Patient shows competent skills in changing/emptying ostomy bag and wound care.

## 2019-03-10 NOTE — Discharge Summary (Signed)
Patient ID: Nichole Cross [id number] y.o. [birth year]  03/02/2019  Discharge date and time: 03/10/2019  2:02 PM  Admitting Physician: [name]  Discharge Physician: [name]  Admission Diagnoses: STAGE 4 COLON CANCER  Discharge Diagnoses: same  Operations: Procedure(s): INSERTION PORT-A-CATH WITH [place] LAPAROSCOPY COLOSTOMY CREATION, SMALL BOWEL RESECTION, BLADDER REPAIR    Discharged Condition: good    Hospital Course: patient admitted after difficult lap or scopic converted to open surgery.  A loop transverse colostomy was created.  Her diet was advanced as tolerated.  She did have some trouble with nausea and incisional pain.  She was noted to have a wound infection.  This was opened and packed.  This seemed to resolve her symptoms.  Patient was then discharged in stable condition  She will continue her Foley catheter in until next week when she will get a cystogram and voiding trial.  We will plan on removing the rest of her staples next week and I will see her back in the office on the weeks following that.  She will follow up with Dr. Benay Spice in oncology for discussion of chemotherapy on December 5.  Consults: None  Significant Diagnostic Studies: labs: cbc, bmet  Treatments: IV hydration, analgesia: acetaminophen and surgery: see above  Disposition: Home

## 2019-03-11 DIAGNOSIS — C189 Malignant neoplasm of colon, unspecified: Secondary | ICD-10-CM | POA: Diagnosis not present

## 2019-03-11 DIAGNOSIS — Z483 Aftercare following surgery for neoplasm: Secondary | ICD-10-CM | POA: Diagnosis not present

## 2019-03-11 DIAGNOSIS — M810 Age-related osteoporosis without current pathological fracture: Secondary | ICD-10-CM | POA: Diagnosis not present

## 2019-03-11 DIAGNOSIS — Z466 Encounter for fitting and adjustment of urinary device: Secondary | ICD-10-CM | POA: Diagnosis not present

## 2019-03-11 DIAGNOSIS — Z85528 Personal history of other malignant neoplasm of kidney: Secondary | ICD-10-CM | POA: Diagnosis not present

## 2019-03-11 DIAGNOSIS — M1990 Unspecified osteoarthritis, unspecified site: Secondary | ICD-10-CM | POA: Diagnosis not present

## 2019-03-11 DIAGNOSIS — D508 Other iron deficiency anemias: Secondary | ICD-10-CM | POA: Diagnosis not present

## 2019-03-11 DIAGNOSIS — Z433 Encounter for attention to colostomy: Secondary | ICD-10-CM | POA: Diagnosis not present

## 2019-03-11 DIAGNOSIS — C787 Secondary malignant neoplasm of liver and intrahepatic bile duct: Secondary | ICD-10-CM | POA: Diagnosis not present

## 2019-03-14 ENCOUNTER — Other Ambulatory Visit: Payer: Medicare Other

## 2019-03-14 ENCOUNTER — Other Ambulatory Visit (HOSPITAL_COMMUNITY): Payer: Self-pay | Admitting: General Surgery

## 2019-03-14 ENCOUNTER — Other Ambulatory Visit: Payer: Self-pay | Admitting: General Surgery

## 2019-03-14 DIAGNOSIS — Z9889 Other specified postprocedural states: Secondary | ICD-10-CM

## 2019-03-16 ENCOUNTER — Ambulatory Visit (HOSPITAL_COMMUNITY): Payer: Medicare Other

## 2019-03-16 ENCOUNTER — Ambulatory Visit (HOSPITAL_COMMUNITY)
Admission: RE | Admit: 2019-03-16 | Discharge: 2019-03-16 | Disposition: A | Discharge status: Home / Self Care | Payer: Medicare Other | Source: Ambulatory Visit | Attending: General Surgery | Admitting: General Surgery

## 2019-03-16 ENCOUNTER — Ambulatory Visit: Payer: Medicare Other | Admitting: Nurse Practitioner

## 2019-03-16 ENCOUNTER — Other Ambulatory Visit (HOSPITAL_COMMUNITY): Payer: Self-pay | Admitting: General Surgery

## 2019-03-16 ENCOUNTER — Other Ambulatory Visit: Payer: Self-pay

## 2019-03-16 DIAGNOSIS — Z9889 Other specified postprocedural states: Secondary | ICD-10-CM

## 2019-03-16 DIAGNOSIS — C189 Malignant neoplasm of colon, unspecified: Secondary | ICD-10-CM | POA: Diagnosis not present

## 2019-03-16 DIAGNOSIS — S3720XA Unspecified injury of bladder, initial encounter: Secondary | ICD-10-CM | POA: Diagnosis not present

## 2019-03-16 MED ORDER — IOTHALAMATE MEGLUMINE 17.2 % UR SOLN
250.0000 mL | Freq: Once | URETHRAL | Status: AC | PRN
  Administered 2019-03-16: 200 mL via INTRAVESICAL

## 2019-03-17 DIAGNOSIS — M1990 Unspecified osteoarthritis, unspecified site: Secondary | ICD-10-CM | POA: Diagnosis not present

## 2019-03-17 DIAGNOSIS — D508 Other iron deficiency anemias: Secondary | ICD-10-CM | POA: Diagnosis not present

## 2019-03-17 DIAGNOSIS — Z85528 Personal history of other malignant neoplasm of kidney: Secondary | ICD-10-CM | POA: Diagnosis not present

## 2019-03-17 DIAGNOSIS — Z483 Aftercare following surgery for neoplasm: Secondary | ICD-10-CM | POA: Diagnosis not present

## 2019-03-17 DIAGNOSIS — C787 Secondary malignant neoplasm of liver and intrahepatic bile duct: Secondary | ICD-10-CM | POA: Diagnosis not present

## 2019-03-17 DIAGNOSIS — C189 Malignant neoplasm of colon, unspecified: Secondary | ICD-10-CM | POA: Diagnosis not present

## 2019-03-17 DIAGNOSIS — Z466 Encounter for fitting and adjustment of urinary device: Secondary | ICD-10-CM | POA: Diagnosis not present

## 2019-03-17 DIAGNOSIS — Z433 Encounter for attention to colostomy: Secondary | ICD-10-CM | POA: Diagnosis not present

## 2019-03-17 DIAGNOSIS — M810 Age-related osteoporosis without current pathological fracture: Secondary | ICD-10-CM | POA: Diagnosis not present

## 2019-03-19 DIAGNOSIS — M810 Age-related osteoporosis without current pathological fracture: Secondary | ICD-10-CM | POA: Diagnosis not present

## 2019-03-19 DIAGNOSIS — M1990 Unspecified osteoarthritis, unspecified site: Secondary | ICD-10-CM | POA: Diagnosis not present

## 2019-03-19 DIAGNOSIS — Z483 Aftercare following surgery for neoplasm: Secondary | ICD-10-CM | POA: Diagnosis not present

## 2019-03-19 DIAGNOSIS — D508 Other iron deficiency anemias: Secondary | ICD-10-CM | POA: Diagnosis not present

## 2019-03-19 DIAGNOSIS — Z433 Encounter for attention to colostomy: Secondary | ICD-10-CM | POA: Diagnosis not present

## 2019-03-19 DIAGNOSIS — Z466 Encounter for fitting and adjustment of urinary device: Secondary | ICD-10-CM | POA: Diagnosis not present

## 2019-03-19 DIAGNOSIS — Z85528 Personal history of other malignant neoplasm of kidney: Secondary | ICD-10-CM | POA: Diagnosis not present

## 2019-03-19 DIAGNOSIS — C189 Malignant neoplasm of colon, unspecified: Secondary | ICD-10-CM | POA: Diagnosis not present

## 2019-03-19 DIAGNOSIS — C787 Secondary malignant neoplasm of liver and intrahepatic bile duct: Secondary | ICD-10-CM | POA: Diagnosis not present

## 2019-03-22 ENCOUNTER — Telehealth: Payer: Self-pay | Admitting: Oncology

## 2019-03-22 ENCOUNTER — Inpatient Hospital Stay: Payer: Medicare Other

## 2019-03-22 ENCOUNTER — Other Ambulatory Visit: Payer: Self-pay | Admitting: *Deleted

## 2019-03-22 ENCOUNTER — Other Ambulatory Visit: Payer: Self-pay

## 2019-03-22 ENCOUNTER — Inpatient Hospital Stay: Payer: Medicare Other | Attending: Oncology | Admitting: Oncology

## 2019-03-22 VITALS — BP 101/62 | HR 79 | Temp 96.9°F | Resp 18 | Ht 67.0 in | Wt 138.1 lb

## 2019-03-22 DIAGNOSIS — D509 Iron deficiency anemia, unspecified: Secondary | ICD-10-CM | POA: Insufficient documentation

## 2019-03-22 DIAGNOSIS — D701 Agranulocytosis secondary to cancer chemotherapy: Secondary | ICD-10-CM | POA: Diagnosis not present

## 2019-03-22 DIAGNOSIS — C187 Malignant neoplasm of sigmoid colon: Secondary | ICD-10-CM | POA: Insufficient documentation

## 2019-03-22 DIAGNOSIS — Z933 Colostomy status: Secondary | ICD-10-CM | POA: Insufficient documentation

## 2019-03-22 DIAGNOSIS — Z5111 Encounter for antineoplastic chemotherapy: Secondary | ICD-10-CM | POA: Diagnosis not present

## 2019-03-22 DIAGNOSIS — C787 Secondary malignant neoplasm of liver and intrahepatic bile duct: Principal | ICD-10-CM

## 2019-03-22 DIAGNOSIS — R197 Diarrhea, unspecified: Secondary | ICD-10-CM | POA: Diagnosis not present

## 2019-03-22 DIAGNOSIS — Z7189 Other specified counseling: Secondary | ICD-10-CM | POA: Insufficient documentation

## 2019-03-22 DIAGNOSIS — N2889 Other specified disorders of kidney and ureter: Secondary | ICD-10-CM | POA: Diagnosis not present

## 2019-03-22 DIAGNOSIS — C20 Malignant neoplasm of rectum: Secondary | ICD-10-CM

## 2019-03-22 DIAGNOSIS — G893 Neoplasm related pain (acute) (chronic): Secondary | ICD-10-CM

## 2019-03-22 DIAGNOSIS — C189 Malignant neoplasm of colon, unspecified: Secondary | ICD-10-CM

## 2019-03-22 LAB — CEA (IN HOUSE-CHCC): CEA (CHCC-In House): 88.84 ng/mL — ABNORMAL HIGH (ref 0.00–5.00)

## 2019-03-22 LAB — CMP (CANCER CENTER ONLY)
ALT: 8 U/L (ref 0–44)
AST: 21 U/L (ref 15–41)
Albumin: 2.5 g/dL — ABNORMAL LOW (ref 3.5–5.0)
Alkaline Phosphatase: 188 U/L — ABNORMAL HIGH (ref 38–126)
Anion gap: 13 (ref 5–15)
BUN: 16 mg/dL (ref 8–23)
CO2: 20 mmol/L — ABNORMAL LOW (ref 22–32)
Calcium: 8.5 mg/dL — ABNORMAL LOW (ref 8.9–10.3)
Chloride: 101 mmol/L (ref 98–111)
Creatinine: 0.86 mg/dL (ref 0.44–1.00)
GFR, Est AFR Am: >60 mL/min (ref >60)
GFR, Estimated: >60 mL/min (ref >60)
Glucose, Bld: 109 mg/dL — ABNORMAL HIGH (ref 70–99)
Potassium: 3.6 mmol/L (ref 3.5–5.1)
Sodium: 134 mmol/L — ABNORMAL LOW (ref 135–145)
Total Bilirubin: 1 mg/dL (ref 0.3–1.2)
Total Protein: 7.1 g/dL (ref 6.5–8.1)

## 2019-03-22 MED ORDER — OXYCODONE-ACETAMINOPHEN 5-325 MG PO TABS
1.0000 | ORAL_TABLET | Freq: Once | ORAL | Status: AC
  Administered 2019-03-22: 1 via ORAL

## 2019-03-22 MED ORDER — OXYCODONE-ACETAMINOPHEN 5-325 MG PO TABS: 1.0000 | ORAL_TABLET | ORAL | 0 refills | Status: DC | PRN

## 2019-03-22 MED ORDER — OXYCODONE-ACETAMINOPHEN 5-325 MG PO TABS
ORAL_TABLET | ORAL | Status: AC
  Filled 2019-03-22: qty 1

## 2019-03-22 NOTE — Telephone Encounter (Signed)
Scheduled appt per 5.5 los.  Added treatment in the book for approval, 5/11 and 5/26.  Will contact patient once appts have been scheduled.

## 2019-03-22 NOTE — Progress Notes (Signed)
START ON PATHWAY REGIMEN - Colorectal     A cycle is every 14 days:     Oxaliplatin      Leucovorin      5-Fluorouracil      5-Fluorouracil   **Always confirm dose/schedule in your pharmacy ordering system**  Patient Characteristics: Distant Metastases, First Line, Nonsurgical Candidate, KRAS Mutation Positive/Unknown, BRAF Wild-Type/Unknown, PS = 0,1; Bevacizumab Ineligible Therapeutic Status: Distant Metastases BRAF Mutation Status: Wild-Type (no mutation) KRAS/NRAS Mutation Status: Mutation Positive Line of Therapy: First Line ECOG Performance Status: 1 Bevacizumab Eligibility: Ineligible Intent of Therapy: Non-Curative / Palliative Intent, Discussed with Patient

## 2019-03-22 NOTE — Progress Notes (Signed)
[place] OFFICE PROGRESS NOTE   Diagnosis: Colon cancer  INTERVAL HISTORY:   Nichole Cross was taken the operating room by Dr. Marcello Moores on 03/02/2019 for a laparoscopic colostomy and insertion of a Port-A-Cath.  She was noted to have a "frozen" pelvis with tumor studding the descending colon.  She reports the colostomy is functioning well.  She complains of rectal pain.  The pain is not relieved with hydrocodone.  She is taking hydrocodone every 4 hours and wakes up at night to take pain medication.  She has a good appetite.  The wound is healing.  Objective:  Vital signs in last 24 hours:  Blood pressure 101/62, pulse 79, temperature (!) 96.9 F (36.1 C), temperature source Oral, resp. rate 18, height '5\' 7"'  (1.702 m), weight 138 lb 1.6 oz (62.6 kg), SpO2 100 %.    Resp: Lungs clear bilaterally Cardio: Regular rate and rhythm GI: No hepatomegaly, nontender, left lower quadrant colostomy, 2 cm opening at the midportion of the midline wound without evidence of infection Vascular: No leg edema    Portacath/PICC-without erythema  Lab Results:  Lab Results  Component Value Date   WBC 5.3 03/10/2019   HGB 8.2 (L) 03/10/2019   HCT 26.5 (L) 03/10/2019   MCV 80.8 03/10/2019   PLT 319 03/10/2019   NEUTROABS 1.5 (L) 10/07/2018    CMP  Lab Results  Component Value Date   NA 134 (L) 03/22/2019   K 3.6 03/22/2019   CL 101 03/22/2019   CO2 20 (L) 03/22/2019   GLUCOSE 109 (H) 03/22/2019   BUN 16 03/22/2019   CREATININE 0.86 03/22/2019   CALCIUM 8.5 (L) 03/22/2019   PROT 7.1 03/22/2019   ALBUMIN 2.5 (L) 03/22/2019   AST 21 03/22/2019   ALT 8 03/22/2019   ALKPHOS 188 (H) 03/22/2019   BILITOT 1.0 03/22/2019   GFRNONAA >60 03/22/2019   GFRAA >60 03/22/2019    Lab Results  Component Value Date   CEA1 89.95 (H) 02/15/2019    Lab Results  Component Value Date   INR 1.07 05/17/2018    Imaging:  No results found.  Medications: I have reviewed the  patient's current medications.   Assessment/Plan: 1.  Adenocarcinoma the distal sigmoid colon, poorly differentiated, stage II (T3 N0), status post a laparoscopic assisted sigmoid colectomy 08/09/2015  No loss of mismatch repair protein expression  Elevated preoperative CEA  Staging CT of the abdomen and pelvis 06/22/2015 with no evidence of distant metastatic disease  Mildly elevated CEA 02/23/2017And 04/17/2016  CTs chest, abdomen, and pelvis on 06/04/2016, 55m left upper lobe nodule-no comparison available, new hypodense lesion in the left liver, solid Right renal mass  MRI 06/10/2016-2 enhancing lesions in the liver consistent with metastatic disease, segment 2 and segment 5. Solid enhancing right renal lesion consistent with a renal cell carcinoma  Radiofrequency ablation of 2 liver lesions on 07/25/2016  Cycle 1 adjuvant Xeloda 08/18/2016  Cycle 2 Xeloda 09/08/2016  CEA improved 09/26/2016  Cycle 3 Xeloda held 09/29/2016 due to unexplained abdominal pain,referred for CT  CT abdomen/pelvis 09/30/2016 with a long segment of bowel wall thickening involving the distal ileum; lesion left hepatic lobe similar to slightly smaller following ablation; lesion posterior right hepatic lobe elongated favored to represent treatment tract.  Cycle 3 Xeloda 10/10/2016 (dose reduced due to drug induced enteritis following cycle 2)discontinued 10/15/2016 secondary to abdominal pain.  CT in while the emergency room with abdominal pain 10/15/2016 revealed descending colitis  Colonoscopy 10/30/2016 confirmed a  mass at the: colo-colonicanastomosis  Cycle 4 Xeloda 11/03/2016  01/09/2017 status post a low anterior resection for removal of locally recurrent tumor at the sigmoid anastomosis, resection margins negative, tumor invades pericolonic soft tissue, 12 benign lymph nodes;MSI stable.  Foundation 1 testing- KRAS G13D, MSS, tumor mutation burden-3, BRAF wild-type  Cycle 5 Xeloda  02/12/2017  Cycle 6 Xeloda 03/12/2017, Xeloda dose reduced to 1000 mg twice daily on 03/18/2017  Cycle 7 Xeloda 04/09/2017  Cycle 8 Xeloda 05/12/2017  Restaging CTs 06/24/2017-no evidence of metastatic disease involving the chest. Post ablation changes in the liver. No new hepatic metastatic disease.  Restaging CTs 12/14/2017- stable liver ablation sites, indeterminate nodule adjacent to the descending colon, stable left renal mass, changes of early cirrhosis, enlarged porta hepatis node-likely reactive  MRI abdomen 04/22/2018- enlargement of 1.7 cm left paracolic gutter mass, stable 1 cm extrahepatic left liver capsule lesion, no evidence of local tumor recurrence at the ablation sites, no new liver lesions, stable right kidney mass  Colonoscopy 05/19/2018- mass in the rectum at the anastomotic site beginning at 3 cm from the anal verge, biopsy confirmed invasive adenocarcinoma  CT 06/07/2018-stable liver ablation sites, enlarging peritoneal lesion at the left pericolic gutter, stable nodule in the sigmoid mesocolon, stable prominent periportal lymph nodes, no evidence of metastatic disease to the chest  Radiation/Xeloda beginning 06/21/2018  Xeloda dose reduced to 1000 mg twice daily days of radiationbeginning8/16/2019 due to neutropenia  Radiation/Xeloda completed 08/02/2018  CTs 10/07/2018- new 6 mm right upper lobe nodule, stable ablation changes in the left and right liver, stable superior pole right renal mass, increased soft tissue thickening the presacral region, stable 12 mm porta hepatis node, increase in size of soft tissue nodule adjacent to the descending colon-2.1 x 2.2 cm  CTs 01/11/2019- anterior right upper lobe subpleural nodule-smaller, no suspicious lung nodules, stable ablation defects in the liver, stable right kidney mass, wall thickening at the suture line in the lower pelvis with extension to the presacral region/vaginal fornix, slight enlargement of left pericolic gutter  peritoneal implant  Sigmoidoscopy 01/27/2019- mass at 5 cm from the anal verge, biopsy confirmed adenocarcinoma  Laparoscopic transverse colostomy and Port-A-Cath placement 03/02/2019- tumor noted to be studding the descending colon   2.History ofMicrocytic anemia-likely iron deficiency anemia secondary to colon cancer, persistent microcytic anemia;ferrous sulfate increased to twice daily 12/15/2017;hemoglobin in normal range 03/16/2018, persistent microcytosis  3.Tubular villous adenoma on the sigmoid colon resection specimen 08/09/2015  Multiple tubular adenomas removed on a colonoscopy 11/16/2015  4. Right renal mass on CT 06/04/2016-suspicious for renal cell carcinoma;stable on CT 06/24/2017;referred to urology  Stable on CT 01/11/2019  5. History ofneutropenia secondary to chemotherapy  6.Pain secondary to local recurrence of colon cancer  7.Abdominal pain. CT abdomen/pelvis 07/24/2018- inflammatory changes and mild dilatation of multiple small bowel loops in the lower abdomen and pelvis. Unchanged metastatic implant in the left paracolic gutter; unchanged periportal lymphadenopathy; hepatic steatosis; stable liver ablation site; unchanged 2.7 cm enhancing right renal mass.Resolved.    Disposition: Ms. Sullivant has recovered from the colostomy procedure.  She has metastatic colon cancer.  We discussed treatment options.  Her daughter was present for the visit by telephone.  I recommend FOLFOX chemotherapy. We reviewed potential toxicities associated with the FOLFOX regimen including the chance for nausea/vomiting, mucositis, diarrhea, alopecia, and hematologic toxicity.  We discussed the rash, sun sensitivity, hand/foot syndrome, and hyperpigmentation seen with 5-fluorouracil.  We discussed the allergic reaction and various types of neuropathy associated with oxaliplatin.  She agrees to proceed.  The plan is to begin FOLFOX on 03/28/2019.  She has pain  secondary to local recurrence of colon cancer.  I prescribed oxycodone.  She will let us know if this does not help the pain.  The plan is to follow her clinical status, the CEA, and a repeat CT as markers of response to chemotherapy.  40 minutes were spent with the patient today.  The majority of the time was used for counseling and coordination of care.  [name], MD  03/22/2019  2:24 PM

## 2019-03-23 ENCOUNTER — Telehealth: Payer: Self-pay | Admitting: Oncology

## 2019-03-23 NOTE — Telephone Encounter (Signed)
Called patient to inform her that her treatment has been added. ° °Patient aware of appt date and time. °

## 2019-03-25 DIAGNOSIS — M810 Age-related osteoporosis without current pathological fracture: Secondary | ICD-10-CM | POA: Diagnosis not present

## 2019-03-25 DIAGNOSIS — D508 Other iron deficiency anemias: Secondary | ICD-10-CM | POA: Diagnosis not present

## 2019-03-25 DIAGNOSIS — Z483 Aftercare following surgery for neoplasm: Secondary | ICD-10-CM | POA: Diagnosis not present

## 2019-03-25 DIAGNOSIS — Z466 Encounter for fitting and adjustment of urinary device: Secondary | ICD-10-CM | POA: Diagnosis not present

## 2019-03-25 DIAGNOSIS — M1990 Unspecified osteoarthritis, unspecified site: Secondary | ICD-10-CM | POA: Diagnosis not present

## 2019-03-25 DIAGNOSIS — C189 Malignant neoplasm of colon, unspecified: Secondary | ICD-10-CM | POA: Diagnosis not present

## 2019-03-25 DIAGNOSIS — C787 Secondary malignant neoplasm of liver and intrahepatic bile duct: Secondary | ICD-10-CM | POA: Diagnosis not present

## 2019-03-25 DIAGNOSIS — Z433 Encounter for attention to colostomy: Secondary | ICD-10-CM | POA: Diagnosis not present

## 2019-03-25 DIAGNOSIS — Z85528 Personal history of other malignant neoplasm of kidney: Secondary | ICD-10-CM | POA: Diagnosis not present

## 2019-03-27 ENCOUNTER — Other Ambulatory Visit: Payer: Self-pay | Admitting: Oncology

## 2019-03-27 DIAGNOSIS — M810 Age-related osteoporosis without current pathological fracture: Secondary | ICD-10-CM | POA: Diagnosis not present

## 2019-03-27 DIAGNOSIS — Z483 Aftercare following surgery for neoplasm: Secondary | ICD-10-CM | POA: Diagnosis not present

## 2019-03-27 DIAGNOSIS — Z85528 Personal history of other malignant neoplasm of kidney: Secondary | ICD-10-CM | POA: Diagnosis not present

## 2019-03-27 DIAGNOSIS — C189 Malignant neoplasm of colon, unspecified: Secondary | ICD-10-CM | POA: Diagnosis not present

## 2019-03-27 DIAGNOSIS — Z466 Encounter for fitting and adjustment of urinary device: Secondary | ICD-10-CM | POA: Diagnosis not present

## 2019-03-27 DIAGNOSIS — Z433 Encounter for attention to colostomy: Secondary | ICD-10-CM | POA: Diagnosis not present

## 2019-03-27 DIAGNOSIS — M1990 Unspecified osteoarthritis, unspecified site: Secondary | ICD-10-CM | POA: Diagnosis not present

## 2019-03-27 DIAGNOSIS — C20 Malignant neoplasm of rectum: Secondary | ICD-10-CM

## 2019-03-27 DIAGNOSIS — C787 Secondary malignant neoplasm of liver and intrahepatic bile duct: Secondary | ICD-10-CM | POA: Diagnosis not present

## 2019-03-27 DIAGNOSIS — D508 Other iron deficiency anemias: Secondary | ICD-10-CM | POA: Diagnosis not present

## 2019-03-28 ENCOUNTER — Inpatient Hospital Stay: Payer: Medicare Other

## 2019-03-28 ENCOUNTER — Inpatient Hospital Stay (HOSPITAL_BASED_OUTPATIENT_CLINIC_OR_DEPARTMENT_OTHER): Payer: Medicare Other | Admitting: Nurse Practitioner

## 2019-03-28 ENCOUNTER — Other Ambulatory Visit: Payer: Self-pay | Admitting: Nurse Practitioner

## 2019-03-28 ENCOUNTER — Other Ambulatory Visit: Payer: Self-pay

## 2019-03-28 ENCOUNTER — Encounter: Payer: Self-pay | Admitting: Nurse Practitioner

## 2019-03-28 VITALS — BP 86/54 | HR 105 | Temp 98.5°F | Resp 18 | Ht 67.0 in | Wt 135.9 lb

## 2019-03-28 DIAGNOSIS — G893 Neoplasm related pain (acute) (chronic): Secondary | ICD-10-CM

## 2019-03-28 DIAGNOSIS — Z95828 Presence of other vascular implants and grafts: Secondary | ICD-10-CM

## 2019-03-28 DIAGNOSIS — C189 Malignant neoplasm of colon, unspecified: Secondary | ICD-10-CM

## 2019-03-28 DIAGNOSIS — Z933 Colostomy status: Secondary | ICD-10-CM

## 2019-03-28 DIAGNOSIS — R197 Diarrhea, unspecified: Secondary | ICD-10-CM | POA: Diagnosis not present

## 2019-03-28 DIAGNOSIS — N2889 Other specified disorders of kidney and ureter: Secondary | ICD-10-CM | POA: Diagnosis not present

## 2019-03-28 DIAGNOSIS — C187 Malignant neoplasm of sigmoid colon: Secondary | ICD-10-CM | POA: Diagnosis not present

## 2019-03-28 DIAGNOSIS — D509 Iron deficiency anemia, unspecified: Secondary | ICD-10-CM

## 2019-03-28 DIAGNOSIS — C787 Secondary malignant neoplasm of liver and intrahepatic bile duct: Secondary | ICD-10-CM

## 2019-03-28 DIAGNOSIS — Z5111 Encounter for antineoplastic chemotherapy: Secondary | ICD-10-CM | POA: Diagnosis not present

## 2019-03-28 DIAGNOSIS — D701 Agranulocytosis secondary to cancer chemotherapy: Secondary | ICD-10-CM | POA: Diagnosis not present

## 2019-03-28 DIAGNOSIS — C20 Malignant neoplasm of rectum: Secondary | ICD-10-CM

## 2019-03-28 LAB — CBC WITH DIFFERENTIAL (CANCER CENTER ONLY)
Abs Immature Granulocytes: 0.7 10*3/uL — ABNORMAL HIGH (ref 0.00–0.07)
Basophils Absolute: 0 10*3/uL (ref 0.0–0.1)
Basophils Relative: 0 %
Eosinophils Absolute: 0 10*3/uL (ref 0.0–0.5)
Eosinophils Relative: 0 %
HCT: 22.9 % — ABNORMAL LOW (ref 36.0–46.0)
Hemoglobin: 6.3 g/dL — CL (ref 12.0–15.0)
Immature Granulocytes: 4 %
Lymphocytes Relative: 3 %
Lymphs Abs: 0.5 10*3/uL — ABNORMAL LOW (ref 0.7–4.0)
MCH: 30.9 pg (ref 26.0–34.0)
MCHC: 27.5 g/dL — ABNORMAL LOW (ref 30.0–36.0)
MCV: 112.3 fL — ABNORMAL HIGH (ref 80.0–100.0)
Monocytes Absolute: 0.6 10*3/uL (ref 0.1–1.0)
Monocytes Relative: 3 %
Neutro Abs: 16.2 10*3/uL — ABNORMAL HIGH (ref 1.7–7.7)
Neutrophils Relative %: 90 %
Platelet Count: 549 10*3/uL — ABNORMAL HIGH (ref 150–400)
RBC: 2.04 MIL/uL — ABNORMAL LOW (ref 3.87–5.11)
WBC Count: 18 10*3/uL — ABNORMAL HIGH (ref 4.0–10.5)
nRBC: 1.1 % — ABNORMAL HIGH (ref 0.0–0.2)

## 2019-03-28 LAB — CMP (CANCER CENTER ONLY)
ALT: 9 U/L (ref 0–44)
AST: 19 U/L (ref 15–41)
Albumin: 2.7 g/dL — ABNORMAL LOW (ref 3.5–5.0)
Alkaline Phosphatase: 228 U/L — ABNORMAL HIGH (ref 38–126)
Anion gap: 15 (ref 5–15)
BUN: 21 mg/dL (ref 8–23)
CO2: 19 mmol/L — ABNORMAL LOW (ref 22–32)
Calcium: 8 mg/dL — ABNORMAL LOW (ref 8.9–10.3)
Chloride: 101 mmol/L (ref 98–111)
Creatinine: 0.92 mg/dL (ref 0.44–1.00)
GFR, Est AFR Am: >60 mL/min (ref >60)
GFR, Estimated: >60 mL/min (ref >60)
Glucose, Bld: 103 mg/dL — ABNORMAL HIGH (ref 70–99)
Potassium: 3.6 mmol/L (ref 3.5–5.1)
Sodium: 135 mmol/L (ref 135–145)
Total Bilirubin: 1.5 mg/dL — ABNORMAL HIGH (ref 0.3–1.2)
Total Protein: 6.4 g/dL — ABNORMAL LOW (ref 6.5–8.1)

## 2019-03-28 LAB — RETICULOCYTES
Immature Retic Fract: 46.3 % — ABNORMAL HIGH (ref 2.3–15.9)
RBC.: 2.06 MIL/uL — ABNORMAL LOW (ref 3.87–5.11)
Retic Count, Absolute: 448.5 10*3/uL — ABNORMAL HIGH (ref 19.0–186.0)
Retic Ct Pct: 21.8 % — ABNORMAL HIGH (ref 0.4–3.1)

## 2019-03-28 LAB — CEA (IN HOUSE-CHCC): CEA (CHCC-In House): 178.58 ng/mL — ABNORMAL HIGH (ref 0.00–5.00)

## 2019-03-28 LAB — PREPARE RBC (CROSSMATCH)

## 2019-03-28 LAB — LACTATE DEHYDROGENASE: LDH: 331 U/L — ABNORMAL HIGH (ref 98–192)

## 2019-03-28 LAB — ABO/RH: ABO/RH(D): O POS

## 2019-03-28 LAB — SAVE SMEAR(SSMR), FOR PROVIDER SLIDE REVIEW

## 2019-03-28 LAB — SAMPLE TO BLOOD BANK

## 2019-03-28 MED ORDER — SODIUM CHLORIDE 0.9% IV SOLUTION
250.0000 mL | Freq: Once | INTRAVENOUS | Status: AC
  Administered 2019-03-28: 13:00:00 250 mL via INTRAVENOUS
  Filled 2019-03-28: qty 250

## 2019-03-28 MED ORDER — OXYCODONE-ACETAMINOPHEN 5-325 MG PO TABS
ORAL_TABLET | ORAL | Status: AC
  Filled 2019-03-28: qty 2

## 2019-03-28 MED ORDER — SODIUM CHLORIDE 0.9% FLUSH
10.0000 mL | INTRAVENOUS | Status: AC | PRN
  Administered 2019-03-28: 17:00:00 10 mL
  Filled 2019-03-28: qty 10

## 2019-03-28 MED ORDER — HEPARIN SOD (PORK) LOCK FLUSH 100 UNIT/ML IV SOLN
500.0000 [IU] | Freq: Every day | INTRAVENOUS | Status: AC | PRN
  Administered 2019-03-28: 500 [IU]
  Filled 2019-03-28: qty 5

## 2019-03-28 MED ORDER — SODIUM CHLORIDE 0.9% FLUSH
10.0000 mL | INTRAVENOUS | Status: DC | PRN
  Administered 2019-03-28: 10 mL via INTRAVENOUS
  Filled 2019-03-28: qty 10

## 2019-03-28 NOTE — Progress Notes (Addendum)
[place] OFFICE PROGRESS NOTE   Diagnosis: Colon cancer  INTERVAL HISTORY:   Nichole Cross returns as scheduled.  She denies any bleeding.  No black stools.  She has dyspnea on exertion.  No fever or cough.  She continues to have pain at the rectum.  Pain medication is effective.  She had 2 episodes of nausea/vomiting on Saturday.  She "lost balance" and fell on Saturday.  She is lightheaded/dizzy with certain movements.  She denies diarrhea.  Objective:  Vital signs in last 24 hours:  Blood pressure (!) 86/54, pulse (!) 105, temperature 98.5 F (36.9 C), temperature source Oral, resp. rate 18, height _0  (1.702 m), weight 135 lb 14.4 oz (61.6 kg), SpO2 98 %.    HEENT: Conjunctival pallor. Resp: No respiratory distress. GI: Abdomen soft and nontender.  No hepatomegaly.  Light brown stool in colostomy collection bag. Vascular: No leg edema. Neuro: Alert and oriented. Skin: Superficial opening at the low portion of the midline wound.  No evidence of infection. Port-A-Cath without erythema.  Lab Results:  Lab Results  Component Value Date   WBC 18.0 (H) 03/28/2019   HGB 6.3 (LL) 03/28/2019   HCT 22.9 (L) 03/28/2019   MCV 112.3 (H) 03/28/2019   PLT 549 (H) 03/28/2019   NEUTROABS 16.2 (H) 03/28/2019   Blood smear: The platelets appear normal in number, numerous small platelet clumps.  The majority of the white cells are mature neutrophils.  Few band forms.  There are multiple 5 lobed and a few 6 lobed neutrophils.  No blasts or other young forms are present.  Few teardrops.  Few ovalocytes.  The polychromasia is markedly increased. Imaging:  No results found.  Medications: I have reviewed the patient's current medications.  Assessment/Plan: 1.  Adenocarcinoma the distal sigmoid colon, poorly differentiated, stage II (T3 N0), status post a laparoscopic assisted sigmoid colectomy 08/09/2015  No loss of mismatch repair protein expression  Elevated  preoperative CEA  Staging CT of the abdomen and pelvis 06/22/2015 with no evidence of distant metastatic disease  Mildly elevated CEA 02/23/2017And 04/17/2016  CTs chest, abdomen, and pelvis on 06/04/2016, 15m left upper lobe nodule-no comparison available, new hypodense lesion in the left liver, solid Right renal mass  MRI 06/10/2016-2 enhancing lesions in the liver consistent with metastatic disease, segment 2 and segment 5. Solid enhancing right renal lesion consistent with a renal cell carcinoma  Radiofrequency ablation of 2 liver lesions on 07/25/2016  Cycle 1 adjuvant Xeloda 08/18/2016  Cycle 2 Xeloda 09/08/2016  CEA improved 09/26/2016  Cycle 3 Xeloda held 09/29/2016 due to unexplained abdominal pain,referred for CT  CT abdomen/pelvis 09/30/2016 with a long segment of bowel wall thickening involving the distal ileum; lesion left hepatic lobe similar to slightly smaller following ablation; lesion posterior right hepatic lobe elongated favored to represent treatment tract.  Cycle 3 Xeloda 10/10/2016 (dose reduced due to drug induced enteritis following cycle 2)discontinued 10/15/2016 secondary to abdominal pain.  CT in while the emergency room with abdominal pain 10/15/2016 revealed descending colitis  Colonoscopy 10/30/2016 confirmed a mass at the: colo-colonicanastomosis  Cycle 4 Xeloda 11/03/2016  01/09/2017 status post a low anterior resection for removal of locally recurrent tumor at the sigmoid anastomosis, resection margins negative, tumor invades pericolonic soft tissue, 12 benign lymph nodes;MSI stable. Foundation 1 testing- KRAS G13D, MSS, tumor mutation burden-3,BRAFwild-type  Cycle 5 Xeloda 02/12/2017  Cycle 6 Xeloda 03/12/2017, Xeloda dose reduced to 1000 mg twice daily on 03/18/2017  Cycle 7 Xeloda 04/09/2017  Cycle 8 Xeloda 05/12/2017  Restaging CTs 06/24/2017-no evidence of metastatic disease involving the chest. Post ablation changes in the liver.  No new hepatic metastatic disease.  Restaging CTs 12/14/2017- stable liver ablation sites, indeterminate nodule adjacent to the descending colon, stable left renal mass, changes of early cirrhosis, enlarged porta hepatis node-likely reactive  MRI abdomen 04/22/2018- enlargement of 1.7 cm left paracolic gutter mass, stable 1 cm extrahepatic left liver capsule lesion, no evidence of local tumor recurrence at the ablation sites, no new liver lesions, stable right kidney mass  Colonoscopy 05/19/2018- mass in the rectum at the anastomotic site beginning at 3 cm from the anal verge, biopsy confirmed invasive adenocarcinoma  CT 06/07/2018-stable liver ablation sites, enlarging peritoneal lesion at the left pericolic gutter, stable nodule in the sigmoid mesocolon, stable prominent periportal lymph nodes, no evidence of metastatic disease to the chest  Radiation/Xeloda beginning 06/21/2018  Xeloda dose reduced to 1000 mg twice daily days of radiationbeginning8/16/2019 due to neutropenia  Radiation/Xeloda completed 08/02/2018  CTs 10/07/2018- new 6 mm right upper lobe nodule, stable ablation changes in the left and right liver, stable superior pole right renal mass, increased soft tissue thickening the presacral region, stable 12 mm porta hepatis node, increase in size of soft tissue nodule adjacent to the descending colon-2.1 x 2.2 cm  CTs 01/11/2019- anterior right upper lobe subpleural nodule-smaller, no suspicious lung nodules, stable ablation defects in the liver, stable right kidney mass, wall thickening at the suture line in the lower pelvis with extension to the presacral region/vaginal fornix, slight enlargement of left pericolic gutter peritoneal implant  Sigmoidoscopy 01/27/2019- mass at 5 cm from the anal verge, biopsy confirmed adenocarcinoma  Laparoscopic transverse colostomy and Port-A-Cath placement 03/02/2019- tumor noted to be studding the descending colon    2.History ofMicrocytic  anemia-likely iron deficiency anemia secondary to colon cancer, persistent microcytic anemia;ferrous sulfate increased to twice daily 12/15/2017;hemoglobin in normal range 03/16/2018, persistent microcytosis  3.Tubular villous adenoma on the sigmoid colon resection specimen 08/09/2015  Multiple tubular adenomas removed on a colonoscopy 11/16/2015  4. Right renal mass on CT 06/04/2016-suspicious for renal cell carcinoma;stable on CT 06/24/2017;referred to urology  Stable on CT 01/11/2019  5. History ofneutropenia secondary to chemotherapy  6.Pain secondary to local recurrence of colon cancer  7.Abdominal pain. CT abdomen/pelvis 07/24/2018- inflammatory changes and mild dilatation of multiple small bowel loops in the lower abdomen and pelvis. Unchanged metastatic implant in the left paracolic gutter; unchanged periportal lymphadenopathy; hepatic steatosis; stable liver ablation site; unchanged 2.7 cm enhancing right renal mass.Resolved.   Disposition: Nichole Cross has progressive symptomatic anemia.  We are holding the chemotherapy planned for today.  She will receive 2 units of blood.  We are obtaining additional labs including chemistry panel, LDH and DAT to evaluate for hemolysis.   She will return for repeat labs, follow-up and cycle 1 FOLFOX later this week.  She will contact the office in the interim with any problems.  Patient seen with Dr. Benay Spice.  25 minutes were spent face-to-face at today's visit with the majority of that time involved in counseling/coordination of care.    [name] ANP/GNP-BC   03/28/2019  12:05 PM  This was a shared visit with [name].  Ms. Iannacone has developed an acute drop in the hemoglobin with Red cell macrocytosis.  The etiology is unclear.  She denies bleeding.  She could have intramedullary hemolysis related to tumor in the marrow.  We will check a vitamin B12 level and Coombs test.  She will be transfused with packed red  blood cells today. Chemotherapy will be held today and she will return for an office visit on 03/30/2019.  Julieanne Manson, MD

## 2019-03-28 NOTE — Patient Instructions (Addendum)
Blood Transfusion, Adult, Care After This sheet gives you information about how to care for yourself after your procedure. Your doctor may also give you more specific instructions. If you have problems or questions, contact your doctor. Follow these instructions at home:   Take over-the-counter and prescription medicines only as told by your doctor.  Go back to your normal activities as told by your doctor.  Follow instructions from your doctor about how to take care of the area where an IV tube was put into your vein (insertion site). Make sure you: ? Wash your hands with soap and water before you change your bandage (dressing). If there is no soap and water, use hand sanitizer. ? Change your bandage as told by your doctor.  Check your IV insertion site every day for signs of infection. Check for: ? More redness, swelling, or pain. ? More fluid or blood. ? Warmth. ? Pus or a bad smell. Contact a doctor if:  You have more redness, swelling, or pain around the IV insertion site.  You have more fluid or blood coming from the IV insertion site.  Your IV insertion site feels warm to the touch.  You have pus or a bad smell coming from the IV insertion site.  Your pee (urine) turns pink, red, or brown.  You feel weak after doing your normal activities. Get help right away if:  You have signs of a serious allergic or body defense (immune) system reaction, including: ? Itchiness. ? Hives. ? Trouble breathing. ? Anxiety. ? Pain in your chest or lower back. ? Fever, flushing, and chills. ? Fast pulse. ? Rash. ? Watery poop (diarrhea). ? Throwing up (vomiting). ? Dark pee. ? Serious headache. ? Dizziness. ? Stiff neck. ? Yellow color in your face or the white parts of your eyes (jaundice). Summary  After a blood transfusion, return to your normal activities as told by your doctor.  Every day, check for signs of infection where the IV tube was put into your vein.  Some  signs of infection are warm skin, more redness and pain, more fluid or blood, and pus or a bad smell where the needle went in.  Contact your doctor if you feel weak or have any unusual symptoms. This information is not intended to replace advice given to you by your health care provider. Make sure you discuss any questions you have with your health care provider. Document Released: 11/24/2014 Document Revised: 06/27/2016 Document Reviewed: 06/27/2016 Elsevier Interactive Patient Education  2019 [place] (COVID-19) Are you at risk?  Are you at risk for the Coronavirus (COVID-19)?  To be considered HIGH RISK for Coronavirus (COVID-19), you have to meet the following criteria:  . Traveled to [place], [place], [place], [place] or [place]; or in the [place] to [place], [place], [place], or [place]; and have fever, cough, and shortness of breath within the last 2 weeks of travel OR . Been in close contact with a person diagnosed with COVID-19 within the last 2 weeks and have fever, cough, and shortness of breath . IF YOU DO NOT MEET THESE CRITERIA, YOU ARE CONSIDERED LOW RISK FOR COVID-19.  What to do if you are HIGH RISK for COVID-19?  Marland Kitchen If you are having a medical emergency, call 911. . Seek medical care right away. Before you go to a doctor's office, urgent care or emergency department, call ahead and tell them about your recent travel, contact with someone diagnosed with COVID-19,  and your symptoms. You should receive instructions from your physician's office regarding next steps of care.  . When you arrive at healthcare provider, tell the healthcare staff immediately you have returned from visiting [place], [place], [place], [place] or [place]; or traveled in the [place] to [place], [place], [place], or [place]; in the last two weeks or you have been in close contact with a person diagnosed with COVID-19 in the last 2 weeks.   . Tell the health care  staff about your symptoms: fever, cough and shortness of breath. . After you have been seen by a medical provider, you will be either: o Tested for (COVID-19) and discharged home on quarantine except to seek medical care if symptoms worsen, and asked to  - Stay home and avoid contact with others until you get your results (4-5 days)  - Avoid travel on public transportation if possible (such as bus, train, or airplane) or o Sent to the Emergency Department by EMS for evaluation, COVID-19 testing, and possible admission depending on your condition and test results.  What to do if you are LOW RISK for COVID-19?  Reduce your risk of any infection by using the same precautions used for avoiding the common cold or flu:  Marland Kitchen Wash your hands often with soap and warm water for at least 20 seconds.  If soap and water are not readily available, use an alcohol-based hand sanitizer with at least 60% alcohol.  . If coughing or sneezing, cover your mouth and nose by coughing or sneezing into the elbow areas of your shirt or coat, into a tissue or into your sleeve (not your hands). . Avoid shaking hands with others and consider head nods or verbal greetings only. . Avoid touching your eyes, nose, or mouth with unwashed hands.  . Avoid close contact with people who are sick. . Avoid places or events with large numbers of people in one location, like concerts or sporting events. . Carefully consider travel plans you have or are making. . If you are planning any travel outside or inside the [place], visit the CDC's Travelers' Health webpage for the latest health notices. . If you have some symptoms but not all symptoms, continue to monitor at home and seek medical attention if your symptoms worsen. . If you are having a medical emergency, call 911.   [place] / e-Visit: [email or web address]         [facility]:  [phone]  [facility]: [phone]     Oxaliplatin Injection What is this medicine? OXALIPLATIN (ox AL i PLA tin) is a chemotherapy drug. It targets fast dividing cells, like cancer cells, and causes these cells to die. This medicine is used to treat cancers of the colon and rectum, and many other cancers. This medicine may be used for other purposes; ask your health care provider or pharmacist if you have questions. COMMON BRAND NAME(S): Eloxatin What should I tell my health care provider before I take this medicine? They need to know if you have any of these conditions: -kidney disease -an unusual or allergic reaction to oxaliplatin, other chemotherapy, other medicines, foods, dyes, or preservatives -pregnant or trying to get pregnant -breast-feeding How should I use this medicine? This drug is given as an infusion into a vein. It is administered in a hospital or clinic by a specially trained health care professional. Talk to your pediatrician regarding the use of this medicine  in children. Special care may be needed. Overdosage: If you think you have taken too much of this medicine contact a poison control center or emergency room at once. NOTE: This medicine is only for you. Do not share this medicine with others. What if I miss a dose? It is important not to miss a dose. Call your doctor or health care professional if you are unable to keep an appointment. What may interact with this medicine? -medicines to increase blood counts like filgrastim, pegfilgrastim, sargramostim -probenecid -some antibiotics like amikacin, gentamicin, neomycin, polymyxin B, streptomycin, tobramycin -zalcitabine Talk to your doctor or health care professional before taking any of these medicines: -acetaminophen -aspirin -ibuprofen -ketoprofen -naproxen This list may not describe all possible interactions. Give your health care provider a list of all the medicines, herbs,  non-prescription drugs, or dietary supplements you use. Also tell them if you smoke, drink alcohol, or use illegal drugs. Some items may interact with your medicine. What should I watch for while using this medicine? Your condition will be monitored carefully while you are receiving this medicine. You will need important blood work done while you are taking this medicine. This medicine can make you more sensitive to cold. Do not drink cold drinks or use ice. Cover exposed skin before coming in contact with cold temperatures or cold objects. When out in cold weather wear warm clothing and cover your mouth and nose to warm the air that goes into your lungs. Tell your doctor if you get sensitive to the cold. This drug may make you feel generally unwell. This is not uncommon, as chemotherapy can affect healthy cells as well as cancer cells. Report any side effects. Continue your course of treatment even though you feel ill unless your doctor tells you to stop. In some cases, you may be given additional medicines to help with side effects. Follow all directions for their use. Call your doctor or health care professional for advice if you get a fever, chills or sore throat, or other symptoms of a cold or flu. Do not treat yourself. This drug decreases your body's ability to fight infections. Try to avoid being around people who are sick. This medicine may increase your risk to bruise or bleed. Call your doctor or health care professional if you notice any unusual bleeding. Be careful brushing and flossing your teeth or using a toothpick because you may get an infection or bleed more easily. If you have any dental work done, tell your dentist you are receiving this medicine. Avoid taking products that contain aspirin, acetaminophen, ibuprofen, naproxen, or ketoprofen unless instructed by your doctor. These medicines may hide a fever. Do not become pregnant while taking this medicine. Women should inform their  doctor if they wish to become pregnant or think they might be pregnant. There is a potential for serious side effects to an unborn child. Talk to your health care professional or pharmacist for more information. Do not breast-feed an infant while taking this medicine. Call your doctor or health care professional if you get diarrhea. Do not treat yourself. What side effects may I notice from receiving this medicine? Side effects that you should report to your doctor or health care professional as soon as possible: -allergic reactions like skin rash, itching or hives, swelling of the face, lips, or tongue -low blood counts - This drug may decrease the number of white blood cells, red blood cells and platelets. You may be at increased risk for infections and bleeding. -signs  of infection - fever or chills, cough, sore throat, pain or difficulty passing urine -signs of decreased platelets or bleeding - bruising, pinpoint red spots on the skin, black, tarry stools, nosebleeds -signs of decreased red blood cells - unusually weak or tired, fainting spells, lightheadedness -breathing problems -chest pain, pressure -cough -diarrhea -jaw tightness -mouth sores -nausea and vomiting -pain, swelling, redness or irritation at the injection site -pain, tingling, numbness in the hands or feet -problems with balance, talking, walking -redness, blistering, peeling or loosening of the skin, including inside the mouth -trouble passing urine or change in the amount of urine Side effects that usually do not require medical attention (report to your doctor or health care professional if they continue or are bothersome): -changes in vision -constipation -hair loss -loss of appetite -metallic taste in the mouth or changes in taste -stomach pain This list may not describe all possible side effects. Call your doctor for medical advice about side effects. You may report side effects to FDA at 1-800-FDA-1088. Where  should I keep my medicine? This drug is given in a hospital or clinic and will not be stored at home. NOTE: This sheet is a summary. It may not cover all possible information. If you have questions about this medicine, talk to your doctor, pharmacist, or health care provider.  2019 Elsevier/Gold Standard (2008-05-30 17:22:47)  Leucovorin injection What is this medicine? LEUCOVORIN (loo koe VOR in) is used to prevent or treat the harmful effects of some medicines. This medicine is used to treat anemia caused by a low amount of folic acid in the body. It is also used with 5-fluorouracil (5-FU) to treat colon cancer. This medicine may be used for other purposes; ask your health care provider or pharmacist if you have questions. What should I tell my health care provider before I take this medicine? They need to know if you have any of these conditions: -anemia from low levels of vitamin B-12 in the blood -an unusual or allergic reaction to leucovorin, folic acid, other medicines, foods, dyes, or preservatives -pregnant or trying to get pregnant -breast-feeding How should I use this medicine? This medicine is for injection into a muscle or into a vein. It is given by a health care professional in a hospital or clinic setting. Talk to your pediatrician regarding the use of this medicine in children. Special care may be needed. Overdosage: If you think you have taken too much of this medicine contact a poison control center or emergency room at once. NOTE: This medicine is only for you. Do not share this medicine with others. What if I miss a dose? This does not apply. What may interact with this medicine? -capecitabine -fluorouracil -phenobarbital -phenytoin -primidone -trimethoprim-sulfamethoxazole This list may not describe all possible interactions. Give your health care provider a list of all the medicines, herbs, non-prescription drugs, or dietary supplements you use. Also tell them if  you smoke, drink alcohol, or use illegal drugs. Some items may interact with your medicine. What should I watch for while using this medicine? Your condition will be monitored carefully while you are receiving this medicine. This medicine may increase the side effects of 5-fluorouracil, 5-FU. Tell your doctor or health care professional if you have diarrhea or mouth sores that do not get better or that get worse. What side effects may I notice from receiving this medicine? Side effects that you should report to your doctor or health care professional as soon as possible: -allergic reactions like skin rash,  itching or hives, swelling of the face, lips, or tongue -breathing problems -fever, infection -mouth sores -unusual bleeding or bruising -unusually weak or tired Side effects that usually do not require medical attention (report to your doctor or health care professional if they continue or are bothersome): -constipation or diarrhea -loss of appetite -nausea, vomiting This list may not describe all possible side effects. Call your doctor for medical advice about side effects. You may report side effects to FDA at 1-800-FDA-1088. Where should I keep my medicine? This drug is given in a hospital or clinic and will not be stored at home. NOTE: This sheet is a summary. It may not cover all possible information. If you have questions about this medicine, talk to your doctor, pharmacist, or health care provider.  2019 Elsevier/Gold Standard (2008-05-09 16:50:29)  Fluorouracil, 5-FU injection What is this medicine? FLUOROURACIL, 5-FU (flure oh YOOR a sil) is a chemotherapy drug. It slows the growth of cancer cells. This medicine is used to treat many types of cancer like breast cancer, colon or rectal cancer, pancreatic cancer, and stomach cancer. This medicine may be used for other purposes; ask your health care provider or pharmacist if you have questions. COMMON BRAND NAME(S): Adrucil What  should I tell my health care provider before I take this medicine? They need to know if you have any of these conditions: -blood disorders -dihydropyrimidine dehydrogenase (DPD) deficiency -infection (especially a virus infection such as chickenpox, cold sores, or herpes) -kidney disease -liver disease -malnourished, poor nutrition -recent or ongoing radiation therapy -an unusual or allergic reaction to fluorouracil, other chemotherapy, other medicines, foods, dyes, or preservatives -pregnant or trying to get pregnant -breast-feeding How should I use this medicine? This drug is given as an infusion or injection into a vein. It is administered in a hospital or clinic by a specially trained health care professional. Talk to your pediatrician regarding the use of this medicine in children. Special care may be needed. Overdosage: If you think you have taken too much of this medicine contact a poison control center or emergency room at once. NOTE: This medicine is only for you. Do not share this medicine with others. What if I miss a dose? It is important not to miss your dose. Call your doctor or health care professional if you are unable to keep an appointment. What may interact with this medicine? -allopurinol -cimetidine -dapsone -digoxin -hydroxyurea -leucovorin -levamisole -medicines for seizures like ethotoin, fosphenytoin, phenytoin -medicines to increase blood counts like filgrastim, pegfilgrastim, sargramostim -medicines that treat or prevent blood clots like warfarin, enoxaparin, and dalteparin -methotrexate -metronidazole -pyrimethamine -some other chemotherapy drugs like busulfan, cisplatin, estramustine, vinblastine -trimethoprim -trimetrexate -vaccines Talk to your doctor or health care professional before taking any of these medicines: -acetaminophen -aspirin -ibuprofen -ketoprofen -naproxen This list may not describe all possible interactions. Give your health  care provider a list of all the medicines, herbs, non-prescription drugs, or dietary supplements you use. Also tell them if you smoke, drink alcohol, or use illegal drugs. Some items may interact with your medicine. What should I watch for while using this medicine? Visit your doctor for checks on your progress. This drug may make you feel generally unwell. This is not uncommon, as chemotherapy can affect healthy cells as well as cancer cells. Report any side effects. Continue your course of treatment even though you feel ill unless your doctor tells you to stop. In some cases, you may be given additional medicines to help with side effects. Follow  all directions for their use. Call your doctor or health care professional for advice if you get a fever, chills or sore throat, or other symptoms of a cold or flu. Do not treat yourself. This drug decreases your body's ability to fight infections. Try to avoid being around people who are sick. This medicine may increase your risk to bruise or bleed. Call your doctor or health care professional if you notice any unusual bleeding. Be careful brushing and flossing your teeth or using a toothpick because you may get an infection or bleed more easily. If you have any dental work done, tell your dentist you are receiving this medicine. Avoid taking products that contain aspirin, acetaminophen, ibuprofen, naproxen, or ketoprofen unless instructed by your doctor. These medicines may hide a fever. Do not become pregnant while taking this medicine. Women should inform their doctor if they wish to become pregnant or think they might be pregnant. There is a potential for serious side effects to an unborn child. Talk to your health care professional or pharmacist for more information. Do not breast-feed an infant while taking this medicine. Men should inform their doctor if they wish to father a child. This medicine may lower sperm counts. Do not treat diarrhea with over  the counter products. Contact your doctor if you have diarrhea that lasts more than 2 days or if it is severe and watery. This medicine can make you more sensitive to the sun. Keep out of the sun. If you cannot avoid being in the sun, wear protective clothing and use sunscreen. Do not use sun lamps or tanning beds/booths. What side effects may I notice from receiving this medicine? Side effects that you should report to your doctor or health care professional as soon as possible: -allergic reactions like skin rash, itching or hives, swelling of the face, lips, or tongue -low blood counts - this medicine may decrease the number of white blood cells, red blood cells and platelets. You may be at increased risk for infections and bleeding. -signs of infection - fever or chills, cough, sore throat, pain or difficulty passing urine -signs of decreased platelets or bleeding - bruising, pinpoint red spots on the skin, black, tarry stools, blood in the urine -signs of decreased red blood cells - unusually weak or tired, fainting spells, lightheadedness -breathing problems -changes in vision -chest pain -mouth sores -nausea and vomiting -pain, swelling, redness at site where injected -pain, tingling, numbness in the hands or feet -redness, swelling, or sores on hands or feet -stomach pain -unusual bleeding Side effects that usually do not require medical attention (report to your doctor or health care professional if they continue or are bothersome): -changes in finger or toe nails -diarrhea -dry or itchy skin -hair loss -headache -loss of appetite -sensitivity of eyes to the light -stomach upset -unusually teary eyes This list may not describe all possible side effects. Call your doctor for medical advice about side effects. You may report side effects to FDA at 1-800-FDA-1088. Where should I keep my medicine? This drug is given in a hospital or clinic and will not be stored at home. NOTE:  This sheet is a summary. It may not cover all possible information. If you have questions about this medicine, talk to your doctor, pharmacist, or health care provider.  2019 Elsevier/Gold Standard (2008-03-08 13:53:16)                   Nichole Cross

## 2019-03-28 NOTE — Progress Notes (Signed)
Per Dr. Benay Spice, ok to use port a cath for blood transfusion without blood return noted at this time.

## 2019-03-29 ENCOUNTER — Telehealth: Payer: Self-pay | Admitting: Nurse Practitioner

## 2019-03-29 ENCOUNTER — Other Ambulatory Visit: Payer: Self-pay | Admitting: Nurse Practitioner

## 2019-03-29 ENCOUNTER — Telehealth: Payer: Self-pay

## 2019-03-29 DIAGNOSIS — Z5111 Encounter for antineoplastic chemotherapy: Secondary | ICD-10-CM | POA: Diagnosis not present

## 2019-03-29 DIAGNOSIS — D701 Agranulocytosis secondary to cancer chemotherapy: Secondary | ICD-10-CM | POA: Diagnosis not present

## 2019-03-29 DIAGNOSIS — C189 Malignant neoplasm of colon, unspecified: Secondary | ICD-10-CM

## 2019-03-29 DIAGNOSIS — Z933 Colostomy status: Secondary | ICD-10-CM | POA: Diagnosis not present

## 2019-03-29 DIAGNOSIS — N2889 Other specified disorders of kidney and ureter: Secondary | ICD-10-CM | POA: Diagnosis not present

## 2019-03-29 DIAGNOSIS — C187 Malignant neoplasm of sigmoid colon: Secondary | ICD-10-CM | POA: Diagnosis not present

## 2019-03-29 DIAGNOSIS — D509 Iron deficiency anemia, unspecified: Secondary | ICD-10-CM | POA: Diagnosis not present

## 2019-03-29 DIAGNOSIS — R197 Diarrhea, unspecified: Secondary | ICD-10-CM | POA: Diagnosis not present

## 2019-03-29 DIAGNOSIS — G893 Neoplasm related pain (acute) (chronic): Secondary | ICD-10-CM | POA: Diagnosis not present

## 2019-03-29 LAB — TYPE AND SCREEN
ABO/RH(D): O POS
Antibody Screen: NEGATIVE
Unit division: 0

## 2019-03-29 LAB — BPAM RBC
Blood Product Expiration Date: 202006032359
ISSUE DATE / TIME: 202005111414
Unit Type and Rh: 5100

## 2019-03-29 LAB — DIRECT ANTIGLOBULIN TEST (NOT AT ARMC)
DAT, IgG: POSITIVE
DAT, complement: POSITIVE

## 2019-03-29 LAB — VITAMIN B12: Vitamin B-12: 183 pg/mL (ref 180–914)

## 2019-03-29 MED ORDER — PREDNISONE 20 MG PO TABS: 60.0000 mg | ORAL_TABLET | Freq: Every day | ORAL | 0 refills | Status: DC

## 2019-03-29 NOTE — Telephone Encounter (Signed)
I spoke to Nichole Cross and her daughter Nichole Cross regarding the borderline low B12 level and positive DAT.  She will begin prednisone 60 mg daily.  A prescription was sent to her pharmacy.  B12 injections will be initiated when she is here tomorrow, 03/30/2019.

## 2019-03-29 NOTE — Telephone Encounter (Signed)
Scheduled appt per 5/11 los. °

## 2019-03-29 NOTE — Telephone Encounter (Signed)
TC per Lattie Haw to lab [phone]) to request adding B12 and DAT to labs from yesterday. Called lab and they confirmed that they can do it and that they sent them over to [facility].

## 2019-03-29 NOTE — Telephone Encounter (Signed)
Called patient to make her aware of change in appts and she now needs to come to cancer center tomorrow at 730 and she agreed.

## 2019-03-30 ENCOUNTER — Inpatient Hospital Stay: Payer: Medicare Other

## 2019-03-30 ENCOUNTER — Other Ambulatory Visit: Payer: Self-pay | Admitting: *Deleted

## 2019-03-30 ENCOUNTER — Other Ambulatory Visit: Payer: Self-pay

## 2019-03-30 ENCOUNTER — Telehealth: Payer: Self-pay | Admitting: Oncology

## 2019-03-30 ENCOUNTER — Inpatient Hospital Stay (HOSPITAL_BASED_OUTPATIENT_CLINIC_OR_DEPARTMENT_OTHER): Payer: Medicare Other | Admitting: Medical

## 2019-03-30 ENCOUNTER — Ambulatory Visit: Payer: Medicare Other

## 2019-03-30 ENCOUNTER — Inpatient Hospital Stay (HOSPITAL_BASED_OUTPATIENT_CLINIC_OR_DEPARTMENT_OTHER): Payer: Medicare Other | Admitting: Oncology

## 2019-03-30 VITALS — BP 89/64 | HR 105 | Temp 97.6°F | Resp 18 | Ht 67.0 in | Wt 137.0 lb

## 2019-03-30 DIAGNOSIS — C187 Malignant neoplasm of sigmoid colon: Secondary | ICD-10-CM

## 2019-03-30 DIAGNOSIS — G893 Neoplasm related pain (acute) (chronic): Secondary | ICD-10-CM

## 2019-03-30 DIAGNOSIS — Z95828 Presence of other vascular implants and grafts: Secondary | ICD-10-CM

## 2019-03-30 DIAGNOSIS — N2889 Other specified disorders of kidney and ureter: Secondary | ICD-10-CM | POA: Diagnosis not present

## 2019-03-30 DIAGNOSIS — Z933 Colostomy status: Secondary | ICD-10-CM | POA: Diagnosis not present

## 2019-03-30 DIAGNOSIS — R300 Dysuria: Secondary | ICD-10-CM

## 2019-03-30 DIAGNOSIS — D509 Iron deficiency anemia, unspecified: Secondary | ICD-10-CM | POA: Diagnosis not present

## 2019-03-30 DIAGNOSIS — C189 Malignant neoplasm of colon, unspecified: Secondary | ICD-10-CM

## 2019-03-30 DIAGNOSIS — C787 Secondary malignant neoplasm of liver and intrahepatic bile duct: Secondary | ICD-10-CM

## 2019-03-30 DIAGNOSIS — D701 Agranulocytosis secondary to cancer chemotherapy: Secondary | ICD-10-CM

## 2019-03-30 DIAGNOSIS — R197 Diarrhea, unspecified: Secondary | ICD-10-CM | POA: Diagnosis not present

## 2019-03-30 DIAGNOSIS — Z5111 Encounter for antineoplastic chemotherapy: Secondary | ICD-10-CM | POA: Diagnosis not present

## 2019-03-30 LAB — CBC WITH DIFFERENTIAL (CANCER CENTER ONLY)
Abs Immature Granulocytes: 0.32 10*3/uL — ABNORMAL HIGH (ref 0.00–0.07)
Basophils Absolute: 0 10*3/uL (ref 0.0–0.1)
Basophils Relative: 0 %
Eosinophils Absolute: 0 10*3/uL (ref 0.0–0.5)
Eosinophils Relative: 0 %
HCT: 29.4 % — ABNORMAL LOW (ref 36.0–46.0)
Hemoglobin: 8.5 g/dL — ABNORMAL LOW (ref 12.0–15.0)
Immature Granulocytes: 3 %
Lymphocytes Relative: 2 %
Lymphs Abs: 0.3 10*3/uL — ABNORMAL LOW (ref 0.7–4.0)
MCH: 31.6 pg (ref 26.0–34.0)
MCHC: 28.9 g/dL — ABNORMAL LOW (ref 30.0–36.0)
MCV: 109.3 fL — ABNORMAL HIGH (ref 80.0–100.0)
Monocytes Absolute: 0.3 10*3/uL (ref 0.1–1.0)
Monocytes Relative: 2 %
Neutro Abs: 9.8 10*3/uL — ABNORMAL HIGH (ref 1.7–7.7)
Neutrophils Relative %: 93 %
Platelet Count: 435 10*3/uL — ABNORMAL HIGH (ref 150–400)
RBC: 2.69 MIL/uL — ABNORMAL LOW (ref 3.87–5.11)
RDW: 25.6 % — ABNORMAL HIGH (ref 11.5–15.5)
WBC Count: 10.7 10*3/uL — ABNORMAL HIGH (ref 4.0–10.5)
nRBC: 0 % (ref 0.0–0.2)

## 2019-03-30 LAB — CMP (CANCER CENTER ONLY)
ALT: 8 U/L (ref 0–44)
AST: 16 U/L (ref 15–41)
Albumin: 2.7 g/dL — ABNORMAL LOW (ref 3.5–5.0)
Alkaline Phosphatase: 200 U/L — ABNORMAL HIGH (ref 38–126)
Anion gap: 14 (ref 5–15)
BUN: 21 mg/dL (ref 8–23)
CO2: 19 mmol/L — ABNORMAL LOW (ref 22–32)
Calcium: 8.1 mg/dL — ABNORMAL LOW (ref 8.9–10.3)
Chloride: 101 mmol/L (ref 98–111)
Creatinine: 0.85 mg/dL (ref 0.44–1.00)
GFR, Est AFR Am: >60 mL/min (ref >60)
GFR, Estimated: >60 mL/min (ref >60)
Glucose, Bld: 119 mg/dL — ABNORMAL HIGH (ref 70–99)
Potassium: 3.5 mmol/L (ref 3.5–5.1)
Sodium: 134 mmol/L — ABNORMAL LOW (ref 135–145)
Total Bilirubin: 0.9 mg/dL (ref 0.3–1.2)
Total Protein: 6.8 g/dL (ref 6.5–8.1)

## 2019-03-30 LAB — PROTIME-INR
INR: 1.1 (ref 0.8–1.2)
Prothrombin Time: 14.1 seconds (ref 11.4–15.2)

## 2019-03-30 LAB — URINALYSIS, COMPLETE (UACMP) WITH MICROSCOPIC
Bacteria, UA: NONE SEEN
Bilirubin Urine: NEGATIVE
Glucose, UA: NEGATIVE mg/dL
Ketones, ur: 5 mg/dL — AB
Nitrite: NEGATIVE
Protein, ur: 100 mg/dL — AB
Specific Gravity, Urine: 1.017 (ref 1.005–1.030)
pH: 7 (ref 5.0–8.0)

## 2019-03-30 LAB — LACTATE DEHYDROGENASE: LDH: 289 U/L — ABNORMAL HIGH (ref 98–192)

## 2019-03-30 LAB — BILIRUBIN, FRACTIONATED(TOT/DIR/INDIR)
Bilirubin, Direct: 0.3 mg/dL — ABNORMAL HIGH (ref 0.0–0.2)
Indirect Bilirubin: 0.5 mg/dL (ref 0.3–0.9)
Total Bilirubin: 0.8 mg/dL (ref 0.3–1.2)

## 2019-03-30 LAB — LACTIC ACID, PLASMA: Lactic Acid, Venous: 1.1 mmol/L (ref 0.5–1.9)

## 2019-03-30 LAB — SAMPLE TO BLOOD BANK

## 2019-03-30 LAB — APTT: aPTT: 35 seconds (ref 24–36)

## 2019-03-30 MED ORDER — SODIUM CHLORIDE 0.9 % IV SOLN
INTRAVENOUS | Status: DC
  Administered 2019-03-30: 12:00:00 via INTRAVENOUS
  Filled 2019-03-30 (×2): qty 250

## 2019-03-30 MED ORDER — DIPHENOXYLATE-ATROPINE 2.5-0.025 MG PO TABS: 1.0000 | ORAL_TABLET | Freq: Four times a day (QID) | ORAL | 0 refills | Status: DC | PRN

## 2019-03-30 MED ORDER — ALTEPLASE 2 MG IJ SOLR
2.0000 mg | Freq: Once | INTRAMUSCULAR | Status: AC
  Administered 2019-03-30: 2 mg
  Filled 2019-03-30: qty 2

## 2019-03-30 MED ORDER — CYANOCOBALAMIN 1000 MCG/ML IJ SOLN
INTRAMUSCULAR | Status: AC
  Filled 2019-03-30: qty 1

## 2019-03-30 MED ORDER — OXYCODONE-ACETAMINOPHEN 5-325 MG PO TABS
1.0000 | ORAL_TABLET | Freq: Once | ORAL | Status: AC
  Administered 2019-03-30: 12:00:00 1 via ORAL

## 2019-03-30 MED ORDER — ALTEPLASE 2 MG IJ SOLR
INTRAMUSCULAR | Status: AC
  Filled 2019-03-30: qty 2

## 2019-03-30 MED ORDER — SODIUM CHLORIDE 0.9% FLUSH
10.0000 mL | INTRAVENOUS | Status: DC | PRN
  Administered 2019-03-30: 15:00:00 10 mL via INTRAVENOUS
  Filled 2019-03-30: qty 10

## 2019-03-30 MED ORDER — OXYCODONE-ACETAMINOPHEN 5-325 MG PO TABS
ORAL_TABLET | ORAL | Status: AC
  Filled 2019-03-30: qty 1

## 2019-03-30 MED ORDER — CYANOCOBALAMIN 1000 MCG/ML IJ SOLN
1000.0000 ug | Freq: Once | INTRAMUSCULAR | Status: AC
  Administered 2019-03-30: 14:00:00 1000 ug via SUBCUTANEOUS

## 2019-03-30 MED ORDER — PROCHLORPERAZINE MALEATE 10 MG PO TABS: 10.0000 mg | ORAL_TABLET | Freq: Four times a day (QID) | ORAL | 0 refills | Status: DC | PRN

## 2019-03-30 MED ORDER — OXYCODONE-ACETAMINOPHEN 5-325 MG PO TABS: 1.0000 | ORAL_TABLET | ORAL | 0 refills | Status: DC | PRN

## 2019-03-30 MED ORDER — HEPARIN SOD (PORK) LOCK FLUSH 100 UNIT/ML IV SOLN
500.0000 [IU] | Freq: Once | INTRAVENOUS | Status: AC
  Administered 2019-03-30: 15:00:00 500 [IU] via INTRAVENOUS
  Filled 2019-03-30: qty 5

## 2019-03-30 MED ORDER — VITAMIN B-12 1000 MCG PO TABS: 1000.0000 ug | ORAL_TABLET | Freq: Every day | ORAL | Status: DC

## 2019-03-30 MED ORDER — DIPHENOXYLATE-ATROPINE 2.5-0.025 MG PO TABS
1.0000 | ORAL_TABLET | Freq: Once | ORAL | Status: AC
  Administered 2019-03-30: 1 via ORAL

## 2019-03-30 MED ORDER — DIPHENOXYLATE-ATROPINE 2.5-0.025 MG PO TABS
ORAL_TABLET | ORAL | Status: AC
  Filled 2019-03-30: qty 1

## 2019-03-30 NOTE — Progress Notes (Signed)
This patient was erroneously placed on my schedule.  She had been seen by Dr. Benay Spice today.

## 2019-03-30 NOTE — Progress Notes (Signed)
@   1056 Verbal order from MD to add cathflow to port due to lack of blood return despite various maneuvers and flushing. @1130  Still no blood return from port. Per MD, OK to use port for IV fluids today. Was able to aspirate 1 cc of cathflow prior to connecting IV fluids. @ 1150 patient reports pain. OK to give Percocet #1 tablet today as well as Lomotil for liquid stool from ostomy (300cc). Also ordered B12 injection today.

## 2019-03-30 NOTE — Patient Instructions (Addendum)
Rehydration, Adult Rehydration is the replacement of body fluids and salts and minerals (electrolytes) that are lost during dehydration. Dehydration is when there is not enough fluid or water in the body. This happens when you lose more fluids than you take in. Common causes of dehydration include:  Vomiting.  Diarrhea.  Excessive sweating, such as from heat exposure or exercise.  Taking medicines that cause the body to lose excess fluid (diuretics).  Impaired kidney function.  Not drinking enough fluid.  Certain illnesses or infections.  Certain poorly controlled long-term (chronic) illnesses, such as diabetes, heart disease, and kidney disease.  Symptoms of mild dehydration may include thirst, dry lips and mouth, dry skin, and dizziness. Symptoms of severe dehydration may include increased heart rate, confusion, fainting, and not urinating. You can rehydrate by drinking certain fluids or getting fluids through an IV tube, as told by your health care provider. What are the risks? Generally, rehydration is safe. However, one problem that can happen is taking in too much fluid (overhydration). This is rare. If overhydration happens, it can cause an electrolyte imbalance, kidney failure, or a decrease in salt (sodium) levels in the body. How to rehydrate Follow instructions from your health care provider for rehydration. The kind of fluid you should drink and the amount you should drink depend on your condition.  If directed by your health care provider, drink an oral rehydration solution (ORS). This is a drink designed to treat dehydration that is found in pharmacies and retail stores. ? Make an ORS by following instructions on the package. ? Start by drinking small amounts, about  cup (120 mL) every 5-10 minutes. ? Slowly increase how much you drink until you have taken the amount recommended by your health care provider.  Drink enough clear fluids to keep your urine clear or pale  yellow. If you were instructed to drink an ORS, finish the ORS first, then start slowly drinking other clear fluids. Drink fluids such as: ? Water. Do not drink only water. Doing that can lead to having too little sodium in your body (hyponatremia). ? Ice chips. ? Fruit juice that you have added water to (diluted juice). ? Low-calorie sports drinks.  If you are severely dehydrated, your health care provider may recommend that you receive fluids through an IV tube in the hospital.  Do not take sodium tablets. Doing that can lead to the condition of having too much sodium in your body (hypernatremia). Eating while you rehydrate Follow instructions from your health care provider about what to eat while you rehydrate. Your health care provider may recommend that you slowly begin eating regular foods in small amounts.  Eat foods that contain a healthy balance of electrolytes, such as bananas, oranges, potatoes, tomatoes, and spinach.  Avoid foods that are greasy or contain a lot of fat or sugar.  In some cases, you may get nutrition through a feeding tube that is passed through your nose and into your stomach (nasogastric tube, or NG tube). This may be done if you have uncontrolled vomiting or diarrhea. Beverages to avoid Certain beverages may make dehydration worse. While you rehydrate, avoid:  Alcohol.  Caffeine.  Drinks that contain a lot of sugar. These include: ? High-calorie sports drinks. ? Fruit juice that is not diluted. ? Soda.  Check nutrition labels to see how much sugar or caffeine a beverage contains. Signs of dehydration recovery You may be recovering from dehydration if:  You are urinating more often than before you started   rehydrating.  Your urine is clear or pale yellow.  Your energy level improves.  You vomit less frequently.  You have diarrhea less frequently.  Your appetite improves or returns to normal.  You feel less dizzy or less light-headed.  Your  skin tone and color start to look more normal. Contact a health care provider if:  You continue to have symptoms of mild dehydration, such as: ? Thirst. ? Dry lips. ? Slightly dry mouth. ? Dry, warm skin. ? Dizziness.  You continue to vomit or have diarrhea. Get help right away if:  You have symptoms of dehydration that get worse.  You feel: ? Confused. ? Weak. ? Like you are going to faint.  You have not urinated in 6-8 hours.  You have very dark urine.  You have trouble breathing.  Your heart rate while sitting still is over 100 beats a minute.  You cannot drink fluids without vomiting.  You have vomiting or diarrhea that: ? Gets worse. ? Does not go away.  You have a fever. This information is not intended to replace advice given to you by your health care provider. Make sure you discuss any questions you have with your health care provider. Document Released: 01/26/2012 Document Revised: 05/23/2016 Document Reviewed: 12/28/2015 Elsevier Interactive Patient Education  2019 [place], Pyridoxine, and Folate What is this medicine? A multivitamin containing folic acid, vitamin B6, and vitamin B12. This medicine may be used for other purposes; ask your health care provider or pharmacist if you have questions. COMMON BRAND NAME(S): AllanFol RX, AllanTex, Av-Vite FB, B Complex with Folic Acid, ComBgen, FaBB, Folamin, Folastin, [place], [place], [place], [place], [place], [employer], [place] RX 2.2, [place], [place] 2.2, Foltabs 800, Foltx, Homocysteine Formula, Niva-Fol, NuFol, [employer], Virt-Gard, Virt-Vite, Virt-Vite [place], Vita-Respa What should I tell my health care provider before I take this medicine? They need to know if you have any of these conditions: -bleeding or clotting disorder -history of anemia of any type -other chronic health condition -an unusual or allergic reaction to vitamins, other medicines, foods, dyes, or preservatives  -pregnant or trying to get pregnant -breast-feeding How should I use this medicine? Take by mouth with a glass of water. May take with food. Follow the directions on the prescription label. It is usually given once a day. Do not take your medicine more often than directed. Contact your pediatrician regarding the use of this medicine in children. Special care may be needed. Overdosage: If you think you have taken too much of this medicine contact a poison control center or emergency room at once. NOTE: This medicine is only for you. Do not share this medicine with others. What if I miss a dose? If you miss a dose, take it as soon as you can. If it is almost time for your next dose, take only that dose. Do not take double or extra doses. What may interact with this medicine? -levodopa This list may not describe all possible interactions. Give your health care provider a list of all the medicines, herbs, non-prescription drugs, or dietary supplements you use. Also tell them if you smoke, drink alcohol, or use illegal drugs. Some items may interact with your medicine. What should I watch for while using this medicine? See your health care professional for regular checks on your progress. Remember that vitamin supplements do not replace the need for good nutrition from a balanced diet. What side effects may I notice from receiving this medicine? Side effects that  you should report to your doctor or health care professional as soon as possible: -allergic reaction such as skin rash or difficulty breathing -vomiting Side effects that usually do not require medical attention (report to your doctor or health care professional if they continue or are bothersome): -nausea -stomach upset This list may not describe all possible side effects. Call your doctor for medical advice about side effects. You may report side effects to FDA at 1-800-FDA-1088. Where should I keep my medicine? Keep out of the reach of  children. Most vitamins should be stored at controlled room temperature. Check your specific product directions. Protect from heat and moisture. Throw away any unused medicine after the expiration date. NOTE: This sheet is a summary. It may not cover all possible information. If you have questions about this medicine, talk to your doctor, pharmacist, or health care provider.  2019 Elsevier/Gold Standard (2007-12-25 00:59:55)

## 2019-03-30 NOTE — Progress Notes (Signed)
[facility] OFFICE PROGRESS NOTE   Diagnosis: Colon cancer  INTERVAL HISTORY:   Nichole Cross returns as scheduled.  When she was here on 01/26/2019 she had severe anemia.  The DAT returned positive.  The LDH and bilirubin were mildly elevated and she was mounting a marked reticulocytosis.  She was started prednisone yesterday. She reports liquid output from the ostomy bag.  No pain.  She continues to have rectal pain.  No fever.  No other complaint.  Objective:  Vital signs in last 24 hours:  Blood pressure (!) 89/64, pulse (!) 105, temperature 97.6 F (36.4 C), temperature source Oral, resp. rate 18, height 5' 7" (1.702 m), weight 137 lb (62.1 kg), SpO2 100 %. Repeat blood pressure 100/61, pulse 70 at 2:38 PM  Resp: Clear bilaterally Cardio: Regular rate and rhythm GI: Soft, no hepatomegaly, brown liquid stool in the ostomy bag Vascular: No leg edema    Portacath/PICC-without erythema  Lab Results:  Lab Results  Component Value Date   WBC 10.7 (H) 03/30/2019   HGB 8.5 (L) 03/30/2019   HCT 29.4 (L) 03/30/2019   MCV 109.3 (H) 03/30/2019   PLT 435 (H) 03/30/2019   NEUTROABS 9.8 (H) 03/30/2019    CMP  Lab Results  Component Value Date   NA 134 (L) 03/30/2019   K 3.5 03/30/2019   CL 101 03/30/2019   CO2 19 (L) 03/30/2019   GLUCOSE 119 (H) 03/30/2019   BUN 21 03/30/2019   CREATININE 0.85 03/30/2019   CALCIUM 8.1 (L) 03/30/2019   PROT 6.8 03/30/2019   ALBUMIN 2.7 (L) 03/30/2019   AST 16 03/30/2019   ALT 8 03/30/2019   ALKPHOS 200 (H) 03/30/2019   BILITOT 0.9 03/30/2019   GFRNONAA >60 03/30/2019   GFRAA >60 03/30/2019  LDH 289, lactic acid 1.1 Vitamin B12 on 03/29/2019: 183 Lab Results  Component Value Date   CEA1 178.58 (H) 03/28/2019    Lab Results  Component Value Date   INR 1.1 03/30/2019   Medications: I have reviewed the patient's current medications.   Assessment/Plan: 1.  Adenocarcinoma the distal sigmoid colon, poorly  differentiated, stage II (T3 N0), status post a laparoscopic assisted sigmoid colectomy 08/09/2015  No loss of mismatch repair protein expression  Elevated preoperative CEA  Staging CT of the abdomen and pelvis 06/22/2015 with no evidence of distant metastatic disease  Mildly elevated CEA 02/23/2017And 04/17/2016  CTs chest, abdomen, and pelvis on 06/04/2016, 40m left upper lobe nodule-no comparison available, new hypodense lesion in the left liver, solid Right renal mass  MRI 06/10/2016-2 enhancing lesions in the liver consistent with metastatic disease, segment 2 and segment 5. Solid enhancing right renal lesion consistent with a renal cell carcinoma  Radiofrequency ablation of 2 liver lesions on 07/25/2016  Cycle 1 adjuvant Xeloda 08/18/2016  Cycle 2 Xeloda 09/08/2016  CEA improved 09/26/2016  Cycle 3 Xeloda held 09/29/2016 due to unexplained abdominal pain,referred for CT  CT abdomen/pelvis 09/30/2016 with a long segment of bowel wall thickening involving the distal ileum; lesion left hepatic lobe similar to slightly smaller following ablation; lesion posterior right hepatic lobe elongated favored to represent treatment tract.  Cycle 3 Xeloda 10/10/2016 (dose reduced due to drug induced enteritis following cycle 2)discontinued 10/15/2016 secondary to abdominal pain.  CT in while the emergency room with abdominal pain 10/15/2016 revealed descending colitis  Colonoscopy 10/30/2016 confirmed a mass at the: colo-colonicanastomosis  Cycle 4 Xeloda 11/03/2016  01/09/2017 status post a low anterior resection for removal of locally  recurrent tumor at the sigmoid anastomosis, resection margins negative, tumor invades pericolonic soft tissue, 12 benign lymph nodes;MSI stable. Foundation 1 testing- KRAS G13D, MSS, tumor mutation burden-3,BRAFwild-type  Cycle 5 Xeloda 02/12/2017  Cycle 6 Xeloda 03/12/2017, Xeloda dose reduced to 1000 mg twice daily on 03/18/2017  Cycle 7  Xeloda 04/09/2017  Cycle 8 Xeloda 05/12/2017  Restaging CTs 06/24/2017-no evidence of metastatic disease involving the chest. Post ablation changes in the liver. No new hepatic metastatic disease.  Restaging CTs 12/14/2017- stable liver ablation sites, indeterminate nodule adjacent to the descending colon, stable left renal mass, changes of early cirrhosis, enlarged porta hepatis node-likely reactive  MRI abdomen 04/22/2018- enlargement of 1.7 cm left paracolic gutter mass, stable 1 cm extrahepatic left liver capsule lesion, no evidence of local tumor recurrence at the ablation sites, no new liver lesions, stable right kidney mass  Colonoscopy 05/19/2018- mass in the rectum at the anastomotic site beginning at 3 cm from the anal verge, biopsy confirmed invasive adenocarcinoma  CT 06/07/2018-stable liver ablation sites, enlarging peritoneal lesion at the left pericolic gutter, stable nodule in the sigmoid mesocolon, stable prominent periportal lymph nodes, no evidence of metastatic disease to the chest  Radiation/Xeloda beginning 06/21/2018  Xeloda dose reduced to 1000 mg twice daily days of radiationbeginning8/16/2019 due to neutropenia  Radiation/Xeloda completed 08/02/2018  CTs 10/07/2018- new 6 mm right upper lobe nodule, stable ablation changes in the left and right liver, stable superior pole right renal mass, increased soft tissue thickening the presacral region, stable 12 mm porta hepatis node, increase in size of soft tissue nodule adjacent to the descending colon-2.1 x 2.2 cm  CTs 01/11/2019- anterior right upper lobe subpleural nodule-smaller, no suspicious lung nodules, stable ablation defects in the liver, stable right kidney mass, wall thickening at the suture line in the lower pelvis with extension to the presacral region/vaginal fornix, slight enlargement of left pericolic gutter peritoneal implant  Sigmoidoscopy 01/27/2019- mass at 5 cm from the anal verge, biopsy confirmed  adenocarcinoma  Laparoscopic transverse colostomy and Port-A-Cath placement 03/02/2019- tumor noted to be studding the descending colon    2.History ofMicrocytic anemia-likely iron deficiency anemia secondary to colon cancer, persistent microcytic anemia;ferrous sulfate increased to twice daily 12/15/2017;hemoglobin in normal range 03/16/2018, persistent microcytosis  3.Tubular villous adenoma on the sigmoid colon resection specimen 08/09/2015  Multiple tubular adenomas removed on a colonoscopy 11/16/2015  4. Right renal mass on CT 06/04/2016-suspicious for renal cell carcinoma;stable on CT 06/24/2017;referred to urology  Stable on CT 01/11/2019  5. History ofneutropenia secondary to chemotherapy  6.Pain secondary to local recurrence of colon cancer  7.Abdominal pain. CT abdomen/pelvis 07/24/2018- inflammatory changes and mild dilatation of multiple small bowel loops in the lower abdomen and pelvis. Unchanged metastatic implant in the left paracolic gutter; unchanged periportal lymphadenopathy; hepatic steatosis; stable liver ablation site; unchanged 2.7 cm enhancing right renal mass.Resolved.  8.  Severe macrocytic anemia 03/28/2019- vitamin B12 level at the low end of normal, hypersegmented neutrophils noted on peripheral blood smear, DAT positive, elevated LDH and bilirubin  Prednisone, 60 mg daily started 03/29/2019  1 unit of packed red blood cells 03/28/2019     Disposition: Nichole Cross has metastatic colon cancer.  She presented on 03/28/2019 with severe anemia.  She was scheduled to begin chemotherapy.  Chemotherapy was held and she was transfused with packed red blood cells. The etiology of the anemia remains unclear, but I suspect a component from chronic disease, hemolysis, and B12 deficiency.  She has started prednisone and was  transfused with packed red blood cells.  She began vitamin B12 therapy today. She presented today with hypotension and  tachycardia.  Her vital signs improved after intravenous hydration.  She has a large volume of liquid stool in the colostomy bag.  She will begin Imodium.  Chemotherapy was held again today.  She will return for reassessment with the plan to begin FOLFOX chemotherapy tomorrow.  We obtained urine and blood cultures today.  I have a low clinical suspicion for sepsis.  I refilled her prescription for Percocet.  [name], MD  03/30/2019  2:56 PM

## 2019-03-30 NOTE — Progress Notes (Signed)
Pt. In Symptom management to receive fluids today  IV fluids given Per Dr. Benay Spice, Pt states she has pain in the rectum requested pain medication Pt's pain 8/10, pain medication given, pain reassessed Pt. Stated she had no pain. Pt. tolerated fluids well, Pt. Preferred to be left accessed returning for treatment tomorrow.No further problems or concerns noted.

## 2019-03-30 NOTE — Telephone Encounter (Signed)
Per 5/13 los, appt already scheduled.

## 2019-03-31 ENCOUNTER — Inpatient Hospital Stay: Payer: Medicare Other

## 2019-03-31 ENCOUNTER — Encounter: Payer: Self-pay | Admitting: Nurse Practitioner

## 2019-03-31 ENCOUNTER — Other Ambulatory Visit: Payer: Self-pay

## 2019-03-31 ENCOUNTER — Inpatient Hospital Stay (HOSPITAL_BASED_OUTPATIENT_CLINIC_OR_DEPARTMENT_OTHER): Payer: Medicare Other | Admitting: Nurse Practitioner

## 2019-03-31 ENCOUNTER — Other Ambulatory Visit: Payer: Self-pay | Admitting: Nurse Practitioner

## 2019-03-31 ENCOUNTER — Telehealth: Payer: Self-pay

## 2019-03-31 ENCOUNTER — Telehealth: Payer: Self-pay | Admitting: Nurse Practitioner

## 2019-03-31 VITALS — BP 94/62 | HR 99 | Temp 97.2°F | Resp 18 | Ht 67.0 in | Wt 138.6 lb

## 2019-03-31 DIAGNOSIS — E860 Dehydration: Secondary | ICD-10-CM

## 2019-03-31 DIAGNOSIS — C187 Malignant neoplasm of sigmoid colon: Secondary | ICD-10-CM | POA: Diagnosis not present

## 2019-03-31 DIAGNOSIS — C189 Malignant neoplasm of colon, unspecified: Secondary | ICD-10-CM

## 2019-03-31 DIAGNOSIS — D509 Iron deficiency anemia, unspecified: Secondary | ICD-10-CM | POA: Diagnosis not present

## 2019-03-31 DIAGNOSIS — D701 Agranulocytosis secondary to cancer chemotherapy: Secondary | ICD-10-CM | POA: Diagnosis not present

## 2019-03-31 DIAGNOSIS — Z95828 Presence of other vascular implants and grafts: Secondary | ICD-10-CM

## 2019-03-31 DIAGNOSIS — N2889 Other specified disorders of kidney and ureter: Secondary | ICD-10-CM

## 2019-03-31 DIAGNOSIS — C787 Secondary malignant neoplasm of liver and intrahepatic bile duct: Secondary | ICD-10-CM

## 2019-03-31 DIAGNOSIS — Z5111 Encounter for antineoplastic chemotherapy: Secondary | ICD-10-CM | POA: Diagnosis not present

## 2019-03-31 DIAGNOSIS — Z933 Colostomy status: Secondary | ICD-10-CM | POA: Diagnosis not present

## 2019-03-31 DIAGNOSIS — G893 Neoplasm related pain (acute) (chronic): Secondary | ICD-10-CM | POA: Diagnosis not present

## 2019-03-31 DIAGNOSIS — R197 Diarrhea, unspecified: Secondary | ICD-10-CM | POA: Diagnosis not present

## 2019-03-31 LAB — CBC WITH DIFFERENTIAL (CANCER CENTER ONLY)
Abs Immature Granulocytes: 0.49 10*3/uL — ABNORMAL HIGH (ref 0.00–0.07)
Basophils Absolute: 0 10*3/uL (ref 0.0–0.1)
Basophils Relative: 0 %
Eosinophils Absolute: 0 10*3/uL (ref 0.0–0.5)
Eosinophils Relative: 0 %
HCT: 28.4 % — ABNORMAL LOW (ref 36.0–46.0)
Hemoglobin: 8.4 g/dL — ABNORMAL LOW (ref 12.0–15.0)
Immature Granulocytes: 4 %
Lymphocytes Relative: 6 %
Lymphs Abs: 0.7 10*3/uL (ref 0.7–4.0)
MCH: 32.4 pg (ref 26.0–34.0)
MCHC: 29.6 g/dL — ABNORMAL LOW (ref 30.0–36.0)
MCV: 109.7 fL — ABNORMAL HIGH (ref 80.0–100.0)
Monocytes Absolute: 0.6 10*3/uL (ref 0.1–1.0)
Monocytes Relative: 5 %
Neutro Abs: 10.1 10*3/uL — ABNORMAL HIGH (ref 1.7–7.7)
Neutrophils Relative %: 85 %
Platelet Count: 546 10*3/uL — ABNORMAL HIGH (ref 150–400)
RBC: 2.59 MIL/uL — ABNORMAL LOW (ref 3.87–5.11)
RDW: 23.8 % — ABNORMAL HIGH (ref 11.5–15.5)
WBC Count: 11.9 10*3/uL — ABNORMAL HIGH (ref 4.0–10.5)
nRBC: 0 % (ref 0.0–0.2)

## 2019-03-31 LAB — CMP (CANCER CENTER ONLY)
ALT: 8 U/L (ref 0–44)
AST: 15 U/L (ref 15–41)
Albumin: 2.5 g/dL — ABNORMAL LOW (ref 3.5–5.0)
Alkaline Phosphatase: 193 U/L — ABNORMAL HIGH (ref 38–126)
Anion gap: 11 (ref 5–15)
BUN: 24 mg/dL — ABNORMAL HIGH (ref 8–23)
CO2: 19 mmol/L — ABNORMAL LOW (ref 22–32)
Calcium: 7.9 mg/dL — ABNORMAL LOW (ref 8.9–10.3)
Chloride: 102 mmol/L (ref 98–111)
Creatinine: 0.81 mg/dL (ref 0.44–1.00)
GFR, Est AFR Am: >60 mL/min (ref >60)
GFR, Estimated: >60 mL/min (ref >60)
Glucose, Bld: 100 mg/dL — ABNORMAL HIGH (ref 70–99)
Potassium: 3.2 mmol/L — ABNORMAL LOW (ref 3.5–5.1)
Sodium: 132 mmol/L — ABNORMAL LOW (ref 135–145)
Total Bilirubin: 0.7 mg/dL (ref 0.3–1.2)
Total Protein: 6.1 g/dL — ABNORMAL LOW (ref 6.5–8.1)

## 2019-03-31 LAB — LACTATE DEHYDROGENASE: LDH: 258 U/L — ABNORMAL HIGH (ref 98–192)

## 2019-03-31 LAB — C DIFFICILE QUICK SCREEN W PCR REFLEX
C Diff antigen: NEGATIVE
C Diff interpretation: NOT DETECTED
C Diff toxin: NEGATIVE

## 2019-03-31 MED ORDER — HEPARIN SOD (PORK) LOCK FLUSH 100 UNIT/ML IV SOLN
500.0000 [IU] | Freq: Once | INTRAVENOUS | Status: AC
  Administered 2019-03-31: 500 [IU] via INTRAVENOUS
  Filled 2019-03-31: qty 5

## 2019-03-31 MED ORDER — SODIUM CHLORIDE 0.9 % IV SOLN
INTRAVENOUS | Status: DC
  Administered 2019-03-31: 10:00:00 via INTRAVENOUS
  Filled 2019-03-31 (×2): qty 250

## 2019-03-31 MED ORDER — SODIUM CHLORIDE 0.9% FLUSH
10.0000 mL | INTRAVENOUS | Status: DC | PRN
  Filled 2019-03-31: qty 10

## 2019-03-31 MED ORDER — CIPROFLOXACIN HCL 500 MG PO TABS: 500.0000 mg | ORAL_TABLET | Freq: Two times a day (BID) | ORAL | 0 refills | Status: AC

## 2019-03-31 MED ORDER — DIPHENOXYLATE-ATROPINE 2.5-0.025 MG PO TABS: 1.0000 | ORAL_TABLET | Freq: Four times a day (QID) | ORAL | 0 refills | Status: DC | PRN

## 2019-03-31 MED ORDER — SODIUM CHLORIDE 0.9% FLUSH
10.0000 mL | INTRAVENOUS | Status: DC | PRN
  Administered 2019-03-31 (×2): 10 mL via INTRAVENOUS
  Filled 2019-03-31: qty 10

## 2019-03-31 NOTE — Telephone Encounter (Signed)
TC per Lattie Haw to pt to let her know that her stool was negative for C. Difficile and that she should begin Lomotil/imodium as they previously discussed. Pt verbalized understanding. No further problems or concerns at this time.

## 2019-03-31 NOTE — Progress Notes (Signed)
Patient not receiving chemotherapy today. One liter of fluids given instead. Chemotherapy to be rescheduled.

## 2019-03-31 NOTE — Progress Notes (Addendum)
[facility] OFFICE PROGRESS NOTE   Diagnosis: Colon cancer  INTERVAL HISTORY:   Nichole Cross returns as scheduled.  Overall she is feeling better.  She attributes this to the IV fluid she received yesterday.  She thinks she emptied the colostomy bag 2 or 3 times since leaving the office yesterday.  She has not started Imodium or Lomotil.  She denies nausea.  No fever or chills.  Rectal pain is unchanged.  Oral intake is poor.  Objective:  Vital signs in last 24 hours:  Blood pressure 94/62, pulse 99, temperature (!) 97.2 F (36.2 C), temperature source Oral, resp. rate 18, height '5\' 7"'  (1.702 m), weight 138 lb 9.6 oz (62.9 kg), SpO2 100 %.    HEENT: Mild white coating over tongue. GI: Loose stool in the colostomy collection bag. Vascular: No leg edema.  Calves soft and nontender.  Port-A-Cath without erythema.  Lab Results:  Lab Results  Component Value Date   WBC 10.7 (H) 03/30/2019   HGB 8.5 (L) 03/30/2019   HCT 29.4 (L) 03/30/2019   MCV 109.3 (H) 03/30/2019   PLT 435 (H) 03/30/2019   NEUTROABS 9.8 (H) 03/30/2019    Imaging:  No results found.  Medications: I have reviewed the patient's current medications.  Assessment/Plan: 1.Adenocarcinoma the distal sigmoid colon, poorly differentiated, stage II (T3 N0), status post a laparoscopic assisted sigmoid colectomy 08/09/2015  No loss of mismatch repair protein expression  Elevated preoperative CEA  Staging CT of the abdomen and pelvis 06/22/2015 with no evidence of distant metastatic disease  Mildly elevated CEA 02/23/2017And 04/17/2016  CTs chest, abdomen, and pelvis on 06/04/2016, 34m left upper lobe nodule-no comparison available, new hypodense lesion in the left liver, solid Right renal mass  MRI 06/10/2016-2 enhancing lesions in the liver consistent with metastatic disease, segment 2 and segment 5. Solid enhancing right renal lesion consistent with a renal cell carcinoma  Radiofrequency  ablation of 2 liver lesions on 07/25/2016  Cycle 1 adjuvant Xeloda 08/18/2016  Cycle 2 Xeloda 09/08/2016  CEA improved 09/26/2016  Cycle 3 Xeloda held 09/29/2016 due to unexplained abdominal pain,referred for CT  CT abdomen/pelvis 09/30/2016 with a long segment of bowel wall thickening involving the distal ileum; lesion left hepatic lobe similar to slightly smaller following ablation; lesion posterior right hepatic lobe elongated favored to represent treatment tract.  Cycle 3 Xeloda 10/10/2016 (dose reduced due to drug induced enteritis following cycle 2)discontinued 10/15/2016 secondary to abdominal pain.  CT in while the emergency room with abdominal pain 10/15/2016 revealed descending colitis  Colonoscopy 10/30/2016 confirmed a mass at the: colo-colonicanastomosis  Cycle 4 Xeloda 11/03/2016  01/09/2017 status post a low anterior resection for removal of locally recurrent tumor at the sigmoid anastomosis, resection margins negative, tumor invades pericolonic soft tissue, 12 benign lymph nodes;MSI stable. Foundation 1 testing- KRAS G13D, MSS, tumor mutation burden-3,BRAFwild-type  Cycle 5 Xeloda 02/12/2017  Cycle 6 Xeloda 03/12/2017, Xeloda dose reduced to 1000 mg twice daily on 03/18/2017  Cycle 7 Xeloda 04/09/2017  Cycle 8 Xeloda 05/12/2017  Restaging CTs 06/24/2017-no evidence of metastatic disease involving the chest. Post ablation changes in the liver. No new hepatic metastatic disease.  Restaging CTs 12/14/2017- stable liver ablation sites, indeterminate nodule adjacent to the descending colon, stable left renal mass, changes of early cirrhosis, enlarged porta hepatis node-likely reactive  MRI abdomen 04/22/2018- enlargement of 1.7 cm left paracolic gutter mass, stable 1 cm extrahepatic left liver capsule lesion, no evidence of local tumor recurrence at the ablation sites,  no new liver lesions, stable right kidney mass  Colonoscopy 05/19/2018- mass in the rectum at the  anastomotic site beginning at 3 cm from the anal verge, biopsy confirmed invasive adenocarcinoma  CT 06/07/2018-stable liver ablation sites, enlarging peritoneal lesion at the left pericolic gutter, stable nodule in the sigmoid mesocolon, stable prominent periportal lymph nodes, no evidence of metastatic disease to the chest  Radiation/Xeloda beginning 06/21/2018  Xeloda dose reduced to 1000 mg twice daily days of radiationbeginning8/16/2019 due to neutropenia  Radiation/Xeloda completed 08/02/2018  CTs 10/07/2018- new 6 mm right upper lobe nodule, stable ablation changes in the left and right liver, stable superior pole right renal mass, increased soft tissue thickening the presacral region, stable 12 mm porta hepatis node, increase in size of soft tissue nodule adjacent to the descending colon-2.1 x 2.2 cm  CTs 01/11/2019- anterior right upper lobe subpleural nodule-smaller, no suspicious lung nodules, stable ablation defects in the liver, stable right kidney mass, wall thickening at the suture line in the lower pelvis with extension to the presacral region/vaginal fornix, slight enlargement of left pericolic gutter peritoneal implant  Sigmoidoscopy 01/27/2019- mass at 5 cm from the anal verge, biopsy confirmed adenocarcinoma  Laparoscopic transverse colostomy and Port-A-Cath placement 03/02/2019- tumor noted to be studding the descending colon    2.History ofMicrocytic anemia-likely iron deficiency anemia secondary to colon cancer, persistent microcytic anemia;ferrous sulfate increased to twice daily 12/15/2017;hemoglobin in normal range 03/16/2018, persistent microcytosis  3.Tubular villous adenoma on the sigmoid colon resection specimen 08/09/2015  Multiple tubular adenomas removed on a colonoscopy 11/16/2015  4. Right renal mass on CT 06/04/2016-suspicious for renal cell carcinoma;stable on CT 06/24/2017;referred to urology  Stable on CT 01/11/2019  5. History  ofneutropenia secondary to chemotherapy  6.Pain secondary to local recurrence of colon cancer  7.Abdominal pain. CT abdomen/pelvis 07/24/2018- inflammatory changes and mild dilatation of multiple small bowel loops in the lower abdomen and pelvis. Unchanged metastatic implant in the left paracolic gutter; unchanged periportal lymphadenopathy; hepatic steatosis; stable liver ablation site; unchanged 2.7 cm enhancing right renal mass.Resolved.  8.  Severe macrocytic anemia 03/28/2019- vitamin B12 level at the low end of normal, hypersegmented neutrophils noted on peripheral blood smear, DAT positive, elevated LDH and bilirubin  Prednisone, 60 mg daily started 03/29/2019  1 unit of packed red blood cells 03/28/2019  Hemoglobin improved 03/30/2019, stable 03/31/2019  B12 initiated 03/30/2019   Disposition: Nichole Cross has a borderline performance status.  We are holding chemotherapy today and rescheduling to 04/04/2019.    She continues to have diarrhea.  We will check a stool sample today for C. difficile.  If negative she will begin Lomotil and/or Imodium.  She will receive additional IV fluids today and tomorrow.  The urine culture from yesterday is positive for E. coli.  A prescription was sent to her pharmacy for Cipro 500 mg twice a day for 5 days.  She will return for lab, follow-up, cycle 1 FOLFOX on 04/04/2019.  She will contact the office in the interim with any problems.  Patient seen with Dr. Benay Spice.  25 minutes were spent face-to-face at today's visit with the majority of that time involved in counseling/coordination of care.  [name] ANP/GNP-BC   03/31/2019  9:34 AM  This was a shared visit with [name].  We decided to hold chemotherapy again today.  She continues to have diarrhea.  We will check a stool sample for the C. difficile toxin.  She will receive intravenous fluids.  She will be treated  for urinary tract infection. NicholeSian will return for intravenous  fluids tomorrow.  Julieanne Manson, MD

## 2019-03-31 NOTE — Telephone Encounter (Signed)
Scheduled appt per 5/14 los.  Left a VM of scheduled appt date and time for 5/15.  Appointments for 5/18 I will add lab and port when I get a response about a f/u appt to add.

## 2019-03-31 NOTE — Patient Instructions (Signed)

## 2019-03-31 NOTE — Telephone Encounter (Deleted)
Scheduled appt per 5/14 los.  Left a VM of scheduled appt date and time for 5/15.

## 2019-04-01 ENCOUNTER — Inpatient Hospital Stay: Payer: Medicare Other

## 2019-04-01 ENCOUNTER — Other Ambulatory Visit: Payer: Self-pay

## 2019-04-01 ENCOUNTER — Other Ambulatory Visit: Payer: Self-pay | Admitting: Nurse Practitioner

## 2019-04-01 VITALS — BP 98/58 | HR 81 | Temp 99.1°F | Resp 18

## 2019-04-01 DIAGNOSIS — C189 Malignant neoplasm of colon, unspecified: Secondary | ICD-10-CM

## 2019-04-01 DIAGNOSIS — Z933 Colostomy status: Secondary | ICD-10-CM | POA: Diagnosis not present

## 2019-04-01 DIAGNOSIS — D509 Iron deficiency anemia, unspecified: Secondary | ICD-10-CM | POA: Diagnosis not present

## 2019-04-01 DIAGNOSIS — D701 Agranulocytosis secondary to cancer chemotherapy: Secondary | ICD-10-CM | POA: Diagnosis not present

## 2019-04-01 DIAGNOSIS — C187 Malignant neoplasm of sigmoid colon: Secondary | ICD-10-CM | POA: Diagnosis not present

## 2019-04-01 DIAGNOSIS — R197 Diarrhea, unspecified: Secondary | ICD-10-CM | POA: Diagnosis not present

## 2019-04-01 DIAGNOSIS — Z5111 Encounter for antineoplastic chemotherapy: Secondary | ICD-10-CM | POA: Diagnosis not present

## 2019-04-01 DIAGNOSIS — N2889 Other specified disorders of kidney and ureter: Secondary | ICD-10-CM | POA: Diagnosis not present

## 2019-04-01 DIAGNOSIS — G893 Neoplasm related pain (acute) (chronic): Secondary | ICD-10-CM | POA: Diagnosis not present

## 2019-04-01 LAB — BASIC METABOLIC PANEL - CANCER CENTER ONLY
Anion gap: 12 (ref 5–15)
BUN: 25 mg/dL — ABNORMAL HIGH (ref 8–23)
CO2: 17 mmol/L — ABNORMAL LOW (ref 22–32)
Calcium: 8 mg/dL — ABNORMAL LOW (ref 8.9–10.3)
Chloride: 105 mmol/L (ref 98–111)
Creatinine: 0.85 mg/dL (ref 0.44–1.00)
GFR, Est AFR Am: >60 mL/min (ref >60)
GFR, Estimated: >60 mL/min (ref >60)
Glucose, Bld: 111 mg/dL — ABNORMAL HIGH (ref 70–99)
Potassium: 3.4 mmol/L — ABNORMAL LOW (ref 3.5–5.1)
Sodium: 134 mmol/L — ABNORMAL LOW (ref 135–145)

## 2019-04-01 MED ORDER — HEPARIN SOD (PORK) LOCK FLUSH 100 UNIT/ML IV SOLN
500.0000 [IU] | Freq: Once | INTRAVENOUS | Status: AC
  Administered 2019-04-01: 16:00:00 500 [IU] via INTRAVENOUS
  Filled 2019-04-01: qty 5

## 2019-04-01 MED ORDER — SODIUM CHLORIDE (PF) 0.9 % IJ SOLN
10.0000 mL | Freq: Once | INTRAMUSCULAR | Status: AC
  Administered 2019-04-01: 16:00:00 10 mL via INTRAVENOUS
  Filled 2019-04-01: qty 10

## 2019-04-01 MED ORDER — SODIUM CHLORIDE 0.9% FLUSH
3.0000 mL | INTRAVENOUS | Status: AC | PRN
  Administered 2019-04-01: 13:00:00 10 mL
  Filled 2019-04-01: qty 10

## 2019-04-01 MED ORDER — SODIUM CHLORIDE 0.9 % IV SOLN
INTRAVENOUS | Status: DC
  Administered 2019-04-01: 14:00:00 via INTRAVENOUS
  Filled 2019-04-01 (×2): qty 250

## 2019-04-01 NOTE — Patient Instructions (Signed)

## 2019-04-01 NOTE — Patient Instructions (Signed)

## 2019-04-02 LAB — URINE CULTURE: Culture: >=100000 — AB

## 2019-04-03 ENCOUNTER — Other Ambulatory Visit: Payer: Self-pay | Admitting: Oncology

## 2019-04-04 ENCOUNTER — Ambulatory Visit: Payer: Medicare Other

## 2019-04-04 ENCOUNTER — Encounter: Payer: Self-pay | Admitting: Nurse Practitioner

## 2019-04-04 ENCOUNTER — Inpatient Hospital Stay (HOSPITAL_BASED_OUTPATIENT_CLINIC_OR_DEPARTMENT_OTHER): Payer: Medicare Other | Admitting: Nurse Practitioner

## 2019-04-04 ENCOUNTER — Inpatient Hospital Stay: Payer: Medicare Other

## 2019-04-04 ENCOUNTER — Other Ambulatory Visit: Payer: Self-pay

## 2019-04-04 ENCOUNTER — Telehealth: Payer: Self-pay | Admitting: Nurse Practitioner

## 2019-04-04 VITALS — BP 111/65 | HR 60 | Resp 18

## 2019-04-04 VITALS — BP 111/71 | HR 81 | Temp 98.7°F | Resp 18 | Ht 67.0 in | Wt 139.0 lb

## 2019-04-04 DIAGNOSIS — G890 Central pain syndrome: Secondary | ICD-10-CM | POA: Diagnosis not present

## 2019-04-04 DIAGNOSIS — C787 Secondary malignant neoplasm of liver and intrahepatic bile duct: Secondary | ICD-10-CM

## 2019-04-04 DIAGNOSIS — D701 Agranulocytosis secondary to cancer chemotherapy: Secondary | ICD-10-CM

## 2019-04-04 DIAGNOSIS — C189 Malignant neoplasm of colon, unspecified: Secondary | ICD-10-CM

## 2019-04-04 DIAGNOSIS — D509 Iron deficiency anemia, unspecified: Secondary | ICD-10-CM | POA: Diagnosis not present

## 2019-04-04 DIAGNOSIS — Z933 Colostomy status: Secondary | ICD-10-CM

## 2019-04-04 DIAGNOSIS — Z95828 Presence of other vascular implants and grafts: Secondary | ICD-10-CM

## 2019-04-04 DIAGNOSIS — G893 Neoplasm related pain (acute) (chronic): Secondary | ICD-10-CM | POA: Diagnosis not present

## 2019-04-04 DIAGNOSIS — C187 Malignant neoplasm of sigmoid colon: Secondary | ICD-10-CM

## 2019-04-04 DIAGNOSIS — N2889 Other specified disorders of kidney and ureter: Secondary | ICD-10-CM

## 2019-04-04 DIAGNOSIS — Z5111 Encounter for antineoplastic chemotherapy: Secondary | ICD-10-CM | POA: Diagnosis not present

## 2019-04-04 DIAGNOSIS — R197 Diarrhea, unspecified: Secondary | ICD-10-CM | POA: Diagnosis not present

## 2019-04-04 LAB — CULTURE, BLOOD (SINGLE): Culture: NO GROWTH

## 2019-04-04 LAB — CBC WITH DIFFERENTIAL (CANCER CENTER ONLY)
Abs Immature Granulocytes: 0.34 10*3/uL — ABNORMAL HIGH (ref 0.00–0.07)
Basophils Absolute: 0 10*3/uL (ref 0.0–0.1)
Basophils Relative: 0 %
Eosinophils Absolute: 0 10*3/uL (ref 0.0–0.5)
Eosinophils Relative: 0 %
HCT: 31.9 % — ABNORMAL LOW (ref 36.0–46.0)
Hemoglobin: 9.4 g/dL — ABNORMAL LOW (ref 12.0–15.0)
Immature Granulocytes: 5 %
Lymphocytes Relative: 8 %
Lymphs Abs: 0.6 10*3/uL — ABNORMAL LOW (ref 0.7–4.0)
MCH: 32.1 pg (ref 26.0–34.0)
MCHC: 29.5 g/dL — ABNORMAL LOW (ref 30.0–36.0)
MCV: 108.9 fL — ABNORMAL HIGH (ref 80.0–100.0)
Monocytes Absolute: 0.3 10*3/uL (ref 0.1–1.0)
Monocytes Relative: 4 %
Neutro Abs: 6.2 10*3/uL (ref 1.7–7.7)
Neutrophils Relative %: 83 %
Platelet Count: 281 10*3/uL (ref 150–400)
RBC: 2.93 MIL/uL — ABNORMAL LOW (ref 3.87–5.11)
RDW: 20.2 % — ABNORMAL HIGH (ref 11.5–15.5)
WBC Count: 7.4 10*3/uL (ref 4.0–10.5)
nRBC: 0 % (ref 0.0–0.2)

## 2019-04-04 LAB — RETIC PANEL
Immature Retic Fract: 37.4 % — ABNORMAL HIGH (ref 2.3–15.9)
RBC.: 2.85 MIL/uL — ABNORMAL LOW (ref 3.87–5.11)
Retic Count, Absolute: 163.9 10*3/uL (ref 19.0–186.0)
Retic Ct Pct: 5.8 % — ABNORMAL HIGH (ref 0.4–3.1)
Reticulocyte Hemoglobin: 38.2 pg (ref >27.9)

## 2019-04-04 LAB — CMP (CANCER CENTER ONLY)
ALT: 7 U/L (ref 0–44)
AST: 14 U/L — ABNORMAL LOW (ref 15–41)
Albumin: 2.6 g/dL — ABNORMAL LOW (ref 3.5–5.0)
Alkaline Phosphatase: 176 U/L — ABNORMAL HIGH (ref 38–126)
Anion gap: 10 (ref 5–15)
BUN: 14 mg/dL (ref 8–23)
CO2: 20 mmol/L — ABNORMAL LOW (ref 22–32)
Calcium: 8.1 mg/dL — ABNORMAL LOW (ref 8.9–10.3)
Chloride: 106 mmol/L (ref 98–111)
Creatinine: 0.78 mg/dL (ref 0.44–1.00)
GFR, Est AFR Am: >60 mL/min (ref >60)
GFR, Estimated: >60 mL/min (ref >60)
Glucose, Bld: 122 mg/dL — ABNORMAL HIGH (ref 70–99)
Potassium: 3.3 mmol/L — ABNORMAL LOW (ref 3.5–5.1)
Sodium: 136 mmol/L (ref 135–145)
Total Bilirubin: 0.4 mg/dL (ref 0.3–1.2)
Total Protein: 5.8 g/dL — ABNORMAL LOW (ref 6.5–8.1)

## 2019-04-04 LAB — LACTATE DEHYDROGENASE: LDH: 243 U/L — ABNORMAL HIGH (ref 98–192)

## 2019-04-04 MED ORDER — SODIUM CHLORIDE 0.9 % IV SOLN
2400.0000 mg/m2 | INTRAVENOUS | Status: DC
  Administered 2019-04-04: 4150 mg via INTRAVENOUS
  Filled 2019-04-04: qty 83

## 2019-04-04 MED ORDER — PALONOSETRON HCL INJECTION 0.25 MG/5ML
INTRAVENOUS | Status: AC
  Filled 2019-04-04: qty 5

## 2019-04-04 MED ORDER — ACETAMINOPHEN 325 MG PO TABS
650.0000 mg | ORAL_TABLET | Freq: Once | ORAL | Status: AC
  Administered 2019-04-04: 14:00:00 650 mg via ORAL

## 2019-04-04 MED ORDER — DEXAMETHASONE SODIUM PHOSPHATE 10 MG/ML IJ SOLN
INTRAMUSCULAR | Status: AC
  Filled 2019-04-04: qty 1

## 2019-04-04 MED ORDER — DEXAMETHASONE SODIUM PHOSPHATE 10 MG/ML IJ SOLN
10.0000 mg | Freq: Once | INTRAMUSCULAR | Status: AC
  Administered 2019-04-04: 12:00:00 10 mg via INTRAVENOUS

## 2019-04-04 MED ORDER — ACETAMINOPHEN 325 MG PO TABS
ORAL_TABLET | ORAL | Status: AC
  Filled 2019-04-04: qty 2

## 2019-04-04 MED ORDER — SODIUM CHLORIDE 0.9% FLUSH
10.0000 mL | INTRAVENOUS | Status: DC | PRN
  Administered 2019-04-04: 10 mL via INTRAVENOUS
  Filled 2019-04-04: qty 10

## 2019-04-04 MED ORDER — POTASSIUM CHLORIDE CRYS ER 20 MEQ PO TBCR: 20.0000 meq | EXTENDED_RELEASE_TABLET | Freq: Every day | ORAL | 1 refills | Status: DC

## 2019-04-04 MED ORDER — PALONOSETRON HCL INJECTION 0.25 MG/5ML
0.2500 mg | Freq: Once | INTRAVENOUS | Status: AC
  Administered 2019-04-04: 12:00:00 0.25 mg via INTRAVENOUS

## 2019-04-04 MED ORDER — DEXTROSE 5 % IV SOLN
Freq: Once | INTRAVENOUS | Status: AC
  Administered 2019-04-04: 12:00:00 via INTRAVENOUS
  Filled 2019-04-04: qty 250

## 2019-04-04 MED ORDER — LEUCOVORIN CALCIUM INJECTION 350 MG
400.0000 mg/m2 | Freq: Once | INTRAVENOUS | Status: AC
  Administered 2019-04-04: 688 mg via INTRAVENOUS
  Filled 2019-04-04: qty 34.4

## 2019-04-04 MED ORDER — OXALIPLATIN CHEMO INJECTION 100 MG/20ML
85.0000 mg/m2 | Freq: Once | INTRAVENOUS | Status: AC
  Administered 2019-04-04: 145 mg via INTRAVENOUS
  Filled 2019-04-04: qty 10

## 2019-04-04 NOTE — Patient Instructions (Signed)
[facility] Discharge Instructions for Patients Receiving Chemotherapy  Today you received the following chemotherapy agents:  Oxaliplatin, Leucovorin, and 5FU.  To help prevent nausea and vomiting after your treatment, we encourage you to take your nausea medication as directed.   If you develop nausea and vomiting that is not controlled by your nausea medication, call the clinic.   BELOW ARE SYMPTOMS THAT SHOULD BE REPORTED IMMEDIATELY:  *FEVER GREATER THAN 100.5 F  *CHILLS WITH OR WITHOUT FEVER  NAUSEA AND VOMITING THAT IS NOT CONTROLLED WITH YOUR NAUSEA MEDICATION  *UNUSUAL SHORTNESS OF BREATH  *UNUSUAL BRUISING OR BLEEDING  TENDERNESS IN MOUTH AND THROAT WITH OR WITHOUT PRESENCE OF ULCERS  *URINARY PROBLEMS  *BOWEL PROBLEMS  UNUSUAL RASH Items with * indicate a potential emergency and should be followed up as soon as possible.  Feel free to call the clinic should you have any questions or concerns. The clinic phone number is [phone].  Please show the CHEMO ALERT CARD at check-in to the Emergency Department and triage nurse.  Oxaliplatin Injection What is this medicine? OXALIPLATIN (ox AL i PLA tin) is a chemotherapy drug. It targets fast dividing cells, like cancer cells, and causes these cells to die. This medicine is used to treat cancers of the colon and rectum, and many other cancers. This medicine may be used for other purposes; ask your health care provider or pharmacist if you have questions. COMMON BRAND NAME(S): Eloxatin What should I tell my health care provider before I take this medicine? They need to know if you have any of these conditions: -kidney disease -an unusual or allergic reaction to oxaliplatin, other chemotherapy, other medicines, foods, dyes, or preservatives -pregnant or trying to get pregnant -breast-feeding How should I use this medicine? This drug is given as an infusion into a vein. It is administered in a hospital  or clinic by a specially trained health care professional. Talk to your pediatrician regarding the use of this medicine in children. Special care may be needed. Overdosage: If you think you have taken too much of this medicine contact a poison control center or emergency room at once. NOTE: This medicine is only for you. Do not share this medicine with others. What if I miss a dose? It is important not to miss a dose. Call your doctor or health care professional if you are unable to keep an appointment. What may interact with this medicine? -medicines to increase blood counts like filgrastim, pegfilgrastim, sargramostim -probenecid -some antibiotics like amikacin, gentamicin, neomycin, polymyxin B, streptomycin, tobramycin -zalcitabine Talk to your doctor or health care professional before taking any of these medicines: -acetaminophen -aspirin -ibuprofen -ketoprofen -naproxen This list may not describe all possible interactions. Give your health care provider a list of all the medicines, herbs, non-prescription drugs, or dietary supplements you use. Also tell them if you smoke, drink alcohol, or use illegal drugs. Some items may interact with your medicine. What should I watch for while using this medicine? Your condition will be monitored carefully while you are receiving this medicine. You will need important blood work done while you are taking this medicine. This medicine can make you more sensitive to cold. Do not drink cold drinks or use ice. Cover exposed skin before coming in contact with cold temperatures or cold objects. When out in cold weather wear warm clothing and cover your mouth and nose to warm the air that goes into your lungs. Tell your doctor if you get sensitive to the   cold. This drug may make you feel generally unwell. This is not uncommon, as chemotherapy can affect healthy cells as well as cancer cells. Report any side effects. Continue your course of treatment even  though you feel ill unless your doctor tells you to stop. In some cases, you may be given additional medicines to help with side effects. Follow all directions for their use. Call your doctor or health care professional for advice if you get a fever, chills or sore throat, or other symptoms of a cold or flu. Do not treat yourself. This drug decreases your body's ability to fight infections. Try to avoid being around people who are sick. This medicine may increase your risk to bruise or bleed. Call your doctor or health care professional if you notice any unusual bleeding. Be careful brushing and flossing your teeth or using a toothpick because you may get an infection or bleed more easily. If you have any dental work done, tell your dentist you are receiving this medicine. Avoid taking products that contain aspirin, acetaminophen, ibuprofen, naproxen, or ketoprofen unless instructed by your doctor. These medicines may hide a fever. Do not become pregnant while taking this medicine. Women should inform their doctor if they wish to become pregnant or think they might be pregnant. There is a potential for serious side effects to an unborn child. Talk to your health care professional or pharmacist for more information. Do not breast-feed an infant while taking this medicine. Call your doctor or health care professional if you get diarrhea. Do not treat yourself. What side effects may I notice from receiving this medicine? Side effects that you should report to your doctor or health care professional as soon as possible: -allergic reactions like skin rash, itching or hives, swelling of the face, lips, or tongue -low blood counts - This drug may decrease the number of white blood cells, red blood cells and platelets. You may be at increased risk for infections and bleeding. -signs of infection - fever or chills, cough, sore throat, pain or difficulty passing urine -signs of decreased platelets or bleeding -  bruising, pinpoint red spots on the skin, black, tarry stools, nosebleeds -signs of decreased red blood cells - unusually weak or tired, fainting spells, lightheadedness -breathing problems -chest pain, pressure -cough -diarrhea -jaw tightness -mouth sores -nausea and vomiting -pain, swelling, redness or irritation at the injection site -pain, tingling, numbness in the hands or feet -problems with balance, talking, walking -redness, blistering, peeling or loosening of the skin, including inside the mouth -trouble passing urine or change in the amount of urine Side effects that usually do not require medical attention (report to your doctor or health care professional if they continue or are bothersome): -changes in vision -constipation -hair loss -loss of appetite -metallic taste in the mouth or changes in taste -stomach pain This list may not describe all possible side effects. Call your doctor for medical advice about side effects. You may report side effects to FDA at 1-800-FDA-1088. Where should I keep my medicine? This drug is given in a hospital or clinic and will not be stored at home. NOTE: This sheet is a summary. It may not cover all possible information. If you have questions about this medicine, talk to your doctor, pharmacist, or health care provider.  2019 [employer] Standard (2008-05-30 17:22:47)  Leucovorin injection What is this medicine? LEUCOVORIN (loo koe VOR in) is used to prevent or treat the harmful effects of some medicines. This medicine is used to   treat anemia caused by a low amount of folic acid in the body. It is also used with 5-fluorouracil (5-FU) to treat colon cancer. This medicine may be used for other purposes; ask your health care provider or pharmacist if you have questions. What should I tell my health care provider before I take this medicine? They need to know if you have any of these conditions: -anemia from low levels of vitamin B-12 in  the blood -an unusual or allergic reaction to leucovorin, folic acid, other medicines, foods, dyes, or preservatives -pregnant or trying to get pregnant -breast-feeding How should I use this medicine? This medicine is for injection into a muscle or into a vein. It is given by a health care professional in a hospital or clinic setting. Talk to your pediatrician regarding the use of this medicine in children. Special care may be needed. Overdosage: If you think you have taken too much of this medicine contact a poison control center or emergency room at once. NOTE: This medicine is only for you. Do not share this medicine with others. What if I miss a dose? This does not apply. What may interact with this medicine? -capecitabine -fluorouracil -phenobarbital -phenytoin -primidone -trimethoprim-sulfamethoxazole This list may not describe all possible interactions. Give your health care provider a list of all the medicines, herbs, non-prescription drugs, or dietary supplements you use. Also tell them if you smoke, drink alcohol, or use illegal drugs. Some items may interact with your medicine. What should I watch for while using this medicine? Your condition will be monitored carefully while you are receiving this medicine. This medicine may increase the side effects of 5-fluorouracil, 5-FU. Tell your doctor or health care professional if you have diarrhea or mouth sores that do not get better or that get worse. What side effects may I notice from receiving this medicine? Side effects that you should report to your doctor or health care professional as soon as possible: -allergic reactions like skin rash, itching or hives, swelling of the face, lips, or tongue -breathing problems -fever, infection -mouth sores -unusual bleeding or bruising -unusually weak or tired Side effects that usually do not require medical attention (report to your doctor or health care professional if they continue or  are bothersome): -constipation or diarrhea -loss of appetite -nausea, vomiting This list may not describe all possible side effects. Call your doctor for medical advice about side effects. You may report side effects to FDA at 1-800-FDA-1088. Where should I keep my medicine? This drug is given in a hospital or clinic and will not be stored at home. NOTE: This sheet is a summary. It may not cover all possible information. If you have questions about this medicine, talk to your doctor, pharmacist, or health care provider.  2019 Else[employer]/Gold Standard (2008-05-09 16:50:29)  Fluorouracil, 5-FU injection What is this medicine? FLUOROURACIL, 5-FU (flure oh YOOR a sil) is a chemotherapy drug. It slows the growth of cancer cells. This medicine is used to treat many types of cancer like breast cancer, colon or rectal cancer, pancreatic cancer, and stomach cancer. This medicine may be used for other purposes; ask your health care provider or pharmacist if you have questions. COMMON BRAND NAME(S): Adrucil What should I tell my health care provider before I take this medicine? They need to know if you have any of these conditions: -blood disorders -dihydropyrimidine dehydrogenase (DPD) deficiency -infection (especially a virus infection such as chickenpox, cold sores, or herpes) -kidney disease -liver disease -malnourished, poor nutrition -recent   or ongoing radiation therapy -an unusual or allergic reaction to fluorouracil, other chemotherapy, other medicines, foods, dyes, or preservatives -pregnant or trying to get pregnant -breast-feeding How should I use this medicine? This drug is given as an infusion or injection into a vein. It is administered in a hospital or clinic by a specially trained health care professional. Talk to your pediatrician regarding the use of this medicine in children. Special care may be needed. Overdosage: If you think you have taken too much of this medicine contact a  poison control center or emergency room at once. NOTE: This medicine is only for you. Do not share this medicine with others. What if I miss a dose? It is important not to miss your dose. Call your doctor or health care professional if you are unable to keep an appointment. What may interact with this medicine? -allopurinol -cimetidine -dapsone -digoxin -hydroxyurea -leucovorin -levamisole -medicines for seizures like ethotoin, fosphenytoin, phenytoin -medicines to increase blood counts like filgrastim, pegfilgrastim, sargramostim -medicines that treat or prevent blood clots like warfarin, enoxaparin, and dalteparin -methotrexate -metronidazole -pyrimethamine -some other chemotherapy drugs like busulfan, cisplatin, estramustine, vinblastine -trimethoprim -trimetrexate -vaccines Talk to your doctor or health care professional before taking any of these medicines: -acetaminophen -aspirin -ibuprofen -ketoprofen -naproxen This list may not describe all possible interactions. Give your health care provider a list of all the medicines, herbs, non-prescription drugs, or dietary supplements you use. Also tell them if you smoke, drink alcohol, or use illegal drugs. Some items may interact with your medicine. What should I watch for while using this medicine? Visit your doctor for checks on your progress. This drug may make you feel generally unwell. This is not uncommon, as chemotherapy can affect healthy cells as well as cancer cells. Report any side effects. Continue your course of treatment even though you feel ill unless your doctor tells you to stop. In some cases, you may be given additional medicines to help with side effects. Follow all directions for their use. Call your doctor or health care professional for advice if you get a fever, chills or sore throat, or other symptoms of a cold or flu. Do not treat yourself. This drug decreases your body's ability to fight infections. Try to  avoid being around people who are sick. This medicine may increase your risk to bruise or bleed. Call your doctor or health care professional if you notice any unusual bleeding. Be careful brushing and flossing your teeth or using a toothpick because you may get an infection or bleed more easily. If you have any dental work done, tell your dentist you are receiving this medicine. Avoid taking products that contain aspirin, acetaminophen, ibuprofen, naproxen, or ketoprofen unless instructed by your doctor. These medicines may hide a fever. Do not become pregnant while taking this medicine. Women should inform their doctor if they wish to become pregnant or think they might be pregnant. There is a potential for serious side effects to an unborn child. Talk to your health care professional or pharmacist for more information. Do not breast-feed an infant while taking this medicine. Men should inform their doctor if they wish to father a child. This medicine may lower sperm counts. Do not treat diarrhea with over the counter products. Contact your doctor if you have diarrhea that lasts more than 2 days or if it is severe and watery. This medicine can make you more sensitive to the sun. Keep out of the sun. If you cannot avoid being in the   sun, wear protective clothing and use sunscreen. Do not use sun lamps or tanning beds/booths. What side effects may I notice from receiving this medicine? Side effects that you should report to your doctor or health care professional as soon as possible: -allergic reactions like skin rash, itching or hives, swelling of the face, lips, or tongue -low blood counts - this medicine may decrease the number of white blood cells, red blood cells and platelets. You may be at increased risk for infections and bleeding. -signs of infection - fever or chills, cough, sore throat, pain or difficulty passing urine -signs of decreased platelets or bleeding - bruising, pinpoint red spots  on the skin, black, tarry stools, blood in the urine -signs of decreased red blood cells - unusually weak or tired, fainting spells, lightheadedness -breathing problems -changes in vision -chest pain -mouth sores -nausea and vomiting -pain, swelling, redness at site where injected -pain, tingling, numbness in the hands or feet -redness, swelling, or sores on hands or feet -stomach pain -unusual bleeding Side effects that usually do not require medical attention (report to your doctor or health care professional if they continue or are bothersome): -changes in finger or toe nails -diarrhea -dry or itchy skin -hair loss -headache -loss of appetite -sensitivity of eyes to the light -stomach upset -unusually teary eyes This list may not describe all possible side effects. Call your doctor for medical advice about side effects. You may report side effects to FDA at 1-800-FDA-1088. Where should I keep my medicine? This drug is given in a hospital or clinic and will not be stored at home. NOTE: This sheet is a summary. It may not cover all possible information. If you have questions about this medicine, talk to your doctor, pharmacist, or health care provider.  2019 Elsevier/[employer] Standard (2008-03-08 13:53:16)      

## 2019-04-04 NOTE — Progress Notes (Signed)
Hard to get blood return from [facility]. Did eventually get it and collected labs

## 2019-04-04 NOTE — Telephone Encounter (Signed)
Scheduled appt per 5/18 los.  Added treatment to the book for approval.  Will call patient once treatment has been added.

## 2019-04-04 NOTE — Progress Notes (Addendum)
°[facility] °OFFICE PROGRESS NOTE ° ° °Diagnosis: Colon cancer ° °INTERVAL HISTORY:  ° °Nichole Cross returns as scheduled.  She is feeling much better.  She notes she is emptying the colostomy bag less.  Stool remains watery.  She is taking Lomotil 1 tablet twice a day.  Appetite was better over the weekend.  She continues to have pain at the rectum.  No nausea or vomiting.  No hematuria or dysuria. ° °Objective: ° °Vital signs in last 24 hours: ° °Blood pressure 111/71, pulse 81, temperature 98.7 °F (37.1 °C), temperature source Oral, resp. rate 18, height 5' 7" (1.702 m), weight 139 lb (63 kg), SpO2 100 %. °  ° °HEENT: No thrush or ulcers. °GI: Colostomy collection bag is empty. °Vascular: Trace edema at the ankles bilaterally. °Skin: Normal skin turgor. °Port-A-Cath without erythema. ° ° °Lab Results: ° °Lab Results  °Component Value Date  ° WBC 7.4 04/04/2019  ° HGB 9.4 (L) 04/04/2019  ° HCT 31.9 (L) 04/04/2019  ° MCV 108.9 (H) 04/04/2019  ° PLT 281 04/04/2019  ° NEUTROABS 6.2 04/04/2019  ° ° °Imaging: ° °No results found. ° °Medications: I have reviewed the patient's current medications. ° °Assessment/Plan: °1.  Adenocarcinoma the distal sigmoid colon, poorly differentiated, stage II (T3 N0), status post a laparoscopic assisted sigmoid colectomy 08/09/2015 °· No loss of mismatch repair protein expression °· Elevated preoperative CEA °· Staging CT of the abdomen and pelvis 06/22/2015 with no evidence of distant metastatic disease °· Mildly elevated CEA 02/23/2017And 04/17/2016 °· CTs chest, abdomen, and pelvis on 06/04/2016, 2 mm left upper lobe nodule-no comparison available, new hypodense lesion in the left liver, solid Right renal mass °· MRI 06/10/2016-2 enhancing lesions in the liver consistent with metastatic disease, segment 2 and segment 5. Solid enhancing right renal lesion consistent with a renal cell carcinoma °· Radiofrequency ablation of 2 liver lesions on 07/25/2016 °· Cycle 1 adjuvant  Xeloda 08/18/2016 °· Cycle 2 Xeloda 09/08/2016 °· CEA improved 09/26/2016 °· Cycle 3 Xeloda held 09/29/2016 due to unexplained abdominal pain, referred for CT °· CT abdomen/pelvis 09/30/2016 with a long segment of bowel wall thickening involving the distal ileum; lesion left hepatic lobe similar to slightly smaller following ablation; lesion posterior right hepatic lobe elongated favored to represent treatment tract. °· Cycle 3 Xeloda 10/10/2016 (dose reduced due to drug induced enteritis following cycle 2) discontinued 10/15/2016 secondary to abdominal pain. °· CT in while the emergency room with abdominal pain 10/15/2016 revealed descending colitis °· Colonoscopy 10/30/2016 confirmed a mass at the: colo-colonic anastomosis °· Cycle 4 Xeloda 11/03/2016 °· 01/09/2017 status post a low anterior resection for removal of locally recurrent tumor at the sigmoid anastomosis, resection margins negative, tumor invades pericolonic soft tissue, 12 benign lymph nodes; MSI stable.  Foundation 1 testing- KRAS G13D, MSS, tumor mutation burden-3, BRAF wild-type °· Cycle 5 Xeloda 02/12/2017 °· Cycle 6 Xeloda 03/12/2017, Xeloda dose reduced to 1000 mg twice daily on 03/18/2017 °· Cycle 7 Xeloda 04/09/2017 °· Cycle 8 Xeloda 05/12/2017 °· Restaging CTs 06/24/2017-no evidence of metastatic disease involving the chest. Post ablation changes in the liver. No new hepatic metastatic disease. °· Restaging CTs 12/14/2017- stable liver ablation sites, indeterminate nodule adjacent to the descending colon, stable left renal mass, changes of early cirrhosis, enlarged porta hepatis node-likely reactive °· MRI abdomen 04/22/2018- enlargement of 1.7 cm left paracolic gutter mass, stable 1 cm extrahepatic left liver capsule lesion, no evidence of local tumor recurrence at the ablation sites, no new liver lesions, stable right kidney mass °· Colonoscopy 05/19/2018- mass   in the rectum at the anastomotic site beginning at 3 cm from the anal verge, biopsy  confirmed invasive adenocarcinoma °· CT 06/07/2018-stable liver ablation sites, enlarging peritoneal lesion at the left pericolic gutter, stable nodule in the sigmoid mesocolon, stable prominent periportal lymph nodes, no evidence of metastatic disease to the chest °· Radiation/Xeloda beginning 06/21/2018 °· Xeloda dose reduced to 1000 mg twice daily days of radiation beginning 07/02/2018 due to neutropenia °· Radiation/Xeloda completed 08/02/2018 °· CTs 10/07/2018- new 6 mm right upper lobe nodule, stable ablation changes in the left and right liver, stable superior pole right renal mass, increased soft tissue thickening the presacral region, stable 12 mm porta hepatis node, increase in size of soft tissue nodule adjacent to the descending colon-2.1 x 2.2 cm °· CTs 01/11/2019- anterior right upper lobe subpleural nodule-smaller, no suspicious lung nodules, stable ablation defects in the liver, stable right kidney mass, wall thickening at the suture line in the lower pelvis with extension to the presacral region/vaginal fornix, slight enlargement of left pericolic gutter peritoneal implant °· Sigmoidoscopy 01/27/2019- mass at 5 cm from the anal verge, biopsy confirmed adenocarcinoma °· Laparoscopic transverse colostomy and Port-A-Cath placement 03/02/2019- tumor noted to be studding the descending colon °· Cycle 1 FOLFOX 04/04/2019 °  °  °  °2.  History of Microcytic anemia-likely iron deficiency anemia secondary to colon cancer, persistent microcytic anemia; ferrous sulfate increased to twice daily 12/15/2017; hemoglobin in normal range 03/16/2018, persistent microcytosis °  °3.  Tubular villous adenoma on the sigmoid colon resection specimen 08/09/2015 °· Multiple tubular adenomas removed on a colonoscopy 11/16/2015 °  °4.   Right renal mass on CT 06/04/2016-suspicious for renal cell carcinoma; stable on CT 06/24/2017; referred to urology °· Stable on CT 01/11/2019 °  °5.   History of neutropenia secondary to  chemotherapy °  °6.   Pain secondary to local recurrence of colon cancer °  °7.   Abdominal pain.  CT abdomen/pelvis 07/24/2018- inflammatory changes and mild dilatation of multiple small bowel loops in the lower abdomen and pelvis.  Unchanged metastatic implant in the left paracolic gutter; unchanged periportal lymphadenopathy; hepatic steatosis; stable liver ablation site; unchanged 2.7 cm enhancing right renal mass.  Resolved. °  °8.  Severe macrocytic anemia 03/28/2019- vitamin B12 level at the low end of normal, hypersegmented neutrophils noted on peripheral blood smear, DAT positive, elevated LDH and bilirubin °· Prednisone, 60 mg daily started 03/29/2019 °· 1 unit of packed red blood cells 03/28/2019 °· Hemoglobin improved 03/30/2019, stable 03/31/2019 °· B12 initiated 03/30/2019 °· Hemoglobin improved 04/04/2019 °  ° °Disposition: Nichole Cross appears improved.  Plan to proceed with cycle 1 FOLFOX today as scheduled. ° °She continues to have loose stools but has noted partial improvement with Lomotil.  She will increase Lomotil from 1 tablet twice daily to 1 tablet 4 times daily.  She will take Imodium if needed. ° °She has hypokalemia likely related to the diarrhea.  She will begin K-Dur 20 milliequivalents daily. ° °She has persistent anemia, some improved.  She will continue oral B12.  We are adding a reticulocyte count to today's labs. ° °She will return for a follow-up visit in 1 week.  She will contact the office in the interim with any problems. ° °Patient seen with Dr. Sherrill. ° ° ° ° ° °Lisa Thomas ANP/GNP-BC  ° °04/04/2019  °11:30 AM ° °This was a shared visit with Lisa Thomas.  Nichole Cross appears improved compared to when we saw her last week.  She will continue Imodium and Lomotil for diarrhea.  The potassium will be repleted.  She will begin FOLFOX chemotherapy today. ° °The anemia and markers of hemolysis have improved. °Brad Sherrill, MD ° ° ° ° ° °

## 2019-04-05 ENCOUNTER — Telehealth: Payer: Self-pay | Admitting: *Deleted

## 2019-04-05 DIAGNOSIS — C787 Secondary malignant neoplasm of liver and intrahepatic bile duct: Secondary | ICD-10-CM | POA: Diagnosis not present

## 2019-04-05 DIAGNOSIS — Z85528 Personal history of other malignant neoplasm of kidney: Secondary | ICD-10-CM | POA: Diagnosis not present

## 2019-04-05 DIAGNOSIS — M810 Age-related osteoporosis without current pathological fracture: Secondary | ICD-10-CM | POA: Diagnosis not present

## 2019-04-05 DIAGNOSIS — Z483 Aftercare following surgery for neoplasm: Secondary | ICD-10-CM | POA: Diagnosis not present

## 2019-04-05 DIAGNOSIS — Z433 Encounter for attention to colostomy: Secondary | ICD-10-CM | POA: Diagnosis not present

## 2019-04-05 DIAGNOSIS — Z466 Encounter for fitting and adjustment of urinary device: Secondary | ICD-10-CM | POA: Diagnosis not present

## 2019-04-05 DIAGNOSIS — M1990 Unspecified osteoarthritis, unspecified site: Secondary | ICD-10-CM | POA: Diagnosis not present

## 2019-04-05 DIAGNOSIS — C189 Malignant neoplasm of colon, unspecified: Secondary | ICD-10-CM | POA: Diagnosis not present

## 2019-04-05 DIAGNOSIS — D508 Other iron deficiency anemias: Secondary | ICD-10-CM | POA: Diagnosis not present

## 2019-04-05 NOTE — Telephone Encounter (Addendum)
Left VM for patient to return call with her status since chemo tx 04/03/18. Patient returned call and is doing well. No N/V, eating well and no diarrhea. Instructed her to call any questions or problems.

## 2019-04-06 ENCOUNTER — Inpatient Hospital Stay: Payer: Medicare Other

## 2019-04-06 ENCOUNTER — Other Ambulatory Visit: Payer: Self-pay

## 2019-04-06 VITALS — BP 112/58 | HR 62 | Resp 17

## 2019-04-06 DIAGNOSIS — G893 Neoplasm related pain (acute) (chronic): Secondary | ICD-10-CM | POA: Diagnosis not present

## 2019-04-06 DIAGNOSIS — D701 Agranulocytosis secondary to cancer chemotherapy: Secondary | ICD-10-CM | POA: Diagnosis not present

## 2019-04-06 DIAGNOSIS — Z933 Colostomy status: Secondary | ICD-10-CM | POA: Diagnosis not present

## 2019-04-06 DIAGNOSIS — D509 Iron deficiency anemia, unspecified: Secondary | ICD-10-CM | POA: Diagnosis not present

## 2019-04-06 DIAGNOSIS — R197 Diarrhea, unspecified: Secondary | ICD-10-CM | POA: Diagnosis not present

## 2019-04-06 DIAGNOSIS — N2889 Other specified disorders of kidney and ureter: Secondary | ICD-10-CM | POA: Diagnosis not present

## 2019-04-06 DIAGNOSIS — C189 Malignant neoplasm of colon, unspecified: Secondary | ICD-10-CM

## 2019-04-06 DIAGNOSIS — Z5111 Encounter for antineoplastic chemotherapy: Secondary | ICD-10-CM | POA: Diagnosis not present

## 2019-04-06 DIAGNOSIS — C187 Malignant neoplasm of sigmoid colon: Secondary | ICD-10-CM | POA: Diagnosis not present

## 2019-04-06 MED ORDER — SODIUM CHLORIDE 0.9% FLUSH
10.0000 mL | INTRAVENOUS | Status: DC | PRN
  Administered 2019-04-06: 10 mL
  Filled 2019-04-06: qty 10

## 2019-04-06 MED ORDER — HEPARIN SOD (PORK) LOCK FLUSH 100 UNIT/ML IV SOLN
500.0000 [IU] | Freq: Once | INTRAVENOUS | Status: DC
  Filled 2019-04-06: qty 5

## 2019-04-06 MED ORDER — SODIUM CHLORIDE 0.9% FLUSH
10.0000 mL | INTRAVENOUS | Status: DC | PRN
  Filled 2019-04-06: qty 10

## 2019-04-06 MED ORDER — HEPARIN SOD (PORK) LOCK FLUSH 100 UNIT/ML IV SOLN
500.0000 [IU] | Freq: Once | INTRAVENOUS | Status: AC | PRN
  Administered 2019-04-06: 500 [IU]
  Filled 2019-04-06: qty 5

## 2019-04-08 ENCOUNTER — Other Ambulatory Visit: Payer: Self-pay | Admitting: Nurse Practitioner

## 2019-04-08 ENCOUNTER — Telehealth: Payer: Self-pay | Admitting: *Deleted

## 2019-04-08 DIAGNOSIS — C189 Malignant neoplasm of colon, unspecified: Secondary | ICD-10-CM

## 2019-04-08 MED ORDER — SULFAMETHOXAZOLE-TRIMETHOPRIM 800-160 MG PO TABS: 1.0000 | ORAL_TABLET | Freq: Two times a day (BID) | ORAL | 0 refills | Status: AC

## 2019-04-08 NOTE — Telephone Encounter (Addendum)
Thinks she has UTI: Burning with urination and frequency as well. Last dose of Cipro was 04/03/19 for UTI on 03/30/19. No fever. Start Bactrim DS #1 po bid X 5 days. Patient notified.

## 2019-04-11 ENCOUNTER — Other Ambulatory Visit: Payer: Self-pay | Admitting: Oncology

## 2019-04-12 ENCOUNTER — Other Ambulatory Visit: Payer: Self-pay

## 2019-04-12 ENCOUNTER — Telehealth: Payer: Self-pay | Admitting: Nurse Practitioner

## 2019-04-12 ENCOUNTER — Ambulatory Visit: Payer: Medicare Other

## 2019-04-12 ENCOUNTER — Telehealth: Payer: Self-pay | Admitting: *Deleted

## 2019-04-12 ENCOUNTER — Telehealth: Payer: Self-pay | Admitting: Hematology

## 2019-04-12 ENCOUNTER — Inpatient Hospital Stay: Payer: Medicare Other

## 2019-04-12 ENCOUNTER — Inpatient Hospital Stay (HOSPITAL_BASED_OUTPATIENT_CLINIC_OR_DEPARTMENT_OTHER): Payer: Medicare Other | Admitting: Nurse Practitioner

## 2019-04-12 ENCOUNTER — Encounter: Payer: Self-pay | Admitting: Nurse Practitioner

## 2019-04-12 VITALS — BP 94/57 | HR 66 | Temp 97.8°F | Resp 17 | Ht 67.0 in | Wt 129.3 lb

## 2019-04-12 DIAGNOSIS — D509 Iron deficiency anemia, unspecified: Secondary | ICD-10-CM | POA: Diagnosis not present

## 2019-04-12 DIAGNOSIS — N2889 Other specified disorders of kidney and ureter: Secondary | ICD-10-CM | POA: Diagnosis not present

## 2019-04-12 DIAGNOSIS — D701 Agranulocytosis secondary to cancer chemotherapy: Secondary | ICD-10-CM | POA: Diagnosis not present

## 2019-04-12 DIAGNOSIS — R197 Diarrhea, unspecified: Secondary | ICD-10-CM

## 2019-04-12 DIAGNOSIS — C189 Malignant neoplasm of colon, unspecified: Secondary | ICD-10-CM

## 2019-04-12 DIAGNOSIS — Z933 Colostomy status: Secondary | ICD-10-CM

## 2019-04-12 DIAGNOSIS — Z95828 Presence of other vascular implants and grafts: Secondary | ICD-10-CM

## 2019-04-12 DIAGNOSIS — G893 Neoplasm related pain (acute) (chronic): Secondary | ICD-10-CM

## 2019-04-12 DIAGNOSIS — C787 Secondary malignant neoplasm of liver and intrahepatic bile duct: Secondary | ICD-10-CM

## 2019-04-12 DIAGNOSIS — C187 Malignant neoplasm of sigmoid colon: Secondary | ICD-10-CM

## 2019-04-12 DIAGNOSIS — Z5111 Encounter for antineoplastic chemotherapy: Secondary | ICD-10-CM | POA: Diagnosis not present

## 2019-04-12 LAB — CBC WITH DIFFERENTIAL (CANCER CENTER ONLY)
Abs Immature Granulocytes: 0.17 10*3/uL — ABNORMAL HIGH (ref 0.00–0.07)
Basophils Absolute: 0 10*3/uL (ref 0.0–0.1)
Basophils Relative: 0 %
Eosinophils Absolute: 0 10*3/uL (ref 0.0–0.5)
Eosinophils Relative: 0 %
HCT: 33.2 % — ABNORMAL LOW (ref 36.0–46.0)
Hemoglobin: 10.3 g/dL — ABNORMAL LOW (ref 12.0–15.0)
Immature Granulocytes: 2 %
Lymphocytes Relative: 8 %
Lymphs Abs: 0.8 10*3/uL (ref 0.7–4.0)
MCH: 32.2 pg (ref 26.0–34.0)
MCHC: 31 g/dL (ref 30.0–36.0)
MCV: 103.8 fL — ABNORMAL HIGH (ref 80.0–100.0)
Monocytes Absolute: 0.2 10*3/uL (ref 0.1–1.0)
Monocytes Relative: 2 %
Neutro Abs: 9.1 10*3/uL — ABNORMAL HIGH (ref 1.7–7.7)
Neutrophils Relative %: 88 %
Platelet Count: 238 10*3/uL (ref 150–400)
RBC: 3.2 MIL/uL — ABNORMAL LOW (ref 3.87–5.11)
RDW: 18.6 % — ABNORMAL HIGH (ref 11.5–15.5)
WBC Count: 10.3 10*3/uL (ref 4.0–10.5)
nRBC: 0 % (ref 0.0–0.2)

## 2019-04-12 LAB — RETIC PANEL
Immature Retic Fract: 27 % — ABNORMAL HIGH (ref 2.3–15.9)
RBC.: 3.2 MIL/uL — ABNORMAL LOW (ref 3.87–5.11)
Retic Count, Absolute: 28.8 10*3/uL (ref 19.0–186.0)
Retic Ct Pct: 0.9 % (ref 0.4–3.1)
Reticulocyte Hemoglobin: 41.8 pg (ref >27.9)

## 2019-04-12 LAB — CMP (CANCER CENTER ONLY)
ALT: 10 U/L (ref 0–44)
AST: 17 U/L (ref 15–41)
Albumin: 3 g/dL — ABNORMAL LOW (ref 3.5–5.0)
Alkaline Phosphatase: 145 U/L — ABNORMAL HIGH (ref 38–126)
Anion gap: 9 (ref 5–15)
BUN: 29 mg/dL — ABNORMAL HIGH (ref 8–23)
CO2: 24 mmol/L (ref 22–32)
Calcium: 8 mg/dL — ABNORMAL LOW (ref 8.9–10.3)
Chloride: 98 mmol/L (ref 98–111)
Creatinine: 1.12 mg/dL — ABNORMAL HIGH (ref 0.44–1.00)
GFR, Est AFR Am: 56 mL/min — ABNORMAL LOW (ref >60)
GFR, Estimated: 48 mL/min — ABNORMAL LOW (ref >60)
Glucose, Bld: 93 mg/dL (ref 70–99)
Potassium: 4.7 mmol/L (ref 3.5–5.1)
Sodium: 131 mmol/L — ABNORMAL LOW (ref 135–145)
Total Bilirubin: 0.4 mg/dL (ref 0.3–1.2)
Total Protein: 5.8 g/dL — ABNORMAL LOW (ref 6.5–8.1)

## 2019-04-12 LAB — LACTATE DEHYDROGENASE: LDH: 256 U/L — ABNORMAL HIGH (ref 98–192)

## 2019-04-12 MED ORDER — SODIUM CHLORIDE 0.9% FLUSH
10.0000 mL | INTRAVENOUS | Status: DC | PRN
  Administered 2019-04-12: 10 mL via INTRAVENOUS
  Filled 2019-04-12: qty 10

## 2019-04-12 MED ORDER — OXYCODONE-ACETAMINOPHEN 5-325 MG PO TABS
ORAL_TABLET | ORAL | Status: AC
  Filled 2019-04-12: qty 1

## 2019-04-12 MED ORDER — HEPARIN SOD (PORK) LOCK FLUSH 100 UNIT/ML IV SOLN
500.0000 [IU] | Freq: Once | INTRAVENOUS | Status: AC
  Administered 2019-04-12: 14:00:00 500 [IU] via INTRAVENOUS
  Filled 2019-04-12: qty 5

## 2019-04-12 MED ORDER — OXYCODONE-ACETAMINOPHEN 5-325 MG PO TABS
1.0000 | ORAL_TABLET | Freq: Once | ORAL | Status: AC
  Administered 2019-04-12: 10:00:00 1 via ORAL

## 2019-04-12 MED ORDER — HEPARIN SOD (PORK) LOCK FLUSH 100 UNIT/ML IV SOLN
500.0000 [IU] | Freq: Once | INTRAVENOUS | Status: AC
  Administered 2019-04-12: 500 [IU] via INTRAVENOUS
  Filled 2019-04-12: qty 5

## 2019-04-12 MED ORDER — SODIUM CHLORIDE 0.9 % IV SOLN
INTRAVENOUS | Status: DC
  Administered 2019-04-12 (×2): via INTRAVENOUS
  Filled 2019-04-12 (×3): qty 250

## 2019-04-12 NOTE — Telephone Encounter (Signed)
-----   Message from Owens Shark, NP sent at 04/12/2019  9:44 AM EDT ----- Please call daughter [name] with the following instructions-Lomotil 1 tablet 4 times a day (can increase to 2 tablets 4 times a day if needed), Imodium as needed; decrease prednisone to 20 mg daily.

## 2019-04-12 NOTE — Telephone Encounter (Signed)
Per 5/26 los appt already scheduled.

## 2019-04-12 NOTE — Telephone Encounter (Signed)
Scheduled appt per 5/26 sch message - unable to reach patient . Left message with appt date and time

## 2019-04-12 NOTE — Progress Notes (Signed)
[place] OFFICE PROGRESS NOTE   Diagnosis:  Colon cancer  INTERVAL HISTORY:   [name] returns as scheduled.  She completed cycle 1 FOLFOX 04/04/2019.  She denies nausea/vomiting.  No mouth sores.  She empties the ostomy collection bag 2-3 times a day.  Stools remain watery.  She is taking 1 Lomotil tablet a day and 1 Imodium a day.  Rectal pain is unchanged.  No fever or chills.  No shortness of breath or cough.  She denies lightheadedness and dizziness.  Objective:  Vital signs in last 24 hours:  Blood pressure (!) 87/67, pulse (!) 103, temperature 97.8 F (36.6 C), temperature source Oral, resp. rate 17, height _0  (1.702 m), weight 129 lb 4.8 oz (58.7 kg), SpO2 99 %.    HEENT: White coating over tongue. GI: Left lower quadrant colostomy with liquid stool in the collection bag. Vascular: No leg edema.  Skin: Decreased skin turgor. Port-A-Cath without erythema.   Lab Results:  Lab Results  Component Value Date   WBC 10.3 04/12/2019   HGB 10.3 (L) 04/12/2019   HCT 33.2 (L) 04/12/2019   MCV 103.8 (H) 04/12/2019   PLT 238 04/12/2019   NEUTROABS 9.1 (H) 04/12/2019    Imaging:  No results found.  Medications: I have reviewed the patient's current medications.  Assessment/Plan: 1.Adenocarcinoma the distal sigmoid colon, poorly differentiated, stage II (T3 N0), status post a laparoscopic assisted sigmoid colectomy 08/09/2015  No loss of mismatch repair protein expression  Elevated preoperative CEA  Staging CT of the abdomen and pelvis 06/22/2015 with no evidence of distant metastatic disease  Mildly elevated CEA 02/23/2017And 04/17/2016  CTs chest, abdomen, and pelvis on 06/04/2016, 31m left upper lobe nodule-no comparison available, new hypodense lesion in the left liver, solid Right renal mass  MRI 06/10/2016-2 enhancing lesions in the liver consistent with metastatic disease, segment 2 and segment 5. Solid enhancing right renal lesion  consistent with a renal cell carcinoma  Radiofrequency ablation of 2 liver lesions on 07/25/2016  Cycle 1 adjuvant Xeloda 08/18/2016  Cycle 2 Xeloda 09/08/2016  CEA improved 09/26/2016  Cycle 3 Xeloda held 09/29/2016 due to unexplained abdominal pain,referred for CT  CT abdomen/pelvis 09/30/2016 with a long segment of bowel wall thickening involving the distal ileum; lesion left hepatic lobe similar to slightly smaller following ablation; lesion posterior right hepatic lobe elongated favored to represent treatment tract.  Cycle 3 Xeloda 10/10/2016 (dose reduced due to drug induced enteritis following cycle 2)discontinued 10/15/2016 secondary to abdominal pain.  CT in while the emergency room with abdominal pain 10/15/2016 revealed descending colitis  Colonoscopy 10/30/2016 confirmed a mass at the: colo-colonicanastomosis  Cycle 4 Xeloda 11/03/2016  01/09/2017 status post a low anterior resection for removal of locally recurrent tumor at the sigmoid anastomosis, resection margins negative, tumor invades pericolonic soft tissue, 12 benign lymph nodes;MSI stable. Foundation 1 testing- KRAS G13D, MSS, tumor mutation burden-3,BRAFwild-type  Cycle 5 Xeloda 02/12/2017  Cycle 6 Xeloda 03/12/2017, Xeloda dose reduced to 1000 mg twice daily on 03/18/2017  Cycle 7 Xeloda 04/09/2017  Cycle 8 Xeloda 05/12/2017  Restaging CTs 06/24/2017-no evidence of metastatic disease involving the chest. Post ablation changes in the liver. No new hepatic metastatic disease.  Restaging CTs 12/14/2017- stable liver ablation sites, indeterminate nodule adjacent to the descending colon, stable left renal mass, changes of early cirrhosis, enlarged porta hepatis node-likely reactive  MRI abdomen 04/22/2018- enlargement of 1.7 cm left paracolic gutter mass, stable 1 cm extrahepatic left liver capsule lesion, no  evidence of local tumor recurrence at the ablation sites, no new liver lesions, stable right kidney  mass  Colonoscopy 05/19/2018- mass in the rectum at the anastomotic site beginning at 3 cm from the anal verge, biopsy confirmed invasive adenocarcinoma  CT 06/07/2018-stable liver ablation sites, enlarging peritoneal lesion at the left pericolic gutter, stable nodule in the sigmoid mesocolon, stable prominent periportal lymph nodes, no evidence of metastatic disease to the chest  Radiation/Xeloda beginning 06/21/2018  Xeloda dose reduced to 1000 mg twice daily days of radiationbeginning8/16/2019 due to neutropenia  Radiation/Xeloda completed 08/02/2018  CTs 10/07/2018- new 6 mm right upper lobe nodule, stable ablation changes in the left and right liver, stable superior pole right renal mass, increased soft tissue thickening the presacral region, stable 12 mm porta hepatis node, increase in size of soft tissue nodule adjacent to the descending colon-2.1 x 2.2 cm  CTs 01/11/2019- anterior right upper lobe subpleural nodule-smaller, no suspicious lung nodules, stable ablation defects in the liver, stable right kidney mass, wall thickening at the suture line in the lower pelvis with extension to the presacral region/vaginal fornix, slight enlargement of left pericolic gutter peritoneal implant  Sigmoidoscopy 01/27/2019- mass at 5 cm from the anal verge, biopsy confirmed adenocarcinoma  Laparoscopic transverse colostomy and Port-A-Cath placement 03/02/2019- tumor noted to be studding the descending colon  Cycle 1 FOLFOX 04/04/2019    2.History ofMicrocytic anemia-likely iron deficiency anemia secondary to colon cancer, persistent microcytic anemia;ferrous sulfate increased to twice daily 12/15/2017;hemoglobin in normal range 03/16/2018, persistent microcytosis  3.Tubular villous adenoma on the sigmoid colon resection specimen 08/09/2015  Multiple tubular adenomas removed on a colonoscopy 11/16/2015  4. Right renal mass on CT 06/04/2016-suspicious for renal cell carcinoma;stable on CT  06/24/2017;referred to urology  Stable on CT 01/11/2019  5. History ofneutropenia secondary to chemotherapy  6.Pain secondary to local recurrence of colon cancer  7.Abdominal pain. CT abdomen/pelvis 07/24/2018- inflammatory changes and mild dilatation of multiple small bowel loops in the lower abdomen and pelvis. Unchanged metastatic implant in the left paracolic gutter; unchanged periportal lymphadenopathy; hepatic steatosis; stable liver ablation site; unchanged 2.7 cm enhancing right renal mass.Resolved.  8.Severe macrocytic anemia 03/28/2019- vitamin B12 level at the low end of normal, hypersegmented neutrophils noted on peripheral blood smear, DAT positive, elevated LDH and bilirubin  Prednisone, 60 mg daily started 03/29/2019  1 unit of packed red blood cells 03/28/2019  Hemoglobin improved 03/30/2019, stable 03/31/2019  B12 initiated 03/30/2019  Hemoglobin improved 04/04/2019   Disposition: Nichole Cross has completed 1 cycle of FOLFOX.  She has a poor performance status.  She has lost a significant amount of weight over the past week.  She continues to have diarrhea.  We again discussed the dosing instructions for Lomotil.  She will begin Lomotil 1 tablet 4 times a day.  She understands she can increase Lomotil to 2 tablets 4 times a day and take Imodium also.  She appears dehydrated.  She will receive IV fluids today and tomorrow.  We will repeat vital signs after 1 L today and decide on the second liter.  She is scheduled for follow-up and cycle 2 FOLFOX in 1 week.  We will see her sooner if needed.  Patient seen with Dr. Benay Spice.    [name] ANP/GNP-BC   04/12/2019  9:02 AM  This was a shared visit with [name].  Ms. Heeg was interviewed and examined.  She appears dehydrated again today.  She will receive intravenous fluids.  We encouraged her to use  Lomotil and Imodium more often.  She appears to have a high output colostomy.  Nichole Manson, MD

## 2019-04-12 NOTE — Patient Instructions (Signed)

## 2019-04-12 NOTE — Telephone Encounter (Signed)
Spoke with daughter [name], and gave her instructions from [place], NP below.    Informed her of pt's appt for IVF on 04/13/19. [name] voiced understanding.

## 2019-04-12 NOTE — Progress Notes (Signed)
1400: [name], NP informed about VS post 2nd liter of fluid. Per Lattie Haw, ok for patient to go home. Plan to have patient come back tomorrow for fluids. Thu, RN to put in orders.

## 2019-04-12 NOTE — Patient Instructions (Signed)
Dehydration, Adult  Dehydration is when there is not enough fluid or water in your body. This happens when you lose more fluids than you take in. Dehydration can range from mild to very bad. It should be treated right away to keep it from getting very bad. Symptoms of mild dehydration may include:  Thirst.  Dry lips.  Slightly dry mouth.  Dry, warm skin.  Dizziness. Symptoms of moderate dehydration may include:  Very dry mouth.  Muscle cramps.  Dark pee (urine). Pee may be the color of tea.  Your body making less pee.  Your eyes making fewer tears.  Heartbeat that is uneven or faster than normal (palpitations).  Headache.  Light-headedness, especially when you stand up from sitting.  Fainting (syncope). Symptoms of very bad dehydration may include:  Changes in skin, such as: ? Cold and clammy skin. ? Blotchy (mottled) or pale skin. ? Skin that does not quickly return to normal after being lightly pinched and let go (poor skin turgor).  Changes in body fluids, such as: ? Feeling very thirsty. ? Your eyes making fewer tears. ? Not sweating when body temperature is high, such as in hot weather. ? Your body making very little pee.  Changes in vital signs, such as: ? Weak pulse. ? Pulse that is more than 100 beats a minute when you are sitting still. ? Fast breathing. ? Low blood pressure.  Other changes, such as: ? Sunken eyes. ? Cold hands and feet. ? Confusion. ? Lack of energy (lethargy). ? Trouble waking up from sleep. ? Short-term weight loss. ? Unconsciousness. Follow these instructions at home:   If told by your doctor, drink an ORS: ? Make an ORS by using instructions on the package. ? Start by drinking small amounts, about  cup (120 mL) every 5-10 minutes. ? Slowly drink more until you have had the amount that your doctor said to have.  Drink enough clear fluid to keep your pee clear or pale yellow. If you were told to drink an ORS, finish the  ORS first, then start slowly drinking clear fluids. Drink fluids such as: ? Water. Do not drink only water by itself. Doing that can make the salt (sodium) level in your body get too low (hyponatremia). ? Ice chips. ? Fruit juice that you have added water to (diluted). ? Low-calorie sports drinks.  Avoid: ? Alcohol. ? Drinks that have a lot of sugar. These include high-calorie sports drinks, fruit juice that does not have water added, and soda. ? Caffeine. ? Foods that are greasy or have a lot of fat or sugar.  Take over-the-counter and prescription medicines only as told by your doctor.  Do not take salt tablets. Doing that can make the salt level in your body get too high (hypernatremia).  Eat foods that have minerals (electrolytes). Examples include bananas, oranges, potatoes, tomatoes, and spinach.  Keep all follow-up visits as told by your doctor. This is important. Contact a doctor if:  You have belly (abdominal) pain that: ? Gets worse. ? Stays in one area (localizes).  You have a rash.  You have a stiff neck.  You get angry or annoyed more easily than normal (irritability).  You are more sleepy than normal.  You have a harder time waking up than normal.  You feel: ? Weak. ? Dizzy. ? Very thirsty.  You have peed (urinated) only a small amount of very dark pee during 6-8 hours. Get help right away if:  You have   symptoms of very bad dehydration.  You cannot drink fluids without throwing up (vomiting).  Your symptoms get worse with treatment.  You have a fever.  You have a very bad headache.  You are throwing up or having watery poop (diarrhea) and it: ? Gets worse. ? Does not go away.  You have blood or something green (bile) in your throw-up.  You have blood in your poop (stool). This may cause poop to look black and tarry.  You have not peed in 6-8 hours.  You pass out (faint).  Your heart rate when you are sitting still is more than 100 beats a  minute.  You have trouble breathing. This information is not intended to replace advice given to you by your health care provider. Make sure you discuss any questions you have with your health care provider. Document Released: 08/30/2009 Document Revised: 05/23/2016 Document Reviewed: 12/28/2015 Elsevier Interactive Patient Education  2019 [employer]  

## 2019-04-13 ENCOUNTER — Other Ambulatory Visit: Payer: Self-pay

## 2019-04-13 ENCOUNTER — Inpatient Hospital Stay: Payer: Medicare Other

## 2019-04-13 VITALS — BP 120/53 | HR 64 | Temp 98.2°F | Resp 16

## 2019-04-13 DIAGNOSIS — D509 Iron deficiency anemia, unspecified: Secondary | ICD-10-CM | POA: Diagnosis not present

## 2019-04-13 DIAGNOSIS — Z933 Colostomy status: Secondary | ICD-10-CM | POA: Diagnosis not present

## 2019-04-13 DIAGNOSIS — N2889 Other specified disorders of kidney and ureter: Secondary | ICD-10-CM | POA: Diagnosis not present

## 2019-04-13 DIAGNOSIS — R197 Diarrhea, unspecified: Secondary | ICD-10-CM | POA: Diagnosis not present

## 2019-04-13 DIAGNOSIS — C187 Malignant neoplasm of sigmoid colon: Secondary | ICD-10-CM | POA: Diagnosis not present

## 2019-04-13 DIAGNOSIS — G893 Neoplasm related pain (acute) (chronic): Secondary | ICD-10-CM | POA: Diagnosis not present

## 2019-04-13 DIAGNOSIS — Z5111 Encounter for antineoplastic chemotherapy: Secondary | ICD-10-CM | POA: Diagnosis not present

## 2019-04-13 DIAGNOSIS — D701 Agranulocytosis secondary to cancer chemotherapy: Secondary | ICD-10-CM | POA: Diagnosis not present

## 2019-04-13 DIAGNOSIS — E860 Dehydration: Secondary | ICD-10-CM

## 2019-04-13 MED ORDER — HEPARIN SOD (PORK) LOCK FLUSH 100 UNIT/ML IV SOLN
500.0000 [IU] | Freq: Once | INTRAVENOUS | Status: AC
  Administered 2019-04-13: 12:00:00 500 [IU] via INTRAVENOUS
  Filled 2019-04-13: qty 5

## 2019-04-13 MED ORDER — SODIUM CHLORIDE 0.9 % IV SOLN
Freq: Once | INTRAVENOUS | Status: AC
  Administered 2019-04-13: 08:00:00 via INTRAVENOUS
  Filled 2019-04-13: qty 250

## 2019-04-13 MED ORDER — SODIUM CHLORIDE 0.9 % IV SOLN
INTRAVENOUS | Status: DC
  Administered 2019-04-13: 11:00:00 via INTRAVENOUS
  Filled 2019-04-13 (×2): qty 250

## 2019-04-13 MED ORDER — SODIUM CHLORIDE 0.9% FLUSH
10.0000 mL | Freq: Once | INTRAVENOUS | Status: AC
  Administered 2019-04-13: 12:00:00 10 mL via INTRAVENOUS
  Filled 2019-04-13: qty 10

## 2019-04-13 NOTE — Patient Instructions (Signed)

## 2019-04-14 ENCOUNTER — Telehealth: Payer: Self-pay

## 2019-04-14 NOTE — Telephone Encounter (Signed)
Per Nichole Cross called patient to follow up to check consistency of her output. Patient stated that her stool is still a little watery, and that she is still drinking fluids like instructed and taking her lomotil. She also stated that she did not feel like she needed to come in for fluids today. No further problems or concerns at this time.

## 2019-04-18 ENCOUNTER — Telehealth: Payer: Self-pay | Admitting: Nutrition

## 2019-04-18 ENCOUNTER — Other Ambulatory Visit: Payer: Self-pay | Admitting: *Deleted

## 2019-04-18 ENCOUNTER — Inpatient Hospital Stay: Payer: Medicare Other | Attending: Oncology

## 2019-04-18 ENCOUNTER — Other Ambulatory Visit: Payer: Self-pay

## 2019-04-18 ENCOUNTER — Inpatient Hospital Stay (HOSPITAL_BASED_OUTPATIENT_CLINIC_OR_DEPARTMENT_OTHER): Payer: Medicare Other | Admitting: Oncology

## 2019-04-18 ENCOUNTER — Ambulatory Visit: Payer: Medicare Other

## 2019-04-18 ENCOUNTER — Telehealth: Payer: Self-pay

## 2019-04-18 ENCOUNTER — Telehealth: Payer: Self-pay | Admitting: Oncology

## 2019-04-18 ENCOUNTER — Telehealth: Payer: Self-pay | Admitting: *Deleted

## 2019-04-18 VITALS — BP 100/64 | HR 69 | Temp 97.8°F | Resp 18

## 2019-04-18 VITALS — BP 93/60 | HR 87 | Temp 98.7°F | Resp 17 | Ht 67.0 in | Wt 121.6 lb

## 2019-04-18 DIAGNOSIS — E860 Dehydration: Secondary | ICD-10-CM | POA: Diagnosis not present

## 2019-04-18 DIAGNOSIS — D509 Iron deficiency anemia, unspecified: Secondary | ICD-10-CM | POA: Insufficient documentation

## 2019-04-18 DIAGNOSIS — C187 Malignant neoplasm of sigmoid colon: Secondary | ICD-10-CM

## 2019-04-18 DIAGNOSIS — R634 Abnormal weight loss: Secondary | ICD-10-CM | POA: Insufficient documentation

## 2019-04-18 DIAGNOSIS — D701 Agranulocytosis secondary to cancer chemotherapy: Secondary | ICD-10-CM | POA: Diagnosis not present

## 2019-04-18 DIAGNOSIS — R718 Other abnormality of red blood cells: Secondary | ICD-10-CM | POA: Insufficient documentation

## 2019-04-18 DIAGNOSIS — Z452 Encounter for adjustment and management of vascular access device: Secondary | ICD-10-CM | POA: Diagnosis not present

## 2019-04-18 DIAGNOSIS — C189 Malignant neoplasm of colon, unspecified: Secondary | ICD-10-CM

## 2019-04-18 MED ORDER — TEMAZEPAM 15 MG PO CAPS: 15.0000 mg | ORAL_CAPSULE | Freq: Every evening | ORAL | 1 refills | Status: DC | PRN

## 2019-04-18 MED ORDER — HEPARIN SOD (PORK) LOCK FLUSH 100 UNIT/ML IV SOLN
500.0000 [IU] | Freq: Once | INTRAVENOUS | Status: AC
  Administered 2019-04-18: 500 [IU] via INTRAVENOUS
  Filled 2019-04-18: qty 5

## 2019-04-18 MED ORDER — SODIUM CHLORIDE 0.9 % IV SOLN
Freq: Once | INTRAVENOUS | Status: AC
  Administered 2019-04-18: 12:00:00 via INTRAVENOUS
  Filled 2019-04-18: qty 250

## 2019-04-18 MED ORDER — SODIUM CHLORIDE 0.9% FLUSH
10.0000 mL | INTRAVENOUS | Status: DC | PRN
  Administered 2019-04-18: 10 mL via INTRAVENOUS
  Filled 2019-04-18: qty 10

## 2019-04-18 MED ORDER — SODIUM CHLORIDE 0.9 % IV SOLN
Freq: Once | INTRAVENOUS | Status: AC
  Administered 2019-04-18: 15:00:00 via INTRAVENOUS
  Filled 2019-04-18: qty 250

## 2019-04-18 NOTE — Telephone Encounter (Signed)
"  Nichole Cross [phone]).  Diarrhea all along started again Saturday, worse last night.   Saturday and Sunday, four to five soft, loose BM's. Last night up all night emptying her bag.  Bag leaked at 5:00 am.  Has had about Imodium last at 5:00 am.  No further BM's so maybe she's okay.  Not eating much but drinking water.  No vomiting.  C/O stomach and rectum hurts and weak.  Pain medicine last at 8:00 am."

## 2019-04-18 NOTE — Telephone Encounter (Signed)
Received a nut referral from Dr. Benay Spice. An appt has been scheduled for the pt to see Ernestene Kiel, telephone visit, on 6/3 at 12pm. I lft the appt on the pt and her daughter Anne's vm. I informed them this was a telephone visit and no need for the pt to come to the office.

## 2019-04-18 NOTE — Telephone Encounter (Addendum)
"  [name] [phone]).  Calling to speak with Dr. Gearldine Shown nurse.   Nichole Cross has had diarrhea for a few days.  Would like to gat her in for IVF's.  May be dehydrated."

## 2019-04-18 NOTE — Telephone Encounter (Signed)
Pt's daughter [name] cld back to confirm appt w/Barbara on 6/3. Aware that this is a telephone visit.

## 2019-04-18 NOTE — Progress Notes (Signed)
1430 vital signs called to Dr. Benay Spice after 1 liter of NS. Per Dr. Benay Spice give 500 ml of normal saline over 1 hour.

## 2019-04-18 NOTE — Progress Notes (Signed)
[place] OFFICE PROGRESS NOTE   Diagnosis: Colon cancer  INTERVAL HISTORY:   Nichole Cross returns prior to her scheduled visit.  She reports emptying the ostomy bag approximately 5 times during the night.  She says the stool is both loose and formed.  She last emptied the ostomy bag at approximately 5 AM.  She took Imodium during the night.  She has a poor appetite.  She has noted improvement in rectal pain.  She is now taking oxycodone 2 or 3 times per day.  Objective:  Vital signs in last 24 hours:  Blood pressure 93/60, pulse 87, temperature 98.7 F (37.1 C), temperature source Oral, resp. rate 17, height _0  (1.702 m), weight 121 lb 9.6 oz (55.2 kg), SpO2 99 %.    HEENT: No thrush, mucous membranes are moist Resp: Lungs clear bilaterally Cardio: Regular rate and rhythm GI: No hepatomegaly, nontender, left lower quadrant ostomy bag is empty Vascular: No leg edema  Skin: Diminished skin turgor  Portacath/PICC-without erythema  Lab Results:  Lab Results  Component Value Date   WBC 10.3 04/12/2019   HGB 10.3 (L) 04/12/2019   HCT 33.2 (L) 04/12/2019   MCV 103.8 (H) 04/12/2019   PLT 238 04/12/2019   NEUTROABS 9.1 (H) 04/12/2019    CMP  Lab Results  Component Value Date   NA 131 (L) 04/12/2019   K 4.7 04/12/2019   CL 98 04/12/2019   CO2 24 04/12/2019   GLUCOSE 93 04/12/2019   BUN 29 (H) 04/12/2019   CREATININE 1.12 (H) 04/12/2019   CALCIUM 8.0 (L) 04/12/2019   PROT 5.8 (L) 04/12/2019   ALBUMIN 3.0 (L) 04/12/2019   AST 17 04/12/2019   ALT 10 04/12/2019   ALKPHOS 145 (H) 04/12/2019   BILITOT 0.4 04/12/2019   GFRNONAA 48 (L) 04/12/2019   GFRAA 56 (L) 04/12/2019    Lab Results  Component Value Date   CEA1 178.58 (H) 03/28/2019    Medications: I have reviewed the patient's current medications.   Assessment/Plan: 1.Adenocarcinoma the distal sigmoid colon, poorly differentiated, stage II (T3 N0), status post a laparoscopic assisted  sigmoid colectomy 08/09/2015  No loss of mismatch repair protein expression  Elevated preoperative CEA  Staging CT of the abdomen and pelvis 06/22/2015 with no evidence of distant metastatic disease  Mildly elevated CEA 02/23/2017And 04/17/2016  CTs chest, abdomen, and pelvis on 06/04/2016, 22m left upper lobe nodule-no comparison available, new hypodense lesion in the left liver, solid Right renal mass  MRI 06/10/2016-2 enhancing lesions in the liver consistent with metastatic disease, segment 2 and segment 5. Solid enhancing right renal lesion consistent with a renal cell carcinoma  Radiofrequency ablation of 2 liver lesions on 07/25/2016  Cycle 1 adjuvant Xeloda 08/18/2016  Cycle 2 Xeloda 09/08/2016  CEA improved 09/26/2016  Cycle 3 Xeloda held 09/29/2016 due to unexplained abdominal pain,referred for CT  CT abdomen/pelvis 09/30/2016 with a long segment of bowel wall thickening involving the distal ileum; lesion left hepatic lobe similar to slightly smaller following ablation; lesion posterior right hepatic lobe elongated favored to represent treatment tract.  Cycle 3 Xeloda 10/10/2016 (dose reduced due to drug induced enteritis following cycle 2)discontinued 10/15/2016 secondary to abdominal pain.  CT in while the emergency room with abdominal pain 10/15/2016 revealed descending colitis  Colonoscopy 10/30/2016 confirmed a mass at the: colo-colonicanastomosis  Cycle 4 Xeloda 11/03/2016  01/09/2017 status post a low anterior resection for removal of locally recurrent tumor at the sigmoid anastomosis, resection margins negative,  tumor invades pericolonic soft tissue, 12 benign lymph nodes;MSI stable. Foundation 1 testing- KRAS G13D, MSS, tumor mutation burden-3,BRAFwild-type  Cycle 5 Xeloda 02/12/2017  Cycle 6 Xeloda 03/12/2017, Xeloda dose reduced to 1000 mg twice daily on 03/18/2017  Cycle 7 Xeloda 04/09/2017  Cycle 8 Xeloda 05/12/2017  Restaging CTs  06/24/2017-no evidence of metastatic disease involving the chest. Post ablation changes in the liver. No new hepatic metastatic disease.  Restaging CTs 12/14/2017- stable liver ablation sites, indeterminate nodule adjacent to the descending colon, stable left renal mass, changes of early cirrhosis, enlarged porta hepatis node-likely reactive  MRI abdomen 04/22/2018- enlargement of 1.7 cm left paracolic gutter mass, stable 1 cm extrahepatic left liver capsule lesion, no evidence of local tumor recurrence at the ablation sites, no new liver lesions, stable right kidney mass  Colonoscopy 05/19/2018- mass in the rectum at the anastomotic site beginning at 3 cm from the anal verge, biopsy confirmed invasive adenocarcinoma  CT 06/07/2018-stable liver ablation sites, enlarging peritoneal lesion at the left pericolic gutter, stable nodule in the sigmoid mesocolon, stable prominent periportal lymph nodes, no evidence of metastatic disease to the chest  Radiation/Xeloda beginning 06/21/2018  Xeloda dose reduced to 1000 mg twice daily days of radiationbeginning8/16/2019 due to neutropenia  Radiation/Xeloda completed 08/02/2018  CTs 10/07/2018- new 6 mm right upper lobe nodule, stable ablation changes in the left and right liver, stable superior pole right renal mass, increased soft tissue thickening the presacral region, stable 12 mm porta hepatis node, increase in size of soft tissue nodule adjacent to the descending colon-2.1 x 2.2 cm  CTs 01/11/2019- anterior right upper lobe subpleural nodule-smaller, no suspicious lung nodules, stable ablation defects in the liver, stable right kidney mass, wall thickening at the suture line in the lower pelvis with extension to the presacral region/vaginal fornix, slight enlargement of left pericolic gutter peritoneal implant  Sigmoidoscopy 01/27/2019- mass at 5 cm from the anal verge, biopsy confirmed adenocarcinoma  Laparoscopic transverse colostomy and Port-A-Cath  placement 03/02/2019- tumor noted to be studding the descending colon  Cycle 1 FOLFOX 04/04/2019    2.History ofMicrocytic anemia-likely iron deficiency anemia secondary to colon cancer, persistent microcytic anemia;ferrous sulfate increased to twice daily 12/15/2017;hemoglobin in normal range 03/16/2018, persistent microcytosis  3.Tubular villous adenoma on the sigmoid colon resection specimen 08/09/2015  Multiple tubular adenomas removed on a colonoscopy 11/16/2015  4. Right renal mass on CT 06/04/2016-suspicious for renal cell carcinoma;stable on CT 06/24/2017;referred to urology  Stable on CT 01/11/2019  5. History ofneutropenia secondary to chemotherapy  6.Pain secondary to local recurrence of colon cancer  7.Abdominal pain. CT abdomen/pelvis 07/24/2018- inflammatory changes and mild dilatation of multiple small bowel loops in the lower abdomen and pelvis. Unchanged metastatic implant in the left paracolic gutter; unchanged periportal lymphadenopathy; hepatic steatosis; stable liver ablation site; unchanged 2.7 cm enhancing right renal mass.Resolved.  8.Severe macrocytic anemia 03/28/2019- vitamin B12 level at the low end of normal, hypersegmented neutrophils noted on peripheral blood smear, DAT positive, elevated LDH and bilirubin  Prednisone, 60 mg daily started 03/29/2019  1 unit of packed red blood cells 03/28/2019  Hemoglobin improved 03/30/2019, stable 03/31/2019  B12 initiated 03/30/2019  Hemoglobin improved 04/04/2019    Disposition: She continues to have high output from the colostomy, but none since early this morning.  We will encourage her to take scheduled Imodium and Lomotil.  She is taking iron.  We will add fiber. Ms. Boom has lost a significant amount of weight over the past week.  She will receive  intravenous fluids at the Cancer center today.  She will return for office visit as scheduled on 04/19/2019.  We will decide on proceeding  with cycle 2 FOLFOX based on her performance status tomorrow.  The prednisone will be tapered to off if the hemoglobin remains improved.  I will asked the Cancer center nutritionist to see her.  [name], MD  04/18/2019  11:57 AM

## 2019-04-18 NOTE — Patient Instructions (Signed)

## 2019-04-18 NOTE — Telephone Encounter (Signed)
Per 6/1 los F/u 6/2 as scheduled

## 2019-04-18 NOTE — Telephone Encounter (Signed)
Returned TC to pt regarding Diarrhea and needing IVF. Per Lattie Haw scheduled patient for IVF today 04/18/19 @11 :30a and she will see Dr. Benay Spice at 11:00a before getting the fluids. Patient stated that she is on her way now.

## 2019-04-19 ENCOUNTER — Inpatient Hospital Stay: Payer: Medicare Other

## 2019-04-19 ENCOUNTER — Other Ambulatory Visit: Payer: Self-pay

## 2019-04-19 ENCOUNTER — Inpatient Hospital Stay (HOSPITAL_BASED_OUTPATIENT_CLINIC_OR_DEPARTMENT_OTHER): Payer: Medicare Other | Admitting: Nurse Practitioner

## 2019-04-19 ENCOUNTER — Telehealth: Payer: Self-pay

## 2019-04-19 ENCOUNTER — Telehealth: Payer: Self-pay | Admitting: Nurse Practitioner

## 2019-04-19 ENCOUNTER — Encounter: Payer: Self-pay | Admitting: Nurse Practitioner

## 2019-04-19 VITALS — BP 95/64 | HR 68 | Temp 98.7°F | Resp 18 | Ht 67.0 in | Wt 123.7 lb

## 2019-04-19 DIAGNOSIS — R718 Other abnormality of red blood cells: Secondary | ICD-10-CM | POA: Diagnosis not present

## 2019-04-19 DIAGNOSIS — C189 Malignant neoplasm of colon, unspecified: Secondary | ICD-10-CM

## 2019-04-19 DIAGNOSIS — D509 Iron deficiency anemia, unspecified: Secondary | ICD-10-CM | POA: Diagnosis not present

## 2019-04-19 DIAGNOSIS — E860 Dehydration: Secondary | ICD-10-CM | POA: Diagnosis not present

## 2019-04-19 DIAGNOSIS — C187 Malignant neoplasm of sigmoid colon: Secondary | ICD-10-CM | POA: Diagnosis not present

## 2019-04-19 DIAGNOSIS — Z452 Encounter for adjustment and management of vascular access device: Secondary | ICD-10-CM | POA: Diagnosis not present

## 2019-04-19 DIAGNOSIS — C787 Secondary malignant neoplasm of liver and intrahepatic bile duct: Secondary | ICD-10-CM

## 2019-04-19 DIAGNOSIS — D701 Agranulocytosis secondary to cancer chemotherapy: Secondary | ICD-10-CM

## 2019-04-19 DIAGNOSIS — Z95828 Presence of other vascular implants and grafts: Secondary | ICD-10-CM | POA: Insufficient documentation

## 2019-04-19 DIAGNOSIS — R634 Abnormal weight loss: Secondary | ICD-10-CM

## 2019-04-19 LAB — CBC WITH DIFFERENTIAL (CANCER CENTER ONLY)
Abs Immature Granulocytes: 0.15 10*3/uL — ABNORMAL HIGH (ref 0.00–0.07)
Basophils Absolute: 0 10*3/uL (ref 0.0–0.1)
Basophils Relative: 1 %
Eosinophils Absolute: 0.1 10*3/uL (ref 0.0–0.5)
Eosinophils Relative: 3 %
HCT: 33.4 % — ABNORMAL LOW (ref 36.0–46.0)
Hemoglobin: 10.3 g/dL — ABNORMAL LOW (ref 12.0–15.0)
Immature Granulocytes: 7 %
Lymphocytes Relative: 19 %
Lymphs Abs: 0.4 10*3/uL — ABNORMAL LOW (ref 0.7–4.0)
MCH: 32.2 pg (ref 26.0–34.0)
MCHC: 30.8 g/dL (ref 30.0–36.0)
MCV: 104.4 fL — ABNORMAL HIGH (ref 80.0–100.0)
Monocytes Absolute: 0.5 10*3/uL (ref 0.1–1.0)
Monocytes Relative: 21 %
Neutro Abs: 1.1 10*3/uL — ABNORMAL LOW (ref 1.7–7.7)
Neutrophils Relative %: 49 %
Platelet Count: 184 10*3/uL (ref 150–400)
RBC: 3.2 MIL/uL — ABNORMAL LOW (ref 3.87–5.11)
RDW: 17.5 % — ABNORMAL HIGH (ref 11.5–15.5)
WBC Count: 2.3 10*3/uL — ABNORMAL LOW (ref 4.0–10.5)
nRBC: 0 % (ref 0.0–0.2)

## 2019-04-19 LAB — CMP (CANCER CENTER ONLY)
ALT: 7 U/L (ref 0–44)
AST: 11 U/L — ABNORMAL LOW (ref 15–41)
Albumin: 2.5 g/dL — ABNORMAL LOW (ref 3.5–5.0)
Alkaline Phosphatase: 172 U/L — ABNORMAL HIGH (ref 38–126)
Anion gap: 8 (ref 5–15)
BUN: 15 mg/dL (ref 8–23)
CO2: 22 mmol/L (ref 22–32)
Calcium: 7.9 mg/dL — ABNORMAL LOW (ref 8.9–10.3)
Chloride: 101 mmol/L (ref 98–111)
Creatinine: 0.83 mg/dL (ref 0.44–1.00)
GFR, Est AFR Am: >60 mL/min (ref >60)
GFR, Estimated: >60 mL/min (ref >60)
Glucose, Bld: 106 mg/dL — ABNORMAL HIGH (ref 70–99)
Potassium: 3.2 mmol/L — ABNORMAL LOW (ref 3.5–5.1)
Sodium: 131 mmol/L — ABNORMAL LOW (ref 135–145)
Total Bilirubin: 0.4 mg/dL (ref 0.3–1.2)
Total Protein: 5.5 g/dL — ABNORMAL LOW (ref 6.5–8.1)

## 2019-04-19 MED ORDER — OXYCODONE-ACETAMINOPHEN 5-325 MG PO TABS: 1.0000 | ORAL_TABLET | ORAL | 0 refills | Status: DC | PRN

## 2019-04-19 MED ORDER — OXYCODONE-ACETAMINOPHEN 5-325 MG PO TABS
1.0000 | ORAL_TABLET | Freq: Once | ORAL | Status: AC
  Administered 2019-04-19: 1 via ORAL

## 2019-04-19 MED ORDER — OXYCODONE-ACETAMINOPHEN 5-325 MG PO TABS
ORAL_TABLET | ORAL | Status: AC
  Filled 2019-04-19: qty 1

## 2019-04-19 MED ORDER — SODIUM CHLORIDE 0.9% FLUSH
10.0000 mL | Freq: Once | INTRAVENOUS | Status: AC
  Administered 2019-04-19: 10 mL
  Filled 2019-04-19: qty 10

## 2019-04-19 MED ORDER — HEPARIN SOD (PORK) LOCK FLUSH 100 UNIT/ML IV SOLN
250.0000 [IU] | INTRAVENOUS | Status: DC | PRN
  Filled 2019-04-19: qty 5

## 2019-04-19 NOTE — Progress Notes (Signed)
Per Lattie Haw gave patient pain medication (percocet 5-325mg  1Tab)

## 2019-04-19 NOTE — Telephone Encounter (Signed)
Scheduled appt per 6/2 los.  Added treatment to the book for approval.  Will contact patient once treatment has been added.

## 2019-04-19 NOTE — Progress Notes (Addendum)
[place] OFFICE PROGRESS NOTE   Diagnosis: Colon cancer  INTERVAL HISTORY:   Nichole Cross returns as scheduled.  She was seen yesterday with dehydration.  She received IV fluids.  She is seen today prior to proceeding with cycle 2 FOLFOX.  She overall is feeling better.  She reports emptying the ostomy bag 3 times total yesterday with mostly formed stool in the collection bag.  No nausea or vomiting.  She is tolerating fluids.  She has not taken Lomotil or Imodium since 9:00 yesterday morning.  She continues to have rectal pain.  She has not taken pain medication since sometime yesterday.  She would like to take Percocet while here.  Objective:  Vital signs in last 24 hours:  Blood pressure 95/64, pulse 68, temperature 98.7 F (37.1 C), temperature source Oral, resp. rate 18, height '5\' 7"'  (1.702 m), weight 123 lb 11.2 oz (56.1 kg), SpO2 99 %.    HEENT: Mild white coating over tongue.  Mucous membranes are moist. GI: Abdomen soft and nontender.  No hepatomegaly.  Left lower quadrant ostomy bag with a small amount of liquid stool. Vascular: Very minimal lower leg edema bilaterally. Skin: Diminished skin turgor.   Lab Results:  Lab Results  Component Value Date   WBC 2.3 (L) 04/19/2019   HGB 10.3 (L) 04/19/2019   HCT 33.4 (L) 04/19/2019   MCV 104.4 (H) 04/19/2019   PLT 184 04/19/2019   NEUTROABS 1.1 (L) 04/19/2019    Imaging:  No results found.  Medications: I have reviewed the patient's current medications.  Assessment/Plan: 1.Adenocarcinoma the distal sigmoid colon, poorly differentiated, stage II (T3 N0), status post a laparoscopic assisted sigmoid colectomy 08/09/2015  No loss of mismatch repair protein expression  Elevated preoperative CEA  Staging CT of the abdomen and pelvis 06/22/2015 with no evidence of distant metastatic disease  Mildly elevated CEA 02/23/2017And 04/17/2016  CTs chest, abdomen, and pelvis on 06/04/2016, 34m left upper  lobe nodule-no comparison available, new hypodense lesion in the left liver, solid Right renal mass  MRI 06/10/2016-2 enhancing lesions in the liver consistent with metastatic disease, segment 2 and segment 5. Solid enhancing right renal lesion consistent with a renal cell carcinoma  Radiofrequency ablation of 2 liver lesions on 07/25/2016  Cycle 1 adjuvant Xeloda 08/18/2016  Cycle 2 Xeloda 09/08/2016  CEA improved 09/26/2016  Cycle 3 Xeloda held 09/29/2016 due to unexplained abdominal pain,referred for CT  CT abdomen/pelvis 09/30/2016 with a long segment of bowel wall thickening involving the distal ileum; lesion left hepatic lobe similar to slightly smaller following ablation; lesion posterior right hepatic lobe elongated favored to represent treatment tract.  Cycle 3 Xeloda 10/10/2016 (dose reduced due to drug induced enteritis following cycle 2)discontinued 10/15/2016 secondary to abdominal pain.  CT in while the emergency room with abdominal pain 10/15/2016 revealed descending colitis  Colonoscopy 10/30/2016 confirmed a mass at the: colo-colonicanastomosis  Cycle 4 Xeloda 11/03/2016  01/09/2017 status post a low anterior resection for removal of locally recurrent tumor at the sigmoid anastomosis, resection margins negative, tumor invades pericolonic soft tissue, 12 benign lymph nodes;MSI stable. Foundation 1 testing- KRAS G13D, MSS, tumor mutation burden-3,BRAFwild-type  Cycle 5 Xeloda 02/12/2017  Cycle 6 Xeloda 03/12/2017, Xeloda dose reduced to 1000 mg twice daily on 03/18/2017  Cycle 7 Xeloda 04/09/2017  Cycle 8 Xeloda 05/12/2017  Restaging CTs 06/24/2017-no evidence of metastatic disease involving the chest. Post ablation changes in the liver. No new hepatic metastatic disease.  Restaging CTs 12/14/2017- stable liver ablation  sites, indeterminate nodule adjacent to the descending colon, stable left renal mass, changes of early cirrhosis, enlarged porta hepatis  node-likely reactive  MRI abdomen 04/22/2018- enlargement of 1.7 cm left paracolic gutter mass, stable 1 cm extrahepatic left liver capsule lesion, no evidence of local tumor recurrence at the ablation sites, no new liver lesions, stable right kidney mass  Colonoscopy 05/19/2018- mass in the rectum at the anastomotic site beginning at 3 cm from the anal verge, biopsy confirmed invasive adenocarcinoma  CT 06/07/2018-stable liver ablation sites, enlarging peritoneal lesion at the left pericolic gutter, stable nodule in the sigmoid mesocolon, stable prominent periportal lymph nodes, no evidence of metastatic disease to the chest  Radiation/Xeloda beginning 06/21/2018  Xeloda dose reduced to 1000 mg twice daily days of radiationbeginning8/16/2019 due to neutropenia  Radiation/Xeloda completed 08/02/2018  CTs 10/07/2018- new 6 mm right upper lobe nodule, stable ablation changes in the left and right liver, stable superior pole right renal mass, increased soft tissue thickening the presacral region, stable 12 mm porta hepatis node, increase in size of soft tissue nodule adjacent to the descending colon-2.1 x 2.2 cm  CTs 01/11/2019- anterior right upper lobe subpleural nodule-smaller, no suspicious lung nodules, stable ablation defects in the liver, stable right kidney mass, wall thickening at the suture line in the lower pelvis with extension to the presacral region/vaginal fornix, slight enlargement of left pericolic gutter peritoneal implant  Sigmoidoscopy 01/27/2019- mass at 5 cm from the anal verge, biopsy confirmed adenocarcinoma  Laparoscopic transverse colostomy and Port-A-Cath placement 03/02/2019- tumor noted to be studding the descending colon  Cycle 1 FOLFOX 04/04/2019    2.History ofMicrocytic anemia-likely iron deficiency anemia secondary to colon cancer, persistent microcytic anemia;ferrous sulfate increased to twice daily 12/15/2017;hemoglobin in normal range 03/16/2018, persistent  microcytosis  3.Tubular villous adenoma on the sigmoid colon resection specimen 08/09/2015  Multiple tubular adenomas removed on a colonoscopy 11/16/2015  4. Right renal mass on CT 06/04/2016-suspicious for renal cell carcinoma;stable on CT 06/24/2017;referred to urology  Stable on CT 01/11/2019  5. History ofneutropenia secondary to chemotherapy  6.Pain secondary to local recurrence of colon cancer  7.Abdominal pain. CT abdomen/pelvis 07/24/2018- inflammatory changes and mild dilatation of multiple small bowel loops in the lower abdomen and pelvis. Unchanged metastatic implant in the left paracolic gutter; unchanged periportal lymphadenopathy; hepatic steatosis; stable liver ablation site; unchanged 2.7 cm enhancing right renal mass.Resolved.  8.Severe macrocytic anemia 03/28/2019- vitamin B12 level at the low end of normal, hypersegmented neutrophils noted on peripheral blood smear, DAT positive, elevated LDH and bilirubin  Prednisone, 60 mg daily started 03/29/2019  1 unit of packed red blood cells 03/28/2019  Hemoglobin improved 03/30/2019, stable 03/31/2019  B12 initiated 03/30/2019  Hemoglobin improved 04/04/2019   Disposition: [name] appears unchanged.  She has completed 1 cycle of FOLFOX.  We reviewed the CBC from today.  She has mild neutropenia.  We are holding today's treatment and rescheduling for 1 week.  She understands to contact the office with fever, chills, other signs of infection.  She notes improvement in the ostomy output.  She was provided with instructions on how to take Imodium and Lomotil if she develops increased watery output.  She will return for lab, follow-up, FOLFOX in 1 week.  She will contact the office in the interim as outlined above or with any other problems.  Patient seen with Dr. Benay Spice.    [name] ANP/GNP-BC   04/19/2019  11:39 AM This was a shared visit with [name].  Ms.  Cureton and examined.  Her  performance status appears partially improved today.  Chemotherapy will be held secondary to neutropenia.  Julieanne Manson, MD

## 2019-04-19 NOTE — Telephone Encounter (Signed)
TC to patient per Lattie Haw to find out if she is taking potasium. Patient stated that she is taking potassium one pill a day. I let patient know that Lattie Haw would like for her to take 2 pills a day for the next 3 days and then go back to the 1 a day. Patient verbalized understanding and repeated back instructions for taking potassium. No further problems or complaints at this time.

## 2019-04-20 ENCOUNTER — Inpatient Hospital Stay: Payer: Medicare Other | Admitting: Nutrition

## 2019-04-20 NOTE — Progress Notes (Signed)
RD working remotely.  Called patient 2 times and she was not available. I have left a message with name and phone number for her to return my call.

## 2019-04-21 ENCOUNTER — Inpatient Hospital Stay: Payer: Medicare Other

## 2019-04-21 ENCOUNTER — Telehealth: Payer: Self-pay | Admitting: *Deleted

## 2019-04-21 ENCOUNTER — Other Ambulatory Visit: Payer: Self-pay

## 2019-04-21 DIAGNOSIS — D509 Iron deficiency anemia, unspecified: Secondary | ICD-10-CM | POA: Diagnosis not present

## 2019-04-21 DIAGNOSIS — C189 Malignant neoplasm of colon, unspecified: Secondary | ICD-10-CM

## 2019-04-21 DIAGNOSIS — C187 Malignant neoplasm of sigmoid colon: Secondary | ICD-10-CM | POA: Diagnosis not present

## 2019-04-21 DIAGNOSIS — E860 Dehydration: Secondary | ICD-10-CM | POA: Diagnosis not present

## 2019-04-21 DIAGNOSIS — D701 Agranulocytosis secondary to cancer chemotherapy: Secondary | ICD-10-CM | POA: Diagnosis not present

## 2019-04-21 DIAGNOSIS — Z95828 Presence of other vascular implants and grafts: Secondary | ICD-10-CM

## 2019-04-21 DIAGNOSIS — R718 Other abnormality of red blood cells: Secondary | ICD-10-CM | POA: Diagnosis not present

## 2019-04-21 DIAGNOSIS — Z452 Encounter for adjustment and management of vascular access device: Secondary | ICD-10-CM | POA: Diagnosis not present

## 2019-04-21 MED ORDER — HEPARIN SOD (PORK) LOCK FLUSH 100 UNIT/ML IV SOLN
500.0000 [IU] | Freq: Once | INTRAVENOUS | Status: AC
  Administered 2019-04-21: 11:00:00 500 [IU]
  Filled 2019-04-21: qty 5

## 2019-04-21 MED ORDER — SODIUM CHLORIDE 0.9% FLUSH
10.0000 mL | Freq: Once | INTRAVENOUS | Status: AC
  Administered 2019-04-21: 10 mL
  Filled 2019-04-21: qty 10

## 2019-04-21 NOTE — Patient Instructions (Signed)

## 2019-04-21 NOTE — Telephone Encounter (Signed)
Daughter called to inquire if OK for patient to shower with her port needle in? Informed her OK to shower, but do not let stream hit the port dressing site. Tx was canceled 6/2 and she should have had needle removed before leaving office. Will have her come in for port flush to have de-accessed today. High priority scheduling message sent.

## 2019-04-24 ENCOUNTER — Encounter (HOSPITAL_COMMUNITY): Payer: Self-pay | Admitting: Emergency Medicine

## 2019-04-24 ENCOUNTER — Other Ambulatory Visit: Payer: Self-pay

## 2019-04-24 ENCOUNTER — Inpatient Hospital Stay (HOSPITAL_COMMUNITY)
Admission: EM | Admit: 2019-04-24 | Discharge: 2019-04-30 | DRG: 689 | Disposition: A | Discharge status: Home Health | Payer: Medicare Other | Attending: Internal Medicine | Admitting: Internal Medicine

## 2019-04-24 DIAGNOSIS — N390 Urinary tract infection, site not specified: Secondary | ICD-10-CM | POA: Diagnosis not present

## 2019-04-24 DIAGNOSIS — Z96659 Presence of unspecified artificial knee joint: Secondary | ICD-10-CM | POA: Diagnosis not present

## 2019-04-24 DIAGNOSIS — Z79891 Long term (current) use of opiate analgesic: Secondary | ICD-10-CM

## 2019-04-24 DIAGNOSIS — D539 Nutritional anemia, unspecified: Secondary | ICD-10-CM | POA: Diagnosis not present

## 2019-04-24 DIAGNOSIS — Z9181 History of falling: Secondary | ICD-10-CM | POA: Diagnosis not present

## 2019-04-24 DIAGNOSIS — Z681 Body mass index (BMI) 19 or less, adult: Secondary | ICD-10-CM

## 2019-04-24 DIAGNOSIS — C641 Malignant neoplasm of right kidney, except renal pelvis: Secondary | ICD-10-CM | POA: Diagnosis present

## 2019-04-24 DIAGNOSIS — R197 Diarrhea, unspecified: Secondary | ICD-10-CM | POA: Diagnosis not present

## 2019-04-24 DIAGNOSIS — R55 Syncope and collapse: Secondary | ICD-10-CM | POA: Diagnosis not present

## 2019-04-24 DIAGNOSIS — C189 Malignant neoplasm of colon, unspecified: Secondary | ICD-10-CM | POA: Diagnosis not present

## 2019-04-24 DIAGNOSIS — E860 Dehydration: Secondary | ICD-10-CM | POA: Diagnosis present

## 2019-04-24 DIAGNOSIS — E43 Unspecified severe protein-calorie malnutrition: Secondary | ICD-10-CM | POA: Diagnosis not present

## 2019-04-24 DIAGNOSIS — Z933 Colostomy status: Secondary | ICD-10-CM

## 2019-04-24 DIAGNOSIS — Z9049 Acquired absence of other specified parts of digestive tract: Secondary | ICD-10-CM | POA: Diagnosis not present

## 2019-04-24 DIAGNOSIS — C187 Malignant neoplasm of sigmoid colon: Secondary | ICD-10-CM | POA: Diagnosis not present

## 2019-04-24 DIAGNOSIS — Z95828 Presence of other vascular implants and grafts: Secondary | ICD-10-CM

## 2019-04-24 DIAGNOSIS — R42 Dizziness and giddiness: Secondary | ICD-10-CM | POA: Diagnosis not present

## 2019-04-24 DIAGNOSIS — M810 Age-related osteoporosis without current pathological fracture: Secondary | ICD-10-CM | POA: Diagnosis present

## 2019-04-24 DIAGNOSIS — G893 Neoplasm related pain (acute) (chronic): Secondary | ICD-10-CM | POA: Diagnosis not present

## 2019-04-24 DIAGNOSIS — Z1159 Encounter for screening for other viral diseases: Secondary | ICD-10-CM | POA: Diagnosis not present

## 2019-04-24 DIAGNOSIS — D701 Agranulocytosis secondary to cancer chemotherapy: Secondary | ICD-10-CM | POA: Diagnosis present

## 2019-04-24 DIAGNOSIS — H269 Unspecified cataract: Secondary | ICD-10-CM | POA: Diagnosis present

## 2019-04-24 DIAGNOSIS — R404 Transient alteration of awareness: Secondary | ICD-10-CM | POA: Diagnosis not present

## 2019-04-24 DIAGNOSIS — E876 Hypokalemia: Secondary | ICD-10-CM | POA: Diagnosis present

## 2019-04-24 DIAGNOSIS — I82543 Chronic embolism and thrombosis of tibial vein, bilateral: Secondary | ICD-10-CM | POA: Diagnosis not present

## 2019-04-24 DIAGNOSIS — T451X5A Adverse effect of antineoplastic and immunosuppressive drugs, initial encounter: Secondary | ICD-10-CM | POA: Diagnosis not present

## 2019-04-24 DIAGNOSIS — E038 Other specified hypothyroidism: Secondary | ICD-10-CM | POA: Diagnosis not present

## 2019-04-24 DIAGNOSIS — Z923 Personal history of irradiation: Secondary | ICD-10-CM | POA: Diagnosis not present

## 2019-04-24 DIAGNOSIS — Z79899 Other long term (current) drug therapy: Secondary | ICD-10-CM | POA: Diagnosis not present

## 2019-04-24 DIAGNOSIS — E039 Hypothyroidism, unspecified: Secondary | ICD-10-CM | POA: Diagnosis present

## 2019-04-24 DIAGNOSIS — C787 Secondary malignant neoplasm of liver and intrahepatic bile duct: Secondary | ICD-10-CM | POA: Diagnosis not present

## 2019-04-24 DIAGNOSIS — I82442 Acute embolism and thrombosis of left tibial vein: Secondary | ICD-10-CM | POA: Diagnosis not present

## 2019-04-24 DIAGNOSIS — Z9221 Personal history of antineoplastic chemotherapy: Secondary | ICD-10-CM | POA: Diagnosis not present

## 2019-04-24 DIAGNOSIS — Z803 Family history of malignant neoplasm of breast: Secondary | ICD-10-CM

## 2019-04-24 DIAGNOSIS — Z993 Dependence on wheelchair: Secondary | ICD-10-CM

## 2019-04-24 DIAGNOSIS — N179 Acute kidney failure, unspecified: Secondary | ICD-10-CM | POA: Diagnosis present

## 2019-04-24 DIAGNOSIS — Z79890 Hormone replacement therapy (postmenopausal): Secondary | ICD-10-CM

## 2019-04-24 DIAGNOSIS — Z800 Family history of malignant neoplasm of digestive organs: Secondary | ICD-10-CM

## 2019-04-24 DIAGNOSIS — N2889 Other specified disorders of kidney and ureter: Secondary | ICD-10-CM | POA: Diagnosis not present

## 2019-04-24 DIAGNOSIS — Z886 Allergy status to analgesic agent status: Secondary | ICD-10-CM

## 2019-04-24 DIAGNOSIS — I82403 Acute embolism and thrombosis of unspecified deep veins of lower extremity, bilateral: Secondary | ICD-10-CM | POA: Diagnosis present

## 2019-04-24 DIAGNOSIS — R000 Tachycardia, unspecified: Secondary | ICD-10-CM | POA: Diagnosis not present

## 2019-04-24 DIAGNOSIS — Z91011 Allergy to milk products: Secondary | ICD-10-CM

## 2019-04-24 DIAGNOSIS — I82453 Acute embolism and thrombosis of peroneal vein, bilateral: Secondary | ICD-10-CM | POA: Diagnosis not present

## 2019-04-24 DIAGNOSIS — R0902 Hypoxemia: Secondary | ICD-10-CM | POA: Diagnosis not present

## 2019-04-24 LAB — COMPREHENSIVE METABOLIC PANEL
ALT: 12 U/L (ref 0–44)
AST: 20 U/L (ref 15–41)
Albumin: 2.6 g/dL — ABNORMAL LOW (ref 3.5–5.0)
Alkaline Phosphatase: 310 U/L — ABNORMAL HIGH (ref 38–126)
Anion gap: 10 (ref 5–15)
BUN: 36 mg/dL — ABNORMAL HIGH (ref 8–23)
CO2: 21 mmol/L — ABNORMAL LOW (ref 22–32)
Calcium: 8.2 mg/dL — ABNORMAL LOW (ref 8.9–10.3)
Chloride: 101 mmol/L (ref 98–111)
Creatinine, Ser: 1.07 mg/dL — ABNORMAL HIGH (ref 0.44–1.00)
GFR calc Af Amer: 59 mL/min — ABNORMAL LOW (ref >60)
GFR calc non Af Amer: 51 mL/min — ABNORMAL LOW (ref >60)
Glucose, Bld: 104 mg/dL — ABNORMAL HIGH (ref 70–99)
Potassium: 3.7 mmol/L (ref 3.5–5.1)
Sodium: 132 mmol/L — ABNORMAL LOW (ref 135–145)
Total Bilirubin: 0.5 mg/dL (ref 0.3–1.2)
Total Protein: 5.7 g/dL — ABNORMAL LOW (ref 6.5–8.1)

## 2019-04-24 LAB — URINALYSIS, ROUTINE W REFLEX MICROSCOPIC
Bilirubin Urine: NEGATIVE
Glucose, UA: NEGATIVE mg/dL
Ketones, ur: NEGATIVE mg/dL
Nitrite: NEGATIVE
Protein, ur: 30 mg/dL — AB
Specific Gravity, Urine: 1.013 (ref 1.005–1.030)
pH: 9 — ABNORMAL HIGH (ref 5.0–8.0)

## 2019-04-24 LAB — CBC WITH DIFFERENTIAL/PLATELET
Abs Immature Granulocytes: 1.12 10*3/uL — ABNORMAL HIGH (ref 0.00–0.07)
Basophils Absolute: 0.1 10*3/uL (ref 0.0–0.1)
Basophils Relative: 1 %
Eosinophils Absolute: 0 10*3/uL (ref 0.0–0.5)
Eosinophils Relative: 0 %
HCT: 35.9 % — ABNORMAL LOW (ref 36.0–46.0)
Hemoglobin: 11.3 g/dL — ABNORMAL LOW (ref 12.0–15.0)
Immature Granulocytes: 13 %
Lymphocytes Relative: 7 %
Lymphs Abs: 0.6 10*3/uL — ABNORMAL LOW (ref 0.7–4.0)
MCH: 32.2 pg (ref 26.0–34.0)
MCHC: 31.5 g/dL (ref 30.0–36.0)
MCV: 102.3 fL — ABNORMAL HIGH (ref 80.0–100.0)
Monocytes Absolute: 0.7 10*3/uL (ref 0.1–1.0)
Monocytes Relative: 8 %
Neutro Abs: 6 10*3/uL (ref 1.7–7.7)
Neutrophils Relative %: 71 %
Platelets: 206 10*3/uL (ref 150–400)
RBC: 3.51 MIL/uL — ABNORMAL LOW (ref 3.87–5.11)
RDW: 17.8 % — ABNORMAL HIGH (ref 11.5–15.5)
WBC: 8.5 10*3/uL (ref 4.0–10.5)
nRBC: 0 % (ref 0.0–0.2)

## 2019-04-24 LAB — CBC
HCT: 31.4 % — ABNORMAL LOW (ref 36.0–46.0)
Hemoglobin: 9.6 g/dL — ABNORMAL LOW (ref 12.0–15.0)
MCH: 31.6 pg (ref 26.0–34.0)
MCHC: 30.6 g/dL (ref 30.0–36.0)
MCV: 103.3 fL — ABNORMAL HIGH (ref 80.0–100.0)
Platelets: 173 10*3/uL (ref 150–400)
RBC: 3.04 MIL/uL — ABNORMAL LOW (ref 3.87–5.11)
RDW: 17.5 % — ABNORMAL HIGH (ref 11.5–15.5)
WBC: 6.7 10*3/uL (ref 4.0–10.5)
nRBC: 0 % (ref 0.0–0.2)

## 2019-04-24 LAB — CORTISOL: Cortisol, Plasma: 18.4 ug/dL

## 2019-04-24 LAB — CREATININE, SERUM
Creatinine, Ser: 0.74 mg/dL (ref 0.44–1.00)
GFR calc Af Amer: >60 mL/min (ref >60)
GFR calc non Af Amer: >60 mL/min (ref >60)

## 2019-04-24 LAB — TROPONIN I: Troponin I: <0.03 ng/mL (ref <0.03)

## 2019-04-24 LAB — HEMOGLOBIN A1C
Hgb A1c MFr Bld: 3.8 % — ABNORMAL LOW (ref 4.8–5.6)
Mean Plasma Glucose: 62.36 mg/dL

## 2019-04-24 LAB — TSH: TSH: 11.458 u[IU]/mL — ABNORMAL HIGH (ref 0.350–4.500)

## 2019-04-24 LAB — SARS CORONAVIRUS 2 BY RT PCR (HOSPITAL ORDER, PERFORMED IN ~~LOC~~ HOSPITAL LAB): SARS Coronavirus 2: NEGATIVE

## 2019-04-24 LAB — VITAMIN B12: Vitamin B-12: 1423 pg/mL — ABNORMAL HIGH (ref 180–914)

## 2019-04-24 LAB — FOLATE: Folate: 18.1 ng/mL (ref >5.9)

## 2019-04-24 MED ORDER — STERILE WATER FOR INJECTION IJ SOLN
INTRAMUSCULAR | Status: AC
  Filled 2019-04-24: qty 10

## 2019-04-24 MED ORDER — SODIUM CHLORIDE 0.9 % IV SOLN
1.0000 g | INTRAVENOUS | Status: DC
  Administered 2019-04-24 – 2019-04-26 (×3): 1 g via INTRAVENOUS
  Filled 2019-04-24: qty 1
  Filled 2019-04-24: qty 10
  Filled 2019-04-24 (×2): qty 1

## 2019-04-24 MED ORDER — ACETAMINOPHEN 325 MG PO TABS
650.0000 mg | ORAL_TABLET | Freq: Four times a day (QID) | ORAL | Status: DC | PRN
  Administered 2019-04-27 – 2019-04-29 (×2): 650 mg via ORAL
  Filled 2019-04-24: qty 2

## 2019-04-24 MED ORDER — LOPERAMIDE HCL 2 MG PO CAPS: 4.0000 mg | ORAL_CAPSULE | Freq: Three times a day (TID) | ORAL | Status: DC | PRN

## 2019-04-24 MED ORDER — ENOXAPARIN SODIUM 40 MG/0.4ML ~~LOC~~ SOLN
40.0000 mg | SUBCUTANEOUS | Status: DC
  Administered 2019-04-24: 40 mg via SUBCUTANEOUS
  Filled 2019-04-24: qty 0.4

## 2019-04-24 MED ORDER — LEVOTHYROXINE SODIUM 150 MCG PO TABS
150.0000 ug | ORAL_TABLET | Freq: Every day | ORAL | Status: DC
  Administered 2019-04-24 – 2019-04-30 (×6): 150 ug via ORAL
  Filled 2019-04-24 (×2): qty 1
  Filled 2019-04-24 (×3): qty 3
  Filled 2019-04-24: qty 1
  Filled 2019-04-24: qty 3
  Filled 2019-04-24 (×4): qty 1
  Filled 2019-04-24 (×2): qty 3

## 2019-04-24 MED ORDER — ENSURE ENLIVE PO LIQD
237.0000 mL | Freq: Two times a day (BID) | ORAL | Status: DC
  Administered 2019-04-25 – 2019-04-29 (×5): 237 mL via ORAL

## 2019-04-24 MED ORDER — ALTEPLASE 2 MG IJ SOLR
2.0000 mg | Freq: Once | INTRAMUSCULAR | Status: AC
  Administered 2019-04-24: 06:00:00 2 mg
  Filled 2019-04-24: qty 2

## 2019-04-24 MED ORDER — SODIUM CHLORIDE 0.9% FLUSH
10.0000 mL | INTRAVENOUS | Status: DC | PRN
  Administered 2019-04-27: 10 mL
  Filled 2019-04-24: qty 40

## 2019-04-24 MED ORDER — FERROUS SULFATE 325 (65 FE) MG PO TABS
325.0000 mg | ORAL_TABLET | Freq: Every day | ORAL | Status: DC
  Administered 2019-04-24 – 2019-04-30 (×7): 325 mg via ORAL
  Filled 2019-04-24 (×7): qty 1

## 2019-04-24 MED ORDER — SODIUM CHLORIDE 0.9 % IV BOLUS
1000.0000 mL | Freq: Once | INTRAVENOUS | Status: AC
  Administered 2019-04-24: 1000 mL via INTRAVENOUS

## 2019-04-24 MED ORDER — SODIUM CHLORIDE 0.9% FLUSH
10.0000 mL | Freq: Two times a day (BID) | INTRAVENOUS | Status: DC
  Administered 2019-04-25 – 2019-04-28 (×5): 10 mL

## 2019-04-24 MED ORDER — VITAMIN B-12 1000 MCG PO TABS
1000.0000 ug | ORAL_TABLET | Freq: Every day | ORAL | Status: DC
  Administered 2019-04-24 – 2019-04-30 (×7): 1000 ug via ORAL
  Filled 2019-04-24 (×7): qty 1

## 2019-04-24 MED ORDER — SODIUM CHLORIDE 0.9 % IV SOLN
INTRAVENOUS | Status: DC
  Administered 2019-04-24 – 2019-04-27 (×8): via INTRAVENOUS

## 2019-04-24 MED ORDER — ACETAMINOPHEN 650 MG RE SUPP: 650.0000 mg | Freq: Four times a day (QID) | RECTAL | Status: DC | PRN

## 2019-04-24 MED ORDER — OXYCODONE-ACETAMINOPHEN 5-325 MG PO TABS
1.0000 | ORAL_TABLET | ORAL | Status: DC | PRN
  Administered 2019-04-24: 2 via ORAL
  Administered 2019-04-24: 1 via ORAL
  Administered 2019-04-25 – 2019-04-28 (×13): 2 via ORAL
  Administered 2019-04-29: 1 via ORAL
  Administered 2019-04-29 – 2019-04-30 (×3): 2 via ORAL
  Filled 2019-04-24 (×3): qty 2
  Filled 2019-04-24: qty 1
  Filled 2019-04-24 (×16): qty 2

## 2019-04-24 MED ORDER — TEMAZEPAM 15 MG PO CAPS
15.0000 mg | ORAL_CAPSULE | Freq: Every evening | ORAL | Status: DC | PRN
  Administered 2019-04-24: 23:00:00 15 mg via ORAL
  Filled 2019-04-24 (×2): qty 1

## 2019-04-24 MED ORDER — PREDNISONE 20 MG PO TABS
20.0000 mg | ORAL_TABLET | Freq: Every day | ORAL | Status: DC
  Administered 2019-04-24 – 2019-04-26 (×3): 20 mg via ORAL
  Filled 2019-04-24 (×3): qty 1

## 2019-04-24 MED ORDER — SODIUM CHLORIDE 0.9 % IV BOLUS
1000.0000 mL | Freq: Once | INTRAVENOUS | Status: AC
  Administered 2019-04-24: 11:00:00 1000 mL via INTRAVENOUS

## 2019-04-24 MED ORDER — ALTEPLASE 2 MG IJ SOLR
2.0000 mg | Freq: Once | INTRAMUSCULAR | Status: DC
  Filled 2019-04-24: qty 2

## 2019-04-24 MED ORDER — PROCHLORPERAZINE EDISYLATE 10 MG/2ML IJ SOLN: 10.0000 mg | INTRAMUSCULAR | Status: DC | PRN

## 2019-04-24 MED ORDER — POLYETHYLENE GLYCOL 3350 17 G PO PACK: 17.0000 g | PACK | Freq: Every day | ORAL | Status: DC | PRN

## 2019-04-24 MED ORDER — STERILE WATER FOR INJECTION IJ SOLN: 5.0000 mL | Freq: Once | INTRAMUSCULAR | Status: DC

## 2019-04-24 MED ORDER — HYDROCORTISONE NA SUCCINATE PF 100 MG IJ SOLR
100.0000 mg | Freq: Once | INTRAMUSCULAR | Status: AC
  Administered 2019-04-24: 100 mg via INTRAVENOUS
  Filled 2019-04-24: qty 2

## 2019-04-24 NOTE — H&P (Signed)
History and Physical        Hospital Admission Note Date: 04/24/2019  Patient name: Nichole Cross Medical record number: [id number] Date of birth: 09-Nov-1941 Age: 78 y.o. Gender: female  PCP: [name], MD  Outpatient oncologist: Dr. Learta Codding  Patient coming from: Home  I have reviewed all records in the [place].    Chief Complaint:  Dizziness, passed out at home  HPI: Patient is a 78 year old female with a history of anal carcinoma of distal sigmoid colon, undergoing chemotherapy post transverse colostomy and Port-A-Cath placement in 02/2019, started chemotherapy FOLFOX cycle 1 on 04/04/2019, macrocytic anemia presented to ED with dizziness and near syncopal episode this morning in the bathroom.  Patient reports that she has not been eating and drinking well in the last 2 to 3 days due to loss of appetite.  She denied any fever chills abdominal pain, vomiting or diarrhea. She had issues with high output ostomy in the last 1 week. In ED, patient received 2 L IV fluid boluses, however SBP still in 80s.  [facility] hospitalist service was consulted for admission.  COVID test pending  ED work-up/course:  In ED temp 98, respiratory rate 13, heart rate 96, BP on arrival 84/63, O2 sats 100% on room air Sodium 132, potassium 3.7, BUN 36, creatinine 1.0 WBCs 8.5, hemoglobin 11.3 UA positive for UTI  Review of Systems: Positives marked in 'bold' Constitutional: Denies fever, chills, diaphoresis, poor appetite and fatigue.  HEENT: Denies photophobia, eye pain, redness, hearing loss, ear pain, congestion, sore throat, rhinorrhea, sneezing, mouth sores, trouble swallowing, neck pain, neck stiffness and tinnitus.   Respiratory: Denies SOB, DOE, cough, chest tightness,  and wheezing.   Cardiovascular: Denies chest pain, palpitations and leg swelling.  Gastrointestinal: Denies  nausea, vomiting, abdominal pain, blood in stool and abdominal distention.  Genitourinary: Denies dysuria, urgency, frequency, hematuria, flank pain and difficulty urinating.  Musculoskeletal: Denies myalgias, back pain, joint swelling, arthralgias and gait problem.  Skin: Denies pallor, rash and wound.  Neurological: Please see HPI Hematological: Denies adenopathy. Easy bruising, personal or family bleeding history  Psychiatric/Behavioral: Denies suicidal ideation, mood changes, confusion, nervousness, sleep disturbance and agitation  Past Medical History: Past Medical History:  Diagnosis Date  . Anemia   . Arthritis    knees  . Blood dyscrasia 2016   microcytic anemia, likely iron deficiency anemia secondary to colon cancer  . Cataracts, bilateral   . Colon cancer ([place]) 06/2015   tubular villous adenoma on sigmoid colon specimen (08/09/15), myltiple tubular adenomas removed on a colonoscopy  11/16/15  . Complication of anesthesia   . Fatty liver   . Headache(784.0)    migraines  . Hypothyroidism   . Liver metastasis ([place])   . Metastasis to kidney ([place])   . Osteoporosis   . PONV (postoperative nausea and vomiting)    NAUSEA  . Renal carcinoma ([place]) 06/2016   ? primary  . Secondary liver cancer ([place]) 06/2016   ? metastasis from colon    Past Surgical History:  Procedure Laterality Date  . ABDOMINAL HYSTERECTOMY  1993   with bilateral BSO  . abdominal tumor  1993  . APPENDECTOMY    .  COLON RESECTION N/A 03/02/2019   Procedure: LAPAROSCOPY COLOSTOMY CREATION, SMALL BOWEL RESECTION, BLADDER REPAIR;  Surgeon: Leighton Ruff, MD;  Location: [facility];  Service: General;  Laterality: N/A;  . COLON SURGERY     partial colectomy  . IR GENERIC HISTORICAL  07/01/2016   IR RADIOLOGIST EVAL & MGMT 07/01/2016 Greggory Keen, MD [facility] INTERV RAD  . IR GENERIC HISTORICAL  08/14/2016   IR RADIOLOGIST EVAL & MGMT 08/14/2016 Jacqulynn Cadet, MD [facility] INTER[facility] RAD  . IR RADIOLOGIST EVAL & MGMT   12/24/2017  . IR RADIOLOGIST EVAL & MGMT  04/22/2018  . LAPAROSCOPIC PARTIAL COLECTOMY N/A 08/09/2015   Procedure: LAPAROSCOPIC PARTIAL COLECTOMY;  Surgeon: Jackolyn Confer, MD;  [facility];  Service: General;  Laterality: N/A;  . PARTIAL KNEE ARTHROPLASTY  09/09/2012   Procedure: UNICOMPARTMENTAL KNEE;  Surgeon: Mauri Pole, MD;  [facility];  Service: Orthopedics;  Laterality: Left;  . PORTACATH PLACEMENT Right 03/02/2019   Procedure: INSERTION PORT-A-CATH WITH [place];  Surgeon: Leighton Ruff, MD;  [facility];  Service: General;  Laterality: Right;  . [place] LIVER TUMOR  2018  . XI ROBOTIC ASSISTED LOWER ANTERIOR RESECTION N/A 01/09/2017   Procedure: XI ROBOTIC ASSISTED LOWER ANTERIOR RESECTION;  Surgeon: Leighton Ruff, MD;  [facility];  Service: General;  Laterality: N/A;    Medications: Prior to Admission medications   Medication Sig Start Date End Date Taking? Authorizing Provider  diphenoxylate-atropine (LOMOTIL) 2.5-0.025 MG tablet Take 1-2 tablets by mouth 4 (four) times daily as needed for diarrhea or loose stools. 03/31/19  Yes Owens Shark, NP  Ensure (ENSURE) Take 237 mLs by mouth 2 (two) times daily.   Yes [provider]  ferrous sulfate 325 (65 FE) MG EC tablet Take 325 mg by mouth daily.    Yes [provider]  levothyroxine (SYNTHROID, LEVOTHROID) 150 MCG tablet Take 1 tablet (150 mcg total) by mouth daily. 02/25/18  Yes Burns, Claudina Lick, MD  loperamide (IMODIUM) 2 MG capsule Take 4 mg by mouth as needed for diarrhea or loose stools.    Yes [provider]  oxyCODONE-acetaminophen (PERCOCET) 5-325 MG tablet Take 1-2 tablets by mouth every 4 (four) hours as needed for severe pain. 04/19/19  Yes Owens Shark, NP  polyethylene glycol (MIRALAX / GLYCOLAX) packet Take 17 g by mouth daily as needed for mild constipation.    Yes [provider]  potassium chloride SA (K-DUR) 20 MEQ tablet Take 1 tablet (20 mEq total)  by mouth daily. 04/04/19  Yes Owens Shark, NP  predniSONE (DELTASONE) 20 MG tablet Take 3 tablets (60 mg total) by mouth daily with breakfast. Patient taking differently: Take 20 mg by mouth daily with breakfast.  03/29/19  Yes Owens Shark, NP  prochlorperazine (COMPAZINE) 10 MG tablet Take 1 tablet (10 mg total) by mouth every 6 (six) hours as needed for nausea or vomiting. 03/30/19  Yes Ladell Pier, MD  temazepam (RESTORIL) 15 MG capsule Take 1 capsule (15 mg total) by mouth at bedtime as needed for sleep. 04/18/19  Yes Ladell Pier, MD  traMADol (ULTRAM) 50 MG tablet Take 1 tablet (50 mg total) by mouth every 6 (six) hours as needed. Patient taking differently: Take 50 mg by mouth every 6 (six) hours as needed (knee pain).  12/14/18  Yes Owens Shark, NP  vitamin B-12 (CYANOCOBALAMIN) 1000 MCG tablet Take 1 tablet (1,000 mcg total) by mouth daily. 03/30/19  Yes Ladell Pier,  MD    Allergies:   Allergies  Allergen Reactions  . Lactose Intolerance (Gi) Diarrhea    gas  . Aspirin Itching  . Morphine And Related Rash    "veins popped up and they gave me benadryl    Social History:  reports that she has never smoked. She has never used smokeless tobacco. She reports that she does not drink alcohol or use drugs.  Family History: Family History  Problem Relation Age of Onset  . Pancreatic cancer Mother   . Parkinson's disease Mother   . Breast cancer Sister   . Parkinson's disease Brother   . Parkinson's disease Brother   . Colon cancer Neg Hx   . Esophageal cancer Neg Hx   . Liver cancer Neg Hx   . Rectal cancer Neg Hx   . Stomach cancer Neg Hx     Physical Exam: Blood pressure 100/71, pulse 73, temperature 98 F (36.7 C), temperature source Oral, resp. rate (!) 9, height 5\' 7"  (1.702 m), weight 56.1 kg, SpO2 100 %. General: Alert, awake, oriented x3, in no acute distress. Eyes: pink conjunctiva,anicteric sclera, pupils equal and reactive to light and  accomodation, HEENT: normocephalic, atraumatic, oropharynx clear, dry mucosal membranes Neck: supple, no masses or lymphadenopathy, no goiter, no bruits, no JVD CVS: Regular rate and rhythm, without murmurs, rubs or gallops. No lower extremity edema Resp : Clear to auscultation bilaterally, no wheezing, rales or rhonchi. GI : Soft, nontender, nondistended, positive bowel sounds, left lower quadrant ostomy bag with small amount of liquid stool Musculoskeletal: No clubbing or cyanosis, positive pedal pulses. No contracture. ROM intact  Neuro: Grossly intact, no focal neurological deficits, strength 5/5 upper and lower extremities bilaterally Psych: alert and oriented x 3, normal mood and affect Skin: Diminished skin turgor   LABS on Admission: I have personally reviewed all the labs and imagings below    Basic Metabolic Panel: Recent Labs  Lab 04/19/19 1046 04/24/19 0509  NA 131* 132*  K 3.2* 3.7  CL 101 101  CO2 22 21*  GLUCOSE 106* 104*  BUN 15 36*  CREATININE 0.83 1.07*  CALCIUM 7.9* 8.2*   Liver Function Tests: Recent Labs  Lab 04/19/19 1046 04/24/19 0509  AST 11* 20  ALT 7 12  ALKPHOS 172* 310*  BILITOT 0.4 0.5  PROT 5.5* 5.7*  ALBUMIN 2.5* 2.6*   No results for input(s): LIPASE, AMYLASE in the last 168 hours. No results for input(s): AMMONIA in the last 168 hours. CBC: Recent Labs  Lab 04/19/19 1046 04/24/19 0509  WBC 2.3* 8.5  NEUTROABS 1.1* 6.0  HGB 10.3* 11.3*  HCT 33.4* 35.9*  MCV 104.4* 102.3*  PLT 184 206   Cardiac Enzymes: No results for input(s): CKTOTAL, CKMB, CKMBINDEX, TROPONINI in the last 168 hours. BNP: Invalid input(s): POCBNP CBG: No results for input(s): GLUCAP in the last 168 hours.  Radiological Exams on Admission:  No results found.    EKG: Independently reviewed.  Rate 94, prolonged QTC 546   Assessment/Plan Principal Problem: Near Syncope: Likely due to hypotension, dehydration, UTI, poor oral intake in the last 2 to 3  days -No cardiac symptoms, no chest pain shortness of breath or any palpitations. -Stat orthostatic vitals showed BP 86/67 on standing despite 2 L of IV fluid boluses, obtain random cortisol level, B12 - patient is on low-dose prednisone daily, will give 1 dose of IV Solu-Cortef 100 mg.  Continue oral prednisone, IV fluids -Dietitian consult, placed on nutritional supplements, encouraged diet -  PT OT evaluation prior to DC, ambulates with a walker, lives at home with daughter  Active Problems:    Acute lower UTI -Follow urine culture and sensitivities, continue IV Rocephin    Hypothyroidism -Continue Synthroid, will check TSH    Adenocarcinoma of colon metastatic to liver [facility]) - undergoing chemotherapy post transverse colostomy and Port-A-Cath placement in 02/2019, started chemotherapy FOLFOX cycle 1 on 04/04/2019 -Follow ostomy output    Dehydration -Continue IV fluid hydration  Macrocytic anemia -Follow B12, folate, continue B12 1000 mcg daily   DVT prophylaxis: Lovenox CODE STATUS: Full CODE STATUS  Consults called: None  Family Communication: Admission, patients condition and plan of care including tests being ordered have been discussed with the patient  who indicates understanding and agree with the plan and Code Status  Admission status: Observation, telemetry  Disposition plan: Further plan will depend as patient's clinical course evolves and further radiologic and laboratory data become available.    At the time of admission, it appears that the appropriate admission status for this patient is observation. This is judged to be reasonable and necessary in order to provide the required intensity of service to ensure the patient's safety given the presenting symptoms, orthostatic, persistent hypotensive, near syncope, dehydration, physical exam findings, and initial radiographic and laboratory data in the context of their chronic comorbidities.  The medical decision  making on this patient was of high complexity and the patient is at high risk for clinical deterioration, therefore this is a level 3 visit.   Time Spent on Admission: 60 minutes    Nasif Bos M.D. [facility] 04/24/2019, 10:30 AM

## 2019-04-24 NOTE — ED Notes (Signed)
Bed: WA07 Expected date:  Expected time:  Means of arrival:  Comments: EMS 78 yo female near syncope/cancer patient-chemo-diarrhea CBG 130

## 2019-04-24 NOTE — ED Provider Notes (Addendum)
[place] DEPT Provider Note   CSN: [id number] Arrival date & time: 04/24/19  0417    History   Chief Complaint Chief Complaint  Patient presents with  . Diarrhea    HPI Nichole Cross is a 78 y.o. female.     HPI  This is a 78 year old female with a history of metastatic colon cancer undergoing chemotherapy who presents with dizziness.  Patient reports near syncopal episode just prior to arrival.  She states that she felt very dizzy when going to the bathroom.  She did not lose consciousness or fall.  She is currently undergoing chemotherapy and has not been eating or drinking well.  She reports multiple episodes of diarrhea over the last several days.  No vomiting.  She denies significant abdominal pain.  She denies any recent infectious symptoms including fever or cough.  No shortness of breath or chest pain.  Patient denies room spinning dizziness.  She denies any focal weakness or numbness.  Patient chart reviewed.  She has previously received fluids in clinic for episodes of hypotension.  Her baseline blood pressure is approximately 035 systolic.  Past Medical History:  Diagnosis Date  . Anemia   . Arthritis    knees  . Blood dyscrasia 2016   microcytic anemia, likely iron deficiency anemia secondary to colon cancer  . Cataracts, bilateral   . Colon cancer ([place]) 06/2015   tubular villous adenoma on sigmoid colon specimen (08/09/15), myltiple tubular adenomas removed on a colonoscopy  11/16/15  . Complication of anesthesia   . Fatty liver   . Headache(784.0)    migraines  . Hypothyroidism   . Liver metastasis ([place])   . Metastasis to kidney ([place])   . Osteoporosis   . PONV (postoperative nausea and vomiting)    NAUSEA  . Renal carcinoma ([place]) 06/2016   ? primary  . Secondary liver cancer ([place]) 06/2016   ? metastasis from colon    Patient Active Problem List   Diagnosis Date Noted  . Port-A-Cath in place 04/19/2019  . Goals of  care, counseling/discussion 03/22/2019  . Local recurrence of rectal cancer  ([place]) 01/09/2017  . Adenocarcinoma of colon metastatic to liver ([place]) 07/25/2016  . Osteopenia, h/o OP 06/07/2016  . Anemia, iron deficiency 06/07/2016  . Sigmoid colon cancer s/p lap assisted sigmoid colectomy 08/09/15 08/09/2015  . Overweight (BMI 25.0-29.9) 09/10/2012  . S/P left UKR 09/09/2012  . ELEVATED BLOOD PRESSURE WITHOUT DIAGNOSIS OF HYPERTENSION 09/30/2010  . PHARYNGITIS 02/17/2008  . GANGLION OF TENDON SHEATH 12/20/2007  . FATTY LIVER DISEASE 08/13/2007  . Hypothyroidism 08/02/2007  . CYSTITIS 08/02/2007  . Osteoarthritis 08/02/2007    Past Surgical History:  Procedure Laterality Date  . ABDOMINAL HYSTERECTOMY  1993   with bilateral BSO  . abdominal tumor  1993  . APPENDECTOMY    . COLON RESECTION N/A 03/02/2019   Procedure: LAPAROSCOPY COLOSTOMY CREATION, SMALL BOWEL RESECTION, BLADDER REPAIR;  Surgeon: Leighton Ruff, MD;  [facility];  Service: General;  Laterality: N/A;  . COLON SURGERY     partial colectomy  . IR GENERIC HISTORICAL  07/01/2016   IR RADIOLOGIST EVAL & MGMT 07/01/2016 Greggory Keen, MD [facility] INTERV RAD  . IR GENERIC HISTORICAL  08/14/2016   IR RADIOLOGIST EVAL & MGMT 08/14/2016 Jacqulynn Cadet, MD [facility] INTERV RAD  . IR RADIOLOGIST EVAL & MGMT  12/24/2017  . IR RADIOLOGIST EVAL & MGMT  04/22/2018  . LAPAROSCOPIC PARTIAL COLECTOMY N/A 08/09/2015   Procedure: LAPAROSCOPIC PARTIAL  COLECTOMY;  Surgeon: Jackolyn Confer, MD;  [facility];  Service: General;  Laterality: N/A;  . PARTIAL KNEE ARTHROPLASTY  09/09/2012   Procedure: UNICOMPARTMENTAL KNEE;  Surgeon: Mauri Pole, MD;  [facility];  Service: Orthopedics;  Laterality: Left;  . PORTACATH PLACEMENT Right 03/02/2019   Procedure: INSERTION PORT-A-CATH WITH [place];  Surgeon: Leighton Ruff, MD;  [facility];  Service: General;  Laterality: Right;  . [place] LIVER TUMOR  2018  . XI ROBOTIC  ASSISTED LOWER ANTERIOR RESECTION N/A 01/09/2017   Procedure: XI ROBOTIC ASSISTED LOWER ANTERIOR RESECTION;  Surgeon: Leighton Ruff, MD;  [facility];  Service: General;  Laterality: N/A;     OB History   No obstetric history on file.      Home Medications    Prior to Admission medications   Medication Sig Start Date End Date Taking? Authorizing Provider  diphenoxylate-atropine (LOMOTIL) 2.5-0.025 MG tablet Take 1-2 tablets by mouth 4 (four) times daily as needed for diarrhea or loose stools. 03/31/19   Owens Shark, NP  Ensure (ENSURE) Take 237 mLs by mouth 2 (two) times daily.    [provider]  ferrous sulfate 325 (65 FE) MG EC tablet Take 325 mg by mouth daily.     [provider]  levothyroxine (SYNTHROID, LEVOTHROID) 150 MCG tablet Take 1 tablet (150 mcg total) by mouth daily. 02/25/18   [name], MD  loperamide (IMODIUM) 2 MG capsule Take 4 mg by mouth as needed for diarrhea or loose stools.     [provider]  oxyCODONE-acetaminophen (PERCOCET) 5-325 MG tablet Take 1-2 tablets by mouth every 4 (four) hours as needed for severe pain. 04/19/19   Owens Shark, NP  polyethylene glycol [facility] / GLYCOLAX) packet Take 17 g by mouth daily as needed for mild constipation.     [provider]  potassium chloride SA (K-DUR) 20 MEQ tablet Take 1 tablet (20 mEq total) by mouth daily. 04/04/19   Owens Shark, NP  predniSONE (DELTASONE) 20 MG tablet Take 3 tablets (60 mg total) by mouth daily with breakfast. Patient taking differently: Take 20 mg by mouth daily with breakfast.  03/29/19   Owens Shark, NP  prochlorperazine (COMPAZINE) 10 MG tablet Take 1 tablet (10 mg total) by mouth every 6 (six) hours as needed for nausea or vomiting. 03/30/19   Ladell Pier, MD  temazepam (RESTORIL) 15 MG capsule Take 1 capsule (15 mg total) by mouth at bedtime as needed for sleep. 04/18/19   Ladell Pier, MD  traMADol (ULTRAM) 50 MG tablet Take 1 tablet  (50 mg total) by mouth every 6 (six) hours as needed. Patient taking differently: Take 50 mg by mouth every 6 (six) hours as needed (knee pain).  12/14/18   Owens Shark, NP  vitamin B-12 (CYANOCOBALAMIN) 1000 MCG tablet Take 1 tablet (1,000 mcg total) by mouth daily. 03/30/19   Ladell Pier, MD    Family History Family History  Problem Relation Age of Onset  . Pancreatic cancer Mother   . Parkinson's disease Mother   . Breast cancer Sister   . Parkinson's disease Brother   . Parkinson's disease Brother   . Colon cancer Neg Hx   . Esophageal cancer Neg Hx   . Liver cancer Neg Hx   . Rectal cancer Neg Hx   . Stomach cancer Neg Hx     Social History Social History   Tobacco Use  . Smoking  status: Never Smoker  . Smokeless tobacco: Never Used  Substance Use Topics  . Alcohol use: No  . Drug use: No     Allergies   Lactose intolerance (gi); Aspirin; and Morphine and related   Review of Systems Review of Systems  Respiratory: Negative for shortness of breath.   Cardiovascular: Negative for chest pain.  Gastrointestinal: Positive for diarrhea. Negative for abdominal pain, nausea and vomiting.  Genitourinary: Negative for dysuria.  Neurological: Positive for dizziness. Negative for weakness and numbness.  All other systems reviewed and are negative.    Physical Exam Updated Vital Signs BP 95/69   Pulse 86   Temp 98 F (36.7 C) (Oral)   Resp 14   Ht 1.702 m (5\' 7" )   Wt 56.1 kg   SpO2 100%   BMI 19.37 kg/m   Physical Exam Vitals signs and nursing note reviewed.  Constitutional:      Appearance: She is well-developed.     Comments: Chronically ill-appearing but nontoxic  HENT:     Head: Normocephalic and atraumatic.     Mouth/Throat:     Mouth: Mucous membranes are dry.  Eyes:     Pupils: Pupils are equal, round, and reactive to light.  Neck:     Musculoskeletal: Neck supple.  Cardiovascular:     Rate and Rhythm: Normal rate and regular rhythm.      Heart sounds: Normal heart sounds.     Comments: Port right upper chest Pulmonary:     Effort: Pulmonary effort is normal. No respiratory distress.     Breath sounds: No wheezing.  Abdominal:     General: Bowel sounds are normal. There is no distension.     Palpations: Abdomen is soft.     Tenderness: There is no abdominal tenderness.     Comments: Ostomy with soft stool  Musculoskeletal:     Right lower leg: No edema.     Left lower leg: No edema.  Skin:    General: Skin is warm and dry.  Neurological:     Mental Status: She is alert and oriented to person, place, and time.     Comments: Cranial nerves II through XII intact, speech fluent, 5 out of 5 strength in all 4 extremities  Psychiatric:     Comments: Flat affect      ED Treatments / Results  Labs (all labs ordered are listed, but only abnormal results are displayed) Labs Reviewed  CBC WITH DIFFERENTIAL/PLATELET - Abnormal; Notable for the following components:      Result Value   RBC 3.51 (*)    Hemoglobin 11.3 (*)    HCT 35.9 (*)    MCV 102.3 (*)    RDW 17.8 (*)    Lymphs Abs 0.6 (*)    Abs Immature Granulocytes 1.12 (*)    All other components within normal limits  COMPREHENSIVE METABOLIC PANEL - Abnormal; Notable for the following components:   Sodium 132 (*)    CO2 21 (*)    Glucose, Bld 104 (*)    BUN 36 (*)    Creatinine, Ser 1.07 (*)    Calcium 8.2 (*)    Total Protein 5.7 (*)    Albumin 2.6 (*)    Alkaline Phosphatase 310 (*)    GFR calc non Af Amer 51 (*)    GFR calc Af Amer 59 (*)    All other components within normal limits  URINALYSIS, ROUTINE W REFLEX MICROSCOPIC    EKG EKG Interpretation  Date/Time:  Sunday April 24 2019 04:40:04 EDT Ventricular Rate:  94 PR Interval:    QRS Duration: 58 QT Interval:  436 QTC Calculation: 546 R Axis:   67 Text Interpretation:  Sinus rhythm Borderline repolarization abnormality Prolonged QT interval Confirmed by Thayer Jew [phone]) on 04/24/2019  5:14:56 AM   Radiology No results found.  Procedures Procedures (including critical care time)  Medications Ordered in ED Medications  sodium chloride flush (NS) 0.9 % injection 10-40 mL (has no administration in time range)  sodium chloride flush (NS) 0.9 % injection 10-40 mL (has no administration in time range)  sterile water (preservative free) injection 5 mL (has no administration in time range)  sodium chloride 0.9 % bolus 1,000 mL (1,000 mLs Intravenous New Bag/Given 04/24/19 0541)  alteplase (CATHFLO ACTIVASE) injection 2 mg (2 mg Intracatheter Given 04/24/19 0543)     Initial Impression / Assessment and Plan / ED Course  I have reviewed the triage vital signs and the nursing notes.  Pertinent labs & imaging results that were available during my care of the patient were reviewed by me and considered in my medical decision making (see chart for details).        Patient presents with near syncopal episode.  History of metastatic colon cancer and has previously required fluids for episodes of hypotension.  She is overall nontoxic-appearing on exam.  Clinically she appears dry.  She is nonfocal.  Initial blood pressure lightly below her baseline.  Patient was given fluids.  Basic lab work obtained.  She has no physical injuries on physical exam.  Doubt stroke.  Suspect this may be related to dehydration or hypotension.  No signs or symptoms of infection.  Work-up pending at time of signout.  Final Clinical Impressions(s) / ED Diagnoses   Final diagnoses:  Dehydration  Lightheadedness  At high risk for injury related to fall    ED Discharge Orders    None       Zi Sek, Barbette Hair, MD 04/24/19 6834    Merryl Hacker, MD 04/24/19 2304

## 2019-04-24 NOTE — ED Triage Notes (Signed)
Patient brought in by [facility]. Patient had near syncope episode in the bathroom. Patient is receiving chemo for colon cancer. Patient has not been eating and drinking well. Patient has diarrhea x 2 days.

## 2019-04-24 NOTE — ED Provider Notes (Signed)
Assumed care from Dr. Dina Rich at shift change.  Patient received 1 L normal saline, labs reveal some evidence of dehydration and mild AKI, urinalysis with mixed evidence to suggest UTI, however with patient having no fever, no dysuria, no suprapubic tenderness, will hold on antibiotics for now and sent for culture.  On my reassessment, patient's blood pressure while laying in bed is 88 systolic, with attempted sitting up in bed she became significantly lightheaded.  Requesting admission for dehydration, accepted by hospitalist service.  Clinical Impression   ICD-10-CM   1. Dehydration E86.0   2. Lightheadedness R42   3. At high risk for injury related to fall Z91.81       Maudie Flakes, MD 04/24/19 1011

## 2019-04-24 NOTE — ED Notes (Signed)
ED TO INPATIENT HANDOFF REPORT  Name/Age/Gender Nichole Cross 78 y.o. female  Code Status Code Status History    Date Active Date Inactive Code Status Order ID Comments User Context   03/02/2019 1436 03/10/2019 1707 Full Code [id number]  Leighton Ruff, MD Inpatient   01/09/2017 1650 01/14/2017 1459 Full Code [id number]  Leighton Ruff, MD Inpatient   08/09/2015 1443 08/13/2015 1943 Full Code [id number]  Jackolyn Confer, MD Inpatient   09/09/2012 1128 09/10/2012 1800 Full Code [id number]  Jackelyn Knife, RN Inpatient      Home/SNF/Other Home  Chief Complaint Hypotension, dehydration  Level of Care/Admitting Diagnosis ED Disposition    ED Disposition Condition [place] [facility]: [facility] [[id number]]  Level of Care: Telemetry [5]  Admit to tele based on following criteria: Eval of Syncope  Covid Evaluation: Screening Protocol (No Symptoms)  Diagnosis: Syncope [206001]  Admitting Physician: RAI, [place]atte K [[id number]]  Attending Physician: RAI, RIPUDEEP K [[id number]]  PT Class (Do Not Modify): Observation [104]  PT Acc Code (Do Not Modify): Observation [10022]       Medical History Past Medical History:  Diagnosis Date  . Anemia   . Arthritis    knees  . Blood dyscrasia 2016   microcytic anemia, likely iron deficiency anemia secondary to colon cancer  . Cataracts, bilateral   . Colon cancer ([place]) 06/2015   tubular villous adenoma on sigmoid colon specimen (08/09/15), myltiple tubular adenomas removed on a colonoscopy  11/16/15  . Complication of anesthesia   . Fatty liver   . Headache(784.0)    migraines  . Hypothyroidism   . Liver metastasis ([place])   . Metastasis to kidney ([place])   . Osteoporosis   . PONV (postoperative nausea and vomiting)    NAUSEA  . Renal carcinoma ([place]) 06/2016   ? primary  . Secondary liver cancer ([place]) 06/2016   ? metastasis from colon    Allergies Allergies  Allergen Reactions  . Lactose Intolerance (Gi)  Diarrhea    gas  . Aspirin Itching  . Morphine And Related Rash    "veins popped up and they gave me benadryl    IV Location/Drains/Wounds Patient Lines/Drains/Airways Status   Active Line/Drains/Airways    Name:   Placement date:   Placement time:   Site:   Days:   Implanted Port 03/02/19 Right Chest   03/02/19    1213    Chest   53   Peripheral IV 04/24/19 Right Forearm   04/24/19    0540    Forearm   less than 1   Colostomy LLQ   03/02/19    1119    LLQ   53   Urethral Catheter C. Dwyer RN Latex;Straight-tip;Double-lumen 14 Fr.   03/02/19    0850    Latex;Straight-tip;Double-lumen   53   Incision (Closed) 03/02/19 Abdomen Other (Comment)   03/02/19    1117     53   Incision (Closed) 03/02/19 Chest Right   03/02/19    1234     53   Incision - 3 Ports Abdomen Right;Mid;Upper Left;Mid;Upper Left;Mid;Lower   03/02/19    0915     46          Labs/Imaging Results for orders placed or performed during the hospital encounter of 04/24/19 (from the past 48 hour(s))  CBC with Differential     Status: Abnormal   Collection Time: 04/24/19  5:09 AM  Result Value Ref Range  WBC 8.5 4.0 - 10.5 K/uL   RBC 3.51 (L) 3.87 - 5.11 MIL/uL   Hemoglobin 11.3 (L) 12.0 - 15.0 g/dL   HCT 35.9 (L) 36.0 - 46.0 %   MCV 102.3 (H) 80.0 - 100.0 fL   MCH 32.2 26.0 - 34.0 pg   MCHC 31.5 30.0 - 36.0 g/dL   RDW 17.8 (H) 11.5 - 15.5 %   Platelets 206 150 - 400 K/uL   nRBC 0.0 0.0 - 0.2 %   Neutrophils Relative % 71 %   Neutro Abs 6.0 1.7 - 7.7 K/uL   Lymphocytes Relative 7 %   Lymphs Abs 0.6 (L) 0.7 - 4.0 K/uL   Monocytes Relative 8 %   Monocytes Absolute 0.7 0.1 - 1.0 K/uL   Eosinophils Relative 0 %   Eosinophils Absolute 0.0 0.0 - 0.5 K/uL   Basophils Relative 1 %   Basophils Absolute 0.1 0.0 - 0.1 K/uL   WBC Morphology MILD LEFT SHIFT (1-5% METAS, OCC MYELO, OCC BANDS)    Immature Granulocytes 13 %   Abs Immature Granulocytes 1.12 (H) 0.00 - 0.07 K/uL    Comment: Performed at [facility], [place] [address] [place], [place] [address]  Comprehensive metabolic panel     Status: Abnormal   Collection Time: 04/24/19  5:09 AM  Result Value Ref Range   Sodium 132 (L) 135 - 145 mmol/L   Potassium 3.7 3.5 - 5.1 mmol/L   Chloride 101 98 - 111 mmol/L   CO2 21 (L) 22 - 32 mmol/L   Glucose, Bld 104 (H) 70 - 99 mg/dL   BUN 36 (H) 8 - 23 mg/dL   Creatinine, Ser 1.07 (H) 0.44 - 1.00 mg/dL   Calcium 8.2 (L) 8.9 - 10.3 mg/dL   Total Protein 5.7 (L) 6.5 - 8.1 g/dL   Albumin 2.6 (L) 3.5 - 5.0 g/dL   AST 20 15 - 41 U/L   ALT 12 0 - 44 U/L   Alkaline Phosphatase 310 (H) 38 - 126 U/L   Total Bilirubin 0.5 0.3 - 1.2 mg/dL   GFR calc non Af Amer 51 (L) >60 mL/min   GFR calc Af Amer 59 (L) >60 mL/min   Anion gap 10 5 - 15    Comment: Performed at [facility], [place] [address] [place], [place] [address]  Urinalysis, Routine w reflex microscopic     Status: Abnormal   Collection Time: 04/24/19  7:32 AM  Result Value Ref Range   Color, Urine YELLOW YELLOW   APPearance TURBID (A) CLEAR   Specific Gravity, Urine 1.013 1.005 - 1.030   pH 9.0 (H) 5.0 - 8.0   Glucose, UA NEGATIVE NEGATIVE mg/dL   Hgb urine dipstick LARGE (A) NEGATIVE   Bilirubin Urine NEGATIVE NEGATIVE   Ketones, ur NEGATIVE NEGATIVE mg/dL   Protein, ur 30 (A) NEGATIVE mg/dL   Nitrite NEGATIVE NEGATIVE   Leukocytes,Ua MODERATE (A) NEGATIVE   RBC / HPF 6-10 0 - 5 RBC/hpf   WBC, UA 21-50 0 - 5 WBC/hpf   Bacteria, UA MANY (A) NONE SEEN   Mucus PRESENT     Comment: Performed at [facility], [place] [address] [place], [place] [address]   No results found.  Pending Labs Unresulted Labs (From admission, onward)    Start     Ordered   04/24/19 1030  Cortisol  ONCE - STAT,   R     04/24/19 1029   04/24/19 1030  Troponin I - Once  Once,   R     04/24/19 1029   04/24/19 1022  Urine culture  ONCE - STAT,   STAT     04/24/19 1021   04/24/19 0959  SARS Coronavirus 2 (CEPHEID -  Performed in [place] hospital lab), Hosp Order  (Asymptomatic Patients Labs)  ONCE - STAT,   R    Question:  Rule Out  Answer:  Yes   04/24/19 0958   Signed and Held  Urine culture  Once,   R     Signed and Held   Signed and Held  TSH  Once,   R     Signed and Held   Signed and Held  Hemoglobin A1c  Once,   R     Signed and Held   Signed and Occupational hygienist morning,   R     Signed and Held   Signed and Held  CBC  Tomorrow morning,   R     Signed and Held   Signed and Held  CBC  (enoxaparin (LOVENOX)    CrCl >/= 30 ml/min)  Once,   R    Comments:  Baseline for enoxaparin therapy IF NOT ALREADY DRAWN.  Notify MD if PLT < 100 K.    Signed and Held   Signed and Held  Creatinine, serum  (enoxaparin (LOVENOX)    CrCl >/= 30 ml/min)  Once,   R    Comments:  Baseline for enoxaparin therapy IF NOT ALREADY DRAWN.    Signed and Held   Signed and Held  Creatinine, serum  (enoxaparin (LOVENOX)    CrCl >/= 30 ml/min)  Weekly,   R    Comments:  while on enoxaparin therapy    Signed and Held   Signed and Held  Vitamin B12  Once,   R     Signed and Held   Signed and Held  Folate  Once,   R     Signed and Held          Vitals/Pain Today's Vitals   04/24/19 0600 04/24/19 0630 04/24/19 0700 04/24/19 0950  BP: 102/66 103/67 102/71 100/71  Pulse: 77 72 79 73  Resp: 14 18 11  (!) 9  Temp:      TempSrc:      SpO2: 98% 98% 100% 100%  Weight:      Height:      PainSc:        Isolation Precautions No active isolations  Medications Medications  sodium chloride flush (NS) 0.9 % injection 10-40 mL (has no administration in time range)  sodium chloride flush (NS) 0.9 % injection 10-40 mL (has no administration in time range)  sterile water (preservative free) injection 5 mL (has no administration in time range)  sodium chloride 0.9 % bolus 1,000 mL (has no administration in time range)  cefTRIAXone (ROCEPHIN) 1 g in sodium chloride 0.9 % 100 mL IVPB (has no  administration in time range)  sodium chloride 0.9 % bolus 1,000 mL (1,000 mLs Intravenous New Bag/Given 04/24/19 0541)  alteplase (CATHFLO ACTIVASE) injection 2 mg (2 mg Intracatheter Given 04/24/19 0543)

## 2019-04-25 ENCOUNTER — Observation Stay (HOSPITAL_COMMUNITY): Payer: Medicare Other

## 2019-04-25 DIAGNOSIS — Z923 Personal history of irradiation: Secondary | ICD-10-CM | POA: Diagnosis not present

## 2019-04-25 DIAGNOSIS — C787 Secondary malignant neoplasm of liver and intrahepatic bile duct: Secondary | ICD-10-CM

## 2019-04-25 DIAGNOSIS — D539 Nutritional anemia, unspecified: Secondary | ICD-10-CM | POA: Diagnosis present

## 2019-04-25 DIAGNOSIS — Z933 Colostomy status: Secondary | ICD-10-CM | POA: Diagnosis not present

## 2019-04-25 DIAGNOSIS — C187 Malignant neoplasm of sigmoid colon: Secondary | ICD-10-CM | POA: Diagnosis not present

## 2019-04-25 DIAGNOSIS — E860 Dehydration: Secondary | ICD-10-CM | POA: Diagnosis not present

## 2019-04-25 DIAGNOSIS — N2889 Other specified disorders of kidney and ureter: Secondary | ICD-10-CM

## 2019-04-25 DIAGNOSIS — Z95828 Presence of other vascular implants and grafts: Secondary | ICD-10-CM

## 2019-04-25 DIAGNOSIS — D589 Hereditary hemolytic anemia, unspecified: Secondary | ICD-10-CM | POA: Diagnosis not present

## 2019-04-25 DIAGNOSIS — Z681 Body mass index (BMI) 19 or less, adult: Secondary | ICD-10-CM | POA: Diagnosis not present

## 2019-04-25 DIAGNOSIS — I82453 Acute embolism and thrombosis of peroneal vein, bilateral: Secondary | ICD-10-CM | POA: Diagnosis not present

## 2019-04-25 DIAGNOSIS — I82442 Acute embolism and thrombosis of left tibial vein: Secondary | ICD-10-CM | POA: Diagnosis not present

## 2019-04-25 DIAGNOSIS — Z79899 Other long term (current) drug therapy: Secondary | ICD-10-CM | POA: Diagnosis not present

## 2019-04-25 DIAGNOSIS — D701 Agranulocytosis secondary to cancer chemotherapy: Secondary | ICD-10-CM | POA: Diagnosis present

## 2019-04-25 DIAGNOSIS — C641 Malignant neoplasm of right kidney, except renal pelvis: Secondary | ICD-10-CM | POA: Diagnosis present

## 2019-04-25 DIAGNOSIS — E038 Other specified hypothyroidism: Secondary | ICD-10-CM | POA: Diagnosis not present

## 2019-04-25 DIAGNOSIS — I82543 Chronic embolism and thrombosis of tibial vein, bilateral: Secondary | ICD-10-CM | POA: Diagnosis not present

## 2019-04-25 DIAGNOSIS — T451X5A Adverse effect of antineoplastic and immunosuppressive drugs, initial encounter: Secondary | ICD-10-CM | POA: Diagnosis present

## 2019-04-25 DIAGNOSIS — N179 Acute kidney failure, unspecified: Secondary | ICD-10-CM | POA: Diagnosis present

## 2019-04-25 DIAGNOSIS — C189 Malignant neoplasm of colon, unspecified: Secondary | ICD-10-CM | POA: Diagnosis not present

## 2019-04-25 DIAGNOSIS — Z9221 Personal history of antineoplastic chemotherapy: Secondary | ICD-10-CM

## 2019-04-25 DIAGNOSIS — G893 Neoplasm related pain (acute) (chronic): Secondary | ICD-10-CM

## 2019-04-25 DIAGNOSIS — E876 Hypokalemia: Secondary | ICD-10-CM | POA: Diagnosis present

## 2019-04-25 DIAGNOSIS — N390 Urinary tract infection, site not specified: Secondary | ICD-10-CM | POA: Diagnosis not present

## 2019-04-25 DIAGNOSIS — Z1159 Encounter for screening for other viral diseases: Secondary | ICD-10-CM | POA: Diagnosis not present

## 2019-04-25 DIAGNOSIS — Z79890 Hormone replacement therapy (postmenopausal): Secondary | ICD-10-CM | POA: Diagnosis not present

## 2019-04-25 DIAGNOSIS — Z79891 Long term (current) use of opiate analgesic: Secondary | ICD-10-CM | POA: Diagnosis not present

## 2019-04-25 DIAGNOSIS — E43 Unspecified severe protein-calorie malnutrition: Secondary | ICD-10-CM | POA: Diagnosis present

## 2019-04-25 DIAGNOSIS — H269 Unspecified cataract: Secondary | ICD-10-CM | POA: Diagnosis present

## 2019-04-25 DIAGNOSIS — E039 Hypothyroidism, unspecified: Secondary | ICD-10-CM | POA: Diagnosis present

## 2019-04-25 DIAGNOSIS — D630 Anemia in neoplastic disease: Secondary | ICD-10-CM | POA: Diagnosis not present

## 2019-04-25 DIAGNOSIS — Z96659 Presence of unspecified artificial knee joint: Secondary | ICD-10-CM | POA: Diagnosis present

## 2019-04-25 DIAGNOSIS — M810 Age-related osteoporosis without current pathological fracture: Secondary | ICD-10-CM | POA: Diagnosis present

## 2019-04-25 DIAGNOSIS — R609 Edema, unspecified: Secondary | ICD-10-CM | POA: Diagnosis not present

## 2019-04-25 DIAGNOSIS — R55 Syncope and collapse: Secondary | ICD-10-CM | POA: Diagnosis not present

## 2019-04-25 DIAGNOSIS — Z9049 Acquired absence of other specified parts of digestive tract: Secondary | ICD-10-CM | POA: Diagnosis not present

## 2019-04-25 DIAGNOSIS — D6481 Anemia due to antineoplastic chemotherapy: Secondary | ICD-10-CM | POA: Diagnosis not present

## 2019-04-25 LAB — BASIC METABOLIC PANEL
Anion gap: 7 (ref 5–15)
BUN: 30 mg/dL — ABNORMAL HIGH (ref 8–23)
CO2: 21 mmol/L — ABNORMAL LOW (ref 22–32)
Calcium: 7.4 mg/dL — ABNORMAL LOW (ref 8.9–10.3)
Chloride: 108 mmol/L (ref 98–111)
Creatinine, Ser: 0.73 mg/dL (ref 0.44–1.00)
GFR calc Af Amer: >60 mL/min (ref >60)
GFR calc non Af Amer: >60 mL/min (ref >60)
Glucose, Bld: 98 mg/dL (ref 70–99)
Potassium: 3.5 mmol/L (ref 3.5–5.1)
Sodium: 136 mmol/L (ref 135–145)

## 2019-04-25 LAB — CBC
HCT: 28.1 % — ABNORMAL LOW (ref 36.0–46.0)
Hemoglobin: 8.7 g/dL — ABNORMAL LOW (ref 12.0–15.0)
MCH: 32.2 pg (ref 26.0–34.0)
MCHC: 31 g/dL (ref 30.0–36.0)
MCV: 104.1 fL — ABNORMAL HIGH (ref 80.0–100.0)
Platelets: 157 10*3/uL (ref 150–400)
RBC: 2.7 MIL/uL — ABNORMAL LOW (ref 3.87–5.11)
RDW: 17.7 % — ABNORMAL HIGH (ref 11.5–15.5)
WBC: 6.6 10*3/uL (ref 4.0–10.5)
nRBC: 0 % (ref 0.0–0.2)

## 2019-04-25 LAB — T4, FREE: Free T4: 1.16 ng/dL (ref 0.82–1.77)

## 2019-04-25 MED ORDER — ENOXAPARIN SODIUM 60 MG/0.6ML ~~LOC~~ SOLN
55.0000 mg | Freq: Two times a day (BID) | SUBCUTANEOUS | Status: DC
  Administered 2019-04-25 – 2019-04-26 (×3): 55 mg via SUBCUTANEOUS
  Filled 2019-04-25 (×3): qty 0.6

## 2019-04-25 MED ORDER — ENOXAPARIN SODIUM 60 MG/0.6ML ~~LOC~~ SOLN
55.0000 mg | SUBCUTANEOUS | Status: AC
  Administered 2019-04-25: 55 mg via SUBCUTANEOUS
  Filled 2019-04-25: qty 0.6

## 2019-04-25 MED ORDER — LOPERAMIDE HCL 2 MG PO CAPS
2.0000 mg | ORAL_CAPSULE | Freq: Four times a day (QID) | ORAL | Status: DC
  Administered 2019-04-25 – 2019-04-27 (×4): 2 mg via ORAL
  Filled 2019-04-25 (×5): qty 1

## 2019-04-25 NOTE — Progress Notes (Signed)
Initial Nutrition Assessment  INTERVENTION:   Continue Ensure Enlive po BID, each supplement provides 350 kcal and 20 grams of protein  NUTRITION DIAGNOSIS:   Increased nutrient needs related to cancer and cancer related treatments as evidenced by estimated needs.  GOAL:   Patient will meet greater than or equal to 90% of their needs  MONITOR:   PO intake, Supplement acceptance, Labs, Weight trends, I & O's  REASON FOR ASSESSMENT:   Malnutrition Screening Tool    ASSESSMENT:    78 year old female with a history of anal carcinoma of distal sigmoid colon, undergoing chemotherapy post transverse colostomy and Port-A-Cath placement in 02/2019, started chemotherapy FOLFOX cycle 1 on 04/04/2019, macrocytic anemia presented to ED with dizziness and near syncopal episode this morning in the bathroom.  Patient reports that she has not been eating and drinking well in the last 2 to 3 days due to loss of appetite.    **RD working remotely**  Patient with metastatic colon cancer, diagnosed in 2016. Pt currently undergoing chemotherapy, last dose 5/18. Pt has had poor appetite since starting chemotherapy. Pt has also had high output from colostomy.   Patient currently drinking Ensure supplements and eating 60% of meals so far.  Per weight records, pt has lost 23 lbs since 09/01/18 (15% wt loss x 8 months, significant for time frame).   Labs reviewed. Medications: Ferrous sulfate tablet daily, Vitamin B-12 tablet daily  NUTRITION - FOCUSED PHYSICAL EXAM:  Unable to perform per department requirements to work remotely.   Diet Order:   Diet Order            Diet regular Room service appropriate? Yes; Fluid consistency: Thin  Diet effective now              EDUCATION NEEDS:   No education needs have been identified at this time  Skin:  Skin Assessment: Reviewed RN Assessment  Last BM:  6/8 -colostomy  Height:   Ht Readings from Last 1 Encounters:  04/24/19 5\' 7"  (1.702 m)     Weight:   Wt Readings from Last 1 Encounters:  04/24/19 56.1 kg    Ideal Body Weight:  61.3 kg  BMI:  Body mass index is 19.37 kg/m.  Estimated Nutritional Needs:   Kcal:  1700-1900  Protein:  85-95g  Fluid:  1.9L/day   Clayton Bibles, MS, RD, LDN [place] [facility] Pager: [phone] After Hours Pager: [phone]

## 2019-04-25 NOTE — Progress Notes (Signed)
[facility] PROGRESS NOTE  Nichole Cross BDZ:[id number] DOB: 1941/02/22 DOA: 04/24/2019 PCP: [name], MD   Narrative: 78 y/o w ? Stage II ?? colon cancer s/p lap colostomy + port 03/02/2019-initiated FOLFOX 3/41/9622-[id number] complications volume depletion from ostomy in the office managed 6/1, 6/2 2020 Right renal mass with stability on CT Chemotherapy related neutropenia Tubular adenoma status post colonoscopy/removal Bimodal anemia microcytic plus macrocytic-B12 given, transfused in the past month, started on prednisone per oncologist  Admit [facility] H67 2020 volume depletion, significant orthostasis blood pressures 80s, anorexia X1 week Labs as per admitting physician?  UTI  Work-up revealed bilateral DVTs  A & Plan Volume depletion in setting of ostomy hyper output Metastatic colon cancer--s/p resection colocolonic recurrence at anastomosis in proximal rectum 02/2019/FOLFOX Severe malnutrition and cancer related cachexia BMI of 19-states has lost about 15 pounds since surgery 02/2019 Increase saline 125 cc/H as is still very orthostatic Improving BUN/creatinine trends Stress dosing steroids reasonable however overall marked suppression expected of HPA axis secondary to cancer, depletion so have discontinued the same and kept on oral steroids Scheduled Imodium 2 mg 4 times daily per her oncologist this morning-watch for severe constipation and may eliminate a dose if she does not have diarrhea and go back to as needed scheduling over the next 24 hours Bilateral DVT Likely etiology = immobility, cancer underlying Oncologist to comment on duration-probably lifelong Lovenox? Platelets okay monitor in a.m Defer to oncologist whether to scan chest-.  She is bradycardic and I do not think she has a PE Macrocytic anemia Previously on prednisone for this indication Obtain differential in a.m. as B12 is normal Obtain methylmalonic acid Right renal mass under  surveillance Tubular adenoma history   DVT full dose heparin-decision to be made by oncologist regarding need for Lovenox versus NOAC code Status: Full code at this time communication: Patient wishes to update her daughter on her own disposition Plan: Likely inpatient if continues to need fluids   Verlon Au, MD  [facility] [address] [place] [email or web address] 7PM-7AM contact night coverage as above 04/25/2019, 11:44 AM  LOS: 0 days   Consultants:  Oncologist  Procedures:  None  Antimicrobials:  None  Interval history/Subjective: Awake alert coherent feels better than this morning-was very dizzy and was not able to participate with orthostatics Tolerating some diet-nursing reports stool has become more solid No chest pain No fever No chills   Objective:  Vitals:  Vitals:   04/24/19 2323 04/25/19 0438  BP: 112/68   Pulse: (!) 56 (!) 54  Resp: 16 18  Temp: 97.8 F (36.6 C) 97.6 F (36.4 C)  SpO2: 99% 98%    Exam:   ruddy complexion, icteric, no pallor, by temporalis in addition to supraclavicular wasting Chest clear Abdomen soft ostomy in place no stool Lower extremity edema + cannot appreciate cord or tenderness Neurologically intact ROM intact to major joints   I have personally reviewed the following:  DATA   Labs:  BUN/creatinine now down to 30/0.7, calcium 7.4  B12 1423  Hemoglobin 8.7 down from 9.6  Platelets 157  A1c 3.8 cortisol 18.4 TSH 11.4  Imaging studies:  Venous duplex today is positive for left femoral DVT and right sided DVT in addition   Scheduled Meds: . alteplase  2 mg Intracatheter Once  . enoxaparin (LOVENOX) injection  55 mg Subcutaneous Q12H  . feeding supplement (ENSURE ENLIVE)  237 mL Oral BID BM  . ferrous sulfate  325 mg Oral Q breakfast  . levothyroxine  150 mcg Oral Daily  . loperamide  2 mg Oral QID  . predniSONE  20 mg Oral Q breakfast  . sodium chloride flush  10-40 mL Intracatheter Q12H  . sterile water  (preservative free)  5 mL Injection Once  . vitamin B-12  1,000 mcg Oral Daily   Continuous Infusions: . sodium chloride 125 mL/hr at 04/25/19 0828  . cefTRIAXone (ROCEPHIN)  IV 1 g (04/25/19 1120)    Principal Problem:   Syncope Active Problems:   Hypothyroidism   Adenocarcinoma of colon metastatic to liver ([place])   Dehydration   Acute lower UTI   LOS: 0 days

## 2019-04-25 NOTE — Progress Notes (Signed)
[place] for lovenox Indication: acute DVT  Allergies  Allergen Reactions  . Lactose Intolerance (Gi) Diarrhea    gas  . Aspirin Itching  . Morphine And Related Rash    "veins popped up and they gave me benadryl    Patient Measurements: Height: 5\' 7"  (170.2 cm) Weight: 123 lb 10.9 oz (56.1 kg) IBW/kg (Calculated) : 61.6 Heparin Dosing Weight:   Vital Signs: Temp: 97.6 F (36.4 C) (06/08 0438) Temp Source: Oral (06/08 0438) BP: 112/68 (06/07 2323) Pulse Rate: 54 (06/08 0438)  Labs: Recent Labs    04/24/19 0509 04/24/19 1258 04/25/19 0405  HGB 11.3* 9.6* 8.7*  HCT 35.9* 31.4* 28.1*  PLT 206 173 157  CREATININE 1.07* 0.74 0.73  TROPONINI  --  <0.03  --     Estimated Creatinine Clearance: 53.8 mL/min (by C-G formula based on SCr of 0.73 mg/dL).   Assessment: Patient is a 78 y.o F with metastatic colon cancer on chemotherapy PTA, presented to the ED on 6/7 with c/o syncope and dizziness.  LLE doppler on 6/8 came back positive for bilateral LE DVT.  To start lovenox for acute DVT.  Today, 04/25/2019: - hgb and plts trending down- 8.7 and 157, respectively - no bleeding documented   Goal of Therapy:  Anti-Xa level 0.6-1 units/ml 4hrs after LMWH dose given Monitor platelets by anticoagulation protocol: Yes   Plan:  - d/c lovenox 40 mg daily - start lovenox 55 mg SQ q12h - daily cbc for now - monitor for s/s bleeding  Rynn Markiewicz P 04/25/2019,11:12 AM

## 2019-04-25 NOTE — Progress Notes (Signed)
VASCULAR LAB PRELIMINARY  PRELIMINARY  PRELIMINARY  PRELIMINARY  Bilateral lower extremity venous duplex completed.    Preliminary report:  Se CV proc for preliminary results.  Claudette Laws, RN critical results. She is sending note to physician.  Ngina Royer, RVT 04/25/2019, 9:52 AM

## 2019-04-25 NOTE — Progress Notes (Signed)
PT Cancellation Note  Patient Details Name: Nichole Cross MRN: [id number] DOB: 11-Mar-1941   Cancelled Treatment:    Reason Eval/Treat Not Completed: Medical issues which prohibited therapy(acute DVTs)  Will check back as schedule permits.   Serigne Kubicek,KATHrine E 04/25/2019, 11:45 AM Carmelia Bake, PT, DPT Acute Rehabilitation Services Office: [phone] Pager: [phone]

## 2019-04-25 NOTE — Progress Notes (Signed)
IP PROGRESS NOTE  Subjective:   Nichole Cross is well-known to me with a history of metastatic colon cancer, currently being treated with FOLFOX chemotherapy.  She has been seen at the cancer center multiple times over the past few weeks with dehydration.  The dehydration is felt to be related to high output from the transverse colostomy. She was admitted yesterday with presyncope.  She reports feeling better.  She is not sure how much output there has been from the colostomy.  Rectal pain has improved.  Objective: Vital signs in last 24 hours: Blood pressure 112/68, pulse (!) 54, temperature 97.6 F (36.4 C), temperature source Oral, resp. rate 18, height '5\' 7"'  (1.702 m), weight 123 lb 10.9 oz (56.1 kg), SpO2 98 %.  Intake/Output from previous day: 06/07 0701 - 06/08 0700 In: 1930 [I.V.:1830; IV Piggyback:100] Out: -   Physical Exam:  HEENT: White coat over the tongue, no buccal thrush or ulcers Lungs: Clear anteriorly Cardiac: Regular rate and rhythm Abdomen: Nontender, no hepatomegaly, formed soft stool in the colostomy bag Extremities: 1+ edema at the left lower leg   Portacath/PICC-without erythema  Lab Results: Recent Labs    04/24/19 1258 04/25/19 0405  WBC 6.7 6.6  HGB 9.6* 8.7*  HCT 31.4* 28.1*  PLT 173 157    BMET Recent Labs    04/24/19 0509 04/24/19 1258 04/25/19 0405  NA 132*  --  136  K 3.7  --  3.5  CL 101  --  108  CO2 21*  --  21*  GLUCOSE 104*  --  98  BUN 36*  --  30*  CREATININE 1.07* 0.74 0.73  CALCIUM 8.2*  --  7.4*    Lab Results  Component Value Date   CEA1 178.58 (H) 03/28/2019     Medications: I have reviewed the patient's current medications.  Assessment/Plan: 1.Adenocarcinoma the distal sigmoid colon, poorly differentiated, stage II (T3 N0), status post a laparoscopic assisted sigmoid colectomy 08/09/2015  No loss of mismatch repair protein expression  Elevated preoperative CEA  Staging CT of the abdomen and pelvis  06/22/2015 with no evidence of distant metastatic disease  Mildly elevated CEA 02/23/2017And 04/17/2016  CTs chest, abdomen, and pelvis on 06/04/2016, 41m left upper lobe nodule-no comparison available, new hypodense lesion in the left liver, solid Right renal mass  MRI 06/10/2016-2 enhancing lesions in the liver consistent with metastatic disease, segment 2 and segment 5. Solid enhancing right renal lesion consistent with a renal cell carcinoma  Radiofrequency ablation of 2 liver lesions on 07/25/2016  Cycle 1 adjuvant Xeloda 08/18/2016  Cycle 2 Xeloda 09/08/2016  CEA improved 09/26/2016  Cycle 3 Xeloda held 09/29/2016 due to unexplained abdominal pain,referred for CT  CT abdomen/pelvis 09/30/2016 with a long segment of bowel wall thickening involving the distal ileum; lesion left hepatic lobe similar to slightly smaller following ablation; lesion posterior right hepatic lobe elongated favored to represent treatment tract.  Cycle 3 Xeloda 10/10/2016 (dose reduced due to drug induced enteritis following cycle 2)discontinued 10/15/2016 secondary to abdominal pain.  CT in while the emergency room with abdominal pain 10/15/2016 revealed descending colitis  Colonoscopy 10/30/2016 confirmed a mass at the: colo-colonicanastomosis  Cycle 4 Xeloda 11/03/2016  01/09/2017 status post a low anterior resection for removal of locally recurrent tumor at the sigmoid anastomosis, resection margins negative, tumor invades pericolonic soft tissue, 12 benign lymph nodes;MSI stable. Foundation 1 testing- KRAS G13D, MSS, tumor mutation burden-3,BRAFwild-type  Cycle 5 Xeloda 02/12/2017  Cycle 6 Xeloda 03/12/2017, Xeloda dose  reduced to 1000 mg twice daily on 03/18/2017  Cycle 7 Xeloda 04/09/2017  Cycle 8 Xeloda 05/12/2017  Restaging CTs 06/24/2017-no evidence of metastatic disease involving the chest. Post ablation changes in the liver. No new hepatic metastatic disease.  Restaging CTs  12/14/2017- stable liver ablation sites, indeterminate nodule adjacent to the descending colon, stable left renal mass, changes of early cirrhosis, enlarged porta hepatis node-likely reactive  MRI abdomen 04/22/2018- enlargement of 1.7 cm left paracolic gutter mass, stable 1 cm extrahepatic left liver capsule lesion, no evidence of local tumor recurrence at the ablation sites, no new liver lesions, stable right kidney mass  Colonoscopy 05/19/2018- mass in the rectum at the anastomotic site beginning at 3 cm from the anal verge, biopsy confirmed invasive adenocarcinoma  CT 06/07/2018-stable liver ablation sites, enlarging peritoneal lesion at the left pericolic gutter, stable nodule in the sigmoid mesocolon, stable prominent periportal lymph nodes, no evidence of metastatic disease to the chest  Radiation/Xeloda beginning 06/21/2018  Xeloda dose reduced to 1000 mg twice daily days of radiationbeginning8/16/2019 due to neutropenia  Radiation/Xeloda completed 08/02/2018  CTs 10/07/2018- new 6 mm right upper lobe nodule, stable ablation changes in the left and right liver, stable superior pole right renal mass, increased soft tissue thickening the presacral region, stable 12 mm porta hepatis node, increase in size of soft tissue nodule adjacent to the descending colon-2.1 x 2.2 cm  CTs 01/11/2019- anterior right upper lobe subpleural nodule-smaller, no suspicious lung nodules, stable ablation defects in the liver, stable right kidney mass, wall thickening at the suture line in the lower pelvis with extension to the presacral region/vaginal fornix, slight enlargement of left pericolic gutter peritoneal implant  Sigmoidoscopy 01/27/2019- mass at 5 cm from the anal verge, biopsy confirmed adenocarcinoma  Laparoscopic transverse colostomy and Port-A-Cath placement 03/02/2019- tumor noted to be studding the descending colon  Cycle 1 FOLFOX 04/04/2019    2.History ofMicrocytic anemia-likely iron  deficiency anemia secondary to colon cancer, persistent microcytic anemia;ferrous sulfate increased to twice daily 12/15/2017;hemoglobin in normal range 03/16/2018  3.Tubular villous adenoma on the sigmoid colon resection specimen 08/09/2015  Multiple tubular adenomas removed on a colonoscopy 11/16/2015  4. Right renal mass on CT 06/04/2016-suspicious for renal cell carcinoma;stable on CT 06/24/2017;referred to urology  Stable on CT 01/11/2019  5. History ofneutropenia secondary to chemotherapy  6.Pain secondary to local recurrence of colon cancer-improved  7.Abdominal pain. CT abdomen/pelvis 07/24/2018- inflammatory changes and mild dilatation of multiple small bowel loops in the lower abdomen and pelvis. Unchanged metastatic implant in the left paracolic gutter; unchanged periportal lymphadenopathy; hepatic steatosis; stable liver ablation site; unchanged 2.7 cm enhancing right renal mass.Resolved.  8.Severe macrocytic anemia 03/28/2019- vitamin B12 level at the low end of normal, hypersegmented neutrophils noted on peripheral blood smear, DAT positive, elevated LDH and bilirubin  Prednisone, 60 mg daily started 03/29/2019  1 unit of packed red blood cells 03/28/2019  Hemoglobin improved 03/30/2019, stable 03/31/2019  B12 initiated 03/30/2019  Hemoglobin improved   9.  Admission 04/24/2019 with presyncope 10.  Urinary tract infection 04/24/2019  Nichole Cross has metastatic colon cancer.  She has completed 1 cycle of FOLFOX chemotherapy.  Pelvic pain has improved.  She has been seen at the Cancer center on several occasions over the past few weeks with dehydration.  She is now admitted with presyncope secondary to dehydration.  The dehydration is likely secondary to high output from the transverse colostomy and limited oral intake.  Recommendations: 1.  Continue intravenous hydration 2.  Monitor  input/output including stool output 3.  Begin scheduled Imodium 4.   Lower extremity Dopplers 5.  Continue prednisone, will taper as an outpatient  I will continue following Nichole Cross in the hospital.  Outpatient follow-up will be scheduled at the Cancer center.   LOS: 0 days   [name], MD   04/25/2019, 8:03 AM

## 2019-04-26 ENCOUNTER — Ambulatory Visit: Payer: Medicare Other | Admitting: Nurse Practitioner

## 2019-04-26 ENCOUNTER — Other Ambulatory Visit: Payer: Medicare Other

## 2019-04-26 ENCOUNTER — Ambulatory Visit: Payer: Medicare Other

## 2019-04-26 ENCOUNTER — Telehealth: Payer: Self-pay | Admitting: Nurse Practitioner

## 2019-04-26 LAB — COMPREHENSIVE METABOLIC PANEL
ALT: 10 U/L (ref 0–44)
AST: 17 U/L (ref 15–41)
Albumin: 1.9 g/dL — ABNORMAL LOW (ref 3.5–5.0)
Alkaline Phosphatase: 283 U/L — ABNORMAL HIGH (ref 38–126)
Anion gap: 6 (ref 5–15)
BUN: 28 mg/dL — ABNORMAL HIGH (ref 8–23)
CO2: 20 mmol/L — ABNORMAL LOW (ref 22–32)
Calcium: 7.4 mg/dL — ABNORMAL LOW (ref 8.9–10.3)
Chloride: 109 mmol/L (ref 98–111)
Creatinine, Ser: 0.79 mg/dL (ref 0.44–1.00)
GFR calc Af Amer: >60 mL/min (ref >60)
GFR calc non Af Amer: >60 mL/min (ref >60)
Glucose, Bld: 107 mg/dL — ABNORMAL HIGH (ref 70–99)
Potassium: 3.3 mmol/L — ABNORMAL LOW (ref 3.5–5.1)
Sodium: 135 mmol/L (ref 135–145)
Total Bilirubin: 0.2 mg/dL — ABNORMAL LOW (ref 0.3–1.2)
Total Protein: 4.2 g/dL — ABNORMAL LOW (ref 6.5–8.1)

## 2019-04-26 LAB — CBC
HCT: 27.4 % — ABNORMAL LOW (ref 36.0–46.0)
Hemoglobin: 8.5 g/dL — ABNORMAL LOW (ref 12.0–15.0)
MCH: 32.6 pg (ref 26.0–34.0)
MCHC: 31 g/dL (ref 30.0–36.0)
MCV: 105 fL — ABNORMAL HIGH (ref 80.0–100.0)
Platelets: 153 10*3/uL (ref 150–400)
RBC: 2.61 MIL/uL — ABNORMAL LOW (ref 3.87–5.11)
RDW: 17.8 % — ABNORMAL HIGH (ref 11.5–15.5)
WBC: 6.4 10*3/uL (ref 4.0–10.5)
nRBC: 0 % (ref 0.0–0.2)

## 2019-04-26 LAB — URINE CULTURE

## 2019-04-26 MED ORDER — POTASSIUM CHLORIDE CRYS ER 20 MEQ PO TBCR
40.0000 meq | EXTENDED_RELEASE_TABLET | Freq: Every day | ORAL | Status: DC
  Administered 2019-04-26 – 2019-04-30 (×5): 40 meq via ORAL
  Filled 2019-04-26 (×5): qty 2

## 2019-04-26 NOTE — Evaluation (Signed)
Physical Therapy Evaluation Patient Details Name: Nichole Cross MRN: [id number] DOB: 1940-12-20 Today's Date: 04/26/2019   History of Present Illness  78 year old female with a history of anal carcinoma of distal sigmoid colon, undergoing chemotherapy post transverse colostomy and Port-A-Cath placement in 02/2019, started chemotherapy FOLFOX cycle 1 on 04/04/2019, macrocytic anemia presented to ED with dizziness and near syncopal episode this morning in the bathroom.  Pt admitted for dehydration and found to have acute bilateral DVTs  Clinical Impression  Pt admitted with above diagnosis. Pt currently with functional limitations due to the deficits listed below (see PT Problem List).  Pt will benefit from skilled PT to increase their independence and safety with mobility to allow discharge to the venue listed below.  Pt a little unsteady with ambulation however only wished to push IV pole today.  Pt encouraged to use SPC or RW upon d/c home for safety and she was agreeable.  Pt states her daughter can assist if/when needed.  Pt also agreeable to HHPT.     Follow Up Recommendations Home health PT;Supervision for mobility/OOB    Equipment Recommendations  None recommended by PT    Recommendations for Other Services       Precautions / Restrictions Precautions Precautions: Fall      Mobility  Bed Mobility Overal bed mobility: Needs Assistance Bed Mobility: Supine to Sit;Sit to Supine     Supine to sit: Supervision;HOB elevated Sit to supine: HOB elevated;Mod assist   General bed mobility comments: pt requested assist for LEs onto bed  Transfers Overall transfer level: Needs assistance Equipment used: 1 person hand held assist Transfers: Sit to/from Stand Sit to Stand: Min guard         General transfer comment: verbal cues for safet technique  Ambulation/Gait Ambulation/Gait assistance: Min guard Gait Distance (Feet): 160 Feet Assistive device: IV Pole Gait  Pattern/deviations: Decreased stance time - left;Antalgic;Step-through pattern     General Gait Details: antalgic like gait pattern however pt reports no pain, required UE support and utilized IV pole  Stairs            Wheelchair Mobility    Modified Rankin (Stroke Patients Only)       Balance Overall balance assessment: Mild deficits observed, not formally tested;Needs assistance         Standing balance support: No upper extremity supported Standing balance-Leahy Scale: Fair Standing balance comment: static standing fair                             Pertinent Vitals/Pain Pain Assessment: No/denies pain    Home Living Family/patient expects to be discharged to:: Private residence Living Arrangements: Children Available Help at Discharge: Family;Available 24 hours/day Type of Home: House Home Access: Stairs to enter Entrance Stairs-Rails: Right Entrance Stairs-Number of Steps: 4 Home Layout: One level Home Equipment: Walker - 2 wheels;Cane - single point Additional Comments: daughter can assist as needed    Prior Function Level of Independence: Independent               Hand Dominance        Extremity/Trunk Assessment        Lower Extremity Assessment Lower Extremity Assessment: Generalized weakness    Cervical / Trunk Assessment Cervical / Trunk Assessment: Normal  Communication   Communication: No difficulties  Cognition Arousal/Alertness: Awake/alert Behavior During Therapy: Flat affect Overall Cognitive Status: Within Functional Limits for tasks assessed  General Comments      Exercises     Assessment/Plan    PT Assessment Patient needs continued PT services  PT Problem List Decreased strength;Decreased balance;Decreased activity tolerance;Decreased mobility;Decreased knowledge of use of DME       PT Treatment Interventions DME instruction;Functional mobility  training;Patient/family education;Balance training;Gait training;Therapeutic activities;Stair training;Therapeutic exercise    PT Goals (Current goals can be found in the Care Plan section)  Acute Rehab PT Goals PT Goal Formulation: With patient Time For Goal Achievement: 05/03/19 Potential to Achieve Goals: Good    Frequency Min 3X/week   Barriers to discharge        Co-evaluation               AM-PAC PT "6 Clicks" Mobility  Outcome Measure Help needed turning from your back to your side while in a flat bed without using bedrails?: None Help needed moving from lying on your back to sitting on the side of a flat bed without using bedrails?: A Little Help needed moving to and from a bed to a chair (including a wheelchair)?: A Little Help needed standing up from a chair using your arms (e.g., wheelchair or bedside chair)?: A Little Help needed to walk in hospital room?: A Little Help needed climbing 3-5 steps with a railing? : A Little 6 Click Score: 19    End of Session   Activity Tolerance: Patient limited by fatigue Patient left: in bed;with call bell/phone within reach;with bed alarm set        Time: 1100-1115 PT Time Calculation (min) (ACUTE ONLY): 15 min   Charges:   PT Evaluation $PT Eval Low Complexity: [place], PT, DPT Acute Rehabilitation Services Office: [phone] Pager: [phone]  Ameri Cahoon,KATHrine E 04/26/2019, 11:59 AM

## 2019-04-26 NOTE — Progress Notes (Addendum)
IP PROGRESS NOTE  Subjective:   [name] is feeling better this morning.  Her pain is controlled.  Reports that she has had no output through her ostomy since yesterday.  Denies nausea and vomiting.  Still has lower extremity edema, left greater than right.  Has been started on Lovenox for DVT.  She has no other complaints this morning.  Objective: Vital signs in last 24 hours: Blood pressure 127/85, pulse 80, temperature 97.8 F (36.6 C), temperature source Oral, resp. rate 16, height '5\' 7"'  (1.702 m), weight 123 lb 10.9 oz (56.1 kg), SpO2 100 %.  Intake/Output from previous day: 06/08 0701 - 06/09 0700 In: 3694.9 [P.O.:720; I.V.:2774.9; IV Piggyback:200] Out: 7 [Urine:1; Stool:6]  Physical Exam:  HEENT: White coat over the tongue, no buccal thrush or ulcers Lungs: Clear anteriorly Cardiac: Regular rate and rhythm Abdomen: Nontender, no hepatomegaly, no stool or drainage in the colostomy bag Extremities: 1+ edema at the left lower leg; trace edema to the right lower extremity  Portacath/PICC-without erythema  Lab Results: Recent Labs    04/25/19 0405 04/26/19 0345  WBC 6.6 6.4  HGB 8.7* 8.5*  HCT 28.1* 27.4*  PLT 157 153    BMET Recent Labs    04/25/19 0405 04/26/19 0345  NA 136 135  K 3.5 3.3*  CL 108 109  CO2 21* 20*  GLUCOSE 98 107*  BUN 30* 28*  CREATININE 0.73 0.79  CALCIUM 7.4* 7.4*    Lab Results  Component Value Date   CEA1 178.58 (H) 03/28/2019     Medications: I have reviewed the patient's current medications.  Assessment/Plan: 1.Adenocarcinoma the distal sigmoid colon, poorly differentiated, stage II (T3 N0), status post a laparoscopic assisted sigmoid colectomy 08/09/2015  No loss of mismatch repair protein expression  Elevated preoperative CEA  Staging CT of the abdomen and pelvis 06/22/2015 with no evidence of distant metastatic disease  Mildly elevated CEA 02/23/2017And 04/17/2016  CTs chest, abdomen, and pelvis on 06/04/2016,  79m left upper lobe nodule-no comparison available, new hypodense lesion in the left liver, solid Right renal mass  MRI 06/10/2016-2 enhancing lesions in the liver consistent with metastatic disease, segment 2 and segment 5. Solid enhancing right renal lesion consistent with a renal cell carcinoma  Radiofrequency ablation of 2 liver lesions on 07/25/2016  Cycle 1 adjuvant Xeloda 08/18/2016  Cycle 2 Xeloda 09/08/2016  CEA improved 09/26/2016  Cycle 3 Xeloda held 09/29/2016 due to unexplained abdominal pain,referred for CT  CT abdomen/pelvis 09/30/2016 with a long segment of bowel wall thickening involving the distal ileum; lesion left hepatic lobe similar to slightly smaller following ablation; lesion posterior right hepatic lobe elongated favored to represent treatment tract.  Cycle 3 Xeloda 10/10/2016 (dose reduced due to drug induced enteritis following cycle 2)discontinued 10/15/2016 secondary to abdominal pain.  CT in while the emergency room with abdominal pain 10/15/2016 revealed descending colitis  Colonoscopy 10/30/2016 confirmed a mass at the: colo-colonicanastomosis  Cycle 4 Xeloda 11/03/2016  01/09/2017 status post a low anterior resection for removal of locally recurrent tumor at the sigmoid anastomosis, resection margins negative, tumor invades pericolonic soft tissue, 12 benign lymph nodes;MSI stable. Foundation 1 testing- KRAS G13D, MSS, tumor mutation burden-3,BRAFwild-type  Cycle 5 Xeloda 02/12/2017  Cycle 6 Xeloda 03/12/2017, Xeloda dose reduced to 1000 mg twice daily on 03/18/2017  Cycle 7 Xeloda 04/09/2017  Cycle 8 Xeloda 05/12/2017  Restaging CTs 06/24/2017-no evidence of metastatic disease involving the chest. Post ablation changes in the liver. No new hepatic metastatic disease.  Restaging  CTs 12/14/2017- stable liver ablation sites, indeterminate nodule adjacent to the descending colon, stable left renal mass, changes of early cirrhosis, enlarged  porta hepatis node-likely reactive  MRI abdomen 04/22/2018- enlargement of 1.7 cm left paracolic gutter mass, stable 1 cm extrahepatic left liver capsule lesion, no evidence of local tumor recurrence at the ablation sites, no new liver lesions, stable right kidney mass  Colonoscopy 05/19/2018- mass in the rectum at the anastomotic site beginning at 3 cm from the anal verge, biopsy confirmed invasive adenocarcinoma  CT 06/07/2018-stable liver ablation sites, enlarging peritoneal lesion at the left pericolic gutter, stable nodule in the sigmoid mesocolon, stable prominent periportal lymph nodes, no evidence of metastatic disease to the chest  Radiation/Xeloda beginning 06/21/2018  Xeloda dose reduced to 1000 mg twice daily days of radiationbeginning8/16/2019 due to neutropenia  Radiation/Xeloda completed 08/02/2018  CTs 10/07/2018- new 6 mm right upper lobe nodule, stable ablation changes in the left and right liver, stable superior pole right renal mass, increased soft tissue thickening the presacral region, stable 12 mm porta hepatis node, increase in size of soft tissue nodule adjacent to the descending colon-2.1 x 2.2 cm  CTs 01/11/2019- anterior right upper lobe subpleural nodule-smaller, no suspicious lung nodules, stable ablation defects in the liver, stable right kidney mass, wall thickening at the suture line in the lower pelvis with extension to the presacral region/vaginal fornix, slight enlargement of left pericolic gutter peritoneal implant  Sigmoidoscopy 01/27/2019- mass at 5 cm from the anal verge, biopsy confirmed adenocarcinoma  Laparoscopic transverse colostomy and Port-A-Cath placement 03/02/2019- tumor noted to be studding the descending colon  Cycle 1 FOLFOX 04/04/2019    2.History ofMicrocytic anemia-likely iron deficiency anemia secondary to colon cancer, persistent microcytic anemia;ferrous sulfate increased to twice daily 12/15/2017;hemoglobin in normal range  03/16/2018  3.Tubular villous adenoma on the sigmoid colon resection specimen 08/09/2015  Multiple tubular adenomas removed on a colonoscopy 11/16/2015  4. Right renal mass on CT 06/04/2016-suspicious for renal cell carcinoma;stable on CT 06/24/2017;referred to urology  Stable on CT 01/11/2019  5. History ofneutropenia secondary to chemotherapy  6.Pain secondary to local recurrence of colon cancer-improved  7.Abdominal pain. CT abdomen/pelvis 07/24/2018- inflammatory changes and mild dilatation of multiple small bowel loops in the lower abdomen and pelvis. Unchanged metastatic implant in the left paracolic gutter; unchanged periportal lymphadenopathy; hepatic steatosis; stable liver ablation site; unchanged 2.7 cm enhancing right renal mass.Resolved.  8.Severe macrocytic anemia 03/28/2019- vitamin B12 level at the low end of normal, hypersegmented neutrophils noted on peripheral blood smear, DAT positive, elevated LDH and bilirubin  Prednisone, 60 mg daily started 03/29/2019  1 unit of packed red blood cells 03/28/2019  Hemoglobin improved 03/30/2019, stable 03/31/2019  B12 initiated 03/30/2019  Hemoglobin improved   9.  Admission 04/24/2019 with presyncope 10.  Urinary tract infection 04/24/2019 11.  Bilateral lower extremity DVTs on Doppler 04/25/2019- right peroneal, left posterior tibial and left peroneal  Lovenox started  Ms. Rettinger appears to be improving.  She is not having significant output through her colostomy since starting Imodium.  She is eating and drinking better.  Patient was found to have bilateral DVTs.  Recommendations: 1.  Continue intravenous hydration 2.  Monitor input/output including stool output 3.  Continue scheduled Imodium 4.  Lovenox 1 mg/kg every 12 hours 5.  Continue prednisone, will taper as an outpatient 6.  Replete potassium per hospitalist   LOS: 1 day   Mikey Bussing, NP   04/26/2019, 9:54 AM   Ms. Canaday has experienced  decreased stool output over the past 12 hours.  I recommend continuing Imodium, increasing oral intake and ambulation as tolerated. I recommend continuing Lovenox for the lower extremity deep vein thromboses.  She can start with twice daily dosing and then convert to once daily dosing after 1-2 weeks.   She should be ready for discharge today or tomorrow.  Outpatient follow-up will be scheduled at the Cancer center.

## 2019-04-26 NOTE — Telephone Encounter (Signed)
Scheduled appt per 6/09 sch message - unable to reach patient. Left message with appt date and time

## 2019-04-26 NOTE — Progress Notes (Addendum)
[facility] PROGRESS NOTE  Nichole Cross ESP:[id number] DOB: [birth year] DOA: 04/24/2019 PCP: Nichole Rail, MD   Narrative: 78 y/o w ? Metastatic colon cancer s/p lap colostomy + port 03/02/2019-initiated FOLFOX 04/04/2019-  volume depletion from ostomy in the office managed 6/1, 6/2 2020 Right renal mass with stability on CT Chemotherapy related neutropenia Tubular adenoma status post colonoscopy/removal Bimodal anemia microcytic plus macrocytic-B12 given, transfused in the past month, started on prednisone per oncologist  Admit [facility] H67 2020 volume depletion, significant orthostasis blood pressures 80s, anorexia X1 week Labs as per admitting physician?  UTI  Work-up revealed bilateral DVTs  A & Plan Volume depletion in setting of ostomy hyper output Metastatic colon cancer--s/p resection colocolonic recurrence at anastomosis in proximal rectum 02/2019/FOLFOX Severe malnutrition and cancer related cachexia BMI of 19-states has lost about 15 pounds since surgery 02/2019  Volume depletion still not resolved and not near baseline although improved BUN/creatinine trends Oral steroids at this time stopped stress dosing Continue scheduled Imodium Mild hypokalemia Continue potassium replacement orally recheck labs a.m. in addition to magnesium Bilateral DVT Likely etiology = immobility, cancer underlying  Twice daily Lovenox and then daily dosing as per Dr. Benay Cross Macrocytic anemia Previously on prednisone for this indication B12 is normal Await methylmalonic acid Right renal mass under surveillance Tubular adenoma history  Hypothyroidism  DVT therapeutic Lovenox-nursing aware to teach patient to use Code Status: Full code at this time communication: Patient wishes to update her daughter on her own Disposition Plan: Likely inpatient until azotemia is relieved-patient will be returning home based on therapy assessment   Nichole Heming, MD  [facility] Via [employer] app OR  [email or web address] 7PM-7AM contact night coverage as above 04/26/2019, 2:31 PM  LOS: 1 day   Consultants:  Oncologist  Procedures:  None  Antimicrobials:  None  Interval history/Subjective:  Coherent feels a little stronger sitting up at the bed not as dizzy No chest pain no fever No nausea no vomiting Ostomy has put out some stool today which is greenish and semi-formed  Objective:  Vitals:  Vitals:   04/26/19 0818 04/26/19 1356  BP: 127/85 120/71  Pulse: 80 65  Resp:  14  Temp:  98 F (36.7 C)  SpO2: 100% 100%    Exam:  Coherent pleasant no distress Chest clear Abdomen soft, ostomy left lower quadrant greenish to mushy breslow stage VI-VII Lower extremity edema + cannot appreciate cord or tenderness Neurologically intact ROM intact to major joints   I have personally reviewed the following:  DATA   Labs:  BUN/creatinine now down to 30/0.7-->28/0.7  B12 1423  Hemoglobin 8.7 down from 9.6-->8.5  Platelets 157-->153   Imaging studies:  Venous duplex 6/8+  left femoral DVT and right sided DVT in addition   Scheduled Meds: . alteplase  2 mg Intracatheter Once  . enoxaparin (LOVENOX) injection  55 mg Subcutaneous Q12H  . feeding supplement (ENSURE ENLIVE)  237 mL Oral BID BM  . ferrous sulfate  325 mg Oral Q breakfast  . levothyroxine  150 mcg Oral Daily  . loperamide  2 mg Oral QID  . potassium chloride  40 mEq Oral Daily  . predniSONE  20 mg Oral Q breakfast  . sodium chloride flush  10-40 mL Intracatheter Q12H  . sterile water (preservative free)  5 mL Injection Once  . vitamin B-12  1,000 mcg Oral Daily   Continuous Infusions: . sodium chloride 125 mL/hr at 04/26/19 1403  . 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

## 2019-04-27 DIAGNOSIS — R55 Syncope and collapse: Secondary | ICD-10-CM

## 2019-04-27 DIAGNOSIS — E038 Other specified hypothyroidism: Secondary | ICD-10-CM

## 2019-04-27 LAB — CBC WITH DIFFERENTIAL/PLATELET
Abs Immature Granulocytes: 1.2 10*3/uL — ABNORMAL HIGH (ref 0.00–0.07)
Basophils Absolute: 0.1 10*3/uL (ref 0.0–0.1)
Basophils Relative: 1 %
Eosinophils Absolute: 0 10*3/uL (ref 0.0–0.5)
Eosinophils Relative: 0 %
HCT: 29 % — ABNORMAL LOW (ref 36.0–46.0)
Hemoglobin: 8.9 g/dL — ABNORMAL LOW (ref 12.0–15.0)
Immature Granulocytes: 20 %
Lymphocytes Relative: 8 %
Lymphs Abs: 0.5 10*3/uL — ABNORMAL LOW (ref 0.7–4.0)
MCH: 32.1 pg (ref 26.0–34.0)
MCHC: 30.7 g/dL (ref 30.0–36.0)
MCV: 104.7 fL — ABNORMAL HIGH (ref 80.0–100.0)
Monocytes Absolute: 0.3 10*3/uL (ref 0.1–1.0)
Monocytes Relative: 5 %
Neutro Abs: 4 10*3/uL (ref 1.7–7.7)
Neutrophils Relative %: 66 %
Platelets: 164 10*3/uL (ref 150–400)
RBC: 2.77 MIL/uL — ABNORMAL LOW (ref 3.87–5.11)
RDW: 17.7 % — ABNORMAL HIGH (ref 11.5–15.5)
WBC: 6.1 10*3/uL (ref 4.0–10.5)
nRBC: 0 % (ref 0.0–0.2)

## 2019-04-27 LAB — BASIC METABOLIC PANEL
Anion gap: 4 — ABNORMAL LOW (ref 5–15)
BUN: 25 mg/dL — ABNORMAL HIGH (ref 8–23)
CO2: 19 mmol/L — ABNORMAL LOW (ref 22–32)
Calcium: 7.6 mg/dL — ABNORMAL LOW (ref 8.9–10.3)
Chloride: 113 mmol/L — ABNORMAL HIGH (ref 98–111)
Creatinine, Ser: 0.7 mg/dL (ref 0.44–1.00)
GFR calc Af Amer: >60 mL/min (ref >60)
GFR calc non Af Amer: >60 mL/min (ref >60)
Glucose, Bld: 92 mg/dL (ref 70–99)
Potassium: 3.9 mmol/L (ref 3.5–5.1)
Sodium: 136 mmol/L (ref 135–145)

## 2019-04-27 LAB — RETICULOCYTES
Immature Retic Fract: 37 % — ABNORMAL HIGH (ref 2.3–15.9)
RBC.: 2.77 MIL/uL — ABNORMAL LOW (ref 3.87–5.11)
Retic Count, Absolute: 48.2 10*3/uL (ref 19.0–186.0)
Retic Ct Pct: 1.7 % (ref 0.4–3.1)

## 2019-04-27 LAB — MAGNESIUM: Magnesium: 1.8 mg/dL (ref 1.7–2.4)

## 2019-04-27 MED ORDER — DIPHENOXYLATE-ATROPINE 2.5-0.025 MG/5ML PO LIQD
5.0000 mL | Freq: Four times a day (QID) | ORAL | Status: DC
  Administered 2019-04-27 – 2019-04-28 (×6): 5 mL via ORAL
  Filled 2019-04-27 (×6): qty 5

## 2019-04-27 MED ORDER — ENOXAPARIN SODIUM 100 MG/ML ~~LOC~~ SOLN: 1.5000 mg/kg | SUBCUTANEOUS | Status: DC

## 2019-04-27 MED ORDER — PREDNISONE 10 MG PO TABS
10.0000 mg | ORAL_TABLET | Freq: Every day | ORAL | Status: DC
  Administered 2019-04-28 – 2019-04-30 (×3): 10 mg via ORAL
  Filled 2019-04-27 (×3): qty 1

## 2019-04-27 MED ORDER — ENOXAPARIN SODIUM 80 MG/0.8ML ~~LOC~~ SOLN
80.0000 mg | SUBCUTANEOUS | Status: DC
  Administered 2019-04-27 – 2019-04-30 (×4): 80 mg via SUBCUTANEOUS
  Filled 2019-04-27 (×4): qty 0.8

## 2019-04-27 MED ORDER — MAGNESIUM SULFATE 4 GM/100ML IV SOLN
4.0000 g | Freq: Once | INTRAVENOUS | Status: AC
  Administered 2019-04-27: 4 g via INTRAVENOUS
  Filled 2019-04-27: qty 100

## 2019-04-27 MED ORDER — PSYLLIUM 95 % PO PACK
1.0000 | PACK | Freq: Every day | ORAL | Status: DC
  Administered 2019-04-27 – 2019-04-30 (×4): 1 via ORAL
  Filled 2019-04-27 (×4): qty 1

## 2019-04-27 NOTE — Progress Notes (Signed)
Pt was given demonstration on how to self-administer Lovenox Sq injection; health teaching includes hand hygiene, common injection sites to give and also instructed to report physician if there is any signs of bleeding; pt's did return demonstration with question answered; pt verbalized understanding;

## 2019-04-27 NOTE — Progress Notes (Signed)
PROGRESS NOTE    Nichole Cross  OAC:[id number] DOB: 07-15-1941 DOA: 04/24/2019 PCP: [name], MD    Brief Narrative:  78 y/o w ? Metastatic colon cancer s/p lap colostomy + port 03/02/2019-initiated FOLFOX 04/04/2019-  volume depletion from ostomy in the office managed 6/1, 6/2 2020 Right renal mass with stability on CT Chemotherapy related neutropenia Tubular adenoma status post colonoscopy/removal Bimodal anemia microcytic plus macrocytic-B12 given, transfused in the past month, started on prednisone per oncologist  Admit [facility] H67 2020 volume depletion, significant orthostasis blood pressures 80s, anorexia X1 week Labs as per admitting physician?  UTI  Work-up revealed bilateral DVTs   Assessment & Plan:   Principal Problem:   Syncope Active Problems:   Hypothyroidism   Adenocarcinoma of colon metastatic to liver (HCC)   Dehydration   Acute lower UTI   Severe dehydration  1 near syncope /volume depletion in the setting of high output ostomy Patient noted to have high ostomy output and noted to have significant orthostasis with blood pressures in the 80s as well as anorexia x1 week.  Patient noted to have increased high ostomy output.  Patient noted to have a ostomy output of 1.2 L over the past 24 hours.  Patient was on IV fluids which have subsequently been discontinued to assess to see how patient does.  Patient on scheduled Imodium.  Metamucil added to current regimen.  Oncology.  Will discontinue Imodium and placed on scheduled Lomotil.  If no improvement in continued high ostomy output may need to start patient on Questran.  Follow.  2.  Bacteria in urine/probable UTI Urinalysis done was concerning for UTI.  Patient denies any dysuria.  Patient status post 4 days of IV Rocephin.  Urine cultures negative.  Discontinue IV Rocephin.  3.  Severe dehydration Secondary to problem #1.  Patient hydrated with IV fluids.  IV fluids have been saline locked.  Follow.  4.   Bilateral DVT Likely secondary to immobility and underlying cancer.  Patient on full dose Lovenox which will be adjusted to daily dosing per oncology, Dr. Benay Spice.  Outpatient follow-up with hematology/oncology.  5.  Right renal mass under surveillance  6.  Hypothyroidism TSH of 11.458.Marland Kitchen  Outpatient follow-up with repeat thyroid function studies in about 4 to 6 weeks.  7.  Hypokalemia Likely secondary to high ostomy output.  Magnesium at 1.8.  Replete magnesium.  Potassium at 3.9.  Follow.  8.  Adenocarcinoma of the colon with mets to the liver Patient undergoing chemotherapy, status post transverse colostomy and Port-A-Cath placement in 03/07/2019, started chemotherapy FOLFOX cycle 1 on 04/04/2019.  Per oncology.  9.  Macrocytic anemia Previously on prednisone.  Prednisone resumed.  Follow.   DVT prophylaxis: Lovenox Code Status: Full Family Communication: Updated patient.  No family at bedside. Disposition Plan: Home when clinically improved.   Consultants:   Oncology: Dr.Sherrill  Procedures:   Lower extremity Doppler 04/25/2019    Antimicrobials:   IV Rocephin 04/24/2019 >>>> 04/27/2019   Subjective: Patient sitting up in bed.  States still with watery loose stools.  Feeling somewhat better.  Tolerating oral intake.  Denies any chest pain or shortness of breath.  Emesis.  Objective: Vitals:   04/26/19 0818 04/26/19 1356 04/26/19 2314 04/27/19 0652  BP: 127/85 120/71 127/75 133/72  Pulse: 80 65 (!) 59 (!) 58  Resp:  14 16 16   Temp:  98 F (36.7 C) 98 F (36.7 C) 98 F (36.7 C)  TempSrc:  Oral Oral Oral  SpO2: 100% 100% 100% 100%  Weight:      Height:        Intake/Output Summary (Last 24 hours) at 04/27/2019 1233 Last data filed at 04/27/2019 1032 Gross per 24 hour  Intake 3458.06 ml  Output 1551 ml  Net 1907.06 ml   Filed Weights   04/24/19 0430  Weight: 56.1 kg    Examination:  General exam: Appears calm and comfortable. Respiratory system: Clear  to auscultation. Respiratory effort normal. Cardiovascular system: S1 & S2 heard, RRR. No JVD, murmurs, rubs, gallops or clicks. No pedal edema. Gastrointestinal system: Abdomen is nondistended, soft and nontender.Colostomy with watery stool.  Positive bowel sounds.  No rebound.  No guarding.  Central nervous system: Alert and oriented. No focal neurological deficits. Extremities: Symmetric 5 x 5 power. Skin: No rashes, lesions or ulcers Psychiatry: Judgement and insight appear normal. Mood & affect appropriate.     Data Reviewed: I have personally reviewed following labs and imaging studies  CBC: Recent Labs  Lab 04/24/19 0509 04/24/19 1258 04/25/19 0405 04/26/19 0345 04/27/19 0429  WBC 8.5 6.7 6.6 6.4 6.1  NEUTROABS 6.0  --   --   --  4.0  HGB 11.3* 9.6* 8.7* 8.5* 8.9*  HCT 35.9* 31.4* 28.1* 27.4* 29.0*  MCV 102.3* 103.3* 104.1* 105.0* 104.7*  PLT 206 173 157 153 518   Basic Metabolic Panel: Recent Labs  Lab 04/24/19 0509 04/24/19 1258 04/25/19 0405 04/26/19 0345 04/27/19 0429  NA 132*  --  136 135 136  K 3.7  --  3.5 3.3* 3.9  CL 101  --  108 109 113*  CO2 21*  --  21* 20* 19*  GLUCOSE 104*  --  98 107* 92  BUN 36*  --  30* 28* 25*  CREATININE 1.07* 0.74 0.73 0.79 0.70  CALCIUM 8.2*  --  7.4* 7.4* 7.6*  MG  --   --   --   --  1.8   GFR: Estimated Creatinine Clearance: 53.8 mL/min (by C-G formula based on SCr of 0.7 mg/dL). Liver Function Tests: Recent Labs  Lab 04/24/19 0509 04/26/19 0345  AST 20 17  ALT 12 10  ALKPHOS 310* 283*  BILITOT 0.5 0.2*  PROT 5.7* 4.2*  ALBUMIN 2.6* 1.9*   No results for input(s): LIPASE, AMYLASE in the last 168 hours. No results for input(s): AMMONIA in the last 168 hours. Coagulation Profile: No results for input(s): INR, PROTIME in the last 168 hours. Cardiac Enzymes: Recent Labs  Lab 04/24/19 1258  TROPONINI <0.03   BNP (last 3 results) No results for input(s): PROBNP in the last 8760 hours. HbA1C: Recent Labs     04/24/19 1258  HGBA1C 3.8*   CBG: No results for input(s): GLUCAP in the last 168 hours. Lipid Profile: No results for input(s): CHOL, HDL, LDLCALC, TRIG, CHOLHDL, LDLDIRECT in the last 72 hours. Thyroid Function Tests: Recent Labs    04/24/19 1258 04/25/19 0405  TSH 11.458*  --   FREET4  --  1.16   Anemia Panel: Recent Labs    04/24/19 1258 04/27/19 0429  VITAMINB12 1,423*  --   FOLATE 18.1  --   RETICCTPCT  --  1.7   Sepsis Labs: No results for input(s): PROCALCITON, LATICACIDVEN in the last 168 hours.  Recent Results (from the past 240 hour(s))  SARS Coronavirus 2 (CEPHEID - Performed in [facility] hospital lab), Hosp Order     Status: None   Collection Time: 04/24/19  9:59 AM  Result Value Ref Range Status   SARS Coronavirus 2 NEGATIVE NEGATIVE Final    Comment: (NOTE) If result is NEGATIVE SARS-CoV-2 target nucleic acids are NOT DETECTED. The SARS-CoV-2 RNA is generally detectable in upper and lower  respiratory specimens during the acute phase of infection. The lowest  concentration of SARS-CoV-2 viral copies this assay can detect is 250  copies / mL. A negative result does not preclude SARS-CoV-2 infection  and should not be used as the sole basis for treatment or other  patient management decisions.  A negative result may occur with  improper specimen collection / handling, submission of specimen other  than nasopharyngeal swab, presence of viral mutation(s) within the  areas targeted by this assay, and inadequate number of viral copies  (<250 copies / mL). A negative result must be combined with clinical  observations, patient history, and epidemiological information. If result is POSITIVE SARS-CoV-2 target nucleic acids are DETECTED. The SARS-CoV-2 RNA is generally detectable in upper and lower  respiratory specimens dur ing the acute phase of infection.  Positive  results are indicative of active infection with SARS-CoV-2.  Clinical  correlation  with patient history and other diagnostic information is  necessary to determine patient infection status.  Positive results do  not rule out bacterial infection or co-infection with other viruses. If result is PRESUMPTIVE POSTIVE SARS-CoV-2 nucleic acids MAY BE PRESENT.   A presumptive positive result was obtained on the submitted specimen  and confirmed on repeat testing.  While 2019 novel coronavirus  (SARS-CoV-2) nucleic acids may be present in the submitted sample  additional confirmatory testing may be necessary for epidemiological  and / or clinical management purposes  to differentiate between  SARS-CoV-2 and other Sarbecovirus currently known to infect humans.  If clinically indicated additional testing with an alternate test  methodology [phone]) is advised. The SARS-CoV-2 RNA is generally  detectable in upper and lower respiratory sp ecimens during the acute  phase of infection. The expected result is Negative. Fact Sheet for Patients:  StrictlyIdeas.no Fact Sheet for Healthcare Providers: BankingDealers.co.za This test is not yet approved or cleared by the [place] FDA and has been authorized for detection and/or diagnosis of SARS-CoV-2 by FDA under an Emergency Use Authorization (EUA).  This EUA will remain in effect (meaning this test can be used) for the duration of the COVID-19 declaration under Section 564(b)(1) of the Act, 21 U.S.C. section 360bbb-3(b)(1), unless the authorization is terminated or revoked sooner. Performed at [facility], [place] [address] [place], [place] [address]   Urine culture     Status: None   Collection Time: 04/24/19 11:51 AM  Result Value Ref Range Status   Specimen Description   Final    URINE, CLEAN CATCH Performed at [facility], [place] [address], [place], [place] [address]    Special Requests   Final    NONE Performed at [facility], [place] [address], [place],  [address]    Culture   Final    Multiple bacterial morphotypes present, none predominant. Suggest appropriate recollection if clinically indicated.   Report Status 04/26/2019 FINAL  Final         Radiology Studies: No results found.      Scheduled Meds: . alteplase  2 mg Intracatheter Once  . diphenoxylate-atropine  5 mL Oral QID  . enoxaparin (LOVENOX) injection  80 mg Subcutaneous Q24H  . feeding supplement (ENSURE ENLIVE)  237 mL Oral BID BM  .  ferrous sulfate  325 mg Oral Q breakfast  . levothyroxine  150 mcg Oral Daily  . potassium chloride  40 mEq Oral Daily  . [START ON 04/28/2019] predniSONE  10 mg Oral Q breakfast  . psyllium  1 packet Oral Daily  . sodium chloride flush  10-40 mL Intracatheter Q12H  . sterile water (preservative free)  5 mL Injection Once  . vitamin B-12  1,000 mcg Oral Daily   Continuous Infusions: . sodium chloride 10 mL/hr at 04/27/19 1000  . cefTRIAXone (ROCEPHIN)  IV 1 g (04/26/19 1035)     LOS: 2 days    Time spent: 35 mins    Irine Seal, MD [facility]  If 7PM-7AM, please contact night-coverage [email or web address] 04/27/2019, 12:33 PM

## 2019-04-27 NOTE — TOC Initial Note (Signed)
Transition of Care [facility]) - Initial/Assessment Note    Patient Details  Name: Nichole Cross MRN: [id number] Date of Birth: [birth year]  Transition of [facility]) CM/SW Contact:    Vernita Tague, Marjie Skiff, RN Phone Number: 04/27/2019, 1:49 PM  Clinical Narrative:                   Expected Discharge Plan: [place] Barriers to Discharge: Continued Medical Work up   Patient Goals and CMS Choice Patient states their goals for this hospitalization and ongoing recovery are:: to get better CMS Medicare.gov Compare Post Acute Care list provided to:: Other (Comment Required)(Pt chose agency that she has used recently) Choice offered to / list presented to : Patient  Expected Discharge Plan and Services Expected Discharge Plan: [place]   Discharge Planning Services: CM Consult Post Acute Care Choice: [place]: RN, PT [facility] Agency: [facility] ([place]) Date [facility] Agency Contacted: 04/27/19 Time [place]: [phone] Representative spoke with at [place]: Santiago [employer] rights manager)  Prior Living Arrangements/Services     Patient language and need for interpreter reviewed:: Yes Do you feel safe going back to the place where you live?: Yes      Need for Family Participation in Patient Care: Yes (Comment) Care giver support system in place?: Yes (comment)   Criminal Activity/Legal Involvement Pertinent to Current Situation/Hospitalization: No - Comment as needed  Activities of Daily Living Home Assistive Devices/Equipment: Walker (specify type), Cane (specify quad or straight)(4 wheel walker, Straight cane ) ADL Screening (condition at time of admission) Patient's cognitive ability adequate to safely complete daily activities?: Yes Is the patient deaf or have difficulty hearing?: No Does the patient have difficulty seeing, even when wearing glasses/contacts?: No Does the patient have difficulty  concentrating, remembering, or making decisions?: No Patient able to express need for assistance with ADLs?: Yes Does the patient have difficulty dressing or bathing?: No Independently performs ADLs?: Yes (appropriate for developmental age)(at home) Does the patient have difficulty walking or climbing stairs?: Yes Weakness of Legs: Both Weakness of Arms/Hands: None  Permission Sought/Granted Permission sought to share information with : Facility Art therapist granted to share information with : Yes, Verbal Permission Granted     Permission granted to share info w AGENCY: [facility]        Emotional Assessment Appearance:: Appears stated age Attitude/Demeanor/Rapport: Self-Confident Affect (typically observed): Accepting        Admission diagnosis:  Dehydration [E86.0] Lightheadedness [R42] At high risk for injury related to fall [Z91.81] Patient Active Problem List   Diagnosis Date Noted  . Severe dehydration 04/25/2019  . Syncope 04/24/2019  . Dehydration 04/24/2019  . Acute lower UTI 04/24/2019  . Port-A-Cath in place 04/19/2019  . Goals of care, counseling/discussion 03/22/2019  . Local recurrence of rectal cancer  ([place]) 01/09/2017  . Adenocarcinoma of colon metastatic to liver ([place]) 07/25/2016  . Osteopenia, h/o OP 06/07/2016  . Anemia, iron deficiency 06/07/2016  . Sigmoid colon cancer s/p lap assisted sigmoid colectomy 08/09/15 08/09/2015  . Overweight (BMI 25.0-29.9) 09/10/2012  . S/P left UKR 09/09/2012  . ELEVATED BLOOD PRESSURE WITHOUT DIAGNOSIS OF HYPERTENSION 09/30/2010  . PHARYNGITIS 02/17/2008  . GANGLION OF TENDON SHEATH 12/20/2007  . FATTY LIVER DISEASE 08/13/2007  . Hypothyroidism 08/02/2007  . CYSTITIS  08/02/2007  . Osteoarthritis 08/02/2007   PCP:  [name], MD Pharmacy:   [facility] [address] [place] - [address] [place] [place] [address] Phone: [phone] Fax:  [phone]     Social Determinants of Health (SDOH) Interventions    Readmission Risk Interventions Readmission Risk Prevention Plan 04/27/2019  Transportation Screening Complete  Medication Review Press photographer) Complete  HRI or [place] Complete  SW Recovery Care/Counseling Consult Complete  [place] Not Applicable  Some recent data might be hidden

## 2019-04-27 NOTE — Progress Notes (Signed)
IP PROGRESS NOTE  Subjective:   Nichole Cross reports adequate pain control with oxycodone.  No bleeding.  She feels that she can self inject Lovenox.  She says the stool is intermittently loose.  She was able to ambulate yesterday.  Objective: Vital signs in last 24 hours: Blood pressure 133/72, pulse (!) 58, temperature 98 F (36.7 C), temperature source Oral, resp. rate 16, height '5\' 7"'  (1.702 m), weight 123 lb 10.9 oz (56.1 kg), SpO2 100 %.  Intake/Output from previous day: 06/09 0701 - 06/10 0700 In: 2978.1 [P.O.:240; I.V.:2642.4; IV Piggyback:95.7] Out: 1200 [Stool:1200]  Physical Exam:  HEENT: No thrush  Abdomen: Nontender, no hepatomegaly, liquid brown stool in the ostomy bag Extremities: 1+ edema at the left lower leg; trace edema to the right lower extremity  Portacath/PICC-without erythema  Lab Results: Recent Labs    04/26/19 0345 04/27/19 0429  WBC 6.4 6.1  HGB 8.5* 8.9*  HCT 27.4* 29.0*  PLT 153 164    BMET Recent Labs    04/26/19 0345 04/27/19 0429  NA 135 136  K 3.3* 3.9  CL 109 113*  CO2 20* 19*  GLUCOSE 107* 92  BUN 28* 25*  CREATININE 0.79 0.70  CALCIUM 7.4* 7.6*    Lab Results  Component Value Date   CEA1 178.58 (H) 03/28/2019     Medications: I have reviewed the patient's current medications.  Assessment/Plan: 1.Adenocarcinoma the distal sigmoid colon, poorly differentiated, stage II (T3 N0), status post a laparoscopic assisted sigmoid colectomy 08/09/2015  No loss of mismatch repair protein expression  Elevated preoperative CEA  Staging CT of the abdomen and pelvis 06/22/2015 with no evidence of distant metastatic disease  Mildly elevated CEA 02/23/2017And 04/17/2016  CTs chest, abdomen, and pelvis on 06/04/2016, 31m left upper lobe nodule-no comparison available, new hypodense lesion in the left liver, solid Right renal mass  MRI 06/10/2016-2 enhancing lesions in the liver consistent with metastatic disease, segment 2 and  segment 5. Solid enhancing right renal lesion consistent with a renal cell carcinoma  Radiofrequency ablation of 2 liver lesions on 07/25/2016  Cycle 1 adjuvant Xeloda 08/18/2016  Cycle 2 Xeloda 09/08/2016  CEA improved 09/26/2016  Cycle 3 Xeloda held 09/29/2016 due to unexplained abdominal pain,referred for CT  CT abdomen/pelvis 09/30/2016 with a long segment of bowel wall thickening involving the distal ileum; lesion left hepatic lobe similar to slightly smaller following ablation; lesion posterior right hepatic lobe elongated favored to represent treatment tract.  Cycle 3 Xeloda 10/10/2016 (dose reduced due to drug induced enteritis following cycle 2)discontinued 10/15/2016 secondary to abdominal pain.  CT in while the emergency room with abdominal pain 10/15/2016 revealed descending colitis  Colonoscopy 10/30/2016 confirmed a mass at the: colo-colonicanastomosis  Cycle 4 Xeloda 11/03/2016  01/09/2017 status post a low anterior resection for removal of locally recurrent tumor at the sigmoid anastomosis, resection margins negative, tumor invades pericolonic soft tissue, 12 benign lymph nodes;MSI stable. Foundation 1 testing- KRAS G13D, MSS, tumor mutation burden-3,BRAFwild-type  Cycle 5 Xeloda 02/12/2017  Cycle 6 Xeloda 03/12/2017, Xeloda dose reduced to 1000 mg twice daily on 03/18/2017  Cycle 7 Xeloda 04/09/2017  Cycle 8 Xeloda 05/12/2017  Restaging CTs 06/24/2017-no evidence of metastatic disease involving the chest. Post ablation changes in the liver. No new hepatic metastatic disease.  Restaging CTs 12/14/2017- stable liver ablation sites, indeterminate nodule adjacent to the descending colon, stable left renal mass, changes of early cirrhosis, enlarged porta hepatis node-likely reactive  MRI abdomen 04/22/2018- enlargement of 1.7 cm left paracolic gutter  mass, stable 1 cm extrahepatic left liver capsule lesion, no evidence of local tumor recurrence at the ablation  sites, no new liver lesions, stable right kidney mass  Colonoscopy 05/19/2018- mass in the rectum at the anastomotic site beginning at 3 cm from the anal verge, biopsy confirmed invasive adenocarcinoma  CT 06/07/2018-stable liver ablation sites, enlarging peritoneal lesion at the left pericolic gutter, stable nodule in the sigmoid mesocolon, stable prominent periportal lymph nodes, no evidence of metastatic disease to the chest  Radiation/Xeloda beginning 06/21/2018  Xeloda dose reduced to 1000 mg twice daily days of radiationbeginning8/16/2019 due to neutropenia  Radiation/Xeloda completed 08/02/2018  CTs 10/07/2018- new 6 mm right upper lobe nodule, stable ablation changes in the left and right liver, stable superior pole right renal mass, increased soft tissue thickening the presacral region, stable 12 mm porta hepatis node, increase in size of soft tissue nodule adjacent to the descending colon-2.1 x 2.2 cm  CTs 01/11/2019- anterior right upper lobe subpleural nodule-smaller, no suspicious lung nodules, stable ablation defects in the liver, stable right kidney mass, wall thickening at the suture line in the lower pelvis with extension to the presacral region/vaginal fornix, slight enlargement of left pericolic gutter peritoneal implant  Sigmoidoscopy 01/27/2019- mass at 5 cm from the anal verge, biopsy confirmed adenocarcinoma  Laparoscopic transverse colostomy and Port-A-Cath placement 03/02/2019- tumor noted to be studding the descending colon  Cycle 1 FOLFOX 04/04/2019    2.History ofMicrocytic anemia-likely iron deficiency anemia secondary to colon cancer, persistent microcytic anemia;ferrous sulfate increased to twice daily 12/15/2017;hemoglobin in normal range 03/16/2018  3.Tubular villous adenoma on the sigmoid colon resection specimen 08/09/2015  Multiple tubular adenomas removed on a colonoscopy 11/16/2015  4. Right renal mass on CT 06/04/2016-suspicious for renal cell  carcinoma;stable on CT 06/24/2017;referred to urology  Stable on CT 01/11/2019  5. History ofneutropenia secondary to chemotherapy  6.Pain secondary to local recurrence of colon cancer-improved  7.Abdominal pain. CT abdomen/pelvis 07/24/2018- inflammatory changes and mild dilatation of multiple small bowel loops in the lower abdomen and pelvis. Unchanged metastatic implant in the left paracolic gutter; unchanged periportal lymphadenopathy; hepatic steatosis; stable liver ablation site; unchanged 2.7 cm enhancing right renal mass.Resolved.  8.Severe macrocytic anemia 03/28/2019- vitamin B12 level at the low end of normal, hypersegmented neutrophils noted on peripheral blood smear, DAT positive, elevated LDH and bilirubin  Prednisone, 60 mg daily started 03/29/2019  1 unit of packed red blood cells 03/28/2019  Hemoglobin improved 03/30/2019, stable 03/31/2019  B12 initiated 03/30/2019  Hemoglobin improved   9.  Admission 04/24/2019 with presyncope 10.  Urinary tract infection 04/24/2019 11.  Bilateral lower extremity DVTs on Doppler 04/25/2019- right peroneal, left posterior tibial and left peroneal  Lovenox started  Nichole Cross appears unchanged.  She continues to have a significant volume of loose stool output from the ostomy.  She is maintained on Imodium and iron.  I will add Metamucil.  The hemoglobin has drifted down in the hospital.  I will check a reticulocyte count and taper the prednisone.  I will convert the Lovenox to once daily dosing.  I think she should have a trial of time off of IV fluids prior to discharge.  Recommendations: 1.  Hold IV hydration and monitor fluid balance 2.  Monitor input/output including stool output 3.  Continue scheduled Imodium, add fiber 4.  Convert Lovenox to once daily dosing 5.  Check reticulocyte count, taper prednisone   LOS: 2 days   [name], MD   04/27/2019, 7:41 AM

## 2019-04-28 ENCOUNTER — Ambulatory Visit: Payer: Medicare Other | Admitting: Nurse Practitioner

## 2019-04-28 ENCOUNTER — Telehealth: Payer: Self-pay | Admitting: Oncology

## 2019-04-28 DIAGNOSIS — Z9049 Acquired absence of other specified parts of digestive tract: Secondary | ICD-10-CM

## 2019-04-28 DIAGNOSIS — I82442 Acute embolism and thrombosis of left tibial vein: Secondary | ICD-10-CM

## 2019-04-28 DIAGNOSIS — I82453 Acute embolism and thrombosis of peroneal vein, bilateral: Secondary | ICD-10-CM

## 2019-04-28 LAB — BASIC METABOLIC PANEL
Anion gap: 10 (ref 5–15)
BUN: 23 mg/dL (ref 8–23)
CO2: 16 mmol/L — ABNORMAL LOW (ref 22–32)
Calcium: 7.2 mg/dL — ABNORMAL LOW (ref 8.9–10.3)
Chloride: 106 mmol/L (ref 98–111)
Creatinine, Ser: 0.72 mg/dL (ref 0.44–1.00)
GFR calc Af Amer: >60 mL/min (ref >60)
GFR calc non Af Amer: >60 mL/min (ref >60)
Glucose, Bld: 83 mg/dL (ref 70–99)
Potassium: 3.9 mmol/L (ref 3.5–5.1)
Sodium: 132 mmol/L — ABNORMAL LOW (ref 135–145)

## 2019-04-28 LAB — MAGNESIUM: Magnesium: 2.3 mg/dL (ref 1.7–2.4)

## 2019-04-28 MED ORDER — LOPERAMIDE HCL 2 MG PO CAPS
2.0000 mg | ORAL_CAPSULE | Freq: Four times a day (QID) | ORAL | Status: DC
  Administered 2019-04-28 – 2019-04-30 (×7): 2 mg via ORAL
  Filled 2019-04-28 (×7): qty 1

## 2019-04-28 NOTE — Progress Notes (Addendum)
IP PROGRESS NOTE  Subjective:   Pain controlled with current medications.  No bleeding.  She was able to self inject Lovenox yesterday.  Still having intermittent liquid/loose stools.  She was started on Lomotil late yesterday.  She has no other complaints this morning.  Objective: Vital signs in last 24 hours: Blood pressure 107/71, pulse 66, temperature 98.5 F (36.9 C), temperature source Oral, resp. rate 14, height _0  (1.702 m), weight 123 lb 10.9 oz (56.1 kg), SpO2 100 %.  Intake/Output from previous day: 06/10 0701 - 06/11 0700 In: 1200 [P.O.:1200] Out: 1455 [Urine:4; Stool:1451]  Physical Exam:  HEENT: No thrush  Abdomen: Nontender, no hepatomegaly, formed and liquid brown stool in the ostomy bag Extremities: 1+ edema at the left lower leg; trace edema to the right lower extremity  Portacath/PICC-without erythema  Lab Results: Recent Labs    04/26/19 0345 04/27/19 0429  WBC 6.4 6.1  HGB 8.5* 8.9*  HCT 27.4* 29.0*  PLT 153 164    BMET Recent Labs    04/27/19 0429 04/28/19 0510  NA 136 132*  K 3.9 3.9  CL 113* 106  CO2 19* 16*  GLUCOSE 92 83  BUN 25* 23  CREATININE 0.70 0.72  CALCIUM 7.6* 7.2*    Lab Results  Component Value Date   CEA1 178.58 (H) 03/28/2019     Medications: I have reviewed the patient's current medications.  Assessment/Plan: 1.Adenocarcinoma the distal sigmoid colon, poorly differentiated, stage II (T3 N0), status post a laparoscopic assisted sigmoid colectomy 08/09/2015  No loss of mismatch repair protein expression  Elevated preoperative CEA  Staging CT of the abdomen and pelvis 06/22/2015 with no evidence of distant metastatic disease  Mildly elevated CEA 02/23/2017And 04/17/2016  CTs chest, abdomen, and pelvis on 06/04/2016, 26m left upper lobe nodule-no comparison available, new hypodense lesion in the left liver, solid Right renal mass  MRI 06/10/2016-2 enhancing lesions in the liver consistent with metastatic  disease, segment 2 and segment 5. Solid enhancing right renal lesion consistent with a renal cell carcinoma  Radiofrequency ablation of 2 liver lesions on 07/25/2016  Cycle 1 adjuvant Xeloda 08/18/2016  Cycle 2 Xeloda 09/08/2016  CEA improved 09/26/2016  Cycle 3 Xeloda held 09/29/2016 due to unexplained abdominal pain,referred for CT  CT abdomen/pelvis 09/30/2016 with a long segment of bowel wall thickening involving the distal ileum; lesion left hepatic lobe similar to slightly smaller following ablation; lesion posterior right hepatic lobe elongated favored to represent treatment tract.  Cycle 3 Xeloda 10/10/2016 (dose reduced due to drug induced enteritis following cycle 2)discontinued 10/15/2016 secondary to abdominal pain.  CT in while the emergency room with abdominal pain 10/15/2016 revealed descending colitis  Colonoscopy 10/30/2016 confirmed a mass at the: colo-colonicanastomosis  Cycle 4 Xeloda 11/03/2016  01/09/2017 status post a low anterior resection for removal of locally recurrent tumor at the sigmoid anastomosis, resection margins negative, tumor invades pericolonic soft tissue, 12 benign lymph nodes;MSI stable. Foundation 1 testing- KRAS G13D, MSS, tumor mutation burden-3,BRAFwild-type  Cycle 5 Xeloda 02/12/2017  Cycle 6 Xeloda 03/12/2017, Xeloda dose reduced to 1000 mg twice daily on 03/18/2017  Cycle 7 Xeloda 04/09/2017  Cycle 8 Xeloda 05/12/2017  Restaging CTs 06/24/2017-no evidence of metastatic disease involving the chest. Post ablation changes in the liver. No new hepatic metastatic disease.  Restaging CTs 12/14/2017- stable liver ablation sites, indeterminate nodule adjacent to the descending colon, stable left renal mass, changes of early cirrhosis, enlarged porta hepatis node-likely reactive  MRI abdomen 04/22/2018- enlargement of 1.7  cm left paracolic gutter mass, stable 1 cm extrahepatic left liver capsule lesion, no evidence of local tumor  recurrence at the ablation sites, no new liver lesions, stable right kidney mass  Colonoscopy 05/19/2018- mass in the rectum at the anastomotic site beginning at 3 cm from the anal verge, biopsy confirmed invasive adenocarcinoma  CT 06/07/2018-stable liver ablation sites, enlarging peritoneal lesion at the left pericolic gutter, stable nodule in the sigmoid mesocolon, stable prominent periportal lymph nodes, no evidence of metastatic disease to the chest  Radiation/Xeloda beginning 06/21/2018  Xeloda dose reduced to 1000 mg twice daily days of radiationbeginning8/16/2019 due to neutropenia  Radiation/Xeloda completed 08/02/2018  CTs 10/07/2018- new 6 mm right upper lobe nodule, stable ablation changes in the left and right liver, stable superior pole right renal mass, increased soft tissue thickening the presacral region, stable 12 mm porta hepatis node, increase in size of soft tissue nodule adjacent to the descending colon-2.1 x 2.2 cm  CTs 01/11/2019- anterior right upper lobe subpleural nodule-smaller, no suspicious lung nodules, stable ablation defects in the liver, stable right kidney mass, wall thickening at the suture line in the lower pelvis with extension to the presacral region/vaginal fornix, slight enlargement of left pericolic gutter peritoneal implant  Sigmoidoscopy 01/27/2019- mass at 5 cm from the anal verge, biopsy confirmed adenocarcinoma  Laparoscopic transverse colostomy and Port-A-Cath placement 03/02/2019- tumor noted to be studding the descending colon  Cycle 1 FOLFOX 04/04/2019    2.History ofMicrocytic anemia-likely iron deficiency anemia secondary to colon cancer, persistent microcytic anemia;ferrous sulfate increased to twice daily 12/15/2017;hemoglobin in normal range 03/16/2018  3.Tubular villous adenoma on the sigmoid colon resection specimen 08/09/2015  Multiple tubular adenomas removed on a colonoscopy 11/16/2015  4. Right renal mass on CT  06/04/2016-suspicious for renal cell carcinoma;stable on CT 06/24/2017;referred to urology  Stable on CT 01/11/2019  5. History ofneutropenia secondary to chemotherapy  6.Pain secondary to local recurrence of colon cancer-improved  7.Abdominal pain. CT abdomen/pelvis 07/24/2018- inflammatory changes and mild dilatation of multiple small bowel loops in the lower abdomen and pelvis. Unchanged metastatic implant in the left paracolic gutter; unchanged periportal lymphadenopathy; hepatic steatosis; stable liver ablation site; unchanged 2.7 cm enhancing right renal mass.Resolved.  8.Severe macrocytic anemia 03/28/2019- vitamin B12 level at the low end of normal, hypersegmented neutrophils noted on peripheral blood smear, DAT positive, elevated LDH and bilirubin  Prednisone, 60 mg daily started 03/29/2019  1 unit of packed red blood cells 03/28/2019  Hemoglobin improved 03/30/2019, stable 03/31/2019  B12 initiated 03/30/2019  Hemoglobin improved   9.  Admission 04/24/2019 with presyncope 10.  Urinary tract infection 04/24/2019 11.  Bilateral lower extremity DVTs on Doppler 04/25/2019- right peroneal, left posterior tibial and left peroneal  Lovenox started  Ms. Ahuja appears unchanged.  She continues to have a significant volume of loose stool output from the ostomy.  She is currently on Imodium and Metamucil.  Lomotil was added late yesterday.  CBC was not checked today, but her hemoglobin was 8.9 yesterday which is overall stable.  Reticulocyte count is low and the bilirubin is normal.  This suggests resolution of hemolysis.  Recommendations: 1.  Continue to hold IV hydration and monitor fluid balance 2.  Monitor input/output including stool output 3.  Continue scheduled Imodium and Metamucil.  Lomotil was added late yesterday. 4.  Continue Lovenox daily 5.  Continue prednisone at current dose, will taper as an outpatient 6.  Outpatient follow-up will be scheduled at the  Cancer center for next week  LOS: 3 days   Mikey Bussing, NP   04/28/2019, 9:03 AM

## 2019-04-28 NOTE — Telephone Encounter (Signed)
Left message re 6/15 appointments. Not able to reach patient or leave message at secondary number.

## 2019-04-28 NOTE — Care Management Important Message (Signed)
Important Message  Patient Details IM Letter given to Cookie McGibboney RN to present to the Patient Name: Nichole Cross MRN: [id number] Date of Birth: 1941-01-16   Medicare Important Message Given:  Yes    [name] 04/28/2019, 11:38 AM

## 2019-04-28 NOTE — Progress Notes (Signed)
PROGRESS NOTE    Katti Pelle Rawdon  NID:[id number] DOB: 25-Jan-1941 DOA: 04/24/2019 PCP: [name], MD    Brief Narrative:  78 y/o w ? Metastatic colon cancer s/p lap colostomy + port 03/02/2019-initiated FOLFOX 04/04/2019-  volume depletion from ostomy in the office managed 6/1, 6/2 2020 Right renal mass with stability on CT Chemotherapy related neutropenia Tubular adenoma status post colonoscopy/removal Bimodal anemia microcytic plus macrocytic-B12 given, transfused in the past month, started on prednisone per oncologist  Admit [facility] H67 2020 volume depletion, significant orthostasis blood pressures 80s, anorexia X1 week Labs as per admitting physician?  UTI  Work-up revealed bilateral DVTs   Assessment & Plan:   Principal Problem:   Syncope Active Problems:   Hypothyroidism   Adenocarcinoma of colon metastatic to liver (HCC)   Dehydration   Acute lower UTI   Severe dehydration   Near syncope  1 near syncope /volume depletion in the setting of high output ostomy Patient noted to have high ostomy output and noted to have significant orthostasis with blood pressures in the 80s as well as anorexia x1 week.  Patient noted to have increased high ostomy output.  Patient noted to have a ostomy output of 1.45 L over the past 24 hours.  Patient was on IV fluids which have subsequently been discontinued to assess to see how patient does.  Patient was on scheduled Imodium.  Metamucil added to current regimen.  Imodium was discontinued yesterday and patient placed on scheduled Lomotil.  Imodium resumed per oncology.  If no significant improvement may need to add Questran.  Follow.  2.  Bacteria in urine/probable UTI Urinalysis done was concerning for UTI.  Patient denies any dysuria.  Patient status post 4 days of IV Rocephin.  Urine cultures negative.  Discontinued IV Rocephin.  3.  Severe dehydration Secondary to problem #1.  Patient hydrated with IV fluids.  IV fluids have been  saline locked.  Monitor off IV fluids.  Follow.  4.  Bilateral DVT Likely secondary to immobility and underlying cancer.  Patient on full dose Lovenox which will be adjusted to daily dosing per oncology, Dr. Benay Spice.  Outpatient follow-up with hematology/oncology.  5.  Right renal mass under surveillance  6.  Hypothyroidism TSH of 11.458.Marland Kitchen  Outpatient follow-up with repeat thyroid function studies in about 4 to 6 weeks.  7.  Hypokalemia Likely secondary to high ostomy output.  Magnesium at 2.3.  Repleted magnesium.  Potassium at 3.9.  Follow.  8.  Adenocarcinoma of the colon with mets to the liver Patient undergoing chemotherapy, status post transverse colostomy and Port-A-Cath placement in 03/07/2019, started chemotherapy FOLFOX cycle 1 on 04/04/2019.  Per oncology.  9.  Macrocytic anemia Previously on prednisone.  Prednisone resumed.  Follow.   DVT prophylaxis: Lovenox Code Status: Full Family Communication: Updated patient.  No family at bedside. Disposition Plan: Home when clinically improved hopefully in the next 24 to 48 hours.   Consultants:   Oncology: Dr.Sherrill  Procedures:   Lower extremity Doppler 04/25/2019    Antimicrobials:   IV Rocephin 04/24/2019 >>>> 04/27/2019   Subjective: Patient laying in bed.  Denies any chest pain or shortness of breath.  States still with loose watery stools.   Objective: Vitals:   04/27/19 0652 04/27/19 1326 04/27/19 2249 04/28/19 0430  BP: 133/72 134/78 121/71 107/71  Pulse: (!) 58 66 70 66  Resp: 16 18 16 14   Temp: 98 F (36.7 C) 98.6 F (37 C) 98.3 F (36.8 C) 98.5 F (  36.9 C)  TempSrc: Oral Oral Oral Oral  SpO2: 100% 100% 100% 100%  Weight:      Height:        Intake/Output Summary (Last 24 hours) at 04/28/2019 1211 Last data filed at 04/28/2019 0852 Gross per 24 hour  Intake 720 ml  Output 1354 ml  Net -634 ml   Filed Weights   04/24/19 0430  Weight: 56.1 kg    Examination:  General exam: Appears calm  and comfortable. Respiratory system: CTAB.  No wheezing, no crackles, no rhonchi.  Respiratory effort normal. Cardiovascular system: Regular rate rhythm no murmurs rubs or gallops.  No JVD.  No lower extremity edema.  Gastrointestinal system: Abdomen is soft, nondistended, soft and nontender.Colostomy with loose watery stool.  Positive bowel sounds.  No rebound.  No guarding.  Central nervous system: Alert and oriented. No focal neurological deficits. Extremities: 1+ left lower extremity edema, trace right lower extremity edema. Skin: No rashes, lesions or ulcers Psychiatry: Judgement and insight appear normal. Mood & affect appropriate.     Data Reviewed: I have personally reviewed following labs and imaging studies  CBC: Recent Labs  Lab 04/24/19 0509 04/24/19 1258 04/25/19 0405 04/26/19 0345 04/27/19 0429  WBC 8.5 6.7 6.6 6.4 6.1  NEUTROABS 6.0  --   --   --  4.0  HGB 11.3* 9.6* 8.7* 8.5* 8.9*  HCT 35.9* 31.4* 28.1* 27.4* 29.0*  MCV 102.3* 103.3* 104.1* 105.0* 104.7*  PLT 206 173 157 153 048   Basic Metabolic Panel: Recent Labs  Lab 04/24/19 0509 04/24/19 1258 04/25/19 0405 04/26/19 0345 04/27/19 0429 04/28/19 0510  NA 132*  --  136 135 136 132*  K 3.7  --  3.5 3.3* 3.9 3.9  CL 101  --  108 109 113* 106  CO2 21*  --  21* 20* 19* 16*  GLUCOSE 104*  --  98 107* 92 83  BUN 36*  --  30* 28* 25* 23  CREATININE 1.07* 0.74 0.73 0.79 0.70 0.72  CALCIUM 8.2*  --  7.4* 7.4* 7.6* 7.2*  MG  --   --   --   --  1.8 2.3   GFR: Estimated Creatinine Clearance: 53.8 mL/min (by C-G formula based on SCr of 0.72 mg/dL). Liver Function Tests: Recent Labs  Lab 04/24/19 0509 04/26/19 0345  AST 20 17  ALT 12 10  ALKPHOS 310* 283*  BILITOT 0.5 0.2*  PROT 5.7* 4.2*  ALBUMIN 2.6* 1.9*   No results for input(s): LIPASE, AMYLASE in the last 168 hours. No results for input(s): AMMONIA in the last 168 hours. Coagulation Profile: No results for input(s): INR, PROTIME in the last 168  hours. Cardiac Enzymes: Recent Labs  Lab 04/24/19 1258  TROPONINI <0.03   BNP (last 3 results) No results for input(s): PROBNP in the last 8760 hours. HbA1C: No results for input(s): HGBA1C in the last 72 hours. CBG: No results for input(s): GLUCAP in the last 168 hours. Lipid Profile: No results for input(s): CHOL, HDL, LDLCALC, TRIG, CHOLHDL, LDLDIRECT in the last 72 hours. Thyroid Function Tests: No results for input(s): TSH, T4TOTAL, FREET4, T3FREE, THYROIDAB in the last 72 hours. Anemia Panel: Recent Labs    04/27/19 0429  RETICCTPCT 1.7   Sepsis Labs: No results for input(s): PROCALCITON, LATICACIDVEN in the last 168 hours.  Recent Results (from the past 240 hour(s))  SARS Coronavirus 2 (CEPHEID - Performed in [place] hospital lab), Hosp Order     Status: None  Collection Time: 04/24/19  9:59 AM   Specimen: Nasopharyngeal Swab  Result Value Ref Range Status   SARS Coronavirus 2 NEGATIVE NEGATIVE Final    Comment: (NOTE) If result is NEGATIVE SARS-CoV-2 target nucleic acids are NOT DETECTED. The SARS-CoV-2 RNA is generally detectable in upper and lower  respiratory specimens during the acute phase of infection. The lowest  concentration of SARS-CoV-2 viral copies this assay can detect is 250  copies / mL. A negative result does not preclude SARS-CoV-2 infection  and should not be used as the sole basis for treatment or other  patient management decisions.  A negative result may occur with  improper specimen collection / handling, submission of specimen other  than nasopharyngeal swab, presence of viral mutation(s) within the  areas targeted by this assay, and inadequate number of viral copies  (<250 copies / mL). A negative result must be combined with clinical  observations, patient history, and epidemiological information. If result is POSITIVE SARS-CoV-2 target nucleic acids are DETECTED. The SARS-CoV-2 RNA is generally detectable in upper and lower   respiratory specimens dur ing the acute phase of infection.  Positive  results are indicative of active infection with SARS-CoV-2.  Clinical  correlation with patient history and other diagnostic information is  necessary to determine patient infection status.  Positive results do  not rule out bacterial infection or co-infection with other viruses. If result is PRESUMPTIVE POSTIVE SARS-CoV-2 nucleic acids MAY BE PRESENT.   A presumptive positive result was obtained on the submitted specimen  and confirmed on repeat testing.  While 2019 novel coronavirus  (SARS-CoV-2) nucleic acids may be present in the submitted sample  additional confirmatory testing may be necessary for epidemiological  and / or clinical management purposes  to differentiate between  SARS-CoV-2 and other Sarbecovirus currently known to infect humans.  If clinically indicated additional testing with an alternate test  methodology [phone]) is advised. The SARS-CoV-2 RNA is generally  detectable in upper and lower respiratory sp ecimens during the acute  phase of infection. The expected result is Negative. Fact Sheet for Patients:  StrictlyIdeas.no Fact Sheet for Healthcare Providers: BankingDealers.co.za This test is not yet approved or cleared by the [place] FDA and has been authorized for detection and/or diagnosis of SARS-CoV-2 by FDA under an Emergency Use Authorization (EUA).  This EUA will remain in effect (meaning this test can be used) for the duration of the COVID-19 declaration under Section 564(b)(1) of the Act, 21 U.S.C. section 360bbb-3(b)(1), unless the authorization is terminated or revoked sooner. Performed at [facility], [place] [address], [place], [place] [address]   Urine culture     Status: None   Collection Time: 04/24/19 11:51 AM   Specimen: Urine, Clean Catch  Result Value Ref Range Status   Specimen Description    Final    URINE, CLEAN CATCH Performed at [facility], [place] [address] [place], [place] [address]    Special Requests   Final    NONE Performed at [facility], [place] [address] [place], [place] [address]    Culture   Final    Multiple bacterial morphotypes present, none predominant. Suggest appropriate recollection if clinically indicated.   Report Status 04/26/2019 FINAL  Final         Radiology Studies: No results found.      Scheduled Meds: . alteplase  2 mg Intracatheter Once  . diphenoxylate-atropine  5 mL Oral QID  . enoxaparin (LOVENOX) injection  80 mg Subcutaneous Q24H  . feeding supplement (ENSURE ENLIVE)  237 mL Oral BID BM  . ferrous sulfate  325 mg Oral Q breakfast  . levothyroxine  150 mcg Oral Daily  . potassium chloride  40 mEq Oral Daily  . predniSONE  10 mg Oral Q breakfast  . psyllium  1 packet Oral Daily  . sodium chloride flush  10-40 mL Intracatheter Q12H  . sterile water (preservative free)  5 mL Injection Once  . vitamin B-12  1,000 mcg Oral Daily   Continuous Infusions: . sodium chloride 10 mL/hr at 04/27/19 1000     LOS: 3 days    Time spent: 35 mins    Irine Seal, MD [facility]  If 7PM-7AM, please contact night-coverage [email or web address] 04/28/2019, 12:11 PM

## 2019-04-28 NOTE — Plan of Care (Signed)
Pt is progressing and meeting her goals as far as healthcare and maintaining the ability to do her ADLs independently.

## 2019-04-28 NOTE — Progress Notes (Signed)
Pt resting in bed calmly. No acute distress.  Assessment unchanged. Pain administered as ordered. SRP,RN

## 2019-04-29 DIAGNOSIS — I82543 Chronic embolism and thrombosis of tibial vein, bilateral: Secondary | ICD-10-CM

## 2019-04-29 LAB — BASIC METABOLIC PANEL
Anion gap: 7 (ref 5–15)
BUN: 25 mg/dL — ABNORMAL HIGH (ref 8–23)
CO2: 18 mmol/L — ABNORMAL LOW (ref 22–32)
Calcium: 7.7 mg/dL — ABNORMAL LOW (ref 8.9–10.3)
Chloride: 105 mmol/L (ref 98–111)
Creatinine, Ser: 0.66 mg/dL (ref 0.44–1.00)
GFR calc Af Amer: >60 mL/min (ref >60)
GFR calc non Af Amer: >60 mL/min (ref >60)
Glucose, Bld: 76 mg/dL (ref 70–99)
Potassium: 4.4 mmol/L (ref 3.5–5.1)
Sodium: 130 mmol/L — ABNORMAL LOW (ref 135–145)

## 2019-04-29 MED ORDER — DIPHENOXYLATE-ATROPINE 2.5-0.025 MG/5ML PO LIQD
10.0000 mL | Freq: Four times a day (QID) | ORAL | Status: DC
  Administered 2019-04-29 – 2019-04-30 (×4): 10 mL via ORAL
  Filled 2019-04-29 (×4): qty 10

## 2019-04-29 MED ORDER — SODIUM BICARBONATE 8.4 % IV SOLN
INTRAVENOUS | Status: AC
  Administered 2019-04-29 (×2): via INTRAVENOUS
  Filled 2019-04-29 (×4): qty 150

## 2019-04-29 NOTE — Progress Notes (Signed)
PROGRESS NOTE    Nichole Cross  ZWC:[id number] DOB: 11/29/1940 DOA: 04/24/2019 PCP: [name], MD    Brief Narrative:  78 y/o w ? Metastatic colon cancer s/p lap colostomy + port 03/02/2019-initiated FOLFOX 04/04/2019-  volume depletion from ostomy in the office managed 6/1, 6/2 2020 Right renal mass with stability on CT Chemotherapy related neutropenia Tubular adenoma status post colonoscopy/removal Bimodal anemia microcytic plus macrocytic-B12 given, transfused in the past month, started on prednisone per oncologist  Admit [facility] H67 2020 volume depletion, significant orthostasis blood pressures 80s, anorexia X1 week Labs as per admitting physician?  UTI  Work-up revealed bilateral DVTs   Assessment & Plan:   Principal Problem:   Syncope Active Problems:   Hypothyroidism   Adenocarcinoma of colon metastatic to liver (HCC)   Dehydration   Acute lower UTI   Severe dehydration   Near syncope  1 near syncope /volume depletion in the setting of high output ostomy Patient noted to have high ostomy output and noted to have significant orthostasis with blood pressures in the 80s as well as anorexia x1 week.  Patient noted to have increased high ostomy output.  Patient noted to have a ostomy output of 800 cc over the past 24 hours.  Patient was on IV fluids which have subsequently been discontinued to assess to see how patient does.  Patient to be placed back on IV fluids as with complaints of weakness and increasing BUN concerning for possible dehydration.  Patient with poor oral intake.  Patient was on scheduled Imodium.  Metamucil added to current regimen.  Imodium was initially discontinued however has been resumed.  Increase scheduled Lomotil to 10 cc 4 times daily.  If no significant improvement may need to add Questran.  Follow.  2.  Bacteria in urine/probable UTI Urinalysis done was concerning for UTI.  Patient denies any dysuria.  Patient status post 4 days of IV Rocephin.   Urine cultures negative.  Discontinued IV Rocephin.  No further antibiotics needed at this time.  3.  Severe dehydration Secondary to problem #1.  Patient hydrated with IV fluids.  IV fluids have been saline locked.  Patient still with loose watery ostomy output.  BUN slowly trending back up.  Will place on gentle hydration for the next 24 hours.  Follow.  4.  Bilateral DVT Likely secondary to immobility and underlying cancer.  Patient on full dose Lovenox which will be adjusted to daily dosing per oncology, Dr. Benay Spice.  Outpatient follow-up with hematology/oncology.  5.  Right renal mass under surveillance  6.  Hypothyroidism TSH of 11.458.Marland Kitchen  Outpatient follow-up with repeat thyroid function studies in about 4 to 6 weeks.  7.  Hypokalemia Likely secondary to high ostomy output.  Magnesium at 2.3.  Repleted magnesium.  Potassium at 4.4.  Follow.  8.  Adenocarcinoma of the colon with mets to the liver Patient undergoing chemotherapy, status post transverse colostomy and Port-A-Cath placement in 03/07/2019, started chemotherapy FOLFOX cycle 1 on 04/04/2019.  Per oncology.  9.  Macrocytic anemia Previously on prednisone.  Prednisone resumed.  Follow.   DVT prophylaxis: Lovenox Code Status: Full Family Communication: Updated patient.  No family at bedside. Disposition Plan: Home with home health hopefully tomorrow.   Consultants:   Oncology: Dr.Sherrill  Procedures:   Lower extremity Doppler 04/25/2019    Antimicrobials:   IV Rocephin 04/24/2019 >>>> 04/27/2019   Subjective: Patient laying in bed.  Patient with poor oral intake.  Patient denies any chest pain or  shortness of breath.  Patient complaining of weakness today stating she does not feel daughters may be able to manage taking care of her at home.  Still with loose stool out of ostomy.   Objective: Vitals:   04/28/19 0430 04/28/19 1424 04/28/19 2144 04/29/19 0608  BP: 107/71 (!) 129/116 107/64 109/70  Pulse: 66 81  63 80  Resp: 14  14 12   Temp: 98.5 F (36.9 C) 98.1 F (36.7 C) 98.8 F (37.1 C) 98.1 F (36.7 C)  TempSrc: Oral Oral Oral Oral  SpO2: 100% 100% 98% 99%  Weight:      Height:        Intake/Output Summary (Last 24 hours) at 04/29/2019 1249 Last data filed at 04/29/2019 0801 Gross per 24 hour  Intake 120 ml  Output 551 ml  Net -431 ml   Filed Weights   04/24/19 0430  Weight: 56.1 kg    Examination:  General exam: Appears calm and comfortable. Respiratory system: Lungs clear to auscultation bilaterally.  No wheezes, no crackles, no rhonchi.  Normal respiratory effort. Cardiovascular system: RRR no murmurs rubs or gallops.  No JVD.  No lower extremity edema.  Gastrointestinal system: Abdomen is soft, nondistended, soft and nontender.Colostomy with loose watery stool.  Positive bowel sounds.  No rebound.  No guarding.  Central nervous system: Alert and oriented. No focal neurological deficits. Extremities: 1+ left lower extremity edema, trace right lower extremity edema. Skin: No rashes, lesions or ulcers Psychiatry: Judgement and insight appear normal. Mood & affect appropriate.     Data Reviewed: I have personally reviewed following labs and imaging studies  CBC: Recent Labs  Lab 04/24/19 0509 04/24/19 1258 04/25/19 0405 04/26/19 0345 04/27/19 0429  WBC 8.5 6.7 6.6 6.4 6.1  NEUTROABS 6.0  --   --   --  4.0  HGB 11.3* 9.6* 8.7* 8.5* 8.9*  HCT 35.9* 31.4* 28.1* 27.4* 29.0*  MCV 102.3* 103.3* 104.1* 105.0* 104.7*  PLT 206 173 157 153 144   Basic Metabolic Panel: Recent Labs  Lab 04/25/19 0405 04/26/19 0345 04/27/19 0429 04/28/19 0510 04/29/19 0450  NA 136 135 136 132* 130*  K 3.5 3.3* 3.9 3.9 4.4  CL 108 109 113* 106 105  CO2 21* 20* 19* 16* 18*  GLUCOSE 98 107* 92 83 76  BUN 30* 28* 25* 23 25*  CREATININE 0.73 0.79 0.70 0.72 0.66  CALCIUM 7.4* 7.4* 7.6* 7.2* 7.7*  MG  --   --  1.8 2.3  --    GFR: Estimated Creatinine Clearance: 53.8 mL/min (by C-G  formula based on SCr of 0.66 mg/dL). Liver Function Tests: Recent Labs  Lab 04/24/19 0509 04/26/19 0345  AST 20 17  ALT 12 10  ALKPHOS 310* 283*  BILITOT 0.5 0.2*  PROT 5.7* 4.2*  ALBUMIN 2.6* 1.9*   No results for input(s): LIPASE, AMYLASE in the last 168 hours. No results for input(s): AMMONIA in the last 168 hours. Coagulation Profile: No results for input(s): INR, PROTIME in the last 168 hours. Cardiac Enzymes: Recent Labs  Lab 04/24/19 1258  TROPONINI <0.03   BNP (last 3 results) No results for input(s): PROBNP in the last 8760 hours. HbA1C: No results for input(s): HGBA1C in the last 72 hours. CBG: No results for input(s): GLUCAP in the last 168 hours. Lipid Profile: No results for input(s): CHOL, HDL, LDLCALC, TRIG, CHOLHDL, LDLDIRECT in the last 72 hours. Thyroid Function Tests: No results for input(s): TSH, T4TOTAL, FREET4, T3FREE, THYROIDAB in the last 72  hours. Anemia Panel: Recent Labs    04/27/19 0429  RETICCTPCT 1.7   Sepsis Labs: No results for input(s): PROCALCITON, LATICACIDVEN in the last 168 hours.  Recent Results (from the past 240 hour(s))  SARS Coronavirus 2 (CEPHEID - Performed in [place] hospital lab), Hosp Order     Status: None   Collection Time: 04/24/19  9:59 AM   Specimen: Nasopharyngeal Swab  Result Value Ref Range Status   SARS Coronavirus 2 NEGATIVE NEGATIVE Final    Comment: (NOTE) If result is NEGATIVE SARS-CoV-2 target nucleic acids are NOT DETECTED. The SARS-CoV-2 RNA is generally detectable in upper and lower  respiratory specimens during the acute phase of infection. The lowest  concentration of SARS-CoV-2 viral copies this assay can detect is 250  copies / mL. A negative result does not preclude SARS-CoV-2 infection  and should not be used as the sole basis for treatment or other  patient management decisions.  A negative result may occur with  improper specimen collection / handling, submission of specimen other   than nasopharyngeal swab, presence of viral mutation(s) within the  areas targeted by this assay, and inadequate number of viral copies  (<250 copies / mL). A negative result must be combined with clinical  observations, patient history, and epidemiological information. If result is POSITIVE SARS-CoV-2 target nucleic acids are DETECTED. The SARS-CoV-2 RNA is generally detectable in upper and lower  respiratory specimens dur ing the acute phase of infection.  Positive  results are indicative of active infection with SARS-CoV-2.  Clinical  correlation with patient history and other diagnostic information is  necessary to determine patient infection status.  Positive results do  not rule out bacterial infection or co-infection with other viruses. If result is PRESUMPTIVE POSTIVE SARS-CoV-2 nucleic acids MAY BE PRESENT.   A presumptive positive result was obtained on the submitted specimen  and confirmed on repeat testing.  While 2019 novel coronavirus  (SARS-CoV-2) nucleic acids may be present in the submitted sample  additional confirmatory testing may be necessary for epidemiological  and / or clinical management purposes  to differentiate between  SARS-CoV-2 and other Sarbecovirus currently known to infect humans.  If clinically indicated additional testing with an alternate test  methodology [phone]) is advised. The SARS-CoV-2 RNA is generally  detectable in upper and lower respiratory sp ecimens during the acute  phase of infection. The expected result is Negative. Fact Sheet for Patients:  StrictlyIdeas.no Fact Sheet for Healthcare Providers: BankingDealers.co.za This test is not yet approved or cleared by the [place] FDA and has been authorized for detection and/or diagnosis of SARS-CoV-2 by FDA under an Emergency Use Authorization (EUA).  This EUA will remain in effect (meaning this test can be used) for the duration of the  COVID-19 declaration under Section 564(b)(1) of the Act, 21 U.S.C. section 360bbb-3(b)(1), unless the authorization is terminated or revoked sooner. Performed at [facility], [place] [address], [place], [place] [address]   Urine culture     Status: None   Collection Time: 04/24/19 11:51 AM   Specimen: Urine, Clean Catch  Result Value Ref Range Status   Specimen Description   Final    URINE, CLEAN CATCH Performed at [facility], [place] [address] [place], [place] [address]    Special Requests   Final    NONE Performed at [facility], [place] [address] [place], [place] [address]    Culture   Final    Multiple bacterial  morphotypes present, none predominant. Suggest appropriate recollection if clinically indicated.   Report Status 04/26/2019 FINAL  Final         Radiology Studies: No results found.      Scheduled Meds: . alteplase  2 mg Intracatheter Once  . diphenoxylate-atropine  10 mL Oral QID  . enoxaparin (LOVENOX) injection  80 mg Subcutaneous Q24H  . feeding supplement (ENSURE ENLIVE)  237 mL Oral BID BM  . ferrous sulfate  325 mg Oral Q breakfast  . levothyroxine  150 mcg Oral Daily  . loperamide  2 mg Oral QID  . potassium chloride  40 mEq Oral Daily  . predniSONE  10 mg Oral Q breakfast  . psyllium  1 packet Oral Daily  . sodium chloride flush  10-40 mL Intracatheter Q12H  . sterile water (preservative free)  5 mL Injection Once  . vitamin B-12  1,000 mcg Oral Daily   Continuous Infusions: . sodium chloride Stopped (04/29/19 1114)  .  sodium bicarbonate  infusion 1000 mL 100 mL/hr at 04/29/19 1114     LOS: 4 days    Time spent: 35 mins    Irine Seal, MD [facility]  If 7PM-7AM, please contact night-coverage [email or web address] 04/29/2019, 12:49 PM

## 2019-04-29 NOTE — Progress Notes (Signed)
OT Cancellation Note  Patient Details Name: Nichole Cross MRN: [id number] DOB: 10/22/1941   Cancelled Treatment:    Reason Eval/Treat Not Completed: Other (comment).  Imminent d/c order received. PT has recommended SNF.  I will not have a chance to evaluate her today. Will see her ASAP  Jozeph Persing 04/29/2019, 2:52 PM  Lesle Chris, OTR/L Acute Rehabilitation Services [phone] [facility] pager [phone] office 04/29/2019

## 2019-04-29 NOTE — Progress Notes (Signed)
Pt was able to walk with FWW in room and to bathroom today. Pt needed 2 assist to stand once standing pt was able to walk with standby. Pt did have some dizziness while ambulating.

## 2019-04-29 NOTE — Progress Notes (Signed)
Called and talked to pt daughter [name] to give updates.

## 2019-04-29 NOTE — Progress Notes (Signed)
Physical Therapy Treatment Patient Details Name: Nichole Cross MRN: [id number] DOB: [birth year] Today's Date: 04/29/2019    History of Present Illness 78 year old female with a history of anal carcinoma of distal sigmoid colon, undergoing chemotherapy post transverse colostomy and Port-A-Cath placement in 02/2019, started chemotherapy FOLFOX cycle 1 on 04/04/2019, macrocytic anemia presented to ED with dizziness and near syncopal episode this morning in the bathroom.  Pt admitted for dehydration and found to have acute bilateral DVTs    PT Comments    Pt assisted with mobility today and requiring RW and min assist.  Pt reports increased weakness and fatigue. Pt states she has mainly been in bed since previous PT session (not walking to bathroom  (purewick) and not up to recliner with nursing).  Pt reports she would prefer to go home however states her daughter is not able to assist her since she has become weaker.  Pt agreeable to SNF for rehab.   Follow Up Recommendations  SNF     Equipment Recommendations  None recommended by PT    Recommendations for Other Services       Precautions / Restrictions Precautions Precautions: Fall    Mobility  Bed Mobility Overal bed mobility: Needs Assistance Bed Mobility: Supine to Sit     Supine to sit: Min assist     General bed mobility comments: assist for trunk upright  Transfers Overall transfer level: Needs assistance Equipment used: Rolling walker (2 wheeled) Transfers: Sit to/from Stand Sit to Stand: Min assist         General transfer comment: verbal cues for technique, assist to rise  Ambulation/Gait Ambulation/Gait assistance: Min assist Gait Distance (Feet): 80 Feet Assistive device: Rolling walker (2 wheeled) Gait Pattern/deviations: Step-through pattern;Decreased stride length     General Gait Details: improved stability with RW today vs last visit however occasional min assist required due to  fatigue   Stairs             Wheelchair Mobility    Modified Rankin (Stroke Patients Only)       Balance           Standing balance support: Bilateral upper extremity supported Standing balance-Leahy Scale: Poor Standing balance comment: reliant on UEs                            Cognition Arousal/Alertness: Awake/alert Behavior During Therapy: Flat affect Overall Cognitive Status: Within Functional Limits for tasks assessed                                        Exercises      General Comments        Pertinent Vitals/Pain Pain Assessment: No/denies pain    Home Living                      Prior Function            PT Goals (current goals can now be found in the care plan section) Acute Rehab PT Goals PT Goal Formulation: With patient Time For Goal Achievement: 05/06/19 Potential to Achieve Goals: Good Progress towards PT goals: Progressing toward goals    Frequency    Min 3X/week      PT Plan Discharge plan needs to be updated    Co-evaluation  AM-PAC PT "6 Clicks" Mobility   Outcome Measure  Help needed turning from your back to your side while in a flat bed without using bedrails?: A Little Help needed moving from lying on your back to sitting on the side of a flat bed without using bedrails?: A Little Help needed moving to and from a bed to a chair (including a wheelchair)?: A Little Help needed standing up from a chair using your arms (e.g., wheelchair or bedside chair)?: A Little Help needed to walk in hospital room?: A Little Help needed climbing 3-5 steps with a railing? : A Lot 6 Click Score: 17    End of Session Equipment Utilized During Treatment: Gait belt Activity Tolerance: Patient limited by fatigue Patient left: in chair;with chair alarm set;with call bell/phone within reach   PT Visit Diagnosis: Other abnormalities of gait and mobility (R26.89)     Time:  0881-1031 PT Time Calculation (min) (ACUTE ONLY): 18 min  Charges:  $Gait Training: 8-22 mins                     Carmelia Bake, PT, DPT Acute Rehabilitation Services Office: [phone] Pager: [phone]  Trena Platt 04/29/2019, 12:58 PM

## 2019-04-29 NOTE — Progress Notes (Addendum)
IP PROGRESS NOTE  Subjective:   Still having intermittent liquid/loose stools. On Imodium, Lomotil, and Metamucil. Has not been getting out of bed much. Feels weak overall. Denies pain. She has no other complaints this morning.  Objective: Vital signs in last 24 hours: Blood pressure 109/70, pulse 80, temperature 98.1 F (36.7 C), temperature source Oral, resp. rate 12, height '5\' 7"'  (1.702 m), weight 123 lb 10.9 oz (56.1 kg), SpO2 99 %.  Intake/Output from previous day: 06/11 0701 - 06/12 0700 In: -  Out: 127 [Urine:1; Stool:800]  Physical Exam:  HEENT: No thrush  Abdomen: Nontender, no hepatomegaly, formed and liquid brown stool in the ostomy bag Extremities: 1+ edema at the left lower leg; trace edema to the right lower extremity  Portacath/PICC-without erythema  Lab Results: Recent Labs    04/27/19 0429  WBC 6.1  HGB 8.9*  HCT 29.0*  PLT 164    BMET Recent Labs    04/28/19 0510 04/29/19 0450  NA 132* 130*  K 3.9 4.4  CL 106 105  CO2 16* 18*  GLUCOSE 83 76  BUN 23 25*  CREATININE 0.72 0.66  CALCIUM 7.2* 7.7*    Lab Results  Component Value Date   CEA1 178.58 (H) 03/28/2019     Medications: I have reviewed the patient's current medications.  Assessment/Plan: 1.Adenocarcinoma the distal sigmoid colon, poorly differentiated, stage II (T3 N0), status post a laparoscopic assisted sigmoid colectomy 08/09/2015  No loss of mismatch repair protein expression  Elevated preoperative CEA  Staging CT of the abdomen and pelvis 06/22/2015 with no evidence of distant metastatic disease  Mildly elevated CEA 02/23/2017And 04/17/2016  CTs chest, abdomen, and pelvis on 06/04/2016, 47m left upper lobe nodule-no comparison available, new hypodense lesion in the left liver, solid Right renal mass  MRI 06/10/2016-2 enhancing lesions in the liver consistent with metastatic disease, segment 2 and segment 5. Solid enhancing right renal lesion consistent with a renal  cell carcinoma  Radiofrequency ablation of 2 liver lesions on 07/25/2016  Cycle 1 adjuvant Xeloda 08/18/2016  Cycle 2 Xeloda 09/08/2016  CEA improved 09/26/2016  Cycle 3 Xeloda held 09/29/2016 due to unexplained abdominal pain,referred for CT  CT abdomen/pelvis 09/30/2016 with a long segment of bowel wall thickening involving the distal ileum; lesion left hepatic lobe similar to slightly smaller following ablation; lesion posterior right hepatic lobe elongated favored to represent treatment tract.  Cycle 3 Xeloda 10/10/2016 (dose reduced due to drug induced enteritis following cycle 2)discontinued 10/15/2016 secondary to abdominal pain.  CT in while the emergency room with abdominal pain 10/15/2016 revealed descending colitis  Colonoscopy 10/30/2016 confirmed a mass at the: colo-colonicanastomosis  Cycle 4 Xeloda 11/03/2016  01/09/2017 status post a low anterior resection for removal of locally recurrent tumor at the sigmoid anastomosis, resection margins negative, tumor invades pericolonic soft tissue, 12 benign lymph nodes;MSI stable. Foundation 1 testing- KRAS G13D, MSS, tumor mutation burden-3,BRAFwild-type  Cycle 5 Xeloda 02/12/2017  Cycle 6 Xeloda 03/12/2017, Xeloda dose reduced to 1000 mg twice daily on 03/18/2017  Cycle 7 Xeloda 04/09/2017  Cycle 8 Xeloda 05/12/2017  Restaging CTs 06/24/2017-no evidence of metastatic disease involving the chest. Post ablation changes in the liver. No new hepatic metastatic disease.  Restaging CTs 12/14/2017- stable liver ablation sites, indeterminate nodule adjacent to the descending colon, stable left renal mass, changes of early cirrhosis, enlarged porta hepatis node-likely reactive  MRI abdomen 04/22/2018- enlargement of 1.7 cm left paracolic gutter mass, stable 1 cm extrahepatic left liver capsule lesion, no evidence  of local tumor recurrence at the ablation sites, no new liver lesions, stable right kidney mass  Colonoscopy  05/19/2018- mass in the rectum at the anastomotic site beginning at 3 cm from the anal verge, biopsy confirmed invasive adenocarcinoma  CT 06/07/2018-stable liver ablation sites, enlarging peritoneal lesion at the left pericolic gutter, stable nodule in the sigmoid mesocolon, stable prominent periportal lymph nodes, no evidence of metastatic disease to the chest  Radiation/Xeloda beginning 06/21/2018  Xeloda dose reduced to 1000 mg twice daily days of radiationbeginning8/16/2019 due to neutropenia  Radiation/Xeloda completed 08/02/2018  CTs 10/07/2018- new 6 mm right upper lobe nodule, stable ablation changes in the left and right liver, stable superior pole right renal mass, increased soft tissue thickening the presacral region, stable 12 mm porta hepatis node, increase in size of soft tissue nodule adjacent to the descending colon-2.1 x 2.2 cm  CTs 01/11/2019- anterior right upper lobe subpleural nodule-smaller, no suspicious lung nodules, stable ablation defects in the liver, stable right kidney mass, wall thickening at the suture line in the lower pelvis with extension to the presacral region/vaginal fornix, slight enlargement of left pericolic gutter peritoneal implant  Sigmoidoscopy 01/27/2019- mass at 5 cm from the anal verge, biopsy confirmed adenocarcinoma  Laparoscopic transverse colostomy and Port-A-Cath placement 03/02/2019- tumor noted to be studding the descending colon  Cycle 1 FOLFOX 04/04/2019    2.History ofMicrocytic anemia-likely iron deficiency anemia secondary to colon cancer, persistent microcytic anemia;ferrous sulfate increased to twice daily 12/15/2017;hemoglobin in normal range 03/16/2018  3.Tubular villous adenoma on the sigmoid colon resection specimen 08/09/2015  Multiple tubular adenomas removed on a colonoscopy 11/16/2015  4. Right renal mass on CT 06/04/2016-suspicious for renal cell carcinoma;stable on CT 06/24/2017;referred to urology  Stable on  CT 01/11/2019  5. History ofneutropenia secondary to chemotherapy  6.Pain secondary to local recurrence of colon cancer-improved  7.Abdominal pain. CT abdomen/pelvis 07/24/2018- inflammatory changes and mild dilatation of multiple small bowel loops in the lower abdomen and pelvis. Unchanged metastatic implant in the left paracolic gutter; unchanged periportal lymphadenopathy; hepatic steatosis; stable liver ablation site; unchanged 2.7 cm enhancing right renal mass.Resolved.  8.Severe macrocytic anemia 03/28/2019- vitamin B12 level at the low end of normal, hypersegmented neutrophils noted on peripheral blood smear, DAT positive, elevated LDH and bilirubin  Prednisone, 60 mg daily started 03/29/2019  1 unit of packed red blood cells 03/28/2019  Hemoglobin improved 03/30/2019, stable 03/31/2019  B12 initiated 03/30/2019  Hemoglobin improved   9.  Admission 04/24/2019 with presyncope 10.  Urinary tract infection 04/24/2019 11.  Bilateral lower extremity DVTs on Doppler 04/25/2019- right peroneal, left posterior tibial and left peroneal  Lovenox started  Ms. Kye appears unchanged.  She continues to have a significant volume of loose stool output from the ostomy. She is on Metamucil, Imodium, and Lomotil.   CBC was not checked today, however, her hemoglobin has been stable.  Reticulocyte count is low and the bilirubin is normal.  This suggests resolution of hemolysis.  Recommendations: 1.  Continue to hold IV hydration and monitor fluid balance 2.  Monitor input/output including stool output 3.  Continue Metamucil, Imodium, and Lomotil. May need to add tincture of opium if she continues to have significant loose stool. 4.  Continue Lovenox daily 5.  Continue prednisone at current dose, will taper as an outpatient 6.  Outpatient follow-up will be scheduled at the Cancer center for next week   LOS: 4 days   Mikey Bussing, NP   04/29/2019, 10:08 AM  Ms. Ganser  was interviewed  and examined.  Her overall status appears unchanged.  She continues to have liquid output from the ostomy, but the volume appears to have slowed over the past few days.  She feels "weak "and does not feel her family can care for her at home.  She agrees to skilled nursing facility placement for physical therapy.  She is scheduled for outpatient follow-up at the Cancer center on 05/02/2019.    I discussed the case with her daughter Butch Penny.  She indicates she and her sister will both be at home with Ms. Cotten.  They would like to have her return home.  They feel comfortable caring for her in the home and they can bring her to the Cancer center for an appointment on 05/02/2019. We will discuss the case with Dr. Grandville Silos.  Please call Oncology as needed over the weekend

## 2019-04-30 ENCOUNTER — Other Ambulatory Visit: Payer: Self-pay | Admitting: Oncology

## 2019-04-30 DIAGNOSIS — I82403 Acute embolism and thrombosis of unspecified deep veins of lower extremity, bilateral: Secondary | ICD-10-CM

## 2019-04-30 DIAGNOSIS — E039 Hypothyroidism, unspecified: Secondary | ICD-10-CM

## 2019-04-30 LAB — BASIC METABOLIC PANEL
Anion gap: 10 (ref 5–15)
BUN: 21 mg/dL (ref 8–23)
CO2: 23 mmol/L (ref 22–32)
Calcium: 7.6 mg/dL — ABNORMAL LOW (ref 8.9–10.3)
Chloride: 100 mmol/L (ref 98–111)
Creatinine, Ser: 0.59 mg/dL (ref 0.44–1.00)
GFR calc Af Amer: >60 mL/min (ref >60)
GFR calc non Af Amer: >60 mL/min (ref >60)
Glucose, Bld: 94 mg/dL (ref 70–99)
Potassium: 4 mmol/L (ref 3.5–5.1)
Sodium: 133 mmol/L — ABNORMAL LOW (ref 135–145)

## 2019-04-30 LAB — CBC
HCT: 28.3 % — ABNORMAL LOW (ref 36.0–46.0)
Hemoglobin: 8.7 g/dL — ABNORMAL LOW (ref 12.0–15.0)
MCH: 31.3 pg (ref 26.0–34.0)
MCHC: 30.7 g/dL (ref 30.0–36.0)
MCV: 101.8 fL — ABNORMAL HIGH (ref 80.0–100.0)
Platelets: 195 10*3/uL (ref 150–400)
RBC: 2.78 MIL/uL — ABNORMAL LOW (ref 3.87–5.11)
RDW: 17.7 % — ABNORMAL HIGH (ref 11.5–15.5)
WBC: 10.3 10*3/uL (ref 4.0–10.5)
nRBC: 0 % (ref 0.0–0.2)

## 2019-04-30 MED ORDER — HEPARIN SOD (PORK) LOCK FLUSH 100 UNIT/ML IV SOLN
500.0000 [IU] | INTRAVENOUS | Status: AC | PRN
  Administered 2019-04-30: 500 [IU]

## 2019-04-30 MED ORDER — PREDNISONE 10 MG PO TABS: 10.0000 mg | ORAL_TABLET | Freq: Every day | ORAL | 0 refills | Status: AC

## 2019-04-30 MED ORDER — PSYLLIUM 95 % PO PACK: 1.0000 | PACK | Freq: Every day | ORAL | 0 refills | Status: DC

## 2019-04-30 MED ORDER — ENOXAPARIN SODIUM 80 MG/0.8ML ~~LOC~~ SOLN: 80.0000 mg | SUBCUTANEOUS | 0 refills | Status: DC

## 2019-04-30 MED ORDER — DIPHENOXYLATE-ATROPINE 2.5-0.025 MG PO TABS: 2.0000 | ORAL_TABLET | Freq: Four times a day (QID) | ORAL | 0 refills | Status: DC

## 2019-04-30 MED ORDER — LOPERAMIDE HCL 2 MG PO CAPS: 2.0000 mg | ORAL_CAPSULE | Freq: Four times a day (QID) | ORAL | 0 refills | Status: AC

## 2019-04-30 NOTE — Evaluation (Signed)
Occupational Therapy Evaluation Patient Details Name: Nichole Cross MRN: [id number] DOB: 26-Dec-1940 Today's Date: 04/30/2019    History of Present Illness 78 year old female with a history of anal carcinoma of distal sigmoid colon, undergoing chemotherapy post transverse colostomy and Port-A-Cath placement in 02/2019, started chemotherapy FOLFOX cycle 1 on 04/04/2019, macrocytic anemia presented to ED with dizziness and near syncopal episode this morning in the bathroom.  Pt admitted for dehydration and found to have acute bilateral DVTs   Clinical Impression   One time eval as pt plans to DC home with [facility] this day.  Pt needed increased A this day but is sure daughter can A her at home. Will need A with all transfers as well as HHOT to ensure I and safety  Will defer further OT to [facility]    Follow Up Recommendations  Home health OT;Supervision/Assistance - 24 hour;SNF    Equipment Recommendations  None recommended by OT    Recommendations for Other Services       Precautions / Restrictions Precautions Precautions: Fall      Mobility Bed Mobility Overal bed mobility: Needs Assistance Bed Mobility: Supine to Sit     Supine to sit: Min assist Sit to supine: Min assist   General bed mobility comments: assist for trunk upright  Transfers Overall transfer level: Needs assistance Equipment used: Rolling walker (2 wheeled) Transfers: Sit to/from [facility] Sit to Stand: Mod assist;+2 safety/equipment;+2 physical assistance Stand pivot transfers: Mod assist;+2 physical assistance;+2 safety/equipment       General transfer comment: verbal cues for technique, assist to rise    Balance Overall balance assessment: Mild deficits observed, not formally tested;Needs assistance         Standing balance support: Bilateral upper extremity supported Standing balance-Leahy Scale: Poor Standing balance comment: static standing fair                            ADL either performed or assessed with clinical judgement   ADL Overall ADL's : Needs assistance/impaired     Grooming: Wash/dry face;Wash/dry hands;Set up;Sitting   Upper Body Bathing: Set up;Sitting   Lower Body Bathing: Maximal assistance;Sit to/from stand;Cueing for safety;Cueing for sequencing   Upper Body Dressing : Set up;Sitting   Lower Body Dressing: Maximal assistance;Sit to/from stand;Cueing for safety;Cueing for sequencing;Cueing for compensatory techniques   Toilet Transfer: Moderate assistance;Stand-pivot;Cueing for sequencing;+2 for physical assistance;+2 for safety/equipment   Toileting- Clothing Manipulation and Hygiene: Moderate assistance;Sit to/from stand;Cueing for safety;Cueing for sequencing;Cueing for compensatory techniques         General ADL Comments: Pt needed extra A with sit to stand this day.                  Pertinent Vitals/Pain Pain Assessment: No/denies pain     Hand Dominance     Extremity/Trunk Assessment Upper Extremity Assessment Upper Extremity Assessment: Generalized weakness           Communication Communication Communication: No difficulties                 Home Living Family/patient expects to be discharged to:: Private residence Living Arrangements: Children Available Help at Discharge: Family;Available 24 hours/day Type of Home: House Home Access: Stairs to enter [employer] of Steps: 4 Entrance Stairs-Rails: Right Home Layout: One level     Bathroom Shower/Tub: Tub/shower unit         Home Equipment: Environmental consultant - 2 wheels;Cane - single point  Additional Comments: daughter can assist as needed      Prior Functioning/Environment Level of Independence: Independent                 OT Problem List: Decreased strength;Impaired balance (sitting and/or standing);Decreased knowledge of use of DME or AE;Decreased activity tolerance      OT Treatment/Interventions:      OT  Goals(Current goals can be found in the care plan section) Acute Rehab OT Goals Patient Stated Goal: home this day. Declined rehab at SNF stating daughter can A OT Goal Formulation: With patient  OT Frequency:      AM-PAC OT "6 Clicks" Daily Activity     Outcome Measure Help from another person eating meals?: None Help from another person taking care of personal grooming?: None Help from another person toileting, which includes using toliet, bedpan, or urinal?: A Lot Help from another person bathing (including washing, rinsing, drying)?: A Little Help from another person to put on and taking off regular upper body clothing?: A Little Help from another person to put on and taking off regular lower body clothing?: A Lot 6 Click Score: 18   End of Session Equipment Utilized During Treatment: Rolling walker Nurse Communication: Mobility status  Activity Tolerance: Patient tolerated treatment well Patient left: in bed;with call bell/phone within reach;with bed alarm set  OT Visit Diagnosis: Unsteadiness on feet (R26.81);Other abnormalities of gait and mobility (R26.89);Muscle weakness (generalized) (M62.81);History of falling (Z91.81)                Time: 1350-1410 OT Time Calculation (min): 20 min Charges:  OT General Charges $OT Visit: 1 Visit OT Evaluation $OT Eval Moderate Complexity: 1 Mod  Kari Baars, OT Acute Rehabilitation Services [phone] Office- [phone], Edwena Felty D 04/30/2019, 4:40 PM

## 2019-04-30 NOTE — Discharge Summary (Signed)
Physician Discharge Summary  Nichole Cross VOH:[id number] DOB: 05-30-1941 DOA: 04/24/2019  PCP: [name], MD  Admit date: 04/24/2019 Discharge date: 04/30/2019  Time spent: 50 minutes  Recommendations for Outpatient Follow-up:  1. Follow-up with Dr. Benay Spice, oncology as scheduled on 05/02/2019.  Basic metabolic profile as well as magnesium levels will need to be obtained on follow-up.  Ostomy output will need to be followed up upon.   Discharge Diagnoses:  Principal Problem:   Near syncope Active Problems:   Leg DVT (deep venous thromboembolism), acute, bilateral (HCC)   Hypothyroidism   Adenocarcinoma of colon metastatic to liver (HCC)   Dehydration   Acute lower UTI   Severe dehydration   Discharge Condition: Stable and improved  Diet recommendation: Regular  Filed Weights   04/24/19 0430  Weight: 56.1 kg    History of present illness:  Per Dr Tana Coast Patient is a 78 year old female with a history of anal carcinoma of distal sigmoid colon, undergoing chemotherapy post transverse colostomy and Port-A-Cath placement in 02/2019, started chemotherapy FOLFOX cycle 1 on 04/04/2019, macrocytic anemia presented to ED with dizziness and near syncopal episode this morning in the bathroom.  Patient reports that she has not been eating and drinking well in the last 2 to 3 days due to loss of appetite.  She denied any fever chills abdominal pain, vomiting or diarrhea. She had issues with high output ostomy in the last 1 week. In ED, patient received 2 L IV fluid boluses, however SBP still in 80s.  [facility] service was consulted for admission.  COVID test pending  ED work-up/course:  In ED temp 98, respiratory rate 13, heart rate 96, BP on arrival 84/63, O2 sats 100% on room air Sodium 132, potassium 3.7, BUN 36, creatinine 1.0 WBCs 8.5, hemoglobin 11.3 UA positive for UTI   Hospital Course:  1 near syncope /volume depletion in the setting of high output ostomy Patient  noted to have high ostomy output and noted to have significant orthostasis with blood pressures in the 80s as well as anorexia x1 week.  Patient noted to have increased high ostomy output.  Patient was placed on IV fluids due to dehydration, complaints of weakness and noted to have poor oral intake.  Patient initially placed on scheduled Imodium.  Metamucil was subsequently added.  Lomotil was added and dose increased to 10 mL 4 times daily.  Patient had decreased volume of loose stools.  Patient be discharged home on scheduled Imodium, scheduled Lomotil and daily Metamucil.  Outpatient follow-up with oncology.   2.  Bacteria in urine/probable UTI Urinalysis done was concerning for UTI.  Patient denied any dysuria.  Patient status post 4 days of IV Rocephin.  Urine cultures negative.  Discontinued IV Rocephin.  No further antibiotics needed at this time.  3.  Severe dehydration Secondary to problem #1.  Patient hydrated with IV fluids.  Patient dehydration felt likely secondary to increased ostomy output.  Patient placed on IV fluids and was euvolemic by day of discharge.  Outpatient follow-up.   4.  Bilateral DVT Likely secondary to immobility and underlying cancer.  Patient was placed on on full dose Lovenox which was adjusted to daily dosing per oncology, Dr. Benay Spice.  Outpatient follow-up with hematology/oncology.  5.  Right renal mass under surveillance  6.  Hypothyroidism TSH of 11.458.  Patient maintained on home regimen of Synthroid..  Outpatient follow-up with repeat thyroid function studies in about 4 to 6 weeks.  7.  Hypokalemia Likely  secondary to high ostomy output.  Magnesium at 2.3.    Potassium was repleted by day of discharge.  Outpatient follow-up.   8.  Adenocarcinoma of the colon with mets to the liver Patient undergoing chemotherapy, status post transverse colostomy and Port-A-Cath placement in 03/07/2019, started chemotherapy FOLFOX cycle 1 on 04/04/2019.  Patient was  followed by oncology throughout the hospitalization.  Patient will follow-up with oncology in the outpatient setting on 05/02/2019.  9.  Macrocytic anemia Previously on prednisone.  Prednisone resumed at 10 mg daily.  Oncology.  Outpatient follow-up with oncology.   Procedures:  Lower extremity Doppler 04/25/2019  Consultations:  Oncology: Dr.Sherrill  Discharge Exam: Vitals:   04/29/19 2034 04/30/19 0426  BP: 107/64 (!) 101/59  Pulse: 64 63  Resp: 18 18  Temp: 98.1 F (36.7 C) 98.1 F (36.7 C)  SpO2: 99% 99%    General: NAD Cardiovascular: RRR Respiratory: CTAB  Discharge Instructions   Discharge Instructions    Diet general   Complete by: As directed    Increase activity slowly   Complete by: As directed      Allergies as of 04/30/2019      Reactions   Lactose Intolerance (gi) Diarrhea   gas   Aspirin Itching   Morphine And Related Rash   "veins popped up and they gave me benadryl      Medication List    STOP taking these medications   polyethylene glycol 17 g packet Commonly known as: MIRALAX / GLYCOLAX     TAKE these medications   diphenoxylate-atropine 2.5-0.025 MG tablet Commonly known as: LOMOTIL Take 2 tablets by mouth 4 (four) times daily. What changed:   how much to take  when to take this  reasons to take this   enoxaparin 80 MG/0.8ML injection Commonly known as: LOVENOX Inject 0.8 mLs (80 mg total) into the skin daily for 30 days. Start taking on: May 01, 2019   Ensure Take 237 mLs by mouth 2 (two) times daily.   ferrous sulfate 325 (65 FE) MG EC tablet Take 325 mg by mouth daily.   levothyroxine 150 MCG tablet Commonly known as: SYNTHROID Take 1 tablet (150 mcg total) by mouth daily.   loperamide 2 MG capsule Commonly known as: IMODIUM Take 1 capsule (2 mg total) by mouth 4 (four) times daily. What changed:   how much to take  when to take this  reasons to take this   oxyCODONE-acetaminophen 5-325 MG  tablet Commonly known as: Percocet Take 1-2 tablets by mouth every 4 (four) hours as needed for severe pain.   potassium chloride SA 20 MEQ tablet Commonly known as: K-DUR Take 1 tablet (20 mEq total) by mouth daily.   predniSONE 10 MG tablet Commonly known as: DELTASONE Take 1 tablet (10 mg total) by mouth daily with breakfast. Start taking on: May 01, 2019 What changed:   medication strength  how much to take   prochlorperazine 10 MG tablet Commonly known as: COMPAZINE Take 1 tablet (10 mg total) by mouth every 6 (six) hours as needed for nausea or vomiting.   psyllium 95 % Pack Commonly known as: HYDROCIL/METAMUCIL Take 1 packet by mouth daily. Start taking on: May 01, 2019   temazepam 15 MG capsule Commonly known as: RESTORIL Take 1 capsule (15 mg total) by mouth at bedtime as needed for sleep.   traMADol 50 MG tablet Commonly known as: ULTRAM Take 1 tablet (50 mg total) by mouth every 6 (six) hours as needed.  What changed: reasons to take this   vitamin B-12 1000 MCG tablet Commonly known as: CYANOCOBALAMIN Take 1 tablet (1,000 mcg total) by mouth daily.      Allergies  Allergen Reactions  . Lactose Intolerance (Gi) Diarrhea    gas  . Aspirin Itching  . Morphine And Related Rash    "veins popped up and they gave me benadryl   Follow-up Information    Ladell Pier, MD Follow up on 05/02/2019.   Specialty: Oncology Why: f/u at 1145am. Contact information: [place] [address] [phone]            The results of significant diagnostics from this hospitalization (including imaging, microbiology, ancillary and laboratory) are listed below for reference.    Significant Diagnostic Studies: Vas [place] Lower Extremity Venous (dvt)  Result Date: 04/25/2019  Lower Venous Study Indications: Edema. Patient states has had left lower extremity for approximately 1 month.  Risk Factors: Cancer Metastatic colon cancer to liver.  Comparison Study: No prior study on file for comparison Performing Technologist: Sharion Dove RVS  Examination Guidelines: A complete evaluation includes B-mode imaging, spectral Doppler, color Doppler, and power Doppler as needed of all accessible portions of each vessel. Bilateral testing is considered an integral part of a complete examination. Limited examinations for reoccurring indications may be performed as noted.  +---------+---------------+---------+-----------+----------+-------+ RIGHT    CompressibilityPhasicitySpontaneityPropertiesSummary +---------+---------------+---------+-----------+----------+-------+ CFV      Full           Yes      Yes                          +---------+---------------+---------+-----------+----------+-------+ SFJ      Full                                                 +---------+---------------+---------+-----------+----------+-------+ FV Prox  Full                                                 +---------+---------------+---------+-----------+----------+-------+ FV Mid   Full                                                 +---------+---------------+---------+-----------+----------+-------+ FV DistalFull                                                 +---------+---------------+---------+-----------+----------+-------+ PFV      Full                                                 +---------+---------------+---------+-----------+----------+-------+ POP      Full           Yes      Yes                          +---------+---------------+---------+-----------+----------+-------+  PTV      Full                                                 +---------+---------------+---------+-----------+----------+-------+ PERO     None                                         Acute   +---------+---------------+---------+-----------+----------+-------+    +---------+---------------+---------+-----------+----------+-----------------+ LEFT     CompressibilityPhasicitySpontaneityPropertiesSummary           +---------+---------------+---------+-----------+----------+-----------------+ CFV      Partial        Yes      Yes                  Chronic           +---------+---------------+---------+-----------+----------+-----------------+ SFJ      Partial                                      Chronic           +---------+---------------+---------+-----------+----------+-----------------+ FV Prox  Full                                                           +---------+---------------+---------+-----------+----------+-----------------+ FV Mid   Full                                                           +---------+---------------+---------+-----------+----------+-----------------+ FV DistalPartial                                      Age Indeterminate +---------+---------------+---------+-----------+----------+-----------------+ PFV      Partial                                      Chronic           +---------+---------------+---------+-----------+----------+-----------------+ POP      None                                         Age Indeterminate +---------+---------------+---------+-----------+----------+-----------------+ PTV      None                                         Acute             +---------+---------------+---------+-----------+----------+-----------------+ PERO     None  Acute             +---------+---------------+---------+-----------+----------+-----------------+     Summary: Right: Findings consistent with acute deep vein thrombosis involving the right peroneal vein. Left: Findings consistent with acute deep vein thrombosis involving the left posterior tibial vein, and left peroneal vein. Findings consistent with age indeterminate deep vein  thrombosis involving the left distal femoral vein, and left popliteal vein. Findings consistent with chronic deep vein thrombosis involving the left common femoral vein, and left proximal profunda vein.  *See table(s) above for measurements and observations. Electronically signed by Monica Martinez MD on 04/25/2019 at 4:19:38 PM.    Final     Microbiology: Recent Results (from the past 240 hour(s))  SARS Coronavirus 2 (CEPHEID - Performed in [place] hospital lab), Hosp Order     Status: None   Collection Time: 04/24/19  9:59 AM   Specimen: Nasopharyngeal Swab  Result Value Ref Range Status   SARS Coronavirus 2 NEGATIVE NEGATIVE Final    Comment: (NOTE) If result is NEGATIVE SARS-CoV-2 target nucleic acids are NOT DETECTED. The SARS-CoV-2 RNA is generally detectable in upper and lower  respiratory specimens during the acute phase of infection. The lowest  concentration of SARS-CoV-2 viral copies this assay can detect is 250  copies / mL. A negative result does not preclude SARS-CoV-2 infection  and should not be used as the sole basis for treatment or other  patient management decisions.  A negative result may occur with  improper specimen collection / handling, submission of specimen other  than nasopharyngeal swab, presence of viral mutation(s) within the  areas targeted by this assay, and inadequate number of viral copies  (<250 copies / mL). A negative result must be combined with clinical  observations, patient history, and epidemiological information. If result is POSITIVE SARS-CoV-2 target nucleic acids are DETECTED. The SARS-CoV-2 RNA is generally detectable in upper and lower  respiratory specimens dur ing the acute phase of infection.  Positive  results are indicative of active infection with SARS-CoV-2.  Clinical  correlation with patient history and other diagnostic information is  necessary to determine patient infection status.  Positive results do  not rule out  bacterial infection or co-infection with other viruses. If result is PRESUMPTIVE POSTIVE SARS-CoV-2 nucleic acids MAY BE PRESENT.   A presumptive positive result was obtained on the submitted specimen  and confirmed on repeat testing.  While 2019 novel coronavirus  (SARS-CoV-2) nucleic acids may be present in the submitted sample  additional confirmatory testing may be necessary for epidemiological  and / or clinical management purposes  to differentiate between  SARS-CoV-2 and other Sarbecovirus currently known to infect humans.  If clinically indicated additional testing with an alternate test  methodology [phone]) is advised. The SARS-CoV-2 RNA is generally  detectable in upper and lower respiratory sp ecimens during the acute  phase of infection. The expected result is Negative. Fact Sheet for Patients:  StrictlyIdeas.no Fact Sheet for Healthcare Providers: BankingDealers.co.za This test is not yet approved or cleared by the [place] FDA and has been authorized for detection and/or diagnosis of SARS-CoV-2 by FDA under an Emergency Use Authorization (EUA).  This EUA will remain in effect (meaning this test can be used) for the duration of the COVID-19 declaration under Section 564(b)(1) of the Act, 21 U.S.C. section 360bbb-3(b)(1), unless the authorization is terminated or revoked sooner. Performed at [facility], [place] [address], [place], [place] [address]   Urine culture  Status: None   Collection Time: 04/24/19 11:51 AM   Specimen: Urine, Clean Catch  Result Value Ref Range Status   Specimen Description   Final    URINE, CLEAN CATCH Performed at [facility], [place] [address], [place], [place] [address]    Special Requests   Final    NONE Performed at [facility], [place] [address] [place], [place] [address]    Culture   Final    Multiple bacterial morphotypes  present, none predominant. Suggest appropriate recollection if clinically indicated.   Report Status 04/26/2019 FINAL  Final     Labs: Basic Metabolic Panel: Recent Labs  Lab 04/26/19 0345 04/27/19 0429 04/28/19 0510 04/29/19 0450 04/30/19 0411  NA 135 136 132* 130* 133*  K 3.3* 3.9 3.9 4.4 4.0  CL 109 113* 106 105 100  CO2 20* 19* 16* 18* 23  GLUCOSE 107* 92 83 76 94  BUN 28* 25* 23 25* 21  CREATININE 0.79 0.70 0.72 0.66 0.59  CALCIUM 7.4* 7.6* 7.2* 7.7* 7.6*  MG  --  1.8 2.3  --   --    Liver Function Tests: Recent Labs  Lab 04/24/19 0509 04/26/19 0345  AST 20 17  ALT 12 10  ALKPHOS 310* 283*  BILITOT 0.5 0.2*  PROT 5.7* 4.2*  ALBUMIN 2.6* 1.9*   No results for input(s): LIPASE, AMYLASE in the last 168 hours. No results for input(s): AMMONIA in the last 168 hours. CBC: Recent Labs  Lab 04/24/19 0509 04/24/19 1258 04/25/19 0405 04/26/19 0345 04/27/19 0429 04/30/19 0411  WBC 8.5 6.7 6.6 6.4 6.1 10.3  NEUTROABS 6.0  --   --   --  4.0  --   HGB 11.3* 9.6* 8.7* 8.5* 8.9* 8.7*  HCT 35.9* 31.4* 28.1* 27.4* 29.0* 28.3*  MCV 102.3* 103.3* 104.1* 105.0* 104.7* 101.8*  PLT 206 173 157 153 164 195   Cardiac Enzymes: Recent Labs  Lab 04/24/19 1258  TROPONINI <0.03   BNP: BNP (last 3 results) No results for input(s): BNP in the last 8760 hours.  ProBNP (last 3 results) No results for input(s): PROBNP in the last 8760 hours.  CBG: No results for input(s): GLUCAP in the last 168 hours.     Signed:  Irine Seal MD.  [facility] 04/30/2019, 1:08 PM

## 2019-04-30 NOTE — TOC Transition Note (Signed)
Transition of Care [facility]) - CM/SW Discharge Note   Patient Details  Name: Nichole Cross MRN: [id number] Date of Birth: [birth year]  [facility] of [facility] CM/SW Contact:  Joaquin Courts, RN Phone Number: 04/30/2019, 1:39 PM   Clinical Narrative:  CM confirmed [place] set-up with [facility] in preparation for d/c today.  [facility] rep, Corene Cornea, notified that [facility] social work will be needed at well as HHPT/OT        Barriers to Discharge: Continued Medical Work up   Patient Goals and CMS Choice Patient states their goals for this hospitalization and ongoing recovery are:: to get better CMS Medicare.gov Compare Post Acute Care list provided to:: Other (Comment Required)(Pt chose agency that she has used recently) Choice offered to / list presented to : Patient  Discharge Placement                       Discharge Plan and Services   Discharge Planning Services: CM Consult Post Acute Care Choice: Home Health                    HH Arranged: RN, PT [facility] Agency: [place] ([facility]) Date [facility] Agency Contacted: 04/27/19 Time [place]: [phone] Representative spoke with at [place]: Santiago Glad ([place])  Social Determinants of Health ([place]) Interventions     Readmission Risk Interventions Readmission Risk Prevention Plan 04/27/2019  Transportation Screening Complete  Medication Review Press photographer) Complete  HRI or [place] Complete  SW Recovery Care/Counseling Consult Complete  [place] Not Applicable  Some recent data might be hidden

## 2019-05-01 LAB — METHYLMALONIC ACID, SERUM: Methylmalonic Acid, Quantitative: 189 nmol/L (ref 0–378)

## 2019-05-02 ENCOUNTER — Inpatient Hospital Stay: Payer: Medicare Other

## 2019-05-02 ENCOUNTER — Inpatient Hospital Stay (HOSPITAL_BASED_OUTPATIENT_CLINIC_OR_DEPARTMENT_OTHER): Payer: Medicare Other | Admitting: Oncology

## 2019-05-02 ENCOUNTER — Telehealth: Payer: Self-pay | Admitting: *Deleted

## 2019-05-02 ENCOUNTER — Other Ambulatory Visit: Payer: Self-pay

## 2019-05-02 ENCOUNTER — Other Ambulatory Visit: Payer: Self-pay | Admitting: Oncology

## 2019-05-02 VITALS — BP 87/63 | HR 111 | Temp 98.3°F | Resp 18 | Ht 67.0 in | Wt 135.3 lb

## 2019-05-02 DIAGNOSIS — D701 Agranulocytosis secondary to cancer chemotherapy: Secondary | ICD-10-CM | POA: Diagnosis not present

## 2019-05-02 DIAGNOSIS — Z9049 Acquired absence of other specified parts of digestive tract: Secondary | ICD-10-CM | POA: Diagnosis not present

## 2019-05-02 DIAGNOSIS — Z886 Allergy status to analgesic agent status: Secondary | ICD-10-CM | POA: Diagnosis not present

## 2019-05-02 DIAGNOSIS — C187 Malignant neoplasm of sigmoid colon: Secondary | ICD-10-CM | POA: Diagnosis not present

## 2019-05-02 DIAGNOSIS — D509 Iron deficiency anemia, unspecified: Secondary | ICD-10-CM

## 2019-05-02 DIAGNOSIS — E872 Acidosis: Secondary | ICD-10-CM | POA: Diagnosis not present

## 2019-05-02 DIAGNOSIS — R001 Bradycardia, unspecified: Secondary | ICD-10-CM | POA: Diagnosis not present

## 2019-05-02 DIAGNOSIS — E039 Hypothyroidism, unspecified: Secondary | ICD-10-CM | POA: Diagnosis not present

## 2019-05-02 DIAGNOSIS — C189 Malignant neoplasm of colon, unspecified: Secondary | ICD-10-CM

## 2019-05-02 DIAGNOSIS — Z95828 Presence of other vascular implants and grafts: Secondary | ICD-10-CM

## 2019-05-02 DIAGNOSIS — R197 Diarrhea, unspecified: Secondary | ICD-10-CM | POA: Diagnosis not present

## 2019-05-02 DIAGNOSIS — M810 Age-related osteoporosis without current pathological fracture: Secondary | ICD-10-CM | POA: Diagnosis not present

## 2019-05-02 DIAGNOSIS — N3001 Acute cystitis with hematuria: Secondary | ICD-10-CM | POA: Diagnosis not present

## 2019-05-02 DIAGNOSIS — N3090 Cystitis, unspecified without hematuria: Secondary | ICD-10-CM | POA: Diagnosis not present

## 2019-05-02 DIAGNOSIS — N179 Acute kidney failure, unspecified: Secondary | ICD-10-CM | POA: Diagnosis not present

## 2019-05-02 DIAGNOSIS — I82452 Acute embolism and thrombosis of left peroneal vein: Secondary | ICD-10-CM | POA: Diagnosis not present

## 2019-05-02 DIAGNOSIS — R718 Other abnormality of red blood cells: Secondary | ICD-10-CM

## 2019-05-02 DIAGNOSIS — I10 Essential (primary) hypertension: Secondary | ICD-10-CM | POA: Diagnosis not present

## 2019-05-02 DIAGNOSIS — E860 Dehydration: Secondary | ICD-10-CM | POA: Diagnosis not present

## 2019-05-02 DIAGNOSIS — I82403 Acute embolism and thrombosis of unspecified deep veins of lower extremity, bilateral: Secondary | ICD-10-CM | POA: Diagnosis not present

## 2019-05-02 DIAGNOSIS — I82451 Acute embolism and thrombosis of right peroneal vein: Secondary | ICD-10-CM | POA: Diagnosis not present

## 2019-05-02 DIAGNOSIS — C787 Secondary malignant neoplasm of liver and intrahepatic bile duct: Secondary | ICD-10-CM | POA: Diagnosis not present

## 2019-05-02 DIAGNOSIS — Z885 Allergy status to narcotic agent status: Secondary | ICD-10-CM | POA: Diagnosis not present

## 2019-05-02 DIAGNOSIS — I959 Hypotension, unspecified: Secondary | ICD-10-CM | POA: Diagnosis not present

## 2019-05-02 DIAGNOSIS — G9341 Metabolic encephalopathy: Secondary | ICD-10-CM | POA: Diagnosis not present

## 2019-05-02 DIAGNOSIS — I82412 Acute embolism and thrombosis of left femoral vein: Secondary | ICD-10-CM | POA: Diagnosis not present

## 2019-05-02 DIAGNOSIS — R634 Abnormal weight loss: Secondary | ICD-10-CM

## 2019-05-02 DIAGNOSIS — Z7189 Other specified counseling: Secondary | ICD-10-CM | POA: Diagnosis not present

## 2019-05-02 DIAGNOSIS — R531 Weakness: Secondary | ICD-10-CM | POA: Diagnosis not present

## 2019-05-02 DIAGNOSIS — Z6824 Body mass index (BMI) 24.0-24.9, adult: Secondary | ICD-10-CM | POA: Diagnosis not present

## 2019-05-02 DIAGNOSIS — I82442 Acute embolism and thrombosis of left tibial vein: Secondary | ICD-10-CM | POA: Diagnosis not present

## 2019-05-02 DIAGNOSIS — K912 Postsurgical malabsorption, not elsewhere classified: Secondary | ICD-10-CM | POA: Diagnosis not present

## 2019-05-02 DIAGNOSIS — E162 Hypoglycemia, unspecified: Secondary | ICD-10-CM | POA: Diagnosis not present

## 2019-05-02 DIAGNOSIS — R4182 Altered mental status, unspecified: Secondary | ICD-10-CM | POA: Diagnosis not present

## 2019-05-02 DIAGNOSIS — E739 Lactose intolerance, unspecified: Secondary | ICD-10-CM | POA: Diagnosis not present

## 2019-05-02 DIAGNOSIS — R627 Adult failure to thrive: Secondary | ICD-10-CM | POA: Diagnosis not present

## 2019-05-02 DIAGNOSIS — N390 Urinary tract infection, site not specified: Secondary | ICD-10-CM | POA: Diagnosis not present

## 2019-05-02 DIAGNOSIS — Z515 Encounter for palliative care: Secondary | ICD-10-CM | POA: Diagnosis not present

## 2019-05-02 DIAGNOSIS — C641 Malignant neoplasm of right kidney, except renal pelvis: Secondary | ICD-10-CM | POA: Diagnosis not present

## 2019-05-02 DIAGNOSIS — R000 Tachycardia, unspecified: Secondary | ICD-10-CM | POA: Diagnosis not present

## 2019-05-02 DIAGNOSIS — K760 Fatty (change of) liver, not elsewhere classified: Secondary | ICD-10-CM | POA: Diagnosis not present

## 2019-05-02 LAB — CMP (CANCER CENTER ONLY)
ALT: 18 U/L (ref 0–44)
AST: 30 U/L (ref 15–41)
Albumin: 2 g/dL — ABNORMAL LOW (ref 3.5–5.0)
Alkaline Phosphatase: 529 U/L — ABNORMAL HIGH (ref 38–126)
Anion gap: 11 (ref 5–15)
BUN: 19 mg/dL (ref 8–23)
CO2: 24 mmol/L (ref 22–32)
Calcium: 7.8 mg/dL — ABNORMAL LOW (ref 8.9–10.3)
Chloride: 96 mmol/L — ABNORMAL LOW (ref 98–111)
Creatinine: 0.72 mg/dL (ref 0.44–1.00)
GFR, Est AFR Am: >60 mL/min (ref >60)
GFR, Estimated: >60 mL/min (ref >60)
Glucose, Bld: 94 mg/dL (ref 70–99)
Potassium: 5 mmol/L (ref 3.5–5.1)
Sodium: 131 mmol/L — ABNORMAL LOW (ref 135–145)
Total Bilirubin: 0.3 mg/dL (ref 0.3–1.2)
Total Protein: 5.3 g/dL — ABNORMAL LOW (ref 6.5–8.1)

## 2019-05-02 LAB — CBC WITH DIFFERENTIAL (CANCER CENTER ONLY)
Abs Immature Granulocytes: 0.42 10*3/uL — ABNORMAL HIGH (ref 0.00–0.07)
Basophils Absolute: 0 10*3/uL (ref 0.0–0.1)
Basophils Relative: 0 %
Eosinophils Absolute: 0 10*3/uL (ref 0.0–0.5)
Eosinophils Relative: 0 %
HCT: 34.5 % — ABNORMAL LOW (ref 36.0–46.0)
Hemoglobin: 10.9 g/dL — ABNORMAL LOW (ref 12.0–15.0)
Immature Granulocytes: 4 %
Lymphocytes Relative: 6 %
Lymphs Abs: 0.6 10*3/uL — ABNORMAL LOW (ref 0.7–4.0)
MCH: 31.4 pg (ref 26.0–34.0)
MCHC: 31.6 g/dL (ref 30.0–36.0)
MCV: 99.4 fL (ref 80.0–100.0)
Monocytes Absolute: 0.6 10*3/uL (ref 0.1–1.0)
Monocytes Relative: 6 %
Neutro Abs: 8.5 10*3/uL — ABNORMAL HIGH (ref 1.7–7.7)
Neutrophils Relative %: 84 %
Platelet Count: 380 10*3/uL (ref 150–400)
RBC: 3.47 MIL/uL — ABNORMAL LOW (ref 3.87–5.11)
RDW: 17.7 % — ABNORMAL HIGH (ref 11.5–15.5)
WBC Count: 10.2 10*3/uL (ref 4.0–10.5)
nRBC: 0 % (ref 0.0–0.2)

## 2019-05-02 LAB — MAGNESIUM: Magnesium: 1.7 mg/dL (ref 1.7–2.4)

## 2019-05-02 MED ORDER — OXYCODONE-ACETAMINOPHEN 5-325 MG PO TABS
ORAL_TABLET | ORAL | Status: AC
  Filled 2019-05-02: qty 2

## 2019-05-02 MED ORDER — SODIUM CHLORIDE 0.9% FLUSH
10.0000 mL | Freq: Once | INTRAVENOUS | Status: AC
  Administered 2019-05-02: 10 mL
  Filled 2019-05-02: qty 10

## 2019-05-02 MED ORDER — SODIUM CHLORIDE 0.9 % IV SOLN
INTRAVENOUS | Status: DC
  Administered 2019-05-02: 14:00:00 via INTRAVENOUS
  Filled 2019-05-02 (×2): qty 250

## 2019-05-02 MED ORDER — HEPARIN SOD (PORK) LOCK FLUSH 100 UNIT/ML IV SOLN
500.0000 [IU] | Freq: Once | INTRAVENOUS | Status: AC
  Administered 2019-05-02: 17:00:00 500 [IU]
  Filled 2019-05-02: qty 5

## 2019-05-02 MED ORDER — OXYCODONE-ACETAMINOPHEN 5-325 MG PO TABS
2.0000 | ORAL_TABLET | Freq: Once | ORAL | Status: AC
  Administered 2019-05-02: 2 via ORAL

## 2019-05-02 MED ORDER — OPIUM 10 MG/ML (1%) PO TINC: 0.6000 mL | Freq: Four times a day (QID) | ORAL | 0 refills | Status: DC

## 2019-05-02 NOTE — Telephone Encounter (Signed)
Pt was on TCM report admitted 04/24/19 for dizziness and near syncopal episode. Patient noted to have high ostomy output and noted to have significant orthostasis with blood pressures in the 80s as well as anorexia x1 week. Patient was placed on IV fluids due to dehydration, complaints of weakness and noted to have poor oral intake. Pt D/C 04/30/19, and will follow-up with Dr. Benay Spice, oncology as scheduled on 05/02/2019.Marland KitchenJohny Chess

## 2019-05-02 NOTE — Patient Instructions (Signed)
Treatment being held today due to hypotension, poor performance status and dehydration. Will repeat IV fluids on 05/04/19. Continue to work on hydration and protein intake.  For Diarrhea: Start tincture of opium 0.6 ml four times daily and supplement with Imodium as needed.                        Stop Lomotil when you begin taking the tincture of opium

## 2019-05-02 NOTE — Patient Instructions (Signed)
Dehydration, Adult  Dehydration is when there is not enough fluid or water in your body. This happens when you lose more fluids than you take in. Dehydration can range from mild to very bad. It should be treated right away to keep it from getting very bad. Symptoms of mild dehydration may include:  Thirst.  Dry lips.  Slightly dry mouth.  Dry, warm skin.  Dizziness. Symptoms of moderate dehydration may include:  Very dry mouth.  Muscle cramps.  Dark pee (urine). Pee may be the color of tea.  Your body making less pee.  Your eyes making fewer tears.  Heartbeat that is uneven or faster than normal (palpitations).  Headache.  Light-headedness, especially when you stand up from sitting.  Fainting (syncope). Symptoms of very bad dehydration may include:  Changes in skin, such as: ? Cold and clammy skin. ? Blotchy (mottled) or pale skin. ? Skin that does not quickly return to normal after being lightly pinched and let go (poor skin turgor).  Changes in body fluids, such as: ? Feeling very thirsty. ? Your eyes making fewer tears. ? Not sweating when body temperature is high, such as in hot weather. ? Your body making very little pee.  Changes in vital signs, such as: ? Weak pulse. ? Pulse that is more than 100 beats a minute when you are sitting still. ? Fast breathing. ? Low blood pressure.  Other changes, such as: ? Sunken eyes. ? Cold hands and feet. ? Confusion. ? Lack of energy (lethargy). ? Trouble waking up from sleep. ? Short-term weight loss. ? Unconsciousness. Follow these instructions at home:   If told by your doctor, drink an ORS: ? Make an ORS by using instructions on the package. ? Start by drinking small amounts, about  cup (120 mL) every 5-10 minutes. ? Slowly drink more until you have had the amount that your doctor said to have.  Drink enough clear fluid to keep your pee clear or pale yellow. If you were told to drink an ORS, finish the  ORS first, then start slowly drinking clear fluids. Drink fluids such as: ? Water. Do not drink only water by itself. Doing that can make the salt (sodium) level in your body get too low (hyponatremia). ? Ice chips. ? Fruit juice that you have added water to (diluted). ? Low-calorie sports drinks.  Avoid: ? Alcohol. ? Drinks that have a lot of sugar. These include high-calorie sports drinks, fruit juice that does not have water added, and soda. ? Caffeine. ? Foods that are greasy or have a lot of fat or sugar.  Take over-the-counter and prescription medicines only as told by your doctor.  Do not take salt tablets. Doing that can make the salt level in your body get too high (hypernatremia).  Eat foods that have minerals (electrolytes). Examples include bananas, oranges, potatoes, tomatoes, and spinach.  Keep all follow-up visits as told by your doctor. This is important. Contact a doctor if:  You have belly (abdominal) pain that: ? Gets worse. ? Stays in one area (localizes).  You have a rash.  You have a stiff neck.  You get angry or annoyed more easily than normal (irritability).  You are more sleepy than normal.  You have a harder time waking up than normal.  You feel: ? Weak. ? Dizzy. ? Very thirsty.  You have peed (urinated) only a small amount of very dark pee during 6-8 hours. Get help right away if:  You have   symptoms of very bad dehydration.  You cannot drink fluids without throwing up (vomiting).  Your symptoms get worse with treatment.  You have a fever.  You have a very bad headache.  You are throwing up or having watery poop (diarrhea) and it: ? Gets worse. ? Does not go away.  You have blood or something green (bile) in your throw-up.  You have blood in your poop (stool). This may cause poop to look black and tarry.  You have not peed in 6-8 hours.  You pass out (faint).  Your heart rate when you are sitting still is more than 100 beats a  minute.  You have trouble breathing. This information is not intended to replace advice given to you by your health care provider. Make sure you discuss any questions you have with your health care provider. Document Released: 08/30/2009 Document Revised: 05/23/2016 Document Reviewed: 12/28/2015 Elsevier Interactive Patient Education  2019 [employer]  

## 2019-05-02 NOTE — Progress Notes (Signed)
[place] OFFICE PROGRESS NOTE   Diagnosis: Colon cancer  INTERVAL HISTORY:   Nichole Cross was discharged from the hospital on 04/30/2019 after admission with presyncope on 04/28/2019.  She was dehydrated.  She continued to have liquid stool output while hospitalized.  This had improved partially with Imodium, Lomotil, and Metamucil.  Since discharge from the hospital she reports continued liquid stool output.  She emptied the ileostomy bag approximately 4 times daily.  Her oral intake has been limited according to her family.  She is able to ambulate short distances.  She stays in bed or in a chair most of the time.  She was diagnosed with bilateral lower extremity deep vein thromboses in the hospital.  She continues Lovenox.  Objective:  Vital signs in last 24 hours:  Blood pressure (!) 87/63, pulse (!) 111, temperature 98.3 F (36.8 C), temperature source Oral, resp. rate 18, height '5\' 7"'  (1.702 m), weight 135 lb 4.8 oz (61.4 kg), SpO2 99 %.    HEENT: Mild white coat over the tongue.  The tongue is dry.  No buccal thrush or ulcers Resp: Lungs clear bilaterally Cardio: Tachycardia, regular rhythm GI: No hepatomegaly, nontender, no mass, small volume of liquid stool in the ostomy bag Vascular: Pitting edema at the left greater than right lower leg  Portacath/PICC-without erythema  Lab Results:  Lab Results  Component Value Date   WBC 10.2 05/02/2019   HGB 10.9 (L) 05/02/2019   HCT 34.5 (L) 05/02/2019   MCV 99.4 05/02/2019   PLT 380 05/02/2019   NEUTROABS 8.5 (H) 05/02/2019    CMP  Lab Results  Component Value Date   NA 133 (L) 04/30/2019   K 4.0 04/30/2019   CL 100 04/30/2019   CO2 23 04/30/2019   GLUCOSE 94 04/30/2019   BUN 21 04/30/2019   CREATININE 0.59 04/30/2019   CALCIUM 7.6 (L) 04/30/2019   PROT 4.2 (L) 04/26/2019   ALBUMIN 1.9 (L) 04/26/2019   AST 17 04/26/2019   ALT 10 04/26/2019   ALKPHOS 283 (H) 04/26/2019   BILITOT 0.2 (L) 04/26/2019    GFRNONAA >60 04/30/2019   GFRAA >60 04/30/2019    Lab Results  Component Value Date   CEA1 178.58 (H) 03/28/2019    Lab Results  Component Value Date   INR 1.1 03/30/2019    Imaging:  No results found.  Medications: I have reviewed the patient's current medications.   Assessment/Plan: 1.Adenocarcinoma the distal sigmoid colon, poorly differentiated, stage II (T3 N0), status post a laparoscopic assisted sigmoid colectomy 08/09/2015  No loss of mismatch repair protein expression  Elevated preoperative CEA  Staging CT of the abdomen and pelvis 06/22/2015 with no evidence of distant metastatic disease  Mildly elevated CEA 02/23/2017And 04/17/2016  CTs chest, abdomen, and pelvis on 06/04/2016, 44m left upper lobe nodule-no comparison available, new hypodense lesion in the left liver, solid Right renal mass  MRI 06/10/2016-2 enhancing lesions in the liver consistent with metastatic disease, segment 2 and segment 5. Solid enhancing right renal lesion consistent with a renal cell carcinoma  Radiofrequency ablation of 2 liver lesions on 07/25/2016  Cycle 1 adjuvant Xeloda 08/18/2016  Cycle 2 Xeloda 09/08/2016  CEA improved 09/26/2016  Cycle 3 Xeloda held 09/29/2016 due to unexplained abdominal pain,referred for CT  CT abdomen/pelvis 09/30/2016 with a long segment of bowel wall thickening involving the distal ileum; lesion left hepatic lobe similar to slightly smaller following ablation; lesion posterior right hepatic lobe elongated favored to represent treatment tract.  Cycle 3 Xeloda 10/10/2016 (dose reduced due to drug induced enteritis following cycle 2)discontinued 10/15/2016 secondary to abdominal pain.  CT in while the emergency room with abdominal pain 10/15/2016 revealed descending colitis  Colonoscopy 10/30/2016 confirmed a mass at the: colo-colonicanastomosis  Cycle 4 Xeloda 11/03/2016  01/09/2017 status post a low anterior resection for removal of  locally recurrent tumor at the sigmoid anastomosis, resection margins negative, tumor invades pericolonic soft tissue, 12 benign lymph nodes;MSI stable. Foundation 1 testing- KRAS G13D, MSS, tumor mutation burden-3,BRAFwild-type  Cycle 5 Xeloda 02/12/2017  Cycle 6 Xeloda 03/12/2017, Xeloda dose reduced to 1000 mg twice daily on 03/18/2017  Cycle 7 Xeloda 04/09/2017  Cycle 8 Xeloda 05/12/2017  Restaging CTs 06/24/2017-no evidence of metastatic disease involving the chest. Post ablation changes in the liver. No new hepatic metastatic disease.  Restaging CTs 12/14/2017- stable liver ablation sites, indeterminate nodule adjacent to the descending colon, stable left renal mass, changes of early cirrhosis, enlarged porta hepatis node-likely reactive  MRI abdomen 04/22/2018- enlargement of 1.7 cm left paracolic gutter mass, stable 1 cm extrahepatic left liver capsule lesion, no evidence of local tumor recurrence at the ablation sites, no new liver lesions, stable right kidney mass  Colonoscopy 05/19/2018- mass in the rectum at the anastomotic site beginning at 3 cm from the anal verge, biopsy confirmed invasive adenocarcinoma  CT 06/07/2018-stable liver ablation sites, enlarging peritoneal lesion at the left pericolic gutter, stable nodule in the sigmoid mesocolon, stable prominent periportal lymph nodes, no evidence of metastatic disease to the chest  Radiation/Xeloda beginning 06/21/2018  Xeloda dose reduced to 1000 mg twice daily days of radiationbeginning8/16/2019 due to neutropenia  Radiation/Xeloda completed 08/02/2018  CTs 10/07/2018- new 6 mm right upper lobe nodule, stable ablation changes in the left and right liver, stable superior pole right renal mass, increased soft tissue thickening the presacral region, stable 12 mm porta hepatis node, increase in size of soft tissue nodule adjacent to the descending colon-2.1 x 2.2 cm  CTs 01/11/2019- anterior right upper lobe subpleural  nodule-smaller, no suspicious lung nodules, stable ablation defects in the liver, stable right kidney mass, wall thickening at the suture line in the lower pelvis with extension to the presacral region/vaginal fornix, slight enlargement of left pericolic gutter peritoneal implant  Sigmoidoscopy 01/27/2019- mass at 5 cm from the anal verge, biopsy confirmed adenocarcinoma  Laparoscopic transverse colostomy and Port-A-Cath placement 03/02/2019- tumor noted to be studding the descending colon  Cycle 1 FOLFOX 04/04/2019    2.History ofMicrocytic anemia-likely iron deficiency anemia secondary to colon cancer, persistent microcytic anemia;ferrous sulfate increased to twice daily 12/15/2017;hemoglobin in normal range 03/16/2018, persistent microcytosis  3.Tubular villous adenoma on the sigmoid colon resection specimen 08/09/2015  Multiple tubular adenomas removed on a colonoscopy 11/16/2015  4. Right renal mass on CT 06/04/2016-suspicious for renal cell carcinoma;stable on CT 06/24/2017;referred to urology  Stable on CT 01/11/2019  5. History ofneutropenia secondary to chemotherapy  6.Pain secondary to local recurrence of colon cancer  7.Abdominal pain. CT abdomen/pelvis 07/24/2018- inflammatory changes and mild dilatation of multiple small bowel loops in the lower abdomen and pelvis. Unchanged metastatic implant in the left paracolic gutter; unchanged periportal lymphadenopathy; hepatic steatosis; stable liver ablation site; unchanged 2.7 cm enhancing right renal mass.Resolved.  8.Severe macrocytic anemia 03/28/2019- vitamin B12 level at the low end of normal, hypersegmented neutrophils noted on peripheral blood smear, DAT positive, elevated LDH and bilirubin  Prednisone, 60 mg daily started 03/29/2019  1 unit of packed red blood cells 03/28/2019  Hemoglobin improved  03/30/2019, stable 03/31/2019  B12 initiated 03/30/2019  Hemoglobin improved 5/18/202  Prednisone  discontinued 05/02/2019  9.  Admission 04/24/2019 with presyncope 10.  Urinary tract infection 04/24/2019 11.  Bilateral lower extremity DVTs on Doppler 04/25/2019- right peroneal, left posterior tibial and left peroneal  Lovenox started   Disposition:  Nichole Cross has metastatic colon cancer.  She continues to have a poor performance status.  She appears dehydrated again today.  The dehydration is likely secondary to high output from the colostomy.  She will receive intravenous fluids today and again on 05/04/2019.  We added tincture of opium to the antidiarrhea regimen.  The hemoglobin has stabilized over the past several weeks.  Prednisone will be discontinued.  She continues vitamin B12 replacement.  She will return for an office visit on 05/06/2019.  She will be scheduled for an office visit and chemotherapy on 05/10/2019.  Nichole Coder, MD  05/02/2019  12:51 PM

## 2019-05-03 ENCOUNTER — Encounter (HOSPITAL_COMMUNITY): Payer: Self-pay

## 2019-05-03 ENCOUNTER — Emergency Department (HOSPITAL_COMMUNITY): Payer: Medicare Other

## 2019-05-03 ENCOUNTER — Inpatient Hospital Stay (HOSPITAL_COMMUNITY)
Admission: EM | Admit: 2019-05-03 | Discharge: 2019-05-10 | DRG: 391 | Disposition: A | Discharge status: Hospice | Payer: Medicare Other | Attending: Internal Medicine | Admitting: Internal Medicine

## 2019-05-03 ENCOUNTER — Other Ambulatory Visit: Payer: Self-pay

## 2019-05-03 DIAGNOSIS — G9341 Metabolic encephalopathy: Secondary | ICD-10-CM | POA: Diagnosis not present

## 2019-05-03 DIAGNOSIS — R601 Generalized edema: Secondary | ICD-10-CM | POA: Diagnosis present

## 2019-05-03 DIAGNOSIS — C787 Secondary malignant neoplasm of liver and intrahepatic bile duct: Secondary | ICD-10-CM | POA: Diagnosis present

## 2019-05-03 DIAGNOSIS — K760 Fatty (change of) liver, not elsewhere classified: Secondary | ICD-10-CM | POA: Diagnosis present

## 2019-05-03 DIAGNOSIS — Z9221 Personal history of antineoplastic chemotherapy: Secondary | ICD-10-CM

## 2019-05-03 DIAGNOSIS — C641 Malignant neoplasm of right kidney, except renal pelvis: Secondary | ICD-10-CM | POA: Diagnosis present

## 2019-05-03 DIAGNOSIS — E860 Dehydration: Secondary | ICD-10-CM

## 2019-05-03 DIAGNOSIS — Z90722 Acquired absence of ovaries, bilateral: Secondary | ICD-10-CM

## 2019-05-03 DIAGNOSIS — N390 Urinary tract infection, site not specified: Secondary | ICD-10-CM | POA: Diagnosis not present

## 2019-05-03 DIAGNOSIS — Z515 Encounter for palliative care: Secondary | ICD-10-CM | POA: Diagnosis not present

## 2019-05-03 DIAGNOSIS — Z79890 Hormone replacement therapy (postmenopausal): Secondary | ICD-10-CM

## 2019-05-03 DIAGNOSIS — C187 Malignant neoplasm of sigmoid colon: Secondary | ICD-10-CM | POA: Diagnosis not present

## 2019-05-03 DIAGNOSIS — Z9049 Acquired absence of other specified parts of digestive tract: Secondary | ICD-10-CM

## 2019-05-03 DIAGNOSIS — K912 Postsurgical malabsorption, not elsewhere classified: Principal | ICD-10-CM | POA: Diagnosis present

## 2019-05-03 DIAGNOSIS — E039 Hypothyroidism, unspecified: Secondary | ICD-10-CM | POA: Diagnosis present

## 2019-05-03 DIAGNOSIS — E739 Lactose intolerance, unspecified: Secondary | ICD-10-CM | POA: Diagnosis present

## 2019-05-03 DIAGNOSIS — D509 Iron deficiency anemia, unspecified: Secondary | ICD-10-CM | POA: Diagnosis present

## 2019-05-03 DIAGNOSIS — I82442 Acute embolism and thrombosis of left tibial vein: Secondary | ICD-10-CM | POA: Diagnosis present

## 2019-05-03 DIAGNOSIS — Z7952 Long term (current) use of systemic steroids: Secondary | ICD-10-CM

## 2019-05-03 DIAGNOSIS — Z886 Allergy status to analgesic agent status: Secondary | ICD-10-CM

## 2019-05-03 DIAGNOSIS — Z79891 Long term (current) use of opiate analgesic: Secondary | ICD-10-CM

## 2019-05-03 DIAGNOSIS — Z933 Colostomy status: Secondary | ICD-10-CM

## 2019-05-03 DIAGNOSIS — Z79899 Other long term (current) drug therapy: Secondary | ICD-10-CM

## 2019-05-03 DIAGNOSIS — Z885 Allergy status to narcotic agent status: Secondary | ICD-10-CM

## 2019-05-03 DIAGNOSIS — E162 Hypoglycemia, unspecified: Secondary | ICD-10-CM | POA: Diagnosis present

## 2019-05-03 DIAGNOSIS — R531 Weakness: Secondary | ICD-10-CM | POA: Diagnosis not present

## 2019-05-03 DIAGNOSIS — N3090 Cystitis, unspecified without hematuria: Secondary | ICD-10-CM | POA: Diagnosis present

## 2019-05-03 DIAGNOSIS — Z66 Do not resuscitate: Secondary | ICD-10-CM | POA: Diagnosis present

## 2019-05-03 DIAGNOSIS — I82403 Acute embolism and thrombosis of unspecified deep veins of lower extremity, bilateral: Secondary | ICD-10-CM | POA: Diagnosis present

## 2019-05-03 DIAGNOSIS — N179 Acute kidney failure, unspecified: Secondary | ICD-10-CM | POA: Diagnosis present

## 2019-05-03 DIAGNOSIS — N3001 Acute cystitis with hematuria: Secondary | ICD-10-CM

## 2019-05-03 DIAGNOSIS — Z800 Family history of malignant neoplasm of digestive organs: Secondary | ICD-10-CM

## 2019-05-03 DIAGNOSIS — H269 Unspecified cataract: Secondary | ICD-10-CM | POA: Diagnosis present

## 2019-05-03 DIAGNOSIS — Z803 Family history of malignant neoplasm of breast: Secondary | ICD-10-CM

## 2019-05-03 DIAGNOSIS — M810 Age-related osteoporosis without current pathological fracture: Secondary | ICD-10-CM | POA: Diagnosis present

## 2019-05-03 DIAGNOSIS — D589 Hereditary hemolytic anemia, unspecified: Secondary | ICD-10-CM | POA: Diagnosis present

## 2019-05-03 DIAGNOSIS — C189 Malignant neoplasm of colon, unspecified: Secondary | ICD-10-CM | POA: Diagnosis not present

## 2019-05-03 DIAGNOSIS — Z820 Family history of epilepsy and other diseases of the nervous system: Secondary | ICD-10-CM

## 2019-05-03 DIAGNOSIS — Z96652 Presence of left artificial knee joint: Secondary | ICD-10-CM | POA: Diagnosis present

## 2019-05-03 DIAGNOSIS — E872 Acidosis: Secondary | ICD-10-CM | POA: Diagnosis present

## 2019-05-03 DIAGNOSIS — I82451 Acute embolism and thrombosis of right peroneal vein: Secondary | ICD-10-CM | POA: Diagnosis present

## 2019-05-03 DIAGNOSIS — Z90710 Acquired absence of both cervix and uterus: Secondary | ICD-10-CM

## 2019-05-03 DIAGNOSIS — I82412 Acute embolism and thrombosis of left femoral vein: Secondary | ICD-10-CM | POA: Diagnosis present

## 2019-05-03 DIAGNOSIS — Z20828 Contact with and (suspected) exposure to other viral communicable diseases: Secondary | ICD-10-CM | POA: Diagnosis present

## 2019-05-03 DIAGNOSIS — Z6824 Body mass index (BMI) 24.0-24.9, adult: Secondary | ICD-10-CM

## 2019-05-03 LAB — COMPREHENSIVE METABOLIC PANEL
ALT: 18 U/L (ref 0–44)
AST: 25 U/L (ref 15–41)
Albumin: 2.1 g/dL — ABNORMAL LOW (ref 3.5–5.0)
Alkaline Phosphatase: 466 U/L — ABNORMAL HIGH (ref 38–126)
Anion gap: 13 (ref 5–15)
BUN: 24 mg/dL — ABNORMAL HIGH (ref 8–23)
CO2: 22 mmol/L (ref 22–32)
Calcium: 7.6 mg/dL — ABNORMAL LOW (ref 8.9–10.3)
Chloride: 98 mmol/L (ref 98–111)
Creatinine, Ser: 0.62 mg/dL (ref 0.44–1.00)
GFR calc Af Amer: >60 mL/min (ref >60)
GFR calc non Af Amer: >60 mL/min (ref >60)
Glucose, Bld: 76 mg/dL (ref 70–99)
Potassium: 4.3 mmol/L (ref 3.5–5.1)
Sodium: 133 mmol/L — ABNORMAL LOW (ref 135–145)
Total Bilirubin: 0.5 mg/dL (ref 0.3–1.2)
Total Protein: 4.8 g/dL — ABNORMAL LOW (ref 6.5–8.1)

## 2019-05-03 LAB — CBC WITH DIFFERENTIAL/PLATELET
Abs Immature Granulocytes: 0.34 10*3/uL — ABNORMAL HIGH (ref 0.00–0.07)
Basophils Absolute: 0 10*3/uL (ref 0.0–0.1)
Basophils Relative: 0 %
Eosinophils Absolute: 0 10*3/uL (ref 0.0–0.5)
Eosinophils Relative: 0 %
HCT: 32.1 % — ABNORMAL LOW (ref 36.0–46.0)
Hemoglobin: 9.7 g/dL — ABNORMAL LOW (ref 12.0–15.0)
Immature Granulocytes: 4 %
Lymphocytes Relative: 6 %
Lymphs Abs: 0.5 10*3/uL — ABNORMAL LOW (ref 0.7–4.0)
MCH: 30.6 pg (ref 26.0–34.0)
MCHC: 30.2 g/dL (ref 30.0–36.0)
MCV: 101.3 fL — ABNORMAL HIGH (ref 80.0–100.0)
Monocytes Absolute: 0.6 10*3/uL (ref 0.1–1.0)
Monocytes Relative: 8 %
Neutro Abs: 6.4 10*3/uL (ref 1.7–7.7)
Neutrophils Relative %: 82 %
Platelets: 324 10*3/uL (ref 150–400)
RBC: 3.17 MIL/uL — ABNORMAL LOW (ref 3.87–5.11)
RDW: 17.6 % — ABNORMAL HIGH (ref 11.5–15.5)
WBC: 7.8 10*3/uL (ref 4.0–10.5)
nRBC: 0 % (ref 0.0–0.2)

## 2019-05-03 LAB — URINALYSIS, ROUTINE W REFLEX MICROSCOPIC
Bilirubin Urine: NEGATIVE
Glucose, UA: NEGATIVE mg/dL
Ketones, ur: NEGATIVE mg/dL
Nitrite: NEGATIVE
Protein, ur: 30 mg/dL — AB
Specific Gravity, Urine: 1.008 (ref 1.005–1.030)
WBC, UA: >50 WBC/hpf — ABNORMAL HIGH (ref 0–5)
pH: 8 (ref 5.0–8.0)

## 2019-05-03 LAB — MAGNESIUM
Magnesium: 1.9 mg/dL (ref 1.7–2.4)
Magnesium: 1.9 mg/dL (ref 1.7–2.4)

## 2019-05-03 LAB — AMMONIA: Ammonia: 68 umol/L — ABNORMAL HIGH (ref 9–35)

## 2019-05-03 LAB — SARS CORONAVIRUS 2 BY RT PCR (HOSPITAL ORDER, PERFORMED IN ~~LOC~~ HOSPITAL LAB): SARS Coronavirus 2: NEGATIVE

## 2019-05-03 LAB — LIPASE, BLOOD: Lipase: 18 U/L (ref 11–51)

## 2019-05-03 MED ORDER — TEMAZEPAM 15 MG PO CAPS
15.0000 mg | ORAL_CAPSULE | Freq: Every day | ORAL | Status: DC
  Administered 2019-05-03 – 2019-05-07 (×5): 15 mg via ORAL
  Filled 2019-05-03 (×5): qty 1

## 2019-05-03 MED ORDER — SODIUM CHLORIDE 0.9 % IV BOLUS
1000.0000 mL | Freq: Once | INTRAVENOUS | Status: AC
  Administered 2019-05-03: 1000 mL via INTRAVENOUS

## 2019-05-03 MED ORDER — OXYCODONE-ACETAMINOPHEN 5-325 MG PO TABS: 1.0000 | ORAL_TABLET | ORAL | Status: DC | PRN

## 2019-05-03 MED ORDER — TRAMADOL HCL 50 MG PO TABS: 50.0000 mg | ORAL_TABLET | Freq: Four times a day (QID) | ORAL | Status: DC | PRN

## 2019-05-03 MED ORDER — VITAMIN B-12 1000 MCG PO TABS
1000.0000 ug | ORAL_TABLET | Freq: Every day | ORAL | Status: DC
  Administered 2019-05-03 – 2019-05-07 (×5): 1000 ug via ORAL
  Filled 2019-05-03 (×5): qty 1

## 2019-05-03 MED ORDER — SODIUM CHLORIDE 0.9 % IV SOLN
INTRAVENOUS | Status: DC
  Administered 2019-05-03 – 2019-05-04 (×2): via INTRAVENOUS

## 2019-05-03 MED ORDER — ENSURE PO LIQD: 237.0000 mL | Freq: Two times a day (BID) | ORAL | Status: DC

## 2019-05-03 MED ORDER — LOPERAMIDE HCL 2 MG PO CAPS
2.0000 mg | ORAL_CAPSULE | Freq: Four times a day (QID) | ORAL | Status: DC
  Administered 2019-05-03 – 2019-05-07 (×17): 2 mg via ORAL
  Filled 2019-05-03 (×17): qty 1

## 2019-05-03 MED ORDER — POTASSIUM CHLORIDE CRYS ER 20 MEQ PO TBCR
20.0000 meq | EXTENDED_RELEASE_TABLET | Freq: Every day | ORAL | Status: DC
  Administered 2019-05-03 – 2019-05-07 (×5): 20 meq via ORAL
  Filled 2019-05-03 (×5): qty 1

## 2019-05-03 MED ORDER — ENSURE ENLIVE PO LIQD: 237.0000 mL | Freq: Two times a day (BID) | ORAL | Status: DC

## 2019-05-03 MED ORDER — ENOXAPARIN SODIUM 80 MG/0.8ML ~~LOC~~ SOLN
90.0000 mg | SUBCUTANEOUS | Status: DC
  Administered 2019-05-03 – 2019-05-09 (×7): 90 mg via SUBCUTANEOUS
  Filled 2019-05-03 (×7): qty 1.6

## 2019-05-03 MED ORDER — ALBUTEROL SULFATE (2.5 MG/3ML) 0.083% IN NEBU: 2.5000 mg | INHALATION_SOLUTION | RESPIRATORY_TRACT | Status: DC | PRN

## 2019-05-03 MED ORDER — LEVOTHYROXINE SODIUM 75 MCG PO TABS
150.0000 ug | ORAL_TABLET | Freq: Every day | ORAL | Status: DC
  Administered 2019-05-04 – 2019-05-07 (×4): 150 ug via ORAL
  Filled 2019-05-03 (×5): qty 2

## 2019-05-03 MED ORDER — PREDNISONE 5 MG PO TABS
10.0000 mg | ORAL_TABLET | Freq: Every day | ORAL | Status: DC
  Administered 2019-05-04 – 2019-05-05 (×2): 10 mg via ORAL
  Filled 2019-05-03 (×2): qty 2

## 2019-05-03 MED ORDER — FERROUS SULFATE 325 (65 FE) MG PO TABS
325.0000 mg | ORAL_TABLET | Freq: Every day | ORAL | Status: DC
  Administered 2019-05-03 – 2019-05-07 (×5): 325 mg via ORAL
  Filled 2019-05-03 (×5): qty 1

## 2019-05-03 MED ORDER — ONDANSETRON HCL 4 MG PO TABS: 4.0000 mg | ORAL_TABLET | Freq: Four times a day (QID) | ORAL | Status: DC | PRN

## 2019-05-03 MED ORDER — IPRATROPIUM BROMIDE 0.02 % IN SOLN: 0.5000 mg | RESPIRATORY_TRACT | Status: DC | PRN

## 2019-05-03 MED ORDER — ONDANSETRON HCL 4 MG/2ML IJ SOLN
4.0000 mg | Freq: Four times a day (QID) | INTRAMUSCULAR | Status: DC | PRN
  Administered 2019-05-07: 4 mg via INTRAVENOUS
  Filled 2019-05-03: qty 2

## 2019-05-03 MED ORDER — ACETAMINOPHEN 325 MG PO TABS: 650.0000 mg | ORAL_TABLET | Freq: Four times a day (QID) | ORAL | Status: DC | PRN

## 2019-05-03 MED ORDER — CHOLESTYRAMINE LIGHT 4 G PO PACK
4.0000 g | PACK | Freq: Two times a day (BID) | ORAL | Status: DC
  Administered 2019-05-03 – 2019-05-07 (×7): 4 g via ORAL
  Filled 2019-05-03 (×14): qty 1

## 2019-05-03 MED ORDER — ENOXAPARIN SODIUM 80 MG/0.8ML ~~LOC~~ SOLN: 80.0000 mg | SUBCUTANEOUS | Status: DC

## 2019-05-03 MED ORDER — DIPHENOXYLATE-ATROPINE 2.5-0.025 MG PO TABS
2.0000 | ORAL_TABLET | Freq: Four times a day (QID) | ORAL | Status: DC
  Administered 2019-05-03 – 2019-05-07 (×17): 2 via ORAL
  Filled 2019-05-03 (×17): qty 2

## 2019-05-03 MED ORDER — SODIUM CHLORIDE 0.9 % IV SOLN
1.0000 g | INTRAVENOUS | Status: DC
  Administered 2019-05-04: 1 g via INTRAVENOUS
  Filled 2019-05-03: qty 1

## 2019-05-03 MED ORDER — OCTREOTIDE ACETATE 50 MCG/ML IJ SOLN
50.0000 ug | Freq: Two times a day (BID) | INTRAMUSCULAR | Status: DC
  Administered 2019-05-03 – 2019-05-05 (×4): 50 ug via SUBCUTANEOUS
  Filled 2019-05-03 (×4): qty 1

## 2019-05-03 MED ORDER — ACETAMINOPHEN 650 MG RE SUPP: 650.0000 mg | Freq: Four times a day (QID) | RECTAL | Status: DC | PRN

## 2019-05-03 MED ORDER — SODIUM CHLORIDE 0.9 % IV SOLN
1.0000 g | Freq: Once | INTRAVENOUS | Status: AC
  Administered 2019-05-03: 1 g via INTRAVENOUS
  Filled 2019-05-03: qty 10

## 2019-05-03 NOTE — ED Notes (Signed)
Pt's daughter Renee Ramus would like an update [phone] [name] states urine is as dark as stool.

## 2019-05-03 NOTE — ED Notes (Signed)
Pt's urine is dark, cloudy , and brown in color.

## 2019-05-03 NOTE — ED Triage Notes (Signed)
Pt c/o of weakness.  Pt has been unable to eat for 24 hours, and has only been able to sip some water.  Pt hx of colon cancer.  Pt states her urine is black, as well as the stool in her colostomy bag.

## 2019-05-03 NOTE — ED Notes (Signed)
Pt placed on purewick 

## 2019-05-03 NOTE — H&P (Signed)
History and Physical    Nichole Cross YPP:[id number] DOB: 04/29/1941 DOA: 05/03/2019  PCP: [name], MD  Patient coming from: Home  I have personally briefly reviewed patient's old medical records in [place]  Chief Complaint: Weakness/failure to thrive  HPI: Nichole Cross is a 78 y.o. female with medical history significant of adenocarcinoma distal sigmoid colon poorly differentiated stage II status post laparoscopic-assisted sigmoid colectomy 08/09/2015 with ongoing chemotherapy, of the anal verge status post laparoscopic transverse colostomy and Port-A-Cath placement 03/02/2019 tumor noted to be starting the descending colon status post cycle 1 FOLFOX 04/04/2019, history of right renal mass per CT 06/04/2016 suspicious for renal cell carcinoma stable, chronic pain secondary to local recurrence of colon cancer, microcytic anemia, history of volume depletion secondary to increased ostomy output, recently hospitalized 04/23/2021 04/30/2019 for bilateral lower extremity DVTs, near syncopal episode secondary to increased ostomy output who was discharged on Imodium, Metamucil, Lomotil.  Patient followed up with oncology on 05/02/2019 given IV fluids and a tincture of opium to her antidiarrheal regimen.  Patient presented to the ED with significant weakness to the point she was unable to stand, continued increased ostomy output.  It was noted per ED physician's note that family stated that patient's urine was black and patient noted to be confused.  Patient however states urine was not black however was dark brown which was confirmed by RN in the ED.  Patient denies any confusion, no fever, no chills, no nausea, no vomiting, no abdominal pain, no hematemesis, no hematochezia, no melena, no syncope, no chest pain, no shortness of breath, no headache, no focal neurological deficits, no visual changes.  ED Course: Patient seen in the ED urinalysis concerning for UTI with turbid appearance, large  leukocytes, nitrite negative, many bacteria, greater than 50 WBCs.  Comprehensive metabolic profile with a sodium of 133 BUN of 24 creatinine of 0.62, alk phosphatase of 466, albumin of 2.1 otherwise was within normal limits.  Lipase at 18.  Ammonia at 68.  CBC with a hemoglobin of 9.7 otherwise was within normal limits.  SARS coronavirus 2 ordered pending.  Patient given a dose of IV Rocephin and 2 L normal saline bolus.  Hospitalist were called to admit the patient.  Review of Systems: As per HPI otherwise 10 point review of systems negative.   Past Medical History:  Diagnosis Date  . Anemia   . Arthritis    knees  . Blood dyscrasia 2016   microcytic anemia, likely iron deficiency anemia secondary to colon cancer  . Cataracts, bilateral   . Colon cancer ([place]) 06/2015   tubular villous adenoma on sigmoid colon specimen (08/09/15), myltiple tubular adenomas removed on a colonoscopy  11/16/15  . Complication of anesthesia   . Fatty liver   . Headache(784.0)    migraines  . Hypothyroidism   . Liver metastasis ([place])   . Metastasis to kidney ([place])   . Osteoporosis   . PONV (postoperative nausea and vomiting)    NAUSEA  . Renal carcinoma ([place]) 06/2016   ? primary  . Secondary liver cancer ([place]) 06/2016   ? metastasis from colon    Past Surgical History:  Procedure Laterality Date  . ABDOMINAL HYSTERECTOMY  1993   with bilateral BSO  . abdominal tumor  1993  . APPENDECTOMY    . COLON RESECTION N/A 03/02/2019   Procedure: LAPAROSCOPY COLOSTOMY CREATION, SMALL BOWEL RESECTION, BLADDER REPAIR;  Surgeon: Leighton Ruff, MD;  Location: [facility];  Service: General;  Laterality: N/A;  . COLON SURGERY     partial colectomy  . IR GENERIC HISTORICAL  07/01/2016   IR RADIOLOGIST EVAL & MGMT 07/01/2016 Greggory Keen, MD [facility] INTERV RAD  . IR GENERIC HISTORICAL  08/14/2016   IR RADIOLOGIST EVAL & MGMT 08/14/2016 Jacqulynn Cadet, MD [facility] INTERV RAD  . IR RADIOLOGIST EVAL & MGMT  12/24/2017  . IR  RADIOLOGIST EVAL & MGMT  04/22/2018  . LAPAROSCOPIC PARTIAL COLECTOMY N/A 08/09/2015   Procedure: LAPAROSCOPIC PARTIAL COLECTOMY;  Surgeon: Jackolyn Confer, MD;  Location: [facility];  Service: General;  Laterality: N/A;  . PARTIAL KNEE ARTHROPLASTY  09/09/2012   Procedure: UNICOMPARTMENTAL KNEE;  Surgeon: Mauri Pole, MD;  Location: [facility];  Service: Orthopedics;  Laterality: Left;  . PORTACATH PLACEMENT Right 03/02/2019   Procedure: INSERTION PORT-A-CATH WITH [place];  Surgeon: Leighton Ruff, MD;  Location: [facility];  Service: General;  Laterality: Right;  . [place] LIVER TUMOR  2018  . XI ROBOTIC ASSISTED LOWER ANTERIOR RESECTION N/A 01/09/2017   Procedure: XI ROBOTIC ASSISTED LOWER ANTERIOR RESECTION;  Surgeon: Leighton Ruff, MD;  Location: [facility];  Service: General;  Laterality: N/A;     reports that she has never smoked. She has never used smokeless tobacco. She reports that she does not drink alcohol or use drugs.  Allergies  Allergen Reactions  . Lactose Intolerance (Gi) Diarrhea    gas  . Aspirin Itching  . Morphine And Related Rash    "veins popped up and they gave me benadryl    Family History  Problem Relation Age of Onset  . Pancreatic cancer Mother   . Parkinson's disease Mother   . Breast cancer Sister   . Parkinson's disease Brother   . Parkinson's disease Brother   . Colon cancer Neg Hx   . Esophageal cancer Neg Hx   . Liver cancer Neg Hx   . Rectal cancer Neg Hx   . Stomach cancer Neg Hx    Unacceptable: Noncontributory, unremarkable, or negative. Acceptable: Family history reviewed and not pertinent (If you reviewed it)  Prior to Admission medications   Medication Sig Start Date End Date Taking? Authorizing Provider  diphenoxylate-atropine (LOMOTIL) 2.5-0.025 MG tablet Take 2 tablets by mouth 4 (four) times daily. Patient taking differently: Take 1 tablet by mouth 4 (four) times daily.  04/30/19  Yes Eugenie Filler, MD  enoxaparin (LOVENOX) 80  MG/0.8ML injection Inject 0.8 mLs (80 mg total) into the skin daily for 30 days. 05/01/19 05/31/19 Yes Eugenie Filler, MD  Ensure (ENSURE) Take 237 mLs by mouth 2 (two) times daily.   Yes [provider]  ferrous sulfate 325 (65 FE) MG EC tablet Take 325 mg by mouth daily.    Yes [provider]  levothyroxine (SYNTHROID, LEVOTHROID) 150 MCG tablet Take 1 tablet (150 mcg total) by mouth daily. 02/25/18  Yes Burns, Claudina Lick, MD  loperamide (IMODIUM) 2 MG capsule Take 1 capsule (2 mg total) by mouth 4 (four) times daily. 04/30/19  Yes Eugenie Filler, MD  Opium 10 MG/ML (1%) TINC Take 0.6 mLs (6 mg total) by mouth 4 (four) times daily. 05/02/19  Yes Ladell Pier, MD  oxyCODONE-acetaminophen (PERCOCET) 5-325 MG tablet Take 1-2 tablets by mouth every 4 (four) hours as needed for severe pain. 04/19/19  Yes Owens Shark, NP  potassium chloride SA (K-DUR) 20 MEQ tablet Take 1 tablet (20 mEq total) by mouth daily. 04/04/19  Yes Owens Shark, NP  predniSONE (  DELTASONE) 10 MG tablet Take 1 tablet (10 mg total) by mouth daily with breakfast. 05/01/19  Yes Eugenie Filler, MD  prochlorperazine (COMPAZINE) 10 MG tablet Take 1 tablet (10 mg total) by mouth every 6 (six) hours as needed for nausea or vomiting. 03/30/19  Yes Ladell Pier, MD  temazepam (RESTORIL) 15 MG capsule Take 1 capsule (15 mg total) by mouth at bedtime as needed for sleep. Patient taking differently: Take 15 mg by mouth at bedtime.  04/18/19  Yes Ladell Pier, MD  traMADol (ULTRAM) 50 MG tablet Take 1 tablet (50 mg total) by mouth every 6 (six) hours as needed. Patient taking differently: Take 50 mg by mouth every 6 (six) hours as needed (knee pain).  12/14/18  Yes Owens Shark, NP  vitamin B-12 (CYANOCOBALAMIN) 1000 MCG tablet Take 1 tablet (1,000 mcg total) by mouth daily. 03/30/19  Yes Ladell Pier, MD    Physical Exam: Vitals:   05/03/19 1430 05/03/19 1500 05/03/19 1550 05/03/19 1630  BP: 99/67 97/65  111/68 107/71  Pulse:   79 73  Resp: _0 Temp:      TempSrc:      SpO2: 97% 96% 98% 98%  Weight:      Height:        Constitutional: NAD, calm, comfortable Vitals:   05/03/19 1430 05/03/19 1500 05/03/19 1550 05/03/19 1630  BP: 99/67 97/65 111/68 107/71  Pulse:   79 73  Resp: _1 Temp:      TempSrc:      SpO2: 97% 96% 98% 98%  Weight:      Height:       Eyes: PERRL, lids and conjunctivae normal ENMT: Mucous membranes are dry. Posterior pharynx clear of any exudate or lesions.Normal dentition.  Neck: normal, supple, no masses, no thyromegaly Respiratory: clear to auscultation bilaterally, no wheezing, no crackles. Normal respiratory effort. No accessory muscle use.  Cardiovascular: Regular rate and rhythm, no murmurs / rubs / gallops. No extremity edema. 2+ pedal pulses. No carotid bruits.  Abdomen: no tenderness, no masses palpated. No hepatosplenomegaly. Bowel sounds positive.  Ostomy bag in place with significant amount of loose watery greenish stool. Musculoskeletal: no clubbing / cyanosis. No joint deformity upper and lower extremities.  2-3/5 bilateral lower extremity strength. Skin: no rashes, lesions, ulcers. No induration Neurologic: CN 2-12 grossly intact. Sensation intact, DTR normal. Strength 5/5 in all 4.  Psychiatric: Normal judgment and insight. Alert and oriented x 3. Normal mood.  Labs on Admission: I have personally reviewed following labs and imaging studies  CBC: Recent Labs  Lab 04/27/19 0429 04/30/19 0411 05/02/19 1158 05/03/19 1034  WBC 6.1 10.3 10.2 7.8  NEUTROABS 4.0  --  8.5* 6.4  HGB 8.9* 8.7* 10.9* 9.7*  HCT 29.0* 28.3* 34.5* 32.1*  MCV 104.7* 101.8* 99.4 101.3*  PLT 164 195 380 967   Basic Metabolic Panel: Recent Labs  Lab 04/27/19 0429 04/28/19 0510 04/29/19 0450 04/30/19 0411 05/02/19 1158 05/03/19 1034  NA 136 132* 130* 133* 131* 133*  K 3.9 3.9 4.4 4.0 5.0 4.3  CL 113* 106 105 100 96* 98  CO2 19* 16* 18* _2 GLUCOSE 92 83 76 94 94 76  BUN 25* 23 25* 21 19 24*  CREATININE 0.70 0.72 0.66 0.59 0.72 0.62  CALCIUM 7.6* 7.2* 7.7* 7.6* 7.8* 7.6*  MG 1.8 2.3  --   --  1.7 1.9   GFR: Estimated  Creatinine Clearance: 58.3 mL/min (by C-G formula based on SCr of 0.62 mg/dL). Liver Function Tests: Recent Labs  Lab 05/02/19 1158 05/03/19 1034  AST 30 25  ALT 18 18  ALKPHOS 529* 466*  BILITOT 0.3 0.5  PROT 5.3* 4.8*  ALBUMIN 2.0* 2.1*   Recent Labs  Lab 05/03/19 1034  LIPASE 18   Recent Labs  Lab 05/03/19 1032  AMMONIA 68*   Coagulation Profile: No results for input(s): INR, PROTIME in the last 168 hours. Cardiac Enzymes: No results for input(s): CKTOTAL, CKMB, CKMBINDEX, TROPONINI in the last 168 hours. BNP (last 3 results) No results for input(s): PROBNP in the last 8760 hours. HbA1C: No results for input(s): HGBA1C in the last 72 hours. CBG: No results for input(s): GLUCAP in the last 168 hours. Lipid Profile: No results for input(s): CHOL, HDL, LDLCALC, TRIG, CHOLHDL, LDLDIRECT in the last 72 hours. Thyroid Function Tests: No results for input(s): TSH, T4TOTAL, FREET4, T3FREE, THYROIDAB in the last 72 hours. Anemia Panel: No results for input(s): VITAMINB12, FOLATE, FERRITIN, TIBC, IRON, RETICCTPCT in the last 72 hours. Urine analysis:    Component Value Date/Time   COLORURINE YELLOW 05/03/2019 1425   APPEARANCEUR TURBID (A) 05/03/2019 1425   LABSPEC 1.008 05/03/2019 1425   PHURINE 8.0 05/03/2019 1425   GLUCOSEU NEGATIVE 05/03/2019 1425   HGBUR LARGE (A) 05/03/2019 1425   HGBUR negative 04/13/2009 0902   BILIRUBINUR NEGATIVE 05/03/2019 1425   KETONESUR NEGATIVE 05/03/2019 1425   PROTEINUR 30 (A) 05/03/2019 1425   UROBILINOGEN 1.0 09/08/2012 1055   NITRITE NEGATIVE 05/03/2019 1425   LEUKOCYTESUR LARGE (A) 05/03/2019 1425    Radiological Exams on Admission: Ct Head Wo Contrast  Result Date: 05/03/2019 CLINICAL DATA:  Weakness. The patient has been unable to  eat for the past 24 hours. EXAM: CT HEAD WITHOUT CONTRAST TECHNIQUE: Contiguous axial images were obtained from the base of the skull through the vertex without intravenous contrast. COMPARISON:  Head CT scan 12/28/2016. FINDINGS: Brain: No evidence of acute infarction, hemorrhage, hydrocephalus, extra-axial collection or mass lesion/mass effect. Cortical atrophy noted. Vascular: No hyperdense vessel or unexpected calcification. Skull: Intact.  No focal lesion. Sinuses/Orbits: Negative. Other: None. IMPRESSION: No acute abnormality. Cortical atrophy. Electronically Signed   By: Inge Rise M.D.   On: 05/03/2019 11:59    EKG: Not done.  Assessment/Plan Principal Problem:   Weakness Active Problems:   Leg DVT (deep venous thromboembolism), acute, bilateral (HCC)   Hypothyroidism   Cystitis   Sigmoid colon cancer s/p lap assisted sigmoid colectomy 08/09/15   Anemia, iron deficiency   Adenocarcinoma of colon metastatic to liver (HCC)   Dehydration   Acute lower UTI   Severe dehydration   1 weakness likely secondary to increased ostomy output Patient presented with generalized weakness to the point she is unable to ambulate.  Patient with significant weakness on examination.  On exam unable to lift both lower extremities off the gurney.  Patient states increased ostomy output.  Patient states has been eating about less than 50% of her diet.  Check a EKG.  Check blood cultures x2.  Will place on IV fluids.  Continue Imodium, Metamucil, Lomotil.  Placed on Questran and octreotide to help decrease ostomy output.  PT/OT.  Will likely require SNF placement on discharge.  2.  Increased ostomy output Likely secondary to short gut syndrome.  Patient with chronic increased ostomy output however patient with difficulty maintaining hydration leading to generalized weakness.  Continue Lomotil, Imodium, Metamucil.  Patient was  placed on a tincture of opium per oncology however per ED physician family had  stated that patient had some confusion at home and as such we will hold off on tincture of opium at this time.  Will place on Questran and octreotide subcutaneously.  Consult with general surgery for further evaluation and recommendations.  3.  UTI Urine cultures pending.  Place on IV Rocephin.  4.  Hypothyroidism TSH from 04/24/2019 was 11.458.  Continue home dose Synthroid.  Will need repeat thyroid function studies done in about 4 to 6 weeks.  5.  Acute bilateral lower extremity DVT Noted during last hospitalization.  Continue full dose Lovenox.  6.  Severe dehydration IV fluids.  7.  Right renal mass Under surveillance.  8.  Adenocarcinoma of the colon with mets to the liver/ Patient undergoing chemotherapy status post transverse colostomy and Port-A-Cath placement 03/07/2019, started chemotherapy FOLFOX cycle 1 on 04/04/2019.  Oncology notified of patient's admission via epic.  Will likely need outpatient follow-up with oncology.  9.  Microcytic anemia Previously on prednisone.  Placed back on prednisone 10 mg daily.  Outpatient follow-up with oncology.  DVT prophylaxis: Lovenox Code Status: DNR Family Communication: Updated patient. Disposition Plan: To be determined.  SNF versus home with home health. Consults called: General surgery. Admission status: Place in observation.   Irine Seal MD [facility]  If 7PM-7AM, please contact night-coverage [email or web address]  05/03/2019, 5:42 PM

## 2019-05-03 NOTE — Consult Note (Signed)
[facility] Nurse ostomy consult note Stoma type/location: left colostomy created in April 2020 (Dr. Marcello Moores) Stomal assessment/size: Not measured today Peristomal assessment: not seen today as pouch was just applied this morning and is intact. Treatment options for stomal/peristomal skin: N/A Output: high output liquid thin effluent Ostomy pouching: 1pc.convex with skin barrier ring  Education provided: Patient is independent at home with ostomy care, is overwhelmed by current high output status and is becoming dehydrated.   She is assured that she can call nursing staff for assistance with emptying and that they will additionally check on her to empty pouch often. Staff can assist her with pouch change should it become necessary,  But her pouches typically last 3-5 days and one was applied today.  Supplies ordered (4 pouches, 4 skin barrier rings). Supply numbers provided in the Orders.  Enrolled patient in [facility] program: Yes, previously  [facility] nursing team will not follow, but will remain available to this patient, the nursing and medical teams.  Please re-consult if needed. Thanks, Maudie Flakes, MSN, RN, [place], Arther Abbott  Pager# [phone]

## 2019-05-03 NOTE — ED Notes (Signed)
Bed: GS93 Expected date:  Expected time:  Means of arrival:  Comments: EMS 78 yo CA pt, weakness

## 2019-05-03 NOTE — ED Provider Notes (Signed)
Medical screening examination/treatment/procedure(s) were conducted as a shared visit with non-physician practitioner(s) and myself.  I personally evaluated the patient during the encounter. Briefly, the patient is a 78 y.o. female with history of metastatic colon cancer currently on chemotherapy.  She presents with general weakness.  Patient with no particular pain.  No shortness of breath, no chest pain, no abdominal pain.  Lab work has already been done that shows no significant anemia, electrolyte abnormality, kidney injury.  Patient feels better after IV fluids.  Would like to be discharged home if possible.  Awaiting urinalysis.  If urinalysis is unremarkable likely okay for home.  If infectious may benefit from IV antibiotics and IV fluids.  Patient hemodynamically stable throughout my care.  CT of the head unremarkable.  No concern for stroke.  Likely failure to thrive due to chronic illness with colon cancer.  This chart was dictated using voice recognition software.  Despite best efforts to proofread,  errors can occur which can change the documentation meaning.     EKG Interpretation None           Lennice Sites, DO 05/03/19 1332

## 2019-05-03 NOTE — ED Provider Notes (Signed)
[name] DEPT Provider Note   CSN: [id number] Arrival date & time: 05/03/19  9242     History   Chief Complaint No chief complaint on file.   HPI Nichole Cross is a 78 y.o. female who presents with weakness. PMH significant for metastatic rectal cancer undergoing chemotherapy, chronic diarrhea, bilateral DVT currently on Lovenox.  The patient is a poor historian.  She was recently admitted to the hospital for severe dehydration.  She was discharged on June 13.  She followed up with her oncologist yesterday and received IV fluids in the office and tincture of opium was added to help with her chronic diarrhea.  She is also taking Imodium, Lomotil, Metamucil.  She has a poor performance status per her oncologist note.  The patient states that her daughter called EMS today because she was confused and has been getting weaker.  Patient reports that the confusion is new.  She denies fever, headache, dizziness, changes in her vision, chest pain, shortness of breath, cough, abdominal pain, nausea, vomiting. Her urine has been dark brown and malodorous. She has rectal pain which is chronic. March 15th. Lovenox   Oncologist: Dr. Learta Codding Daughter: Nichole Cross   HPI  Past Medical History:  Diagnosis Date  . Anemia   . Arthritis    knees  . Blood dyscrasia 2016   microcytic anemia, likely iron deficiency anemia secondary to colon cancer  . Cataracts, bilateral   . Colon cancer ([place]) 06/2015   tubular villous adenoma on sigmoid colon specimen (08/09/15), myltiple tubular adenomas removed on a colonoscopy  11/16/15  . Complication of anesthesia   . Fatty liver   . Headache(784.0)    migraines  . Hypothyroidism   . Liver metastasis ([place])   . Metastasis to kidney ([place])   . Osteoporosis   . PONV (postoperative nausea and vomiting)    NAUSEA  . Renal carcinoma ([place]) 06/2016   ? primary  . Secondary liver cancer ([place]) 06/2016   ? metastasis from colon     Patient Active Problem List   Diagnosis Date Noted  . Leg DVT (deep venous thromboembolism), acute, bilateral ([place]) 04/30/2019  . Near syncope   . Severe dehydration 04/25/2019  . Dehydration 04/24/2019  . Acute lower UTI 04/24/2019  . Port-A-Cath in place 04/19/2019  . Goals of care, counseling/discussion 03/22/2019  . Local recurrence of rectal cancer  ([place]) 01/09/2017  . Adenocarcinoma of colon metastatic to liver ([place]) 07/25/2016  . Osteopenia, h/o OP 06/07/2016  . Anemia, iron deficiency 06/07/2016  . Sigmoid colon cancer s/p lap assisted sigmoid colectomy 08/09/15 08/09/2015  . Overweight (BMI 25.0-29.9) 09/10/2012  . S/P left UKR 09/09/2012  . ELEVATED BLOOD PRESSURE WITHOUT DIAGNOSIS OF HYPERTENSION 09/30/2010  . PHARYNGITIS 02/17/2008  . GANGLION OF TENDON SHEATH 12/20/2007  . FATTY LIVER DISEASE 08/13/2007  . Hypothyroidism 08/02/2007  . CYSTITIS 08/02/2007  . Osteoarthritis 08/02/2007    Past Surgical History:  Procedure Laterality Date  . ABDOMINAL HYSTERECTOMY  1993   with bilateral BSO  . abdominal tumor  1993  . APPENDECTOMY    . COLON RESECTION N/A 03/02/2019   Procedure: LAPAROSCOPY COLOSTOMY CREATION, SMALL BOWEL RESECTION, BLADDER REPAIR;  Surgeon: Leighton Ruff, MD;  Location: [facility];  Service: General;  Laterality: N/A;  . COLON SURGERY     partial colectomy  . IR GENERIC HISTORICAL  07/01/2016   IR RADIOLOGIST EVAL & MGMT 07/01/2016 Greggory Keen, MD [facility] INTERV RAD  . IR GENERIC HISTORICAL  08/14/2016  IR RADIOLOGIST EVAL & MGMT 08/14/2016 Jacqulynn Cadet, MD [facility] INTERV RAD  . IR RADIOLOGIST EVAL & MGMT  12/24/2017  . IR RADIOLOGIST EVAL & MGMT  04/22/2018  . LAPAROSCOPIC PARTIAL COLECTOMY N/A 08/09/2015   Procedure: LAPAROSCOPIC PARTIAL COLECTOMY;  Surgeon: Jackolyn Confer, MD;  Location: [facility];  Service: General;  Laterality: N/A;  . PARTIAL KNEE ARTHROPLASTY  09/09/2012   Procedure: UNICOMPARTMENTAL KNEE;  Surgeon: Mauri Pole, MD;  Location: [facility];  Service: Orthopedics;  Laterality: Left;  . PORTACATH PLACEMENT Right 03/02/2019   Procedure: INSERTION PORT-A-CATH WITH [place];  Surgeon: Leighton Ruff, MD;  Location: [facility];  Service: General;  Laterality: Right;  . [place] LIVER TUMOR  2018  . XI ROBOTIC ASSISTED LOWER ANTERIOR RESECTION N/A 01/09/2017   Procedure: XI ROBOTIC ASSISTED LOWER ANTERIOR RESECTION;  Surgeon: Leighton Ruff, MD;  Location: [facility];  Service: General;  Laterality: N/A;     OB History   No obstetric history on file.      Home Medications    Prior to Admission medications   Medication Sig Start Date End Date Taking? Authorizing Provider  diphenoxylate-atropine (LOMOTIL) 2.5-0.025 MG tablet Take 2 tablets by mouth 4 (four) times daily. 04/30/19   Eugenie Filler, MD  enoxaparin (LOVENOX) 80 MG/0.8ML injection Inject 0.8 mLs (80 mg total) into the skin daily for 30 days. 05/01/19 05/31/19  Eugenie Filler, MD  Ensure (ENSURE) Take 237 mLs by mouth 2 (two) times daily.    [provider]  ferrous sulfate 325 (65 FE) MG EC tablet Take 325 mg by mouth daily.     [provider]  levothyroxine (SYNTHROID, LEVOTHROID) 150 MCG tablet Take 1 tablet (150 mcg total) by mouth daily. 02/25/18   [name], MD  loperamide (IMODIUM) 2 MG capsule Take 1 capsule (2 mg total) by mouth 4 (four) times daily. 04/30/19   Eugenie Filler, MD  Opium 10 MG/ML (1%) TINC Take 0.6 mLs (6 mg total) by mouth 4 (four) times daily. 05/02/19   Ladell Pier, MD  oxyCODONE-acetaminophen (PERCOCET) 5-325 MG tablet Take 1-2 tablets by mouth every 4 (four) hours as needed for severe pain. 04/19/19   Owens Shark, NP  potassium chloride SA (K-DUR) 20 MEQ tablet Take 1 tablet (20 mEq total) by mouth daily. 04/04/19   Owens Shark, NP  predniSONE (DELTASONE) 10 MG tablet Take 1 tablet (10 mg total) by mouth daily with breakfast. 05/01/19   Eugenie Filler, MD  prochlorperazine (COMPAZINE) 10 MG tablet  Take 1 tablet (10 mg total) by mouth every 6 (six) hours as needed for nausea or vomiting. 03/30/19   Ladell Pier, MD  temazepam (RESTORIL) 15 MG capsule Take 1 capsule (15 mg total) by mouth at bedtime as needed for sleep. 04/18/19   Ladell Pier, MD  traMADol (ULTRAM) 50 MG tablet Take 1 tablet (50 mg total) by mouth every 6 (six) hours as needed. Patient taking differently: Take 50 mg by mouth every 6 (six) hours as needed (knee pain).  12/14/18   Owens Shark, NP  vitamin B-12 (CYANOCOBALAMIN) 1000 MCG tablet Take 1 tablet (1,000 mcg total) by mouth daily. 03/30/19   Ladell Pier, MD    Family History Family History  Problem Relation Age of Onset  . Pancreatic cancer Mother   . Parkinson's disease Mother   . Breast cancer Sister   . Parkinson's disease Brother   . Parkinson's disease Brother   .  Colon cancer Neg Hx   . Esophageal cancer Neg Hx   . Liver cancer Neg Hx   . Rectal cancer Neg Hx   . Stomach cancer Neg Hx     Social History Social History   Tobacco Use  . Smoking status: Never Smoker  . Smokeless tobacco: Never Used  Substance Use Topics  . Alcohol use: No  . Drug use: No     Allergies   Lactose intolerance (gi), Aspirin, and Morphine and related   Review of Systems Review of Systems  Constitutional: Negative for fever.  Respiratory: Negative for shortness of breath.   Cardiovascular: Negative for chest pain.  Gastrointestinal: Positive for diarrhea and rectal pain. Negative for abdominal pain, nausea and vomiting.  Genitourinary: Negative for dysuria.  Allergic/Immunologic: Positive for immunocompromised state.  Neurological: Positive for weakness. Negative for syncope.  All other systems reviewed and are negative.    Physical Exam Updated Vital Signs BP 111/68   Pulse 79   Temp 98.1 F (36.7 C) (Oral)   Resp 16   Ht 5\' 7"  (1.702 m)   Wt 60.8 kg   SpO2 98%   BMI 20.99 kg/m   Physical Exam Vitals signs and nursing note  reviewed.  Constitutional:      General: She is not in acute distress.    Appearance: Normal appearance. She is well-developed. She is not ill-appearing.     Comments: Elderly female in NAD. Calm. Fatigued appearing  HENT:     Head: Normocephalic and atraumatic.     Mouth/Throat:     Mouth: Mucous membranes are dry.  Eyes:     General: No scleral icterus.       Right eye: No discharge.        Left eye: No discharge.     Conjunctiva/sclera: Conjunctivae normal.     Pupils: Pupils are equal, round, and reactive to light.  Neck:     Musculoskeletal: Normal range of motion.  Cardiovascular:     Rate and Rhythm: Regular rhythm. Tachycardia present.  Pulmonary:     Effort: Pulmonary effort is normal. No respiratory distress.     Breath sounds: Normal breath sounds.  Abdominal:     General: There is no distension.     Palpations: Abdomen is soft.     Tenderness: There is no abdominal tenderness.     Comments: Ileostomy with black liquid output  Skin:    General: Skin is warm and dry.  Neurological:     Mental Status: She is alert and oriented to person, place, and time.  Psychiatric:        Behavior: Behavior normal.      ED Treatments / Results  Labs (all labs ordered are listed, but only abnormal results are displayed) Labs Reviewed  CBC WITH DIFFERENTIAL/PLATELET - Abnormal; Notable for the following components:      Result Value   RBC 3.17 (*)    Hemoglobin 9.7 (*)    HCT 32.1 (*)    MCV 101.3 (*)    RDW 17.6 (*)    Lymphs Abs 0.5 (*)    Abs Immature Granulocytes 0.34 (*)    All other components within normal limits  COMPREHENSIVE METABOLIC PANEL - Abnormal; Notable for the following components:   Sodium 133 (*)    BUN 24 (*)    Calcium 7.6 (*)    Total Protein 4.8 (*)    Albumin 2.1 (*)    Alkaline Phosphatase 466 (*)    All  other components within normal limits  URINALYSIS, ROUTINE W REFLEX MICROSCOPIC - Abnormal; Notable for the following components:    APPearance TURBID (*)    Hgb urine dipstick LARGE (*)    Protein, ur 30 (*)    Leukocytes,Ua LARGE (*)    WBC, UA >50 (*)    Bacteria, UA MANY (*)    All other components within normal limits  AMMONIA - Abnormal; Notable for the following components:   Ammonia 68 (*)    All other components within normal limits  URINE CULTURE  MAGNESIUM  LIPASE, BLOOD    EKG None  Radiology Ct Head Wo Contrast  Result Date: 05/03/2019 CLINICAL DATA:  Weakness. The patient has been unable to eat for the past 24 hours. EXAM: CT HEAD WITHOUT CONTRAST TECHNIQUE: Contiguous axial images were obtained from the base of the skull through the vertex without intravenous contrast. COMPARISON:  Head CT scan 12/28/2016. FINDINGS: Brain: No evidence of acute infarction, hemorrhage, hydrocephalus, extra-axial collection or mass lesion/mass effect. Cortical atrophy noted. Vascular: No hyperdense vessel or unexpected calcification. Skull: Intact.  No focal lesion. Sinuses/Orbits: Negative. Other: None. IMPRESSION: No acute abnormality. Cortical atrophy. Electronically Signed   By: Inge Rise M.D.   On: 05/03/2019 11:59    Procedures Procedures (including critical care time)  Medications Ordered in ED Medications  sodium chloride 0.9 % bolus 1,000 mL (0 mLs Intravenous Stopped 05/03/19 1158)  sodium chloride 0.9 % bolus 1,000 mL (1,000 mLs Intravenous New Bag/Given 05/03/19 1450)     Initial Impression / Assessment and Plan / ED Course  I have reviewed the triage vital signs and the nursing notes.  Pertinent labs & imaging results that were available during my care of the patient were reviewed by me and considered in my medical decision making (see chart for details).  78 year old female with metastatic rectal cancer presents with worsening weakness and now new confusion. Daughter is concerned about her urine which is dark, cloudy, and malodorous. BP is soft here but otherwise vitals are normal. Pt appears  fatigued on exam but is afebrile and does not complain of significant pain. Presentation is very similar to her last admission last week. Will start fluids and obtain labs, CT head due to new confusion.  CBC is remarkable for normal WBC count and anemia which around baseline. CMP is remarkable for mild hyponatremia (133), hypocalcemia (7.6), low protein (4.8) which is most likely from malnutrition and minimal PO intake. AP is elevated chronically.   Shared visit with Dr. Ronnald Nian. After 2L BP is improved slightly to 076A systolic. Initially the patient requested to go home. UA was still pending at that time. I discussed with the patient's daughter, Nichole Cross, her wishes. She is concerned about her urine.  UA shows turbid appearance, large hgb, 30 protein, large leukocytes, >50 WBC, and many bacteria. Her previous culture grew out multiple species. Will resend culture and start Rocephin. Discussed with the patient again. At this time she prefers admission. Due to significant weakness and evidence of ongoing UTI with new confusion, will request admission.  Discussed with Dr. Grandville Silos with [facility] who will admit.  Final Clinical Impressions(s) / ED Diagnoses   Final diagnoses:  None    ED Discharge Orders    None       Recardo Evangelist, PA-C 05/04/19 2633    Lennice Sites, DO 05/04/19 [place], [place], DO 05/04/19 3545

## 2019-05-03 NOTE — ED Notes (Signed)
Pt difficult stick, RN and NT attempt.  Unable to obtain blood cultures at this time.

## 2019-05-03 NOTE — ED Notes (Signed)
ED TO INPATIENT HANDOFF REPORT  Name/Age/Gender Nichole Cross 78 y.o. female  Code Status Code Status History    Date Active Date Inactive Code Status Order ID Comments User Context   04/24/2019 1151 04/30/2019 1904 Full Code [id number]  Mendel Corning, MD Inpatient   03/02/2019 1436 03/10/2019 1707 Full Code [id number]  Leighton Ruff, MD Inpatient   01/09/2017 1650 01/14/2017 1459 Full Code [id number]  Leighton Ruff, MD Inpatient   08/09/2015 1443 08/13/2015 1943 Full Code [id number]  Jackolyn Confer, MD Inpatient   09/09/2012 1128 09/10/2012 1800 Full Code [id number]  Jackelyn Knife, RN Inpatient   Advance Care Planning Activity      Home/SNF/Other Home  Chief Complaint Weakness  Level of Care/Admitting Diagnosis ED Disposition    ED Disposition Condition [place]: [facility] [[id number]]  Level of Care: Telemetry [5]  Admit to tele based on following criteria: Other see comments  Comments: WEAKNESS/ RECENT dvt/ HYPOTENSION  Covid Evaluation: Screening Protocol (No Symptoms)  Diagnosis: Weakness [009381]  Admitting Physician: Eugenie Filler [[id number]]  Attending Physician: Eugenie Filler [[id number]]  PT Class (Do Not Modify): Observation [104]  PT Acc Code (Do Not Modify): Observation [10022]       Medical History Past Medical History:  Diagnosis Date  . Anemia   . Arthritis    knees  . Blood dyscrasia 2016   microcytic anemia, likely iron deficiency anemia secondary to colon cancer  . Cataracts, bilateral   . Colon cancer ([place]) 06/2015   tubular villous adenoma on sigmoid colon specimen (08/09/15), myltiple tubular adenomas removed on a colonoscopy  11/16/15  . Complication of anesthesia   . Fatty liver   . Headache(784.0)    migraines  . Hypothyroidism   . Liver metastasis ([place])   . Metastasis to kidney ([place])   . Osteoporosis   . PONV (postoperative nausea and vomiting)    NAUSEA  . Renal carcinoma ([place]) 06/2016    ? primary  . Secondary liver cancer ([place]) 06/2016   ? metastasis from colon    Allergies Allergies  Allergen Reactions  . Lactose Intolerance (Gi) Diarrhea    gas  . Aspirin Itching  . Morphine And Related Rash    "veins popped up and they gave me benadryl    IV Location/Drains/Wounds Patient Lines/Drains/Airways Status   Active Line/Drains/Airways    Name:   Placement date:   Placement time:   Site:   Days:   Implanted Port 03/02/19 Right Chest   03/02/19    1213    Chest   62   Colostomy LLQ   03/02/19    1119    LLQ   62   Incision (Closed) 03/02/19 Abdomen Other (Comment)   03/02/19    1117     62   Incision (Closed) 03/02/19 Chest Right   03/02/19    1234     62   Incision - 3 Ports Abdomen Right;Mid;Upper Left;Mid;Upper Left;Mid;Lower   03/02/19    0915     53          Labs/Imaging Results for orders placed or performed during the hospital encounter of 05/03/19 (from the past 48 hour(s))  Ammonia     Status: Abnormal   Collection Time: 05/03/19 10:32 AM  Result Value Ref Range   Ammonia 68 (H) 9 - 35 umol/L    Comment: Performed at [facility], [place] [address] [place], [place] [address]  Magnesium     Status: None   Collection Time: 05/03/19 10:34 AM  Result Value Ref Range   Magnesium 1.9 1.7 - 2.4 mg/dL    Comment: Performed at [facility], [place] [address], [place], [place] [address]  CBC with Differential     Status: Abnormal   Collection Time: 05/03/19 10:34 AM  Result Value Ref Range   WBC 7.8 4.0 - 10.5 K/uL   RBC 3.17 (L) 3.87 - 5.11 MIL/uL   Hemoglobin 9.7 (L) 12.0 - 15.0 g/dL   HCT 32.1 (L) 36.0 - 46.0 %   MCV 101.3 (H) 80.0 - 100.0 fL   MCH 30.6 26.0 - 34.0 pg   MCHC 30.2 30.0 - 36.0 g/dL   RDW 17.6 (H) 11.5 - 15.5 %   Platelets 324 150 - 400 K/uL   nRBC 0.0 0.0 - 0.2 %   Neutrophils Relative % 82 %   Neutro Abs 6.4 1.7 - 7.7 K/uL   Lymphocytes Relative 6 %   Lymphs Abs 0.5 (L) 0.7 - 4.0 K/uL    Monocytes Relative 8 %   Monocytes Absolute 0.6 0.1 - 1.0 K/uL   Eosinophils Relative 0 %   Eosinophils Absolute 0.0 0.0 - 0.5 K/uL   Basophils Relative 0 %   Basophils Absolute 0.0 0.0 - 0.1 K/uL   Immature Granulocytes 4 %   Abs Immature Granulocytes 0.34 (H) 0.00 - 0.07 K/uL    Comment: Performed at [facility], [place] [address] [place], [place] [address]  Comprehensive metabolic panel     Status: Abnormal   Collection Time: 05/03/19 10:34 AM  Result Value Ref Range   Sodium 133 (L) 135 - 145 mmol/L   Potassium 4.3 3.5 - 5.1 mmol/L   Chloride 98 98 - 111 mmol/L   CO2 22 22 - 32 mmol/L   Glucose, Bld 76 70 - 99 mg/dL   BUN 24 (H) 8 - 23 mg/dL   Creatinine, Ser 0.62 0.44 - 1.00 mg/dL   Calcium 7.6 (L) 8.9 - 10.3 mg/dL   Total Protein 4.8 (L) 6.5 - 8.1 g/dL   Albumin 2.1 (L) 3.5 - 5.0 g/dL   AST 25 15 - 41 U/L   ALT 18 0 - 44 U/L   Alkaline Phosphatase 466 (H) 38 - 126 U/L   Total Bilirubin 0.5 0.3 - 1.2 mg/dL   GFR calc non Af Amer >60 >60 mL/min   GFR calc Af Amer >60 >60 mL/min   Anion gap 13 5 - 15    Comment: Performed at [facility], [place] [address], [place], [place] [address]  Lipase, blood     Status: None   Collection Time: 05/03/19 10:34 AM  Result Value Ref Range   Lipase 18 11 - 51 U/L    Comment: Performed at [facility], [place] [address], [place], [place] [address]  Urinalysis, Routine w reflex microscopic     Status: Abnormal   Collection Time: 05/03/19  2:25 PM  Result Value Ref Range   Color, Urine YELLOW YELLOW   APPearance TURBID (A) CLEAR   Specific Gravity, Urine 1.008 1.005 - 1.030   pH 8.0 5.0 - 8.0   Glucose, UA NEGATIVE NEGATIVE mg/dL   Hgb urine dipstick LARGE (A) NEGATIVE   Bilirubin Urine NEGATIVE NEGATIVE   Ketones, ur NEGATIVE NEGATIVE mg/dL   Protein, ur 30 (A) NEGATIVE mg/dL   Nitrite NEGATIVE NEGATIVE   Leukocytes,Ua LARGE (A) NEGATIVE  WBC, UA >50 (H) 0 - 5 WBC/hpf   Bacteria,  UA MANY (A) NONE SEEN    Comment: Performed at [facility], [place] [address], [place], [place] [address]   Ct Head Wo Contrast  Result Date: 05/03/2019 CLINICAL DATA:  Weakness. The patient has been unable to eat for the past 24 hours. EXAM: CT HEAD WITHOUT CONTRAST TECHNIQUE: Contiguous axial images were obtained from the base of the skull through the vertex without intravenous contrast. COMPARISON:  Head CT scan 12/28/2016. FINDINGS: Brain: No evidence of acute infarction, hemorrhage, hydrocephalus, extra-axial collection or mass lesion/mass effect. Cortical atrophy noted. Vascular: No hyperdense vessel or unexpected calcification. Skull: Intact.  No focal lesion. Sinuses/Orbits: Negative. Other: None. IMPRESSION: No acute abnormality. Cortical atrophy. Electronically Signed   By: Inge Rise M.D.   On: 05/03/2019 11:59    Pending Labs Unresulted Labs (From admission, onward)    Start     Ordered   05/03/19 1640  Culture, blood (Routine X 2) w Reflex to ID Panel  BLOOD CULTURE X 2,   R (with STAT occurrences)     05/03/19 1639   05/03/19 1610  SARS Coronavirus 2 (CEPHEID - Performed in [facility] hospital lab), Hosp Order  (Asymptomatic Patients Labs)  Once,   STAT    Question:  Rule Out  Answer:  Yes   05/03/19 1609   05/03/19 1514  Urine culture  Add-on,   AD     05/03/19 1513          Vitals/Pain Today's Vitals   05/03/19 1430 05/03/19 1500 05/03/19 1550 05/03/19 1630  BP: 99/67 97/65 111/68 107/71  Pulse:   79 73  Resp: 15 13 16 18   Temp:      TempSrc:      SpO2: 97% 96% 98% 98%  Weight:      Height:      PainSc:        Isolation Precautions No active isolations  Medications Medications  0.9 %  sodium chloride infusion (has no administration in time range)  sodium chloride 0.9 % bolus 1,000 mL (0 mLs Intravenous Stopped 05/03/19 1158)  sodium chloride 0.9 % bolus 1,000 mL (0 mLs Intravenous Stopped 05/03/19 1626)  cefTRIAXone (ROCEPHIN) 1 g in  sodium chloride 0.9 % 100 mL IVPB (0 g Intravenous Stopped 05/03/19 1726)    Mobility walks with person assist

## 2019-05-04 ENCOUNTER — Inpatient Hospital Stay: Payer: Medicare Other

## 2019-05-04 DIAGNOSIS — Z515 Encounter for palliative care: Secondary | ICD-10-CM | POA: Diagnosis not present

## 2019-05-04 DIAGNOSIS — C641 Malignant neoplasm of right kidney, except renal pelvis: Secondary | ICD-10-CM | POA: Diagnosis present

## 2019-05-04 DIAGNOSIS — Z95828 Presence of other vascular implants and grafts: Secondary | ICD-10-CM

## 2019-05-04 DIAGNOSIS — D539 Nutritional anemia, unspecified: Secondary | ICD-10-CM

## 2019-05-04 DIAGNOSIS — D6481 Anemia due to antineoplastic chemotherapy: Secondary | ICD-10-CM | POA: Diagnosis not present

## 2019-05-04 DIAGNOSIS — Z20828 Contact with and (suspected) exposure to other viral communicable diseases: Secondary | ICD-10-CM | POA: Diagnosis present

## 2019-05-04 DIAGNOSIS — M810 Age-related osteoporosis without current pathological fracture: Secondary | ICD-10-CM | POA: Diagnosis present

## 2019-05-04 DIAGNOSIS — R627 Adult failure to thrive: Secondary | ICD-10-CM | POA: Diagnosis present

## 2019-05-04 DIAGNOSIS — E860 Dehydration: Secondary | ICD-10-CM | POA: Diagnosis not present

## 2019-05-04 DIAGNOSIS — N2889 Other specified disorders of kidney and ureter: Secondary | ICD-10-CM

## 2019-05-04 DIAGNOSIS — Z6824 Body mass index (BMI) 24.0-24.9, adult: Secondary | ICD-10-CM | POA: Diagnosis not present

## 2019-05-04 DIAGNOSIS — N179 Acute kidney failure, unspecified: Secondary | ICD-10-CM | POA: Diagnosis present

## 2019-05-04 DIAGNOSIS — Z886 Allergy status to analgesic agent status: Secondary | ICD-10-CM | POA: Diagnosis not present

## 2019-05-04 DIAGNOSIS — R531 Weakness: Secondary | ICD-10-CM | POA: Diagnosis not present

## 2019-05-04 DIAGNOSIS — I82452 Acute embolism and thrombosis of left peroneal vein: Secondary | ICD-10-CM | POA: Diagnosis not present

## 2019-05-04 DIAGNOSIS — Z923 Personal history of irradiation: Secondary | ICD-10-CM

## 2019-05-04 DIAGNOSIS — Z885 Allergy status to narcotic agent status: Secondary | ICD-10-CM | POA: Diagnosis not present

## 2019-05-04 DIAGNOSIS — I82412 Acute embolism and thrombosis of left femoral vein: Secondary | ICD-10-CM | POA: Diagnosis present

## 2019-05-04 DIAGNOSIS — G9341 Metabolic encephalopathy: Secondary | ICD-10-CM | POA: Diagnosis not present

## 2019-05-04 DIAGNOSIS — E872 Acidosis: Secondary | ICD-10-CM | POA: Diagnosis present

## 2019-05-04 DIAGNOSIS — Z7189 Other specified counseling: Secondary | ICD-10-CM | POA: Diagnosis not present

## 2019-05-04 DIAGNOSIS — C187 Malignant neoplasm of sigmoid colon: Secondary | ICD-10-CM

## 2019-05-04 DIAGNOSIS — D509 Iron deficiency anemia, unspecified: Secondary | ICD-10-CM | POA: Diagnosis not present

## 2019-05-04 DIAGNOSIS — Z933 Colostomy status: Secondary | ICD-10-CM

## 2019-05-04 DIAGNOSIS — K912 Postsurgical malabsorption, not elsewhere classified: Secondary | ICD-10-CM | POA: Diagnosis present

## 2019-05-04 DIAGNOSIS — E039 Hypothyroidism, unspecified: Secondary | ICD-10-CM | POA: Diagnosis present

## 2019-05-04 DIAGNOSIS — Z9221 Personal history of antineoplastic chemotherapy: Secondary | ICD-10-CM

## 2019-05-04 DIAGNOSIS — D630 Anemia in neoplastic disease: Secondary | ICD-10-CM | POA: Diagnosis not present

## 2019-05-04 DIAGNOSIS — R4182 Altered mental status, unspecified: Secondary | ICD-10-CM | POA: Diagnosis not present

## 2019-05-04 DIAGNOSIS — C189 Malignant neoplasm of colon, unspecified: Secondary | ICD-10-CM | POA: Diagnosis not present

## 2019-05-04 DIAGNOSIS — I82453 Acute embolism and thrombosis of peroneal vein, bilateral: Secondary | ICD-10-CM

## 2019-05-04 DIAGNOSIS — D589 Hereditary hemolytic anemia, unspecified: Secondary | ICD-10-CM | POA: Diagnosis present

## 2019-05-04 DIAGNOSIS — G893 Neoplasm related pain (acute) (chronic): Secondary | ICD-10-CM | POA: Diagnosis not present

## 2019-05-04 DIAGNOSIS — I82442 Acute embolism and thrombosis of left tibial vein: Secondary | ICD-10-CM

## 2019-05-04 DIAGNOSIS — K760 Fatty (change of) liver, not elsewhere classified: Secondary | ICD-10-CM | POA: Diagnosis present

## 2019-05-04 DIAGNOSIS — R197 Diarrhea, unspecified: Secondary | ICD-10-CM | POA: Diagnosis not present

## 2019-05-04 DIAGNOSIS — Z9049 Acquired absence of other specified parts of digestive tract: Secondary | ICD-10-CM | POA: Diagnosis not present

## 2019-05-04 DIAGNOSIS — R4020 Unspecified coma: Secondary | ICD-10-CM | POA: Diagnosis not present

## 2019-05-04 DIAGNOSIS — C787 Secondary malignant neoplasm of liver and intrahepatic bile duct: Secondary | ICD-10-CM

## 2019-05-04 DIAGNOSIS — R52 Pain, unspecified: Secondary | ICD-10-CM | POA: Diagnosis not present

## 2019-05-04 DIAGNOSIS — E739 Lactose intolerance, unspecified: Secondary | ICD-10-CM | POA: Diagnosis present

## 2019-05-04 DIAGNOSIS — I82451 Acute embolism and thrombosis of right peroneal vein: Secondary | ICD-10-CM | POA: Diagnosis not present

## 2019-05-04 DIAGNOSIS — N3090 Cystitis, unspecified without hematuria: Secondary | ICD-10-CM | POA: Diagnosis present

## 2019-05-04 DIAGNOSIS — M2550 Pain in unspecified joint: Secondary | ICD-10-CM | POA: Diagnosis not present

## 2019-05-04 DIAGNOSIS — Z7401 Bed confinement status: Secondary | ICD-10-CM | POA: Diagnosis not present

## 2019-05-04 DIAGNOSIS — E162 Hypoglycemia, unspecified: Secondary | ICD-10-CM | POA: Diagnosis present

## 2019-05-04 LAB — COMPREHENSIVE METABOLIC PANEL
ALT: 14 U/L (ref 0–44)
AST: 19 U/L (ref 15–41)
Albumin: 1.7 g/dL — ABNORMAL LOW (ref 3.5–5.0)
Alkaline Phosphatase: 404 U/L — ABNORMAL HIGH (ref 38–126)
Anion gap: 10 (ref 5–15)
BUN: 22 mg/dL (ref 8–23)
CO2: 22 mmol/L (ref 22–32)
Calcium: 7.1 mg/dL — ABNORMAL LOW (ref 8.9–10.3)
Chloride: 105 mmol/L (ref 98–111)
Creatinine, Ser: 0.52 mg/dL (ref 0.44–1.00)
GFR calc Af Amer: >60 mL/min (ref >60)
GFR calc non Af Amer: >60 mL/min (ref >60)
Glucose, Bld: 64 mg/dL — ABNORMAL LOW (ref 70–99)
Potassium: 3.8 mmol/L (ref 3.5–5.1)
Sodium: 137 mmol/L (ref 135–145)
Total Bilirubin: 0.4 mg/dL (ref 0.3–1.2)
Total Protein: 4.3 g/dL — ABNORMAL LOW (ref 6.5–8.1)

## 2019-05-04 LAB — GLUCOSE, CAPILLARY
Glucose-Capillary: 45 mg/dL — ABNORMAL LOW (ref 70–99)
Glucose-Capillary: 46 mg/dL — ABNORMAL LOW (ref 70–99)
Glucose-Capillary: 114 mg/dL — ABNORMAL HIGH (ref 70–99)
Glucose-Capillary: 100 mg/dL — ABNORMAL HIGH (ref 70–99)
Glucose-Capillary: 122 mg/dL — ABNORMAL HIGH (ref 70–99)
Glucose-Capillary: 170 mg/dL — ABNORMAL HIGH (ref 70–99)

## 2019-05-04 LAB — CBC
HCT: 29 % — ABNORMAL LOW (ref 36.0–46.0)
Hemoglobin: 9 g/dL — ABNORMAL LOW (ref 12.0–15.0)
MCH: 32 pg (ref 26.0–34.0)
MCHC: 31 g/dL (ref 30.0–36.0)
MCV: 103.2 fL — ABNORMAL HIGH (ref 80.0–100.0)
Platelets: 242 10*3/uL (ref 150–400)
RBC: 2.81 MIL/uL — ABNORMAL LOW (ref 3.87–5.11)
RDW: 17.6 % — ABNORMAL HIGH (ref 11.5–15.5)
WBC: 5.1 10*3/uL (ref 4.0–10.5)
nRBC: 0 % (ref 0.0–0.2)

## 2019-05-04 MED ORDER — ADULT MULTIVITAMIN W/MINERALS CH
1.0000 | ORAL_TABLET | Freq: Every day | ORAL | Status: DC
  Administered 2019-05-04 – 2019-05-07 (×4): 1 via ORAL
  Filled 2019-05-04 (×3): qty 1

## 2019-05-04 MED ORDER — CALCIUM POLYCARBOPHIL 625 MG PO TABS
1250.0000 mg | ORAL_TABLET | Freq: Two times a day (BID) | ORAL | Status: DC
  Administered 2019-05-04 – 2019-05-07 (×8): 1250 mg via ORAL
  Filled 2019-05-04 (×14): qty 2

## 2019-05-04 MED ORDER — SODIUM CHLORIDE 0.9% FLUSH
10.0000 mL | Freq: Two times a day (BID) | INTRAVENOUS | Status: DC
  Administered 2019-05-04 – 2019-05-09 (×4): 10 mL

## 2019-05-04 MED ORDER — ENSURE ENLIVE PO LIQD
237.0000 mL | Freq: Three times a day (TID) | ORAL | Status: DC
  Administered 2019-05-04 – 2019-05-06 (×5): 237 mL via ORAL

## 2019-05-04 MED ORDER — DEXTROSE-NACL 5-0.9 % IV SOLN
INTRAVENOUS | Status: DC
  Administered 2019-05-04 – 2019-05-08 (×11): via INTRAVENOUS

## 2019-05-04 MED ORDER — SODIUM CHLORIDE 0.9% FLUSH
10.0000 mL | INTRAVENOUS | Status: DC | PRN
  Administered 2019-05-10: 10 mL
  Filled 2019-05-04: qty 40

## 2019-05-04 NOTE — Evaluation (Addendum)
Occupational Therapy Evaluation Patient Details Name: Nichole Cross MRN: [id number] DOB: 06/22/1941 Today's Date: 05/04/2019    History of Present Illness Nichole Cross is a 78 y.o. 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 Recently hospitalized 04/23/2021 04/30/2019 for bilateral lower extremity DVTs, near syncopal episode secondary to increased ostomy output.Pt admitted for dehydration and weakness   Clinical Impression   Pt was admitted for the above. She was limited during OT evaluation by pain and fatique.  She needed max A to stand from recliner and mod A to transfer to bed.  Pt needs extensive assistance for LB adls, both for reaching and for standing. Will follow in acute setting. Will focus on mobility to support ADLs to decrease caregiver burden. When this improves, will shift focus to improving adl function    Follow Up Recommendations  SNF(vs 24/7 and HHOT) would benefit from snf; pt wants home.  Needs max A to stand at this time    Equipment Recommendations  3 in 1 bedside commode(if she doesn't have this)    Recommendations for Other Services       Precautions / Restrictions Precautions Precautions: Fall Restrictions Weight Bearing Restrictions: No      Mobility Bed Mobility Overal bed mobility: Needs Assistance Bed Mobility: Supine to Sit      Sit to supine: Mod assist   General bed mobility comments: assist for bil LEs  Transfers Overall transfer level: Needs assistance Equipment used: Rolling walker (2 wheeled) Transfers: Sit to/from Stand Sit to Stand: Max assist Stand pivot transfers: Min assist       General transfer comment: max A to power up from recliner and min  A to take pivotal steps to bed    Balance Overall balance assessment: Needs assistance Sitting-balance support: No upper  extremity supported Sitting balance-Leahy Scale: Fair     Standing balance support: Bilateral upper extremity supported Standing balance-Leahy Scale: Poor                             ADL either performed or assessed with clinical judgement   ADL Overall ADL's : Needs assistance/impaired     Grooming: Set up   Upper Body Bathing: Set up   Lower Body Bathing: Maximal assistance   Upper Body Dressing : Set up   Lower Body Dressing: Total assistance   Toilet Transfer: Moderate assistance;Maximal assistance;RW;Stand-pivot(chair to bed)   Toileting- Clothing Manipulation and Hygiene: Maximal assistance;Sit to/from stand         General ADL Comments: pt limited by pain and weakness.  Very uncomfortable sitting up in chair. Assisted back to bed     Vision         Perception     Praxis      Pertinent Vitals/Pain Pain Assessment: Faces Faces Pain Scale: Hurts whole lot Pain Location: uretha when urinating Pain Descriptors / Indicators: Grimacing Pain Intervention(s): Limited activity within patient's tolerance;Monitored during session;Repositioned     Hand Dominance     Extremity/Trunk Assessment Upper Extremity Assessment Upper Extremity Assessment: Generalized weakness   Lower Extremity Assessment Lower Extremity Assessment: Generalized weakness       Communication Communication Communication: No difficulties   Cognition Arousal/Alertness: Awake/alert Behavior During Therapy: Flat affect Overall Cognitive Status: Within Functional Limits for tasks assessed  General Comments       Exercises General Exercises - Lower Extremity Ankle Circles/Pumps: AROM;Strengthening;Both;20 reps;Supine Quad Sets: AROM;Strengthening;Both;10 reps;Supine Short Arc Quad: AAROM;Strengthening;Both;10 reps Straight Leg Raises: AAROM;Strengthening;Both;10 reps;Supine   Shoulder Instructions      Home Living  Family/patient expects to be discharged to:: Private residence Living Arrangements: Children Available Help at Discharge: Family Type of Home: House Home Access: Stairs to enter Technical brewer of Steps: 4 Entrance Stairs-Rails: Right Home Layout: One level     Bathroom Shower/Tub: Teacher, early years/pre: [place]: Environmental consultant - 2 wheels;Cane - single point   Additional Comments: daughter can assist as needed      Prior Functioning/Environment Level of Independence: Independent with assistive device(s)        Comments: daughter assisted as needed with adls        OT Problem List: Decreased strength;Impaired balance (sitting and/or standing);Decreased knowledge of use of DME or AE;Decreased activity tolerance      OT Treatment/Interventions: Self-care/ADL training;Therapeutic exercise;Energy conservation;DME and/or AE instruction;Therapeutic activities;Patient/family education;Balance training    OT Goals(Current goals can be found in the care plan section) Acute Rehab OT Goals Patient Stated Goal: To get stronger and go home with daughter. OT Goal Formulation: With patient Time For Goal Achievement: 05/18/19 Potential to Achieve Goals: Good ADL Goals Pt Will Transfer to Toilet: with mod assist;bedside commode;stand pivot transfer Pt Will Perform Toileting - Clothing Manipulation and hygiene: with mod assist;sit to/from stand Additional ADL Goal #1: pt will go from sit to stand with min to mod A, depending on surface height and maintain static balance x 2 minutes with min guard assist for adls Additional ADL Goal #2: pt will participate in 15 minutes of light activity/exercise with 3 rest breaks to increase strength and endurance for adls  OT Frequency: Min 2X/week   Barriers to D/C:            Co-evaluation              AM-PAC OT "6 Clicks" Daily Activity     Outcome Measure Help from another person eating meals?: None Help  from another person taking care of personal grooming?: A Little Help from another person toileting, which includes using toliet, bedpan, or urinal?: A Lot Help from another person bathing (including washing, rinsing, drying)?: A Lot Help from another person to put on and taking off regular upper body clothing?: A Little Help from another person to put on and taking off regular lower body clothing?: Total 6 Click Score: 15   End of Session    Activity Tolerance: No increased pain;Patient limited by fatigue Patient left: in bed;with call bell/phone within reach;with bed alarm set  OT Visit Diagnosis: Unsteadiness on feet (R26.81);Muscle weakness (generalized) (M62.81);History of falling (Z91.81)                Time: 4917-9150 OT Time Calculation (min): 17 min Charges:  OT General Charges $OT Visit: 1 Visit OT Evaluation $OT Eval Low Complexity: [place], OTR/L Acute Rehabilitation Services [phone] [facility] pager [phone] office 05/04/2019  [place] 05/04/2019, 12:35 PM

## 2019-05-04 NOTE — TOC Initial Note (Signed)
Transition of Care [facility]) - Initial/Assessment Note    Patient Details  Name: Nichole Cross MRN: [id number] Date of Birth: 1941/07/22  Transition of [facility]) CM/SW Contact:    [name], RN Phone Number: 05/04/2019, 3:49 PM  Clinical Narrative: Spoke to patient in rm-informed of TP recc-SNF. Paitent wants home w/HHC-states her dtr [name] helps her,has rw.TC Ann [id number] [phone] vm w/call back#. Left [place] agency list in rm-await choice. Recc HHPT/OT/aide.                  Expected Discharge Plan: [place] Barriers to Discharge: Continued Medical Work up   Patient Goals and CMS Choice Patient states their goals for this hospitalization and ongoing recovery are:: go home CMS Medicare.gov Compare Post Acute Care list provided to:: Patient Choice offered to / list presented to : Patient  Expected Discharge Plan and Services Expected Discharge Plan: [place]   Discharge Planning Services: CM Consult Post Acute Care Choice: [place] arrangements for the past 2 months: Single Family Home                                      Prior Living Arrangements/Services Living arrangements for the past 2 months: Single Family Home Lives with:: Adult Children Patient language and need for interpreter reviewed:: Yes Do you feel safe going back to the place where you live?: Yes        Care giver support system in place?: Yes (comment) Current home services: DME(rw) Criminal Activity/Legal Involvement Pertinent to Current Situation/Hospitalization: No - Comment as needed  Activities of Daily Living Home Assistive Devices/Equipment: Bedside commode/3-in-1, Dentures (specify type), Cane (specify quad or straight), Walker (specify type), Wheelchair ADL Screening (condition at time of admission) Patient's cognitive ability adequate to safely complete daily activities?: Yes Is the patient deaf or have difficulty hearing?:  No Does the patient have difficulty seeing, even when wearing glasses/contacts?: No Does the patient have difficulty concentrating, remembering, or making decisions?: No Patient able to express need for assistance with ADLs?: Yes Does the patient have difficulty dressing or bathing?: Yes Independently performs ADLs?: No Communication: Independent Dressing (OT): Needs assistance Is this a change from baseline?: Pre-admission baseline Grooming: Needs assistance Is this a change from baseline?: Pre-admission baseline Feeding: Needs assistance Is this a change from baseline?: Pre-admission baseline Bathing: Needs assistance Is this a change from baseline?: Pre-admission baseline Toileting: Needs assistance Is this a change from baseline?: Pre-admission baseline In/Out Bed: Needs assistance Is this a change from baseline?: Pre-admission baseline Walks in Home: Needs assistance Is this a change from baseline?: Pre-admission baseline Does the patient have difficulty walking or climbing stairs?: Yes Weakness of Legs: Both Weakness of Arms/Hands: Both  Permission Sought/Granted Permission sought to share information with : Case Manager Permission granted to share information with : Yes, Verbal Permission Granted  Share Information with NAME: Renee Ramus     Permission granted to share info w Relationship: dtr  Permission granted to share info w Contact Information: [phone]  Emotional Assessment Appearance:: Appears stated age Attitude/Demeanor/Rapport: Gracious Affect (typically observed): Accepting Orientation: : Oriented to Self, Oriented to Place, Oriented to  Time, Oriented to Situation Alcohol / Substance Use: Never Used Psych Involvement: No (comment)  Admission diagnosis:  Weakness Patient Active Problem List   Diagnosis Date Noted  . Weakness 05/03/2019  .  Leg DVT (deep venous thromboembolism), acute, bilateral ([place]) 04/30/2019  . Near syncope   . Severe  dehydration 04/25/2019  . Dehydration 04/24/2019  . Acute lower UTI 04/24/2019  . Port-A-Cath in place 04/19/2019  . Goals of care, counseling/discussion 03/22/2019  . Local recurrence of rectal cancer  ([place]) 01/09/2017  . Adenocarcinoma of colon metastatic to liver ([place]) 07/25/2016  . Osteopenia, h/o OP 06/07/2016  . Anemia, iron deficiency 06/07/2016  . Sigmoid colon cancer s/p lap assisted sigmoid colectomy 08/09/15 08/09/2015  . Overweight (BMI 25.0-29.9) 09/10/2012  . S/P left UKR 09/09/2012  . ELEVATED BLOOD PRESSURE WITHOUT DIAGNOSIS OF HYPERTENSION 09/30/2010  . PHARYNGITIS 02/17/2008  . GANGLION OF TENDON SHEATH 12/20/2007  . FATTY LIVER DISEASE 08/13/2007  . Hypothyroidism 08/02/2007  . Cystitis 08/02/2007  . Osteoarthritis 08/02/2007   PCP:  [name], MD Pharmacy:   [facility] [address] [place] - [address] [place] [place] [address] Phone: [phone] Fax: [phone]     Social Determinants of Health (SDOH) Interventions    Readmission Risk Interventions Readmission Risk Prevention Plan 04/27/2019  Transportation Screening Complete  Medication Review Press photographer) Complete  HRI or [place] Complete  SW Recovery Care/Counseling Consult Complete  [place] Not Applicable  Some recent data might be hidden

## 2019-05-04 NOTE — Progress Notes (Signed)
Weakness  Subjective: Pt admitted for dehydration and weakness. ~2 wk s/p chemo.  We were asked to see her about ostomy output.  States she does not remember how many times a day she empties her bag.  2 L out since admission  Objective: Vital signs in last 24 hours: Temp:  [97.7 F (36.5 C)-98.1 F (36.7 C)] 98 F (36.7 C) (06/17 0445) Pulse Rate:  [58-94] 69 (06/17 0445) Resp:  [10-20] 16 (06/17 0445) BP: (91-132)/(64-82) 110/73 (06/17 0445) SpO2:  [95 %-100 %] 97 % (06/17 0445) Weight:  [62 kg-62.2 kg] 62 kg (06/16 2117) Last BM Date: 05/03/19(per ostomy)  Intake/Output from previous day: 06/16 0701 - 06/17 0700 In: 3887.7 [P.O.:360; I.V.:1477.7; IV Piggyback:2050] Out: 2325 [Urine:150; Stool:2175] Intake/Output this shift: Total I/O In: -  Out: 500 [Stool:500]  General appearance: alert and cooperative GI: normal findings: soft, non-tender ostomy pink and viable  Lab Results:  Results for orders placed or performed during the hospital encounter of 05/03/19 (from the past 24 hour(s))  Ammonia     Status: Abnormal   Collection Time: 05/03/19 10:32 AM  Result Value Ref Range   Ammonia 68 (H) 9 - 35 umol/L  Magnesium     Status: None   Collection Time: 05/03/19 10:34 AM  Result Value Ref Range   Magnesium 1.9 1.7 - 2.4 mg/dL  CBC with Differential     Status: Abnormal   Collection Time: 05/03/19 10:34 AM  Result Value Ref Range   WBC 7.8 4.0 - 10.5 K/uL   RBC 3.17 (L) 3.87 - 5.11 MIL/uL   Hemoglobin 9.7 (L) 12.0 - 15.0 g/dL   HCT 32.1 (L) 36.0 - 46.0 %   MCV 101.3 (H) 80.0 - 100.0 fL   MCH 30.6 26.0 - 34.0 pg   MCHC 30.2 30.0 - 36.0 g/dL   RDW 17.6 (H) 11.5 - 15.5 %   Platelets 324 150 - 400 K/uL   nRBC 0.0 0.0 - 0.2 %   Neutrophils Relative % 82 %   Neutro Abs 6.4 1.7 - 7.7 K/uL   Lymphocytes Relative 6 %   Lymphs Abs 0.5 (L) 0.7 - 4.0 K/uL   Monocytes Relative 8 %   Monocytes Absolute 0.6 0.1 - 1.0 K/uL   Eosinophils Relative 0 %   Eosinophils Absolute  0.0 0.0 - 0.5 K/uL   Basophils Relative 0 %   Basophils Absolute 0.0 0.0 - 0.1 K/uL   Immature Granulocytes 4 %   Abs Immature Granulocytes 0.34 (H) 0.00 - 0.07 K/uL  Comprehensive metabolic panel     Status: Abnormal   Collection Time: 05/03/19 10:34 AM  Result Value Ref Range   Sodium 133 (L) 135 - 145 mmol/L   Potassium 4.3 3.5 - 5.1 mmol/L   Chloride 98 98 - 111 mmol/L   CO2 22 22 - 32 mmol/L   Glucose, Bld 76 70 - 99 mg/dL   BUN 24 (H) 8 - 23 mg/dL   Creatinine, Ser 0.62 0.44 - 1.00 mg/dL   Calcium 7.6 (L) 8.9 - 10.3 mg/dL   Total Protein 4.8 (L) 6.5 - 8.1 g/dL   Albumin 2.1 (L) 3.5 - 5.0 g/dL   AST 25 15 - 41 U/L   ALT 18 0 - 44 U/L   Alkaline Phosphatase 466 (H) 38 - 126 U/L   Total Bilirubin 0.5 0.3 - 1.2 mg/dL   GFR calc non Af Amer >60 >60 mL/min   GFR calc Af Amer >60 >60 mL/min  Anion gap 13 5 - 15  Lipase, blood     Status: None   Collection Time: 05/03/19 10:34 AM  Result Value Ref Range   Lipase 18 11 - 51 U/L  Urinalysis, Routine w reflex microscopic     Status: Abnormal   Collection Time: 05/03/19  2:25 PM  Result Value Ref Range   Color, Urine YELLOW YELLOW   APPearance TURBID (A) CLEAR   Specific Gravity, Urine 1.008 1.005 - 1.030   pH 8.0 5.0 - 8.0   Glucose, UA NEGATIVE NEGATIVE mg/dL   Hgb urine dipstick LARGE (A) NEGATIVE   Bilirubin Urine NEGATIVE NEGATIVE   Ketones, ur NEGATIVE NEGATIVE mg/dL   Protein, ur 30 (A) NEGATIVE mg/dL   Nitrite NEGATIVE NEGATIVE   Leukocytes,Ua LARGE (A) NEGATIVE   WBC, UA >50 (H) 0 - 5 WBC/hpf   Bacteria, UA MANY (A) NONE SEEN  SARS Coronavirus 2 (CEPHEID - Performed in [place] hospital lab), Hosp Order     Status: None   Collection Time: 05/03/19  4:31 PM   Specimen: Nasopharyngeal Swab  Result Value Ref Range   SARS Coronavirus 2 NEGATIVE NEGATIVE  Magnesium     Status: None   Collection Time: 05/03/19  8:49 PM  Result Value Ref Range   Magnesium 1.9 1.7 - 2.4 mg/dL  Comprehensive metabolic panel      Status: Abnormal   Collection Time: 05/04/19  6:25 AM  Result Value Ref Range   Sodium 137 135 - 145 mmol/L   Potassium 3.8 3.5 - 5.1 mmol/L   Chloride 105 98 - 111 mmol/L   CO2 22 22 - 32 mmol/L   Glucose, Bld 64 (L) 70 - 99 mg/dL   BUN 22 8 - 23 mg/dL   Creatinine, Ser 0.52 0.44 - 1.00 mg/dL   Calcium 7.1 (L) 8.9 - 10.3 mg/dL   Total Protein 4.3 (L) 6.5 - 8.1 g/dL   Albumin 1.7 (L) 3.5 - 5.0 g/dL   AST 19 15 - 41 U/L   ALT 14 0 - 44 U/L   Alkaline Phosphatase 404 (H) 38 - 126 U/L   Total Bilirubin 0.4 0.3 - 1.2 mg/dL   GFR calc non Af Amer >60 >60 mL/min   GFR calc Af Amer >60 >60 mL/min   Anion gap 10 5 - 15  CBC     Status: Abnormal   Collection Time: 05/04/19  6:25 AM  Result Value Ref Range   WBC 5.1 4.0 - 10.5 K/uL   RBC 2.81 (L) 3.87 - 5.11 MIL/uL   Hemoglobin 9.0 (L) 12.0 - 15.0 g/dL   HCT 29.0 (L) 36.0 - 46.0 %   MCV 103.2 (H) 80.0 - 100.0 fL   MCH 32.0 26.0 - 34.0 pg   MCHC 31.0 30.0 - 36.0 g/dL   RDW 17.6 (H) 11.5 - 15.5 %   Platelets 242 150 - 400 K/uL   nRBC 0.0 0.0 - 0.2 %  Glucose, capillary     Status: Abnormal   Collection Time: 05/04/19  7:30 AM  Result Value Ref Range   Glucose-Capillary 45 (L) 70 - 99 mg/dL  Glucose, capillary     Status: Abnormal   Collection Time: 05/04/19  8:05 AM  Result Value Ref Range   Glucose-Capillary 46 (L) 70 - 99 mg/dL  Glucose, capillary     Status: Abnormal   Collection Time: 05/04/19  8:50 AM  Result Value Ref Range   Glucose-Capillary 114 (H) 70 - 99 mg/dL  Studies/Results Radiology     MEDS, Scheduled . cholestyramine  4 g Oral BID  . diphenoxylate-atropine  2 tablet Oral QID  . enoxaparin  90 mg Subcutaneous Q24H  . feeding supplement (ENSURE ENLIVE)  237 mL Oral BID BM  . ferrous sulfate  325 mg Oral Daily  . levothyroxine  150 mcg Oral Daily  . loperamide  2 mg Oral QID  . octreotide  50 mcg Subcutaneous Q12H  . polycarbophil  1,250 mg Oral BID  . potassium chloride SA  20 mEq Oral Daily  .  predniSONE  10 mg Oral Q breakfast  . sodium chloride flush  10-40 mL Intracatheter Q12H  . temazepam  15 mg Oral QHS  . vitamin B-12  1,000 mcg Oral Daily     Assessment: Weakness High output colostomy: suspect this is due to previous obstruction as well as chemo treatments.  Suspect this will gradually improve with time.  Plan: Agree with scheduled Lomotil and Imodium Questran and Octreotide added by medical team Cont iron supplement  I added a fiber pill bid to hopefully soak up some extra fluid in her stool If all of this fails, can add tincture of opium  Will sign off.  Please notify us of any other questions or concerns   LOS: 0 days    [name], MD [facility], [place]   05/04/2019 10:05 AM

## 2019-05-04 NOTE — Evaluation (Signed)
Physical Therapy Evaluation Patient Details Name: GHISLAINE HARCUM MRN: [id number] DOB: June 30, 1941 Today's Date: 05/04/2019   History of Present Illness  Alianny Toelle Shoun is a 78 y.o. 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 Recently hospitalized 04/23/2021 04/30/2019 for bilateral lower extremity DVTs, near syncopal episode secondary to increased ostomy output.Pt admitted for dehydration and weakness  Clinical Impression  Pt presents with the above diagnosis. Pt is currently mod to max assist with mobility and demonstrated significant LE weakness and general deconditioning. Pt would benefit from skilled PT to maximize mobility and improve independence. Pt stated she does not want to d/c to SNF as recommended by PT. Pt will need 24 hour supervision/assistance if d/c plan is for home. Also recommend HHPT services. Will continue to follow pt while an inpatient to facilitate improved mobility and independence.     Follow Up Recommendations SNF;Supervision/Assistance - 24 hour    Equipment Recommendations  None recommended by PT    Recommendations for Other Services       Precautions / Restrictions Precautions Precautions: Fall Restrictions Weight Bearing Restrictions: No      Mobility  Bed Mobility Overal bed mobility: Needs Assistance Bed Mobility: Supine to Sit     Supine to sit: Mod assist     General bed mobility comments: assistance with moving both legs the edge of bed and truck support for initial rise up.  Transfers Overall transfer level: Needs assistance Equipment used: Rolling walker (2 wheeled) Transfers: Sit to/from Stand Sit to Stand: Max assist Stand pivot transfers: Mod assist       General transfer comment: verbal cues for technique, assist to rise  Ambulation/Gait Ambulation/Gait assistance: Min  assist Gait Distance (Feet): 3 Feet Assistive device: Rolling walker (2 wheeled) Gait Pattern/deviations: Step-to pattern;Decreased stride length Gait velocity: decreased   General Gait Details: cues for technique and safety  Stairs            Wheelchair Mobility    Modified Rankin (Stroke Patients Only)       Balance Overall balance assessment: Needs assistance Sitting-balance support: No upper extremity supported Sitting balance-Leahy Scale: Fair     Standing balance support: Bilateral upper extremity supported Standing balance-Leahy Scale: Poor                               Pertinent Vitals/Pain Pain Assessment: No/denies pain    Home Living Family/patient expects to be discharged to:: Private residence Living Arrangements: Children Available Help at Discharge: Family Type of Home: House Home Access: Stairs to enter Entrance Stairs-Rails: Right Entrance Stairs-Number of Steps: 4 Home Layout: One level Home Equipment: Environmental consultant - 2 wheels;Cane - single point Additional Comments: daughter can assist as needed    Prior Function Level of Independence: Independent with assistive device(s)               Hand Dominance        Extremity/Trunk Assessment   Upper Extremity Assessment Upper Extremity Assessment: Defer to OT evaluation    Lower Extremity Assessment Lower Extremity Assessment: Generalized weakness       Communication   Communication: No difficulties  Cognition Arousal/Alertness: Awake/alert Behavior During Therapy: Flat affect Overall Cognitive Status: Within Functional Limits for tasks assessed  General Comments      Exercises General Exercises - Lower Extremity Ankle Circles/Pumps: AROM;Strengthening;Both;20 reps;Supine Quad Sets: AROM;Strengthening;Both;10 reps;Supine Short Arc Quad: AAROM;Strengthening;Both;10 reps Straight Leg Raises:  AAROM;Strengthening;Both;10 reps;Supine   Assessment/Plan    PT Assessment Patient needs continued PT services  PT Problem List Decreased strength;Decreased mobility;Decreased safety awareness;Decreased activity tolerance;Decreased balance;Decreased knowledge of use of DME       PT Treatment Interventions DME instruction;Therapeutic activities;Gait training;Therapeutic exercise;Patient/family education;Stair training;Balance training;Functional mobility training;Neuromuscular re-education    PT Goals (Current goals can be found in the Care Plan section)  Acute Rehab PT Goals Patient Stated Goal: To get stronger and go home with daughter. PT Goal Formulation: With patient Time For Goal Achievement: 05/18/19 Potential to Achieve Goals: Good    Frequency Min 3X/week   Barriers to discharge        Co-evaluation               AM-PAC PT "6 Clicks" Mobility  Outcome Measure Help needed turning from your back to your side while in a flat bed without using bedrails?: A Lot Help needed moving from lying on your back to sitting on the side of a flat bed without using bedrails?: A Lot Help needed moving to and from a bed to a chair (including a wheelchair)?: A Lot Help needed standing up from a chair using your arms (e.g., wheelchair or bedside chair)?: A Lot Help needed to walk in hospital room?: A Lot Help needed climbing 3-5 steps with a railing? : Total 6 Click Score: 11    End of Session Equipment Utilized During Treatment: Gait belt Activity Tolerance: Patient limited by fatigue Patient left: in chair;with call bell/phone within reach;with nursing/sitter in room Nurse Communication: Mobility status PT Visit Diagnosis: Muscle weakness (generalized) (M62.81);Other abnormalities of gait and mobility (R26.89)    Time: 5732-2025 PT Time Calculation (min) (ACUTE ONLY): 48 min   Charges:   PT Evaluation $PT Eval Moderate Complexity: 1 Mod PT Treatments $Therapeutic  Exercise: 8-22 mins        Theodoro Grist, PT  Lelon Mast 05/04/2019, 10:41 AM

## 2019-05-04 NOTE — Progress Notes (Signed)
Initial Nutrition Assessment  RD working remotely.   DOCUMENTATION CODES:   Not applicable  INTERVENTION:  - will increase Ensure Enlive TID, each supplement provides 350 kcal and 20 grams of protein. - will order daily multivitamin with minerals. - continue to encourage PO intakes. - will monitor for additional nutrition-related needs.    NUTRITION DIAGNOSIS:   Increased nutrient needs related to chronic illness, cancer and cancer related treatments as evidenced by estimated needs.  GOAL:   Patient will meet greater than or equal to 90% of their needs  MONITOR:   PO intake, Supplement acceptance, Labs, Weight trends, I & O's  REASON FOR ASSESSMENT:   Malnutrition Screening Tool  ASSESSMENT:   78 y.o. 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 She was recently hospitalized 6/7-6/13 for BLE DVTs, near syncopal episode 2/2 increased ostomy output. She presented to the ED with significant weakness to the point she was unable to stand, continued increased ostomy output.  [place] had attempted to call patient x2 at the beginning of the month without success. Patient was seen remotely by another RD on 6/8. Patient reports that she has had a decreased appetite for the past ~2 weeks 2/2 ongoing high output from ostomy. She likes Ensure supplements and was able to drink most of the ones provided during hospitalization last week and is interested in continuing to receive this supplement. Will increase to TID to aid in nutrition and fluid needs.   Will monitor for a better time to talk with patient concerning foods to help  thicken stools. Continue to encourage adequate fluid intake. IV fluids started this AM.   Per chart review, current weight is 137 lb. It appears that weight has fluctuated over the past 1 month. On 5/11 she weighed 135 lb and got down to 121 lb on 6/1. Weight now trending back up. Suspect at least part of weight loss 2/2 fluid losses from ongoing high output. Will need to complete NFPE when possible before being able to identify malnutrition. Will order multivitamin in addition to Ensure supplements but will monitor labs and if multivitamin can be/needs to be stopped.   Surgery following patient d/t high output ostomy and note indicates 2L output since admission. Notes state this is thought to be 2/2 previous obstruction and chemo side effects and that it will gradually improve over time. Fibercon added BID by Surgeon today. Surgery signed off as of today; no surgical interventions warranted at this time.    Medications reviewed; 2 tablets lomotil QID, 325 mg ferrous sulfate/day, 150 mcg oral synthroid/day, 2 mg imodium QID, 1250 mg fibercon BID, 20 mEq oral K-Dur once/day, 10 mg deltasone/day, 1000 mcg oral cyanocobalamin/day.  Labs reviewed; CBGs: 45, 56, and 114 mg/dl today, Ca: 7.1 mg/dl.  IVF; D5-NS @ 125 ml/hr (510 kcal).     NUTRITION - FOCUSED PHYSICAL EXAM:  unable to complete at this time.   Diet Order:   Diet Order            Diet regular Room service appropriate? Yes; Fluid consistency: Thin  Diet effective now              EDUCATION NEEDS:   No education needs have been  identified at this time  Skin:  Skin Assessment: Reviewed RN Assessment  Last BM:  6/17 (1425 ml via colostomy)  Height:   Ht Readings from Last 1 Encounters:  05/03/19 5\' 7"  (1.702 m)    Weight:   Wt Readings from Last 1 Encounters:  05/03/19 62 kg    Ideal Body Weight:  61.4 kg  BMI:  Body mass index is 21.41 kg/m.  Estimated Nutritional Needs:   Kcal:  1860-2050 kcal  Protein:   85-100 grams  Fluid:  >/= 2.5 L/day      Jarome Matin, MS, RD, LDN, [facility] - Eat Inpatient Clinical Dietitian Pager # [phone] After hours/weekend pager # [phone]

## 2019-05-04 NOTE — TOC Progression Note (Signed)
Transition of Care [facility]) - Progression Note    Patient Details  Name: Nichole Cross MRN: [id number] Date of Birth: Mar 21, 1941  Transition of Care [facility]) CM/SW Contact  Lam Mccubbins, Juliann Pulse, RN Phone Number: 05/04/2019, 3:59 PM  Clinical Narrative: Spoke to dtr-Ann -she also declines SNF- aware & agree to d/c plan home w/HHC-[facility] chosen & following-HHPT/OT/aide.      Expected Discharge Plan: [place] Barriers to Discharge: Continued Medical Work up  Expected Discharge Plan and Services Expected Discharge Plan: [place]   Discharge Planning Services: CM Consult Post Acute Care Choice: [place] arrangements for the past 2 months: Single Family Home                                       Social Determinants of Health (SDOH) Interventions    Readmission Risk Interventions Readmission Risk Prevention Plan 04/27/2019  Transportation Screening Complete  Medication Review Press photographer) Complete  HRI or [place] Complete  SW Recovery Care/Counseling Consult Complete  Palliative Care Screening Not [place] Not Applicable  Some recent data might be hidden

## 2019-05-04 NOTE — Progress Notes (Addendum)
IP PROGRESS NOTE  Subjective:   Patient was readmitted with weakness and FTT. She is again dehydrated with a large amount of liquid stool from her colostomy. The family stated that her urine was black. ER noted urine was dark brown. She had some confusion in the ER and had a CT of the head which did not show any acute abnormality. When seen today, the patient reports ongoing weakness. Nursing reported concerns that she was not voiding. Bladder scan negative. Was up to the [facility] at the time of my visit. BSC had a moderate amount of stool and possibly urine mixed in. Denies abdominal pain, nausea, vomiting. Still having a large amount of dark liquid from her colostomy. She has no other complaints this morning.   Objective: Vital signs in last 24 hours: Blood pressure 110/73, pulse 69, temperature 98 F (36.7 C), temperature source Oral, resp. rate 16, height _0  (1.702 m), weight 136 lb 11 oz (62 kg), SpO2 97 %.  Intake/Output from previous day: 06/16 0701 - 06/17 0700 In: 3887.7 [P.O.:360; I.V.:1477.7; IV Piggyback:2050] Out: 2325 [Urine:150; Stool:2175]  Physical Exam:  HEENT: No thrush Abdomen: Nontender, no hepatomegaly, formed and liquid brown stool in the ostomy bag Extremities: 1+ edema at the left lower leg; trace edema to the right lower extremity  Portacath/PICC-without erythema  Lab Results: Recent Labs    05/03/19 1034 05/04/19 0625  WBC 7.8 5.1  HGB 9.7* 9.0*  HCT 32.1* 29.0*  PLT 324 242    BMET Recent Labs    05/03/19 1034 05/04/19 0625  NA 133* 137  K 4.3 3.8  CL 98 105  CO2 22 22  GLUCOSE 76 64*  BUN 24* 22  CREATININE 0.62 0.52  CALCIUM 7.6* 7.1*    Lab Results  Component Value Date   CEA1 178.58 (H) 03/28/2019     Medications: I have reviewed the patient's current medications.  Assessment/Plan: 1.Adenocarcinoma the distal sigmoid colon, poorly differentiated, stage II (T3 N0), status post a laparoscopic assisted sigmoid colectomy  08/09/2015  No loss of mismatch repair protein expression  Elevated preoperative CEA  Staging CT of the abdomen and pelvis 06/22/2015 with no evidence of distant metastatic disease  Mildly elevated CEA 02/23/2017And 04/17/2016  CTs chest, abdomen, and pelvis on 06/04/2016, 106m left upper lobe nodule-no comparison available, new hypodense lesion in the left liver, solid Right renal mass  MRI 06/10/2016-2 enhancing lesions in the liver consistent with metastatic disease, segment 2 and segment 5. Solid enhancing right renal lesion consistent with a renal cell carcinoma  Radiofrequency ablation of 2 liver lesions on 07/25/2016  Cycle 1 adjuvant Xeloda 08/18/2016  Cycle 2 Xeloda 09/08/2016  CEA improved 09/26/2016  Cycle 3 Xeloda held 09/29/2016 due to unexplained abdominal pain,referred for CT  CT abdomen/pelvis 09/30/2016 with a long segment of bowel wall thickening involving the distal ileum; lesion left hepatic lobe similar to slightly smaller following ablation; lesion posterior right hepatic lobe elongated favored to represent treatment tract.  Cycle 3 Xeloda 10/10/2016 (dose reduced due to drug induced enteritis following cycle 2)discontinued 10/15/2016 secondary to abdominal pain.  CT in while the emergency room with abdominal pain 10/15/2016 revealed descending colitis  Colonoscopy 10/30/2016 confirmed a mass at the: colo-colonicanastomosis  Cycle 4 Xeloda 11/03/2016  01/09/2017 status post a low anterior resection for removal of locally recurrent tumor at the sigmoid anastomosis, resection margins negative, tumor invades pericolonic soft tissue, 12 benign lymph nodes;MSI stable. Foundation 1 testing- KRAS G13D, MSS, tumor mutation burden-3,BRAFwild-type  Cycle  5 Xeloda 02/12/2017  Cycle 6 Xeloda 03/12/2017, Xeloda dose reduced to 1000 mg twice daily on 03/18/2017  Cycle 7 Xeloda 04/09/2017  Cycle 8 Xeloda 05/12/2017  Restaging CTs 06/24/2017-no evidence of  metastatic disease involving the chest. Post ablation changes in the liver. No new hepatic metastatic disease.  Restaging CTs 12/14/2017- stable liver ablation sites, indeterminate nodule adjacent to the descending colon, stable left renal mass, changes of early cirrhosis, enlarged porta hepatis node-likely reactive  MRI abdomen 04/22/2018- enlargement of 1.7 cm left paracolic gutter mass, stable 1 cm extrahepatic left liver capsule lesion, no evidence of local tumor recurrence at the ablation sites, no new liver lesions, stable right kidney mass  Colonoscopy 05/19/2018- mass in the rectum at the anastomotic site beginning at 3 cm from the anal verge, biopsy confirmed invasive adenocarcinoma  CT 06/07/2018-stable liver ablation sites, enlarging peritoneal lesion at the left pericolic gutter, stable nodule in the sigmoid mesocolon, stable prominent periportal lymph nodes, no evidence of metastatic disease to the chest  Radiation/Xeloda beginning 06/21/2018  Xeloda dose reduced to 1000 mg twice daily days of radiationbeginning8/16/2019 due to neutropenia  Radiation/Xeloda completed 08/02/2018  CTs 10/07/2018- new 6 mm right upper lobe nodule, stable ablation changes in the left and right liver, stable superior pole right renal mass, increased soft tissue thickening the presacral region, stable 12 mm porta hepatis node, increase in size of soft tissue nodule adjacent to the descending colon-2.1 x 2.2 cm  CTs 01/11/2019- anterior right upper lobe subpleural nodule-smaller, no suspicious lung nodules, stable ablation defects in the liver, stable right kidney mass, wall thickening at the suture line in the lower pelvis with extension to the presacral region/vaginal fornix, slight enlargement of left pericolic gutter peritoneal implant  Sigmoidoscopy 01/27/2019- mass at 5 cm from the anal verge, biopsy confirmed adenocarcinoma  Laparoscopic transverse colostomy and Port-A-Cath placement 03/02/2019- tumor noted  to be studding the descending colon  Cycle 1 FOLFOX 04/04/2019    2.History ofMicrocytic anemia-likely iron deficiency anemia secondary to colon cancer, persistent microcytic anemia;ferrous sulfate increased to twice daily 12/15/2017;hemoglobin in normal range 03/16/2018  3.Tubular villous adenoma on the sigmoid colon resection specimen 08/09/2015  Multiple tubular adenomas removed on a colonoscopy 11/16/2015  4. Right renal mass on CT 06/04/2016-suspicious for renal cell carcinoma;stable on CT 06/24/2017;referred to urology  Stable on CT 01/11/2019  5. History ofneutropenia secondary to chemotherapy  6.Pain secondary to local recurrence of colon cancer-improved  7.Abdominal pain. CT abdomen/pelvis 07/24/2018- inflammatory changes and mild dilatation of multiple small bowel loops in the lower abdomen and pelvis. Unchanged metastatic implant in the left paracolic gutter; unchanged periportal lymphadenopathy; hepatic steatosis; stable liver ablation site; unchanged 2.7 cm enhancing right renal mass.Resolved.  8.Severe macrocytic anemia 03/28/2019- vitamin B12 level at the low end of normal, hypersegmented neutrophils noted on peripheral blood smear, DAT positive, elevated LDH and bilirubin  Prednisone, 60 mg daily started 03/29/2019  1 unit of packed red blood cells 03/28/2019  Hemoglobin improved 03/30/2019, stable 03/31/2019  B12 initiated 03/30/2019  Hemoglobin improved   9.  Admission 04/24/2019 with presyncope 10.  Urinary tract infection 04/24/2019 11.  Bilateral lower extremity DVTs on Doppler 04/25/2019- right peroneal, left posterior tibial and left peroneal  Lovenox started  Nichole Cross continues to have a significant volume of loose stool output from the ostomy despite being on Lomotil, Imodium, Questran, and octreotide.  There has been some concern regarding her urine being dark brown or black and nurse reports lack of urine output overnight with  negative bladder scan.  She was noted to have a moderate amount of stool per rectum which may have been mixed with urine this morning.  Question possible fistula.  She remains anemic which is stable.  Recommendations: 1.  Continue IV hydration and monitor fluid balance 2.  Monitor input/output including stool output 3.  Continue Lomotil, Imodium, Questran, octreotide. 4.  We will ask Dr. Marcello Moores of GI surgery to see her to evaluate for possible fistula. 5.  Continue prednisone at current dose, will taper as an outpatient  Updated daughter, Nichole Cross, by telephone.    LOS: 0 days   Mikey Bussing, NP   05/04/2019, 11:37 AM  Nichole Cross was interviewed and examined.  She is again admitted with dehydration.  She has been taking Imodium, Lomotil, and Metamucil at home.  I prescribed tincture of opium when she was seen at the Cancer center on 05/02/2019.  It is not clear whether she has been taking the opium.  Her family reported black urine.  It is possible she has developed a fistula to the bladder.  We can monitor for this.  Chemotherapy has been placed on hold until the stool output improves.  She completed only 1 cycle of FOLFOX and has experienced improvement in rectal pain.

## 2019-05-04 NOTE — Progress Notes (Signed)
Patient's am CBG 45. OJ with sugar given. IV fluid orders received and changed. Recheck CBG was 46. Dextrose IV given. Pt alert and oriented. Will recheck CBG and continue to monitor. Eulas Post, RN

## 2019-05-04 NOTE — Care Management Obs Status (Signed)
[place] NOTIFICATION   Patient Details  Name: Nichole Cross MRN: [id number] Date of Birth: 05-Jul-1941   Medicare Observation Status Notification Given:  Yes    [name], RN 05/04/2019, 3:24 PM

## 2019-05-04 NOTE — Progress Notes (Signed)
Pt had not urinated all night. Pt stated she did not have the urge to go and she did not feel distended upon palpation. Pt was also dry underneath her. This RN bladder scanned pt and had another RN verify. Bladder scan volume was 0 mL. Pt had been drinking and had fluids running. Purewick, tubing, and canister all changed and repositioned. Will continue to monitor the pt.

## 2019-05-04 NOTE — TOC Progression Note (Signed)
Transition of Care [facility]) - Progression Note    Patient Details  Name: Nichole Cross MRN: [id number] Date of Birth: 1941-02-05  Transition of Care [facility]) CM/SW Contact  Kester Stimpson, Juliann Pulse, RN Phone Number: 05/04/2019, 3:54 PM  Clinical Narrative:   [place] [phone]  [place] my Favorites Quality of Patient Care Rating 4  out of 5 stars Patient Survey Summary Rating 3 out of 5 stars [place] [phone]  [place] my Favorites Quality of Patient Care Rating 4  out of 5 stars Patient Survey Summary Rating 4 out of [place] [phone]  [place] my Favorites Quality of Patient Care Rating 4  out of 5 stars Patient Survey Summary Rating 3 out of 5 stars [place] [phone]  [place] my Favorites Quality of Patient Care Rating 4 out of 5 stars Patient Survey Summary Rating 4 out of 5 stars [place] [phone]  [place] my Favorites Quality of Patient Care Rating 4 out of 5 stars Patient Survey Summary Rating 4 out of [place] [phone]  [place] my Favorites Quality of Patient Care Rating 3  out of 5 stars Patient Survey Summary Rating 4 out of 5 stars [facility] [place] [phone]  Add [facility] [place] my Favorites Quality of Patient Care Rating 3  out of 5 stars Patient Survey Summary Rating 4 out of [place] [phone]  [place] my Favorites Quality of Patient Care Rating 3  out of 5 stars Patient Survey Summary Rating 4 out of 5 stars [place] [phone]  [place] my Favorites Quality of Patient Care Rating 3  out of 5 stars Patient Survey Summary  Rating 3 out of 5 stars [facility] [phone]  Add [facility] my Favorites Quality of Patient Care Rating 3  out of 5 stars Patient Survey Summary Rating 3 out of [place] [phone]  [place] my Favorites Quality of Patient Care Rating 3 out of 5 stars Patient Survey Summary Rating 4 out of 5 stars [place] [phone]     Expected Discharge Plan: [place] Barriers to Discharge: Continued Medical Work up  Expected Discharge Plan and Services Expected Discharge Plan: [place]   Discharge Planning Services: CM Consult Post Acute Care Choice: [place] arrangements for the past 2 months: Single Family Home                                       Social Determinants of Health (SDOH) Interventions    Readmission Risk Interventions Readmission Risk Prevention Plan 04/27/2019  Transportation Screening Complete  Medication Review Press photographer) Complete  HRI or [place] Complete  SW Recovery Care/Counseling Consult Complete  [place] Not Applicable  Some recent data might be hidden

## 2019-05-04 NOTE — Progress Notes (Signed)
PROGRESS NOTE    Nichole Cross  OEH:[id number] DOB: 11-Mar-1941 DOA: 05/03/2019 PCP: [name], MD    Brief Narrative ; 78 year old with past medical history significant for adenocarcinoma of the distal sigmoid colon poorly differentiated stage II a status post laparoscopic-assisted sigmoid colectomy on 9/26 12/2014 with ongoing chemotherapy, of the anal verge status post laparoscopic transverse colostomy and a Port-A-Cath placement on 4/15 2020 tumor to be yesterday in the descending colon is status post 1 cycle FOLFOX on 04/04/2019, history of renal cell mass suspicious for renal cell carcinoma stable, chronic pain secondary to local recurrence of colon cancer, history of volume depletion secondary to increased ostomy output, recently hospitalized from 04/23/2020 to 04/30/2019 for bilateral lower extremity DVTs, near syncope and increase output from ostomy who was discharged on Imodium, Metamucil, Lomotil.  Patient follow-up with oncology on 6/15 she was given IV fluids and a tincture of opium.  She presents to the ED with significant weakness to the point that she is not able to stand, continue to have increased out food from ostomy.  Patient family also noticed that her urine was black and dark.  Evaluation in the ED urine analysis consistent with UTI, creatinine at 2.2, hemoglobin 9.7.  [place] negative.  Assessment & Plan:   Principal Problem:   Weakness Active Problems:   Hypothyroidism   Cystitis   Sigmoid colon cancer s/p lap assisted sigmoid colectomy 08/09/15   Anemia, iron deficiency   Adenocarcinoma of colon metastatic to liver (HCC)   Dehydration   Acute lower UTI   Severe dehydration   Leg DVT (deep venous thromboembolism), acute, bilateral (HCC)  1-generalized weakness likely related to failure to thrive related to increased ostomy output: Continue with IV fluids. PT evaluation.  2-increase ostomy output: Likely secondary to short gut syndrome. Continue with Imodium,  lomotil Continue with Questran and octreotide subcu. General surgery consulted. Started on fibercorn by general surgery.   3-UTI: Continue with ceftriaxone.  Follow urine culture.  4-hypothyroidism: Continue with Synthroid.  Acute bilateral lower extremity DVT: Diagnosis 6/8. Continue with Lovenox   Dehydration: Continue with IV fluids.  Renal mass: Under surveillance Adenocarcinoma of the colon with mets to the liver: Patient undergoing chemotherapy status post transverse colostomy and Port-A-Cath placement 03/07/2019, started chemotherapy FOLFOX cycle 1 on 04/04/2019.   Oncology consulted.  Hypoglycemia: Change IV fluid to D5 normal saline. Estimated body mass index is 21.41 kg/m as calculated from the following:   Height as of this encounter: 5\' 7"  (1.702 m).   Weight as of this encounter: 62 kg.   DVT prophylaxis: On Lovenox Code Status: DNR Family Communication: Will update daughter Disposition Plan: Remain in the hospital for IV fluids and treatment of increased output from ostomy, patient is not able to keep up with ostomy losses  Consultants:   Oncology  General surgery   Procedures:   None   Antimicrobials:   Ceftriaxone   Subjective: Patient is alert, she denies abdominal pain.  Denies dysuria.  She is still having a lot of watery output from colostomy.  Objective: Vitals:   05/03/19 1753 05/03/19 2112 05/03/19 2117 05/04/19 0445  BP: 113/67 123/76  110/73  Pulse: 74 (!) 58  69  Resp: 16 16  16   Temp: 98.1 F (36.7 C) 97.7 F (36.5 C)  98 F (36.7 C)  TempSrc: Oral Oral  Oral  SpO2: 100% 95%  97%  Weight: 62.2 kg  62 kg   Height: 5\' 7"  (1.702 m)  Intake/Output Summary (Last 24 hours) at 05/04/2019 0854 Last data filed at 05/04/2019 0746 Gross per 24 hour  Intake 3887.68 ml  Output 2825 ml  Net 1062.68 ml   Filed Weights   05/03/19 1000 05/03/19 1753 05/03/19 2117  Weight: 60.8 kg 62.2 kg 62 kg    Examination:  General  exam: Appears calm and comfortable  Respiratory system: Clear to auscultation. Respiratory effort normal. Cardiovascular system: S1 & S2 heard, RRR. No JVD, murmurs, rubs, gallops or clicks. No pedal edema. Gastrointestinal system: Abdomen is nondistended, soft and nontender. No organomegaly or masses felt. Normal bowel sounds heard. Central nervous system: Alert and oriented. No focal neurological deficits. Extremities: Symmetric 5 x 5 power. Skin: No rashes, lesions or ulcers Psychiatry: Judgement and insight appear normal. Mood & affect appropriate.     Data Reviewed: I have personally reviewed following labs and imaging studies  CBC: Recent Labs  Lab 04/30/19 0411 05/02/19 1158 05/03/19 1034 05/04/19 0625  WBC 10.3 10.2 7.8 5.1  NEUTROABS  --  8.5* 6.4  --   HGB 8.7* 10.9* 9.7* 9.0*  HCT 28.3* 34.5* 32.1* 29.0*  MCV 101.8* 99.4 101.3* 103.2*  PLT 195 380 324 595   Basic Metabolic Panel: Recent Labs  Lab 04/28/19 0510 04/29/19 0450 04/30/19 0411 05/02/19 1158 05/03/19 1034 05/03/19 2049 05/04/19 0625  NA 132* 130* 133* 131* 133*  --  137  K 3.9 4.4 4.0 5.0 4.3  --  3.8  CL 106 105 100 96* 98  --  105  CO2 16* 18* 23 24 22   --  22  GLUCOSE 83 76 94 94 76  --  64*  BUN 23 25* 21 19 24*  --  22  CREATININE 0.72 0.66 0.59 0.72 0.62  --  0.52  CALCIUM 7.2* 7.7* 7.6* 7.8* 7.6*  --  7.1*  MG 2.3  --   --  1.7 1.9 1.9  --    GFR: Estimated Creatinine Clearance: 59.1 mL/min (by C-G formula based on SCr of 0.52 mg/dL). Liver Function Tests: Recent Labs  Lab 05/02/19 1158 05/03/19 1034 05/04/19 0625  AST 30 25 19   ALT 18 18 14   ALKPHOS 529* 466* 404*  BILITOT 0.3 0.5 0.4  PROT 5.3* 4.8* 4.3*  ALBUMIN 2.0* 2.1* 1.7*   Recent Labs  Lab 05/03/19 1034  LIPASE 18   Recent Labs  Lab 05/03/19 1032  AMMONIA 68*   Coagulation Profile: No results for input(s): INR, PROTIME in the last 168 hours. Cardiac Enzymes: No results for input(s): CKTOTAL, CKMB,  CKMBINDEX, TROPONINI in the last 168 hours. BNP (last 3 results) No results for input(s): PROBNP in the last 8760 hours. HbA1C: No results for input(s): HGBA1C in the last 72 hours. CBG: Recent Labs  Lab 05/04/19 0730 05/04/19 0805 05/04/19 0850  GLUCAP 45* 46* 114*   Lipid Profile: No results for input(s): CHOL, HDL, LDLCALC, TRIG, CHOLHDL, LDLDIRECT in the last 72 hours. Thyroid Function Tests: No results for input(s): TSH, T4TOTAL, FREET4, T3FREE, THYROIDAB in the last 72 hours. Anemia Panel: No results for input(s): VITAMINB12, FOLATE, FERRITIN, TIBC, IRON, RETICCTPCT in the last 72 hours. Sepsis Labs: No results for input(s): PROCALCITON, LATICACIDVEN in the last 168 hours.  Recent Results (from the past 240 hour(s))  SARS Coronavirus 2 (CEPHEID - Performed in [facility] hospital lab), Hosp Order     Status: None   Collection Time: 04/24/19  9:59 AM   Specimen: Nasopharyngeal Swab  Result Value Ref Range Status  SARS Coronavirus 2 NEGATIVE NEGATIVE Final    Comment: (NOTE) If result is NEGATIVE SARS-CoV-2 target nucleic acids are NOT DETECTED. The SARS-CoV-2 RNA is generally detectable in upper and lower  respiratory specimens during the acute phase of infection. The lowest  concentration of SARS-CoV-2 viral copies this assay can detect is 250  copies / mL. A negative result does not preclude SARS-CoV-2 infection  and should not be used as the sole basis for treatment or other  patient management decisions.  A negative result may occur with  improper specimen collection / handling, submission of specimen other  than nasopharyngeal swab, presence of viral mutation(s) within the  areas targeted by this assay, and inadequate number of viral copies  (<250 copies / mL). A negative result must be combined with clinical  observations, patient history, and epidemiological information. If result is POSITIVE SARS-CoV-2 target nucleic acids are DETECTED. The SARS-CoV-2 RNA is  generally detectable in upper and lower  respiratory specimens dur ing the acute phase of infection.  Positive  results are indicative of active infection with SARS-CoV-2.  Clinical  correlation with patient history and other diagnostic information is  necessary to determine patient infection status.  Positive results do  not rule out bacterial infection or co-infection with other viruses. If result is PRESUMPTIVE POSTIVE SARS-CoV-2 nucleic acids MAY BE PRESENT.   A presumptive positive result was obtained on the submitted specimen  and confirmed on repeat testing.  While 2019 novel coronavirus  (SARS-CoV-2) nucleic acids may be present in the submitted sample  additional confirmatory testing may be necessary for epidemiological  and / or clinical management purposes  to differentiate between  SARS-CoV-2 and other Sarbecovirus currently known to infect humans.  If clinically indicated additional testing with an alternate test  methodology [phone]) is advised. The SARS-CoV-2 RNA is generally  detectable in upper and lower respiratory sp ecimens during the acute  phase of infection. The expected result is Negative. Fact Sheet for Patients:  StrictlyIdeas.no Fact Sheet for Healthcare Providers: BankingDealers.co.za This test is not yet approved or cleared by the [place] FDA and has been authorized for detection and/or diagnosis of SARS-CoV-2 by FDA under an Emergency Use Authorization (EUA).  This EUA will remain in effect (meaning this test can be used) for the duration of the COVID-19 declaration under Section 564(b)(1) of the Act, 21 U.S.C. section 360bbb-3(b)(1), unless the authorization is terminated or revoked sooner. Performed at [facility], [place] [address], [place], [place] [address]   Urine culture     Status: None   Collection Time: 04/24/19 11:51 AM   Specimen: Urine, Clean Catch  Result Value Ref  Range Status   Specimen Description   Final    URINE, CLEAN CATCH Performed at [facility], [place] [address], [place], [place] [address]    Special Requests   Final    NONE Performed at [facility], [place] [address] [place], [place] [address]    Culture   Final    Multiple bacterial morphotypes present, none predominant. Suggest appropriate recollection if clinically indicated.   Report Status 04/26/2019 FINAL  Final  SARS Coronavirus 2 (CEPHEID - Performed in [place] hospital lab), Hosp Order     Status: None   Collection Time: 05/03/19  4:31 PM   Specimen: Nasopharyngeal Swab  Result Value Ref Range Status   SARS Coronavirus 2 NEGATIVE NEGATIVE Final    Comment: (NOTE) If result is NEGATIVE SARS-CoV-2 target nucleic  acids are NOT DETECTED. The SARS-CoV-2 RNA is generally detectable in upper and lower  respiratory specimens during the acute phase of infection. The lowest  concentration of SARS-CoV-2 viral copies this assay can detect is 250  copies / mL. A negative result does not preclude SARS-CoV-2 infection  and should not be used as the sole basis for treatment or other  patient management decisions.  A negative result may occur with  improper specimen collection / handling, submission of specimen other  than nasopharyngeal swab, presence of viral mutation(s) within the  areas targeted by this assay, and inadequate number of viral copies  (<250 copies / mL). A negative result must be combined with clinical  observations, patient history, and epidemiological information. If result is POSITIVE SARS-CoV-2 target nucleic acids are DETECTED. The SARS-CoV-2 RNA is generally detectable in upper and lower  respiratory specimens dur ing the acute phase of infection.  Positive  results are indicative of active infection with SARS-CoV-2.  Clinical  correlation with patient history and other diagnostic information is  necessary to determine  patient infection status.  Positive results do  not rule out bacterial infection or co-infection with other viruses. If result is PRESUMPTIVE POSTIVE SARS-CoV-2 nucleic acids MAY BE PRESENT.   A presumptive positive result was obtained on the submitted specimen  and confirmed on repeat testing.  While 2019 novel coronavirus  (SARS-CoV-2) nucleic acids may be present in the submitted sample  additional confirmatory testing may be necessary for epidemiological  and / or clinical management purposes  to differentiate between  SARS-CoV-2 and other Sarbecovirus currently known to infect humans.  If clinically indicated additional testing with an alternate test  methodology [phone]) is advised. The SARS-CoV-2 RNA is generally  detectable in upper and lower respiratory sp ecimens during the acute  phase of infection. The expected result is Negative. Fact Sheet for Patients:  StrictlyIdeas.no Fact Sheet for Healthcare Providers: BankingDealers.co.za This test is not yet approved or cleared by the [place] FDA and has been authorized for detection and/or diagnosis of SARS-CoV-2 by FDA under an Emergency Use Authorization (EUA).  This EUA will remain in effect (meaning this test can be used) for the duration of the COVID-19 declaration under Section 564(b)(1) of the Act, 21 U.S.C. section 360bbb-3(b)(1), unless the authorization is terminated or revoked sooner. Performed at [facility], [place] [address], [place], [place] [address]          Radiology Studies: Ct Head Wo Contrast  Result Date: 05/03/2019 CLINICAL DATA:  Weakness. The patient has been unable to eat for the past 24 hours. EXAM: CT HEAD WITHOUT CONTRAST TECHNIQUE: Contiguous axial images were obtained from the base of the skull through the vertex without intravenous contrast. COMPARISON:  Head CT scan 12/28/2016. FINDINGS: Brain: No evidence of acute  infarction, hemorrhage, hydrocephalus, extra-axial collection or mass lesion/mass effect. Cortical atrophy noted. Vascular: No hyperdense vessel or unexpected calcification. Skull: Intact.  No focal lesion. Sinuses/Orbits: Negative. Other: None. IMPRESSION: No acute abnormality. Cortical atrophy. Electronically Signed   By: Inge Rise M.D.   On: 05/03/2019 11:59        Scheduled Meds: . cholestyramine  4 g Oral BID  . diphenoxylate-atropine  2 tablet Oral QID  . enoxaparin  90 mg Subcutaneous Q24H  . feeding supplement (ENSURE ENLIVE)  237 mL Oral BID BM  . ferrous sulfate  325 mg Oral Daily  . levothyroxine  150 mcg Oral Daily  . loperamide  2 mg Oral  QID  . octreotide  50 mcg Subcutaneous Q12H  . polycarbophil  1,250 mg Oral BID  . potassium chloride SA  20 mEq Oral Daily  . predniSONE  10 mg Oral Q breakfast  . sodium chloride flush  10-40 mL Intracatheter Q12H  . temazepam  15 mg Oral QHS  . vitamin B-12  1,000 mcg Oral Daily   Continuous Infusions: . cefTRIAXone (ROCEPHIN)  IV    . dextrose 5 % and 0.9% NaCl 125 mL/hr at 05/04/19 0737     LOS: 0 days    Time spent: 35 minutes    Elmarie Shiley, MD [facility] Pager [phone]  If 7PM-7AM, please contact night-coverage [email or web address] Password [facility] 05/04/2019, 8:54 AM

## 2019-05-05 DIAGNOSIS — D589 Hereditary hemolytic anemia, unspecified: Secondary | ICD-10-CM

## 2019-05-05 DIAGNOSIS — D6481 Anemia due to antineoplastic chemotherapy: Secondary | ICD-10-CM

## 2019-05-05 DIAGNOSIS — D630 Anemia in neoplastic disease: Secondary | ICD-10-CM

## 2019-05-05 LAB — GLUCOSE, CAPILLARY
Glucose-Capillary: 122 mg/dL — ABNORMAL HIGH (ref 70–99)
Glucose-Capillary: 122 mg/dL — ABNORMAL HIGH (ref 70–99)
Glucose-Capillary: 130 mg/dL — ABNORMAL HIGH (ref 70–99)
Glucose-Capillary: 131 mg/dL — ABNORMAL HIGH (ref 70–99)
Glucose-Capillary: 138 mg/dL — ABNORMAL HIGH (ref 70–99)
Glucose-Capillary: 181 mg/dL — ABNORMAL HIGH (ref 70–99)

## 2019-05-05 LAB — BASIC METABOLIC PANEL
Anion gap: 8 (ref 5–15)
BUN: 19 mg/dL (ref 8–23)
CO2: 19 mmol/L — ABNORMAL LOW (ref 22–32)
Calcium: 7.2 mg/dL — ABNORMAL LOW (ref 8.9–10.3)
Chloride: 109 mmol/L (ref 98–111)
Creatinine, Ser: 0.49 mg/dL (ref 0.44–1.00)
GFR calc Af Amer: >60 mL/min (ref >60)
GFR calc non Af Amer: >60 mL/min (ref >60)
Glucose, Bld: 135 mg/dL — ABNORMAL HIGH (ref 70–99)
Potassium: 3.7 mmol/L (ref 3.5–5.1)
Sodium: 136 mmol/L (ref 135–145)

## 2019-05-05 LAB — URINALYSIS, ROUTINE W REFLEX MICROSCOPIC
Bilirubin Urine: NEGATIVE
Glucose, UA: NEGATIVE mg/dL
Ketones, ur: NEGATIVE mg/dL
Nitrite: NEGATIVE
Protein, ur: NEGATIVE mg/dL
Specific Gravity, Urine: 1.009 (ref 1.005–1.030)
pH: 6 (ref 5.0–8.0)

## 2019-05-05 LAB — CBC
HCT: 29.6 % — ABNORMAL LOW (ref 36.0–46.0)
Hemoglobin: 8.7 g/dL — ABNORMAL LOW (ref 12.0–15.0)
MCH: 30.7 pg (ref 26.0–34.0)
MCHC: 29.4 g/dL — ABNORMAL LOW (ref 30.0–36.0)
MCV: 104.6 fL — ABNORMAL HIGH (ref 80.0–100.0)
Platelets: 259 10*3/uL (ref 150–400)
RBC: 2.83 MIL/uL — ABNORMAL LOW (ref 3.87–5.11)
RDW: 17.6 % — ABNORMAL HIGH (ref 11.5–15.5)
WBC: 5.4 10*3/uL (ref 4.0–10.5)
nRBC: 0 % (ref 0.0–0.2)

## 2019-05-05 LAB — URINE CULTURE: Culture: >=100000 — AB

## 2019-05-05 LAB — MAGNESIUM: Magnesium: 1.7 mg/dL (ref 1.7–2.4)

## 2019-05-05 MED ORDER — SODIUM CHLORIDE 0.9 % IV SOLN
2.0000 g | Freq: Two times a day (BID) | INTRAVENOUS | Status: AC
  Administered 2019-05-05 – 2019-05-07 (×6): 2 g via INTRAVENOUS
  Filled 2019-05-05 (×6): qty 2

## 2019-05-05 MED ORDER — POTASSIUM CHLORIDE CRYS ER 20 MEQ PO TBCR
20.0000 meq | EXTENDED_RELEASE_TABLET | Freq: Once | ORAL | Status: AC
  Administered 2019-05-05: 20 meq via ORAL
  Filled 2019-05-05: qty 1

## 2019-05-05 MED ORDER — OPIUM 10 MG/ML (1%) PO TINC
0.6000 mL | Freq: Four times a day (QID) | ORAL | Status: DC
  Administered 2019-05-05 – 2019-05-07 (×8): 6 mg via ORAL
  Filled 2019-05-05 (×8): qty 1

## 2019-05-05 MED ORDER — PREDNISONE 5 MG PO TABS
5.0000 mg | ORAL_TABLET | Freq: Every day | ORAL | Status: DC
  Administered 2019-05-06 – 2019-05-07 (×2): 5 mg via ORAL
  Filled 2019-05-05 (×3): qty 1

## 2019-05-05 MED ORDER — MAGNESIUM SULFATE 2 GM/50ML IV SOLN
2.0000 g | Freq: Once | INTRAVENOUS | Status: AC
  Administered 2019-05-05: 2 g via INTRAVENOUS
  Filled 2019-05-05: qty 50

## 2019-05-05 MED ORDER — POTASSIUM CHLORIDE CRYS ER 20 MEQ PO TBCR: 40.0000 meq | EXTENDED_RELEASE_TABLET | Freq: Once | ORAL | Status: DC

## 2019-05-05 MED ORDER — OCTREOTIDE ACETATE 50 MCG/ML IJ SOLN
50.0000 ug | Freq: Two times a day (BID) | INTRAMUSCULAR | Status: AC
  Administered 2019-05-05: 50 ug via SUBCUTANEOUS
  Filled 2019-05-05: qty 1

## 2019-05-05 NOTE — Progress Notes (Addendum)
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 a status post laparoscopic-assisted sigmoid colectomy on 9/26 12/2014 with ongoing chemotherapy, of the anal verge status post laparoscopic transverse colostomy and a Port-A-Cath placement on 4/15 2020 tumor to be yesterday in the descending colon is status post 1 cycle FOLFOX on 04/04/2019, history of renal cell mass suspicious for renal cell carcinoma stable, chronic pain secondary to local recurrence of colon cancer, history of volume depletion secondary to increased ostomy output, recently hospitalized from 04/23/2020 to 04/30/2019 for bilateral lower extremity DVTs, near syncope and increase output from ostomy who was discharged on Imodium, Metamucil, Lomotil.  Patient follow-up with oncology on 6/15 she was given IV fluids and a tincture of opium.  She presents to the ED with significant weakness to the point that she is not able to stand, continue to have increased out food from ostomy.  Patient family also noticed that her urine was black and dark.  Evaluation in the ED urine analysis consistent with UTI, creatinine at 2.2, hemoglobin 9.7.  [place] negative.  Assessment & Plan:   Principal Problem:   Weakness Active Problems:   Hypothyroidism   Cystitis   Sigmoid colon cancer s/p lap assisted sigmoid colectomy 08/09/15   Anemia, iron deficiency   Adenocarcinoma of colon metastatic to liver (HCC)   Dehydration   Acute lower UTI   Severe dehydration   Leg DVT (deep venous thromboembolism), acute, bilateral (HCC)  1-generalized weakness likely related to failure to thrive related to increased ostomy output: Continue with IV fluids. PT evaluation. Continue to be weak.   2-increase ostomy output: Likely secondary to short gut syndrome.  Continue with Imodium, lomotil Continue with Questran and octreotide subcu last dose tonight.  General surgery consulted and sign off.  Started on fibercorn by general surgery.  Discussed with Dr Benay Spice, plan to start opium today.  ostomy out put decreasing.  General surgery informed of urine out put content similar to colostomy out put reported by nurse. They will see patient.   3-UTI: Change ceftriaxone to cefepime, urine growing citrobacter.   4-Hypothyroidism: Continue with Synthroid.  Acute bilateral lower extremity DVT: Diagnosis 6/8. Continue with Lovenox   Dehydration: Continue with IV fluids.  Renal mass: Under surveillance Adenocarcinoma of the colon with mets to the liver: Patient undergoing chemotherapy status post transverse colostomy and Port-A-Cath placement 03/07/2019, started chemotherapy FOLFOX cycle 1 on 04/04/2019.   Oncology consulted.  Hypoglycemia: Change IV fluid to D5 normal saline. Estimated body mass index is 21.41 kg/m as calculated from the following:   Height as of this encounter: 5\' 7"  (1.702 m).   Weight as of this encounter: 62 kg.   DVT prophylaxis: On Lovenox Code Status: DNR Family Communication:  update daughter Disposition Plan: Remain in the hospital for IV fluids and treatment of increased output from ostomy, patient is not able to keep up with ostomy losses  Consultants:   Oncology  General surgery   Procedures:   None   Antimicrobials:   Ceftriaxone   Subjective: She was sleepy, wake up to voice. Per nurse patient didn't eat breakfast.  Patient denies abdominal pain, report poor appetite.   Objective: Vitals:   05/04/19 0445 05/04/19 1400 05/04/19 2052 05/05/19 0449  BP: 110/73 115/68 112/68 100/67  Pulse: 69 65 70 76  Resp: 16 17 18 18   Temp: 98 F (36.7 C) 98.1 F (36.7 C) 97.8 F (36.6 C) (!) 97.2 F (36.2 C)  TempSrc: Oral Oral Oral Oral  SpO2: 97% 98% 98% 97%  Weight:      Height:         Intake/Output Summary (Last 24 hours) at 05/05/2019 0913 Last data filed at 05/05/2019 0735 Gross per 24 hour  Intake 2961.63 ml  Output 1600 ml  Net 1361.63 ml   Filed Weights   05/03/19 1000 05/03/19 1753 05/03/19 2117  Weight: 60.8 kg 62.2 kg 62 kg    Examination:  General exam: NAD Respiratory system: CTA Cardiovascular system: S 1 S 2 RRR Gastrointestinal system: BS present, ostomy with bag in place.  Central nervous system:alert, non focal.  Extremities: symmetric power.  Skin: no rashes.    Data Reviewed: I have personally reviewed following labs and imaging studies  CBC: Recent Labs  Lab 04/30/19 0411 05/02/19 1158 05/03/19 1034 05/04/19 0625 05/05/19 0811  WBC 10.3 10.2 7.8 5.1 5.4  NEUTROABS  --  8.5* 6.4  --   --   HGB 8.7* 10.9* 9.7* 9.0* 8.7*  HCT 28.3* 34.5* 32.1* 29.0* 29.6*  MCV 101.8* 99.4 101.3* 103.2* 104.6*  PLT 195 380 324 242 466   Basic Metabolic Panel: Recent Labs  Lab 04/30/19 0411 05/02/19 1158 05/03/19 1034 05/03/19 2049 05/04/19 0625 05/05/19 0811  NA 133* 131* 133*  --  137 136  K 4.0 5.0 4.3  --  3.8 3.7  CL 100 96* 98  --  105 109  CO2 23 24 22   --  22 19*  GLUCOSE 94 94 76  --  64* 135*  BUN 21 19 24*  --  22 19  CREATININE 0.59 0.72 0.62  --  0.52 0.49  CALCIUM 7.6* 7.8* 7.6*  --  7.1* 7.2*  MG  --  1.7 1.9 1.9  --  1.7   GFR: Estimated Creatinine Clearance: 59.1 mL/min (by C-G formula based on SCr of 0.49 mg/dL). Liver Function Tests: Recent Labs  Lab 05/02/19 1158 05/03/19 1034 05/04/19 0625  AST 30 25 19   ALT 18 18 14   ALKPHOS 529* 466* 404*  BILITOT 0.3 0.5 0.4  PROT 5.3* 4.8* 4.3*  ALBUMIN 2.0* 2.1* 1.7*   Recent Labs  Lab 05/03/19 1034  LIPASE 18   Recent Labs  Lab 05/03/19 1032  AMMONIA 68*   Coagulation Profile: No results for input(s): INR, PROTIME in the last 168 hours. Cardiac Enzymes: No results for input(s): CKTOTAL, CKMB, CKMBINDEX, TROPONINI in the last 168 hours. BNP (last 3  results) No results for input(s): PROBNP in the last 8760 hours. HbA1C: No results for input(s): HGBA1C in the last 72 hours. CBG: Recent Labs  Lab 05/04/19 1626 05/04/19 2050 05/05/19 0035 05/05/19 0606 05/05/19 0759  GLUCAP 122* 170* 181* 122* 122*   Lipid Profile: No results for input(s): CHOL, HDL, LDLCALC, TRIG, CHOLHDL, LDLDIRECT in the last 72 hours. Thyroid Function Tests: No results for input(s): TSH, T4TOTAL, FREET4, T3FREE, THYROIDAB in the last 72 hours. Anemia Panel: No results for input(s): VITAMINB12, FOLATE, FERRITIN, TIBC, IRON, RETICCTPCT in the last 72 hours. Sepsis Labs: No results for input(s): PROCALCITON, LATICACIDVEN in the last 168 hours.  Recent Results (from the past 240 hour(s))  Urine culture     Status: Abnormal   Collection Time: 05/03/19  2:25 PM   Specimen: Urine, Random  Result Value Ref Range Status   Specimen Description  Final    URINE, RANDOM Performed at [facility], [place] [address], [place], [place] [address]    Special Requests   Final    NONE Performed at [facility], [place] [address], [place], [place] [address]    Culture >=100,000 COLONIES/mL CITROBACTER FREUNDII (A)  Final   Report Status 05/05/2019 FINAL  Final   Organism ID, Bacteria CITROBACTER FREUNDII (A)  Final      Susceptibility   Citrobacter freundii - MIC*    CEFAZOLIN >=64 RESISTANT Resistant     CEFTRIAXONE 4 SENSITIVE Sensitive     CIPROFLOXACIN >=4 RESISTANT Resistant     GENTAMICIN >=16 RESISTANT Resistant     IMIPENEM <=0.25 SENSITIVE Sensitive     NITROFURANTOIN <=16 SENSITIVE Sensitive     TRIMETH/SULFA >=320 RESISTANT Resistant     PIP/TAZO 16 SENSITIVE Sensitive     * >=100,000 COLONIES/mL CITROBACTER FREUNDII  SARS Coronavirus 2 (CEPHEID - Performed in [place] hospital lab), Hosp Order     Status: None   Collection Time: 05/03/19  4:31 PM   Specimen: Nasopharyngeal Swab  Result Value Ref Range Status   SARS  Coronavirus 2 NEGATIVE NEGATIVE Final    Comment: (NOTE) If result is NEGATIVE SARS-CoV-2 target nucleic acids are NOT DETECTED. The SARS-CoV-2 RNA is generally detectable in upper and lower  respiratory specimens during the acute phase of infection. The lowest  concentration of SARS-CoV-2 viral copies this assay can detect is 250  copies / mL. A negative result does not preclude SARS-CoV-2 infection  and should not be used as the sole basis for treatment or other  patient management decisions.  A negative result may occur with  improper specimen collection / handling, submission of specimen other  than nasopharyngeal swab, presence of viral mutation(s) within the  areas targeted by this assay, and inadequate number of viral copies  (<250 copies / mL). A negative result must be combined with clinical  observations, patient history, and epidemiological information. If result is POSITIVE SARS-CoV-2 target nucleic acids are DETECTED. The SARS-CoV-2 RNA is generally detectable in upper and lower  respiratory specimens dur ing the acute phase of infection.  Positive  results are indicative of active infection with SARS-CoV-2.  Clinical  correlation with patient history and other diagnostic information is  necessary to determine patient infection status.  Positive results do  not rule out bacterial infection or co-infection with other viruses. If result is PRESUMPTIVE POSTIVE SARS-CoV-2 nucleic acids MAY BE PRESENT.   A presumptive positive result was obtained on the submitted specimen  and confirmed on repeat testing.  While 2019 novel coronavirus  (SARS-CoV-2) nucleic acids may be present in the submitted sample  additional confirmatory testing may be necessary for epidemiological  and / or clinical management purposes  to differentiate between  SARS-CoV-2 and other Sarbecovirus currently known to infect humans.  If clinically indicated additional testing with an alternate test   methodology [phone]) is advised. The SARS-CoV-2 RNA is generally  detectable in upper and lower respiratory sp ecimens during the acute  phase of infection. The expected result is Negative. Fact Sheet for Patients:  StrictlyIdeas.no Fact Sheet for Healthcare Providers: BankingDealers.co.za This test is not yet approved or cleared by the [place] FDA and has been authorized for detection and/or diagnosis of SARS-CoV-2 by FDA under an Emergency Use Authorization (EUA).  This EUA will remain in effect (meaning this test can be used) for the duration of the COVID-19 declaration under Section  564(b)(1) of the Act, 21 U.S.C. section 360bbb-3(b)(1), unless the authorization is terminated or revoked sooner. Performed at [facility], [place] [address] [place], [place] [address]   Culture, blood (Routine X 2) w Reflex to ID Panel     Status: None (Preliminary result)   Collection Time: 05/03/19  6:34 PM   Specimen: BLOOD  Result Value Ref Range Status   Specimen Description   Final    BLOOD BLOOD RIGHT HAND Performed at [place] [address], [place], [place] [address]    Special Requests   Final    BOTTLES DRAWN AEROBIC ONLY Blood Culture results may not be optimal due to an inadequate volume of blood received in culture bottles Performed at [place] [address] [place], [place] [address]    Culture   Final    NO GROWTH < 24 HOURS Performed at [place] [address] [place], [place] [address]    Report Status PENDING  Incomplete         Radiology Studies: Ct Head Wo Contrast  Result Date: 05/03/2019 CLINICAL DATA:  Weakness. The patient has been unable to eat for the past 24 hours. EXAM: CT HEAD WITHOUT CONTRAST TECHNIQUE: Contiguous axial images were obtained from the base of the skull through the vertex without intravenous contrast. COMPARISON:   Head CT scan 12/28/2016. FINDINGS: Brain: No evidence of acute infarction, hemorrhage, hydrocephalus, extra-axial collection or mass lesion/mass effect. Cortical atrophy noted. Vascular: No hyperdense vessel or unexpected calcification. Skull: Intact.  No focal lesion. Sinuses/Orbits: Negative. Other: None. IMPRESSION: No acute abnormality. Cortical atrophy. Electronically Signed   By: Inge Rise M.D.   On: 05/03/2019 11:59        Scheduled Meds: . cholestyramine  4 g Oral BID  . diphenoxylate-atropine  2 tablet Oral QID  . enoxaparin  90 mg Subcutaneous Q24H  . feeding supplement (ENSURE ENLIVE)  237 mL Oral TID BM  . ferrous sulfate  325 mg Oral Daily  . levothyroxine  150 mcg Oral Daily  . loperamide  2 mg Oral QID  . multivitamin with minerals  1 tablet Oral Daily  . octreotide  50 mcg Subcutaneous Q12H  . polycarbophil  1,250 mg Oral BID  . potassium chloride SA  20 mEq Oral Daily  . predniSONE  10 mg Oral Q breakfast  . sodium chloride flush  10-40 mL Intracatheter Q12H  . temazepam  15 mg Oral QHS  . vitamin B-12  1,000 mcg Oral Daily   Continuous Infusions: . cefTRIAXone (ROCEPHIN)  IV 1 g (05/04/19 1535)  . dextrose 5 % and 0.9% NaCl 125 mL/hr at 05/05/19 0749     LOS: 1 day    Time spent: 35 minutes    Elmarie Shiley, MD [facility] Pager [phone]  If 7PM-7AM, please contact night-coverage [email or web address] Password [facility] 05/05/2019, 9:13 AM

## 2019-05-05 NOTE — Progress Notes (Signed)
Pt asked to be placed on bed pan. When RN asked pt if she felt the urge to urinate or have a bowel movement, she stated "I feel like I need to pee". Pt was placed on bed pan and this RN noticed the stool looked to be coming from her vaginal opening and not her urethra or rectum when cleaning pt. The stool was green/brown with some small flakes in it and was mucous-like. Will continue to monitor the pt.

## 2019-05-05 NOTE — Progress Notes (Signed)
When patient voided this shift, entire contents were stool. Black, flaky, particles from vaginal opening/urethra which also were on labia. Witnessed by this RN and Nurse Kemper Durie, Janett Billow. When asked if this has ever happened to her before, she stated "yes, a couple weeks ago." Peri care was performed and purewick catheter was replaced appropriately. Patient has only voided 71ml through purewick and contents are cloudy and brown. Dr. Tyrell Antonio aware.

## 2019-05-05 NOTE — Progress Notes (Signed)
I was called to see the patient as there was a concern for a possible colovesical fistula after nursing staff saw contents that resembled colostomy output in the patient's urine, per their note. I spoke an examined the patient. She denies pneumoturia and denies seeing colostomy like output in her urine. She does note some dysuria. She is currently on external urinary catheter. Patient urinated while in the room. Contents in cannister examined and do not resemble that of what is in her colostomy bag currently. They appear pale and cloudy. Her UA from this morning also was reviewed and was negative for bilirubin. No current concerns for a colovesical fistula at this time. I have called and discussed this with Dr. Tyrell Antonio. We will sign off.  Gen: Awake and alert, NAD Abd: Soft upper abdomen, mass/firmness noted of the lower abdomen, non-tender, ostomy pink and viable, stool noted in ostomy bag, +BS

## 2019-05-05 NOTE — Progress Notes (Signed)
Patient hasn't voided yet this shift, but when asked if she feels the urge she stated "yeah." When voiding, the contents looked exactly like the colostomy output. Dr. Tyrell Antonio made aware. Will continue with plan of care.

## 2019-05-05 NOTE — Progress Notes (Signed)
Urine culture is growing out citrobacter. This organism is often associated with the AmpC beta lactamase. D/w with Dr Nestor Ramp and we will change ceftriaxone to cefepime to complete a total of 5d abx.   Ceftriaxone 6/16-6/17 Cefepime 2g IV q12 x3d  Onnie Boer, PharmD, BCIDP, AAHIVP, CPP Infectious Disease Pharmacist 05/05/2019 11:06 AM

## 2019-05-05 NOTE — Progress Notes (Signed)
Patient has had two additional occurrences of loose stool from vaginal opening. She states she has the urge to void, but only dark brown flakes/liquid comes out, resembling the colostomy output. Purewick does not work when this occurs as it becomes clogged.

## 2019-05-05 NOTE — Progress Notes (Addendum)
IP PROGRESS NOTE  Subjective:   Sleeping quietly this morning.  Still having increased output through her colostomy but this is decreasing.  Output is more formed.  She reports that she is urinating well.  She has no other complaints this morning.  Objective: Vital signs in last 24 hours: Blood pressure 100/67, pulse 76, temperature (!) 97.2 F (36.2 C), temperature source Oral, resp. rate 18, height '5\' 7"'  (1.702 m), weight 136 lb 11 oz (62 kg), SpO2 97 %.  Intake/Output from previous day: 06/17 0701 - 06/18 0700 In: 3201.6 [P.O.:900; I.V.:2218.7; IV Piggyback:82.9] Out: 1750 [Urine:800; Stool:950]  Physical Exam:  HEENT: No thrush Abdomen: Nontender, no hepatomegaly, formed brown stool in the ostomy bag Extremities: 1+ edema at the left lower leg; trace edema to the right lower extremity  Portacath/PICC-without erythema  Lab Results: Recent Labs    05/04/19 0625 05/05/19 0811  WBC 5.1 5.4  HGB 9.0* 8.7*  HCT 29.0* 29.6*  PLT 242 259    BMET Recent Labs    05/04/19 0625 05/05/19 0811  NA 137 136  K 3.8 3.7  CL 105 109  CO2 22 19*  GLUCOSE 64* 135*  BUN 22 19  CREATININE 0.52 0.49  CALCIUM 7.1* 7.2*    Lab Results  Component Value Date   CEA1 178.58 (H) 03/28/2019     Medications: I have reviewed the patient's current medications.  Assessment/Plan: 1.Adenocarcinoma the distal sigmoid colon, poorly differentiated, stage II (T3 N0), status post a laparoscopic assisted sigmoid colectomy 08/09/2015  No loss of mismatch repair protein expression  Elevated preoperative CEA  Staging CT of the abdomen and pelvis 06/22/2015 with no evidence of distant metastatic disease  Mildly elevated CEA 02/23/2017And 04/17/2016  CTs chest, abdomen, and pelvis on 06/04/2016, 38m left upper lobe nodule-no comparison available, new hypodense lesion in the left liver, solid Right renal mass  MRI 06/10/2016-2 enhancing lesions in the liver consistent with metastatic  disease, segment 2 and segment 5. Solid enhancing right renal lesion consistent with a renal cell carcinoma  Radiofrequency ablation of 2 liver lesions on 07/25/2016  Cycle 1 adjuvant Xeloda 08/18/2016  Cycle 2 Xeloda 09/08/2016  CEA improved 09/26/2016  Cycle 3 Xeloda held 09/29/2016 due to unexplained abdominal pain,referred for CT  CT abdomen/pelvis 09/30/2016 with a long segment of bowel wall thickening involving the distal ileum; lesion left hepatic lobe similar to slightly smaller following ablation; lesion posterior right hepatic lobe elongated favored to represent treatment tract.  Cycle 3 Xeloda 10/10/2016 (dose reduced due to drug induced enteritis following cycle 2)discontinued 10/15/2016 secondary to abdominal pain.  CT in while the emergency room with abdominal pain 10/15/2016 revealed descending colitis  Colonoscopy 10/30/2016 confirmed a mass at the: colo-colonicanastomosis  Cycle 4 Xeloda 11/03/2016  01/09/2017 status post a low anterior resection for removal of locally recurrent tumor at the sigmoid anastomosis, resection margins negative, tumor invades pericolonic soft tissue, 12 benign lymph nodes;MSI stable. Foundation 1 testing- KRAS G13D, MSS, tumor mutation burden-3,BRAFwild-type  Cycle 5 Xeloda 02/12/2017  Cycle 6 Xeloda 03/12/2017, Xeloda dose reduced to 1000 mg twice daily on 03/18/2017  Cycle 7 Xeloda 04/09/2017  Cycle 8 Xeloda 05/12/2017  Restaging CTs 06/24/2017-no evidence of metastatic disease involving the chest. Post ablation changes in the liver. No new hepatic metastatic disease.  Restaging CTs 12/14/2017- stable liver ablation sites, indeterminate nodule adjacent to the descending colon, stable left renal mass, changes of early cirrhosis, enlarged porta hepatis node-likely reactive  MRI abdomen 04/22/2018- enlargement of 1.7 cm  left paracolic gutter mass, stable 1 cm extrahepatic left liver capsule lesion, no evidence of local tumor  recurrence at the ablation sites, no new liver lesions, stable right kidney mass  Colonoscopy 05/19/2018- mass in the rectum at the anastomotic site beginning at 3 cm from the anal verge, biopsy confirmed invasive adenocarcinoma  CT 06/07/2018-stable liver ablation sites, enlarging peritoneal lesion at the left pericolic gutter, stable nodule in the sigmoid mesocolon, stable prominent periportal lymph nodes, no evidence of metastatic disease to the chest  Radiation/Xeloda beginning 06/21/2018  Xeloda dose reduced to 1000 mg twice daily days of radiationbeginning8/16/2019 due to neutropenia  Radiation/Xeloda completed 08/02/2018  CTs 10/07/2018- new 6 mm right upper lobe nodule, stable ablation changes in the left and right liver, stable superior pole right renal mass, increased soft tissue thickening the presacral region, stable 12 mm porta hepatis node, increase in size of soft tissue nodule adjacent to the descending colon-2.1 x 2.2 cm  CTs 01/11/2019- anterior right upper lobe subpleural nodule-smaller, no suspicious lung nodules, stable ablation defects in the liver, stable right kidney mass, wall thickening at the suture line in the lower pelvis with extension to the presacral region/vaginal fornix, slight enlargement of left pericolic gutter peritoneal implant  Sigmoidoscopy 01/27/2019- mass at 5 cm from the anal verge, biopsy confirmed adenocarcinoma  Laparoscopic transverse colostomy and Port-A-Cath placement 03/02/2019- tumor noted to be studding the descending colon  Cycle 1 FOLFOX 04/04/2019    2.History ofMicrocytic anemia-likely iron deficiency anemia secondary to colon cancer, persistent microcytic anemia;ferrous sulfate increased to twice daily 12/15/2017;hemoglobin in normal range 03/16/2018  3.Tubular villous adenoma on the sigmoid colon resection specimen 08/09/2015  Multiple tubular adenomas removed on a colonoscopy 11/16/2015  4. Right renal mass on CT  06/04/2016-suspicious for renal cell carcinoma;stable on CT 06/24/2017;referred to urology  Stable on CT 01/11/2019  5. History ofneutropenia secondary to chemotherapy  6.Pain secondary to local recurrence of colon cancer-improved  7.Abdominal pain. CT abdomen/pelvis 07/24/2018- inflammatory changes and mild dilatation of multiple small bowel loops in the lower abdomen and pelvis. Unchanged metastatic implant in the left paracolic gutter; unchanged periportal lymphadenopathy; hepatic steatosis; stable liver ablation site; unchanged 2.7 cm enhancing right renal mass.Resolved.  8.Severe macrocytic anemia 03/28/2019- vitamin B12 level at the low end of normal, hypersegmented neutrophils noted on peripheral blood smear, DAT positive, elevated LDH and bilirubin  Prednisone, 60 mg daily started 03/29/2019  1 unit of packed red blood cells 03/28/2019  Hemoglobin improved 03/30/2019, stable 03/31/2019  B12 initiated 03/30/2019  Hemoglobin improved   9.  Admission 04/24/2019 with presyncope 10.  Urinary tract infection 04/24/2019 11.  Bilateral lower extremity DVTs on Doppler 04/25/2019- right peroneal, left posterior tibial and left peroneal  Lovenox started  Ms. Tash appears to be improving.  She remains on Lomotil, Imodium, Questran, and octreotide.  Stool is more formed today.  She remains anemic which is stable.  Recommendations: 1.  Consider Cutting back on IV fluid rate.  Monitor fluid balance 2.  Monitor input/output including stool output 3.  Continue Lomotil, Imodium, Questran.  We will stop octreotide after this evening's dose.  Begin tincture of opium. 4. Dietitian consult for calorie count and supplements.  5.  Taper the prednisone to 5 mg daily   LOS: 1 day   Mikey Bussing, NP   05/05/2019, 11:02 AM  Ms. Vaquerano is interviewed and examined.  The stool output has diminished over the past day.  I recommend continuing the current antidiarrheal regimen with the  addition  of tincture of opium.  Discontinue octreotide after today.  She was on prednisone for hemolytic anemia.  The hemoglobin has stabilized.  The anemia is also secondary to chronic disease, multiple blood draws, and chemotherapy.  I tapered the prednisone to 5 mg daily.  She can be discharged to home if the stool output remains lower and she can maintain hydration by mouth.  I will be out until 05/09/2019.  Please call oncology as needed.

## 2019-05-06 ENCOUNTER — Inpatient Hospital Stay: Payer: Medicare Other | Admitting: Nurse Practitioner

## 2019-05-06 ENCOUNTER — Telehealth: Payer: Self-pay | Admitting: Nurse Practitioner

## 2019-05-06 DIAGNOSIS — D509 Iron deficiency anemia, unspecified: Secondary | ICD-10-CM

## 2019-05-06 LAB — BASIC METABOLIC PANEL
Anion gap: 6 (ref 5–15)
BUN: 15 mg/dL (ref 8–23)
CO2: 20 mmol/L — ABNORMAL LOW (ref 22–32)
Calcium: 7.2 mg/dL — ABNORMAL LOW (ref 8.9–10.3)
Chloride: 111 mmol/L (ref 98–111)
Creatinine, Ser: 0.49 mg/dL (ref 0.44–1.00)
GFR calc Af Amer: >60 mL/min (ref >60)
GFR calc non Af Amer: >60 mL/min (ref >60)
Glucose, Bld: 143 mg/dL — ABNORMAL HIGH (ref 70–99)
Potassium: 4.1 mmol/L (ref 3.5–5.1)
Sodium: 137 mmol/L (ref 135–145)

## 2019-05-06 LAB — CBC
HCT: 30.3 % — ABNORMAL LOW (ref 36.0–46.0)
Hemoglobin: 8.9 g/dL — ABNORMAL LOW (ref 12.0–15.0)
MCH: 30.6 pg (ref 26.0–34.0)
MCHC: 29.4 g/dL — ABNORMAL LOW (ref 30.0–36.0)
MCV: 104.1 fL — ABNORMAL HIGH (ref 80.0–100.0)
Platelets: 289 10*3/uL (ref 150–400)
RBC: 2.91 MIL/uL — ABNORMAL LOW (ref 3.87–5.11)
RDW: 17.8 % — ABNORMAL HIGH (ref 11.5–15.5)
WBC: 4.9 10*3/uL (ref 4.0–10.5)
nRBC: 0 % (ref 0.0–0.2)

## 2019-05-06 LAB — GLUCOSE, CAPILLARY
Glucose-Capillary: 103 mg/dL — ABNORMAL HIGH (ref 70–99)
Glucose-Capillary: 108 mg/dL — ABNORMAL HIGH (ref 70–99)
Glucose-Capillary: 114 mg/dL — ABNORMAL HIGH (ref 70–99)
Glucose-Capillary: 118 mg/dL — ABNORMAL HIGH (ref 70–99)
Glucose-Capillary: 121 mg/dL — ABNORMAL HIGH (ref 70–99)
Glucose-Capillary: 125 mg/dL — ABNORMAL HIGH (ref 70–99)
Glucose-Capillary: 132 mg/dL — ABNORMAL HIGH (ref 70–99)

## 2019-05-06 LAB — MAGNESIUM: Magnesium: 2.1 mg/dL (ref 1.7–2.4)

## 2019-05-06 MED ORDER — GERHARDT'S BUTT CREAM
TOPICAL_CREAM | Freq: Four times a day (QID) | CUTANEOUS | Status: DC
  Administered 2019-05-06: 1 via TOPICAL
  Administered 2019-05-06 (×2): via TOPICAL
  Administered 2019-05-07: 1 via TOPICAL
  Administered 2019-05-07 – 2019-05-10 (×13): via TOPICAL
  Filled 2019-05-06: qty 1

## 2019-05-06 NOTE — Progress Notes (Signed)
OT Cancellation Note  Patient Details Name: Nichole Cross MRN: [id number] DOB: [birth year]   Cancelled Treatment:    Reason Eval/Treat Not Completed: Fatigue/lethargy limiting ability to participate.  Pt had a very busy day and too tired to participate in OT. Will check back  Another day  Breiona Couvillon 05/06/2019, 3:30 PM  Lesle Chris, OTR/L Acute Rehabilitation Services [phone] [facility] pager [phone] office 05/06/2019

## 2019-05-06 NOTE — Progress Notes (Signed)
IP PROGRESS NOTE  Subjective:   Has increased liquid brown output through ostomy today. Nursing notes reviewed which report possible stool from her vaginal opening. She has no abdominal pain, nausea, vomiting. Eating small amounts. Dietitian following. She has no other complaints this morning.   Objective: Vital signs in last 24 hours: Blood pressure (!) 90/59, pulse 80, temperature 98.4 F (36.9 C), temperature source Oral, resp. rate 16, height '5\' 7"'  (1.702 m), weight 152 lb (68.9 kg), SpO2 98 %.  Intake/Output from previous day: 06/18 0701 - 06/19 0700 In: 3998.6 [P.O.:597; I.V.:3201.6; IV Piggyback:200] Out: 1150 [Urine:50; Stool:1100]  Physical Exam:  HEENT: No thrush Abdomen: Nontender, no hepatomegaly, liquid brown stool in the ostomy bag Extremities: 1+ edema at the left lower leg; trace edema to the right lower extremity  Portacath/PICC-without erythema  Lab Results: Recent Labs    05/05/19 0811 05/06/19 1037  WBC 5.4 4.9  HGB 8.7* 8.9*  HCT 29.6* 30.3*  PLT 259 289    BMET Recent Labs    05/05/19 0811 05/06/19 0348  NA 136 137  K 3.7 4.1  CL 109 111  CO2 19* 20*  GLUCOSE 135* 143*  BUN 19 15  CREATININE 0.49 0.49  CALCIUM 7.2* 7.2*    Lab Results  Component Value Date   CEA1 178.58 (H) 03/28/2019     Medications: I have reviewed the patient's current medications.  Assessment/Plan: 1.Adenocarcinoma the distal sigmoid colon, poorly differentiated, stage II (T3 N0), status post a laparoscopic assisted sigmoid colectomy 08/09/2015  No loss of mismatch repair protein expression  Elevated preoperative CEA  Staging CT of the abdomen and pelvis 06/22/2015 with no evidence of distant metastatic disease  Mildly elevated CEA 02/23/2017And 04/17/2016  CTs chest, abdomen, and pelvis on 06/04/2016, 67m left upper lobe nodule-no comparison available, new hypodense lesion in the left liver, solid Right renal mass  MRI 06/10/2016-2 enhancing lesions  in the liver consistent with metastatic disease, segment 2 and segment 5. Solid enhancing right renal lesion consistent with a renal cell carcinoma  Radiofrequency ablation of 2 liver lesions on 07/25/2016  Cycle 1 adjuvant Xeloda 08/18/2016  Cycle 2 Xeloda 09/08/2016  CEA improved 09/26/2016  Cycle 3 Xeloda held 09/29/2016 due to unexplained abdominal pain,referred for CT  CT abdomen/pelvis 09/30/2016 with a long segment of bowel wall thickening involving the distal ileum; lesion left hepatic lobe similar to slightly smaller following ablation; lesion posterior right hepatic lobe elongated favored to represent treatment tract.  Cycle 3 Xeloda 10/10/2016 (dose reduced due to drug induced enteritis following cycle 2)discontinued 10/15/2016 secondary to abdominal pain.  CT in while the emergency room with abdominal pain 10/15/2016 revealed descending colitis  Colonoscopy 10/30/2016 confirmed a mass at the: colo-colonicanastomosis  Cycle 4 Xeloda 11/03/2016  01/09/2017 status post a low anterior resection for removal of locally recurrent tumor at the sigmoid anastomosis, resection margins negative, tumor invades pericolonic soft tissue, 12 benign lymph nodes;MSI stable. Foundation 1 testing- KRAS G13D, MSS, tumor mutation burden-3,BRAFwild-type  Cycle 5 Xeloda 02/12/2017  Cycle 6 Xeloda 03/12/2017, Xeloda dose reduced to 1000 mg twice daily on 03/18/2017  Cycle 7 Xeloda 04/09/2017  Cycle 8 Xeloda 05/12/2017  Restaging CTs 06/24/2017-no evidence of metastatic disease involving the chest. Post ablation changes in the liver. No new hepatic metastatic disease.  Restaging CTs 12/14/2017- stable liver ablation sites, indeterminate nodule adjacent to the descending colon, stable left renal mass, changes of early cirrhosis, enlarged porta hepatis node-likely reactive  MRI abdomen 04/22/2018- enlargement of 1.7 cm  left paracolic gutter mass, stable 1 cm extrahepatic left liver capsule  lesion, no evidence of local tumor recurrence at the ablation sites, no new liver lesions, stable right kidney mass  Colonoscopy 05/19/2018- mass in the rectum at the anastomotic site beginning at 3 cm from the anal verge, biopsy confirmed invasive adenocarcinoma  CT 06/07/2018-stable liver ablation sites, enlarging peritoneal lesion at the left pericolic gutter, stable nodule in the sigmoid mesocolon, stable prominent periportal lymph nodes, no evidence of metastatic disease to the chest  Radiation/Xeloda beginning 06/21/2018  Xeloda dose reduced to 1000 mg twice daily days of radiationbeginning8/16/2019 due to neutropenia  Radiation/Xeloda completed 08/02/2018  CTs 10/07/2018- new 6 mm right upper lobe nodule, stable ablation changes in the left and right liver, stable superior pole right renal mass, increased soft tissue thickening the presacral region, stable 12 mm porta hepatis node, increase in size of soft tissue nodule adjacent to the descending colon-2.1 x 2.2 cm  CTs 01/11/2019- anterior right upper lobe subpleural nodule-smaller, no suspicious lung nodules, stable ablation defects in the liver, stable right kidney mass, wall thickening at the suture line in the lower pelvis with extension to the presacral region/vaginal fornix, slight enlargement of left pericolic gutter peritoneal implant  Sigmoidoscopy 01/27/2019- mass at 5 cm from the anal verge, biopsy confirmed adenocarcinoma  Laparoscopic transverse colostomy and Port-A-Cath placement 03/02/2019- tumor noted to be studding the descending colon  Cycle 1 FOLFOX 04/04/2019    2.History ofMicrocytic anemia-likely iron deficiency anemia secondary to colon cancer, persistent microcytic anemia;ferrous sulfate increased to twice daily 12/15/2017;hemoglobin in normal range 03/16/2018  3.Tubular villous adenoma on the sigmoid colon resection specimen 08/09/2015  Multiple tubular adenomas removed on a colonoscopy 11/16/2015  4.  Right renal mass on CT 06/04/2016-suspicious for renal cell carcinoma;stable on CT 06/24/2017;referred to urology  Stable on CT 01/11/2019  5. History ofneutropenia secondary to chemotherapy  6.Pain secondary to local recurrence of colon cancer-improved  7.Abdominal pain. CT abdomen/pelvis 07/24/2018- inflammatory changes and mild dilatation of multiple small bowel loops in the lower abdomen and pelvis. Unchanged metastatic implant in the left paracolic gutter; unchanged periportal lymphadenopathy; hepatic steatosis; stable liver ablation site; unchanged 2.7 cm enhancing right renal mass.Resolved.  8.Severe macrocytic anemia 03/28/2019- vitamin B12 level at the low end of normal, hypersegmented neutrophils noted on peripheral blood smear, DAT positive, elevated LDH and bilirubin  Prednisone, 60 mg daily started 03/29/2019  1 unit of packed red blood cells 03/28/2019  Hemoglobin improved 03/30/2019, stable 03/31/2019  B12 initiated 03/30/2019  Hemoglobin improved   9.  Admission 04/24/2019 with presyncope 10.  Urinary tract infection 04/24/2019 11.  Bilateral lower extremity DVTs on Doppler 04/25/2019- right peroneal, left posterior tibial and left peroneal  Lovenox started  Ms. Fosse appears to be unchanged.  She remains on Lomotil, Imodium, Questran, fiber, and tincture of opium. Stool remains loose. Has been evaluated by surgery who does not think she has a fistula. No surgical intervention recommended.  She remains anemic which is stable.  Recommendations: 1.  Continue to monitor fluid balance. 2.  Monitor input/output including stool output 3.  Continue Imodium, Lomotil, fiber, Questran, and tincture of opium.  4.  Dietitian following closely.  5.  Continue prednisone 5 mg daily   LOS: 2 days   Mikey Bussing, NP   05/06/2019, 1:26 PM

## 2019-05-06 NOTE — Progress Notes (Signed)
Subjective: CC: Dysuria Patient denies abdominal pain, n/v. 1100cc from colostomy overnight. Continues to complain of dysuria. PT found me prior to going in the room and were concerned that the patient may have passed flatus out of her bottom and had some liquid stool content from her anus when on the 3 in 1 commode this morning. Nursing notes report possible stool content from her vagina. Patient only complains to me of dysuria.   Objective: Vital signs in last 24 hours: Temp:  [97.6 F (36.4 C)-99 F (37.2 C)] 99 F (37.2 C) (06/19 0515) Pulse Rate:  [69-88] 69 (06/19 0515) Resp:  [18] 18 (06/19 0515) BP: (92-115)/(60-78) 92/60 (06/19 0515) SpO2:  [98 %-100 %] 100 % (06/19 0515) Weight:  [68.9 kg] 68.9 kg (06/19 0515) Last BM Date: 05/05/19  Intake/Output from previous day: 06/18 0701 - 06/19 0700 In: 3998.6 [P.O.:597; I.V.:3201.6; IV Piggyback:200] Out: 1150 [Urine:50; Stool:1100] Intake/Output this shift: Total I/O In: -  Out: 355 [Urine:155; Stool:200]  PE: Gen: Awake and alert, NAD Heart: Regular Lungs: CTA b/l. Normal effort Abd: Soft, mild generalized tenderness without point tenderness. No r/r/g. Colostomy bag with light green liquid stool. 1100 out/24 hours. 200 reported today. +BS. Stoma is pink and viable.  GU: Chaperone Child psychotherapist present. Patient without any noted stool coming from vaginal opening or urethra. Contents in external urinary catheter appears cloudy and different than that of in the ostomy bag. The patient vaginal tissue does appear very inflamed and irritated. It is moist, edematous, red and with friable/bleeding tissue. No evidence of abscess. Very tender to the touch.  Msk: No edema.   Lab Results:  Recent Labs    05/05/19 0811 05/06/19 1037  WBC 5.4 4.9  HGB 8.7* 8.9*  HCT 29.6* 30.3*  PLT 259 289   BMET Recent Labs    05/05/19 0811 05/06/19 0348  NA 136 137  K 3.7 4.1  CL 109 111  CO2 19* 20*  GLUCOSE 135* 143*  BUN 19  15  CREATININE 0.49 0.49  CALCIUM 7.2* 7.2*   PT/INR No results for input(s): LABPROT, INR in the last 72 hours. CMP     Component Value Date/Time   NA 137 05/06/2019 0348   NA 138 05/27/2017 1408   K 4.1 05/06/2019 0348   K 3.9 05/27/2017 1408   CL 111 05/06/2019 0348   CO2 20 (L) 05/06/2019 0348   CO2 23 05/27/2017 1408   GLUCOSE 143 (H) 05/06/2019 0348   GLUCOSE 93 05/27/2017 1408   BUN 15 05/06/2019 0348   BUN 13.8 05/27/2017 1408   CREATININE 0.49 05/06/2019 0348   CREATININE 0.72 05/02/2019 1158   CREATININE 0.99 (H) 04/14/2018 1420   CREATININE 1.1 05/27/2017 1408   CALCIUM 7.2 (L) 05/06/2019 0348   CALCIUM 9.5 05/27/2017 1408   PROT 4.3 (L) 05/04/2019 0625   PROT 7.3 05/27/2017 1408   ALBUMIN 1.7 (L) 05/04/2019 0625   ALBUMIN 4.0 05/27/2017 1408   AST 19 05/04/2019 0625   AST 30 05/02/2019 1158   AST 20 05/27/2017 1408   ALT 14 05/04/2019 0625   ALT 18 05/02/2019 1158   ALT 8 05/27/2017 1408   ALKPHOS 404 (H) 05/04/2019 0625   ALKPHOS 78 05/27/2017 1408   BILITOT 0.4 05/04/2019 0625   BILITOT 0.3 05/02/2019 1158   BILITOT 0.53 05/27/2017 1408   GFRNONAA >60 05/06/2019 0348   GFRNONAA >60 05/02/2019 1158   GFRNONAA 56 (L) 04/14/2018 1420   GFRAA >  60 05/06/2019 0348   GFRAA >60 05/02/2019 1158   GFRAA 65 04/14/2018 1420   Lipase     Component Value Date/Time   LIPASE 18 05/03/2019 1034       Studies/Results: No results found.  Anti-infectives: Anti-infectives (From admission, onward)   Start     Dose/Rate Route Frequency Ordered Stop   05/05/19 1200  ceFEPIme (MAXIPIME) 2 g in sodium chloride 0.9 % 100 mL IVPB     2 g 200 mL/hr over 30 Minutes Intravenous Every 12 hours 05/05/19 1102 05/08/19 1159   05/04/19 1600  cefTRIAXone (ROCEPHIN) 1 g in sodium chloride 0.9 % 100 mL IVPB  Status:  Discontinued     1 g 200 mL/hr over 30 Minutes Intravenous Every 24 hours 05/03/19 1908 05/05/19 1102   05/03/19 1615  cefTRIAXone (ROCEPHIN) 1 g in sodium  chloride 0.9 % 100 mL IVPB     1 g 200 mL/hr over 30 Minutes Intravenous  Once 05/03/19 1609 05/03/19 1726       Assessment/Plan Colon Cancer  Weakness  Increased ostomy output  - Hx  Laparoscopic assisted sigmoid colectomy with lysis of adhesions - Dr. Zella Richer, 08/09/2015 - Hx of Robotic LAR - Dr. Marcello Moores 01/09/2017 - Hx Laparoscopic colostomy creation, SBR, bladder repair and Port-A-Cath placement -Dr. Marcello Moores  03/02/2019  - Ostomy output down from 2.1L on admission to 1.1L in last 24 hours. Appears appropriate. Continue current therapies. (Immodium, Opium, Lomotil, Fibercon). Avoid high sugary drinks.  - There was concern for possible fistula from nursing staff to patient's bladder.  She denies pneumaturia or stool in urine. No bilirubin in urine. This clinically argues against a fistula to her bladder. Per Dr. Marcello Moores note yesterday could have tumor shedding into her bladder. No CT since February, however, surgically we do not have anything more to offer so this would not change the plan of management. I discussed this with the patient who states understanding of this.  - Patient does have significant irritation to her vaginal area from what looks like excess moisture. Wrote for a mattress replacement to circulate air and help dry out the area. Will write for Gehardt's cream to be applied QID. Please take off external urinary catheter, clean and dry the area before applying the cream.   FEN - Diet Regular  VTE - SCDs, Therapeutic Lovenox  ID - Cefepime    LOS: 2 days    Jillyn Ledger , [facility] 05/06/2019, 12:27 PM Pager: [phone]

## 2019-05-06 NOTE — Progress Notes (Signed)
Nutrition Brief Note                                     **RD working remotely**  RD consulted for PO poor intake, Calorie Count and short bowel syndrome diet education.  Initial assessment completed 6/17. Order for 48 hour Calorie Count placed. Per PO documentation for 6/18, 0% PO was consumed. Supplements were provided. Diet education to be provided once calorie count complete and PO improves.  If additional nutrition issues arise, please consult RD.   Clayton Bibles, MS, RD, [place] [facility] Pager: [phone] After Hours Pager: [phone]

## 2019-05-06 NOTE — Progress Notes (Signed)
PROGRESS NOTE    Nichole Cross  JSR:[id number] DOB: [birth year] DOA: 05/03/2019 PCP: [name], MD    Brief Narrative ; 78 year old with past medical history significant for adenocarcinoma of the distal sigmoid colon poorly differentiated stage II a status post laparoscopic-assisted sigmoid colectomy on 9/26 12/2014 with ongoing chemotherapy, of the anal verge status post laparoscopic transverse colostomy and a Port-A-Cath placement on 4/15 2020 tumor to be yesterday in the descending colon is status post 1 cycle FOLFOX on 04/04/2019, history of renal cell mass suspicious for renal cell carcinoma stable, chronic pain secondary to local recurrence of colon cancer, history of volume depletion secondary to increased ostomy output, recently hospitalized from 04/23/2020 to 04/30/2019 for bilateral lower extremity DVTs, near syncope and increase output from ostomy who was discharged on Imodium, Metamucil, Lomotil.  Patient follow-up with oncology on 6/15 she was given IV fluids and a tincture of opium.  She presents to the ED with significant weakness to the point that she is not able to stand, continue to have increased out food from ostomy.  Patient family also noticed that her urine was black and dark.  Evaluation in the ED urine analysis consistent with UTI, creatinine at 2.2, hemoglobin 9.7.  Covid  negative.  Assessment & Plan:   Principal Problem:   Weakness Active Problems:   Hypothyroidism   Cystitis   Sigmoid colon cancer s/p lap assisted sigmoid colectomy 08/09/15   Anemia, iron deficiency   Adenocarcinoma of colon metastatic to liver (HCC)   Dehydration   Acute lower UTI   Severe dehydration   Leg DVT (deep venous thromboembolism), acute, bilateral (HCC)  1-Generalized weakness likely related to failure to thrive related to increased ostomy output: Continue with IV fluids. PT evaluation. Continue to be weak.   2-Increase ostomy output: Likely secondary to short gut syndrome.  Continue with Imodium, lomotil Continue with Questran and octreotide subcu last dose tonight.  General surgery consulted and sign off.  Started on fibercorn by general surgery.  Discussed with Dr Benay Spice, plan to start opium 6-18 Out put today liquid at 1 L Patient again notice to have stool content coming out of urethrae vs vagina. Surgery to evaluate patient again.    3-UTI: Change ceftriaxone to cefepime, urine growing citrobacter.   4-Hypothyroidism: Continue with Synthroid.  Acute bilateral lower extremity DVT: Diagnosis 6/8. Continue with Lovenox   Dehydration: Continue with IV fluids.  Renal mass: Under surveillance Adenocarcinoma of the colon with mets to the liver: Patient undergoing chemotherapy status post transverse colostomy and Port-A-Cath placement 03/07/2019, started chemotherapy FOLFOX cycle 1 on 04/04/2019.   Oncology consulted.  Hypoglycemia: Change IV fluid to D5 normal saline. Estimated body mass index is 23.81 kg/m as calculated from the following:   Height as of this encounter: 5\' 7"  (1.702 m).   Weight as of this encounter: 68.9 kg.   DVT prophylaxis: On Lovenox Code Status: DNR Family Communication:  update daughter 6-19* Disposition Plan: Remain in the hospital for IV fluids and treatment of increased output from ostomy, patient is not able to keep up with ostomy losses  Consultants:   Oncology  General surgery   Procedures:   None   Antimicrobials:   Ceftriaxone   Subjective: Alert, poor oral intake. She will try to eat breakfast today.  Per staff report patient had multiples episodes of stool content coming from urethra vs vagina.    Objective: Vitals:   05/05/19 0449 05/05/19 1508 05/05/19 2008 05/06/19 0515  BP: 100/67  108/62 115/78 92/60  Pulse: 76 72 88 69  Resp: 18 18 18 18   Temp: (!) 97.2 F (36.2 C) 97.6 F (36.4 C) 98.9 F (37.2 C) 99 F (37.2 C)  TempSrc: Oral Oral Oral   SpO2: 97% 98% 99% 100%  Weight:     68.9 kg  Height:        Intake/Output Summary (Last 24 hours) at 05/06/2019 0928 Last data filed at 05/06/2019 0736 Gross per 24 hour  Intake 3998.58 ml  Output 950 ml  Net 3048.58 ml   Filed Weights   05/03/19 1753 05/03/19 2117 05/06/19 0515  Weight: 62.2 kg 62 kg 68.9 kg    Examination:  General exam: NAD Respiratory system: CTA Cardiovascular system: S 1, S 2 RRR Gastrointestinal system: BS present, ostomy with bag present,  Central nervous system: alert, non focal.  Extremities: Symmetric power.  Skin: No rashes.    Data Reviewed: I have personally reviewed following labs and imaging studies  CBC: Recent Labs  Lab 04/30/19 0411 05/02/19 1158 05/03/19 1034 05/04/19 0625 05/05/19 0811  WBC 10.3 10.2 7.8 5.1 5.4  NEUTROABS  --  8.5* 6.4  --   --   HGB 8.7* 10.9* 9.7* 9.0* 8.7*  HCT 28.3* 34.5* 32.1* 29.0* 29.6*  MCV 101.8* 99.4 101.3* 103.2* 104.6*  PLT 195 380 324 242 431   Basic Metabolic Panel: Recent Labs  Lab 05/02/19 1158 05/03/19 1034 05/03/19 2049 05/04/19 0625 05/05/19 0811 05/06/19 0348  NA 131* 133*  --  137 136 137  K 5.0 4.3  --  3.8 3.7 4.1  CL 96* 98  --  105 109 111  CO2 24 22  --  22 19* 20*  GLUCOSE 94 76  --  64* 135* 143*  BUN 19 24*  --  22 19 15   CREATININE 0.72 0.62  --  0.52 0.49 0.49  CALCIUM 7.8* 7.6*  --  7.1* 7.2* 7.2*  MG 1.7 1.9 1.9  --  1.7  --    GFR: Estimated Creatinine Clearance: 59.1 mL/min (by C-G formula based on SCr of 0.49 mg/dL). Liver Function Tests: Recent Labs  Lab 05/02/19 1158 05/03/19 1034 05/04/19 0625  AST 30 25 19   ALT 18 18 14   ALKPHOS 529* 466* 404*  BILITOT 0.3 0.5 0.4  PROT 5.3* 4.8* 4.3*  ALBUMIN 2.0* 2.1* 1.7*   Recent Labs  Lab 05/03/19 1034  LIPASE 18   Recent Labs  Lab 05/03/19 1032  AMMONIA 68*   Coagulation Profile: No results for input(s): INR, PROTIME in the last 168 hours. Cardiac Enzymes: No results for input(s): CKTOTAL, CKMB, CKMBINDEX, TROPONINI in the last  168 hours. BNP (last 3 results) No results for input(s): PROBNP in the last 8760 hours. HbA1C: No results for input(s): HGBA1C in the last 72 hours. CBG: Recent Labs  Lab 05/05/19 1707 05/05/19 2005 05/06/19 0106 05/06/19 0511 05/06/19 0724  GLUCAP 138* 130* 132* 125* 121*   Lipid Profile: No results for input(s): CHOL, HDL, LDLCALC, TRIG, CHOLHDL, LDLDIRECT in the last 72 hours. Thyroid Function Tests: No results for input(s): TSH, T4TOTAL, FREET4, T3FREE, THYROIDAB in the last 72 hours. Anemia Panel: No results for input(s): VITAMINB12, FOLATE, FERRITIN, TIBC, IRON, RETICCTPCT in the last 72 hours. Sepsis Labs: No results for input(s): PROCALCITON, LATICACIDVEN in the last 168 hours.  Recent Results (from the past 240 hour(s))  Urine culture     Status: Abnormal   Collection Time: 05/03/19  2:25 PM   Specimen: Urine,  Random  Result Value Ref Range Status   Specimen Description   Final    URINE, RANDOM Performed at [facility] [address], [place], [place] [address]    Special Requests   Final    NONE Performed at [facility], [place] [address], [place], [place] [address]    Culture >=100,000 COLONIES/mL CITROBACTER FREUNDII (A)  Final   Report Status 05/05/2019 FINAL  Final   Organism ID, Bacteria CITROBACTER FREUNDII (A)  Final      Susceptibility   Citrobacter freundii - MIC*    CEFAZOLIN >=64 RESISTANT Resistant     CEFTRIAXONE 4 SENSITIVE Sensitive     CIPROFLOXACIN >=4 RESISTANT Resistant     GENTAMICIN >=16 RESISTANT Resistant     IMIPENEM <=0.25 SENSITIVE Sensitive     NITROFURANTOIN <=16 SENSITIVE Sensitive     TRIMETH/SULFA >=320 RESISTANT Resistant     PIP/TAZO 16 SENSITIVE Sensitive     * >=100,000 COLONIES/mL CITROBACTER FREUNDII  SARS Coronavirus 2 (CEPHEID - Performed in [place] hospital lab), Hosp Order     Status: None   Collection Time: 05/03/19  4:31 PM   Specimen: Nasopharyngeal Swab  Result Value  Ref Range Status   SARS Coronavirus 2 NEGATIVE NEGATIVE Final    Comment: (NOTE) If result is NEGATIVE SARS-CoV-2 target nucleic acids are NOT DETECTED. The SARS-CoV-2 RNA is generally detectable in upper and lower  respiratory specimens during the acute phase of infection. The lowest  concentration of SARS-CoV-2 viral copies this assay can detect is 250  copies / mL. A negative result does not preclude SARS-CoV-2 infection  and should not be used as the sole basis for treatment or other  patient management decisions.  A negative result may occur with  improper specimen collection / handling, submission of specimen other  than nasopharyngeal swab, presence of viral mutation(s) within the  areas targeted by this assay, and inadequate number of viral copies  (<250 copies / mL). A negative result must be combined with clinical  observations, patient history, and epidemiological information. If result is POSITIVE SARS-CoV-2 target nucleic acids are DETECTED. The SARS-CoV-2 RNA is generally detectable in upper and lower  respiratory specimens dur ing the acute phase of infection.  Positive  results are indicative of active infection with SARS-CoV-2.  Clinical  correlation with patient history and other diagnostic information is  necessary to determine patient infection status.  Positive results do  not rule out bacterial infection or co-infection with other viruses. If result is PRESUMPTIVE POSTIVE SARS-CoV-2 nucleic acids MAY BE PRESENT.   A presumptive positive result was obtained on the submitted specimen  and confirmed on repeat testing.  While 2019 novel coronavirus  (SARS-CoV-2) nucleic acids may be present in the submitted sample  additional confirmatory testing may be necessary for epidemiological  and / or clinical management purposes  to differentiate between  SARS-CoV-2 and other Sarbecovirus currently known to infect humans.  If clinically indicated additional testing with an  alternate test  methodology [phone]) is advised. The SARS-CoV-2 RNA is generally  detectable in upper and lower respiratory sp ecimens during the acute  phase of infection. The expected result is Negative. Fact Sheet for Patients:  StrictlyIdeas.no Fact Sheet for Healthcare Providers: BankingDealers.co.za This test is not yet approved or cleared by the [place] FDA and has been authorized for detection and/or diagnosis of SARS-CoV-2 by FDA under an Emergency Use Authorization (EUA).  This EUA will remain in effect (meaning this  test can be used) for the duration of the COVID-19 declaration under Section 564(b)(1) of the Act, 21 U.S.C. section 360bbb-3(b)(1), unless the authorization is terminated or revoked sooner. Performed at [facility], [place] [address] [place], [place] [address]   Culture, blood (Routine X 2) w Reflex to ID Panel     Status: None (Preliminary result)   Collection Time: 05/03/19  6:34 PM   Specimen: BLOOD  Result Value Ref Range Status   Specimen Description   Final    BLOOD BLOOD RIGHT HAND Performed at [facility] [address], [place], [place] [address]    Special Requests   Final    BOTTLES DRAWN AEROBIC ONLY Blood Culture results may not be optimal due to an inadequate volume of blood received in culture bottles Performed at [facility] [address] [place], [place] [address]    Culture   Final    NO GROWTH 3 DAYS Performed at [facility], [place] [address], [place], [place] [address]    Report Status PENDING  Incomplete  Culture, blood (Routine X 2) w Reflex to ID Panel     Status: None (Preliminary result)   Collection Time: 05/03/19  8:49 PM   Specimen: BLOOD RIGHT HAND  Result Value Ref Range Status   Specimen Description   Final    BLOOD RIGHT HAND Performed at [facility] [address],  [place], [place] [address]    Special Requests   Final    BOTTLES DRAWN AEROBIC ONLY Blood Culture adequate volume Performed at [facility] [address] [place], [place] [address]    Culture   Final    NO GROWTH 2 DAYS Performed at [facility] [address], [place], [place] [address]    Report Status PENDING  Incomplete         Radiology Studies: No results found.      Scheduled Meds: . cholestyramine light  4 g Oral BID  . diphenoxylate-atropine  2 tablet Oral QID  . enoxaparin  90 mg Subcutaneous Q24H  . feeding supplement (ENSURE ENLIVE)  237 mL Oral TID BM  . ferrous sulfate  325 mg Oral Daily  . levothyroxine  150 mcg Oral Daily  . loperamide  2 mg Oral QID  . multivitamin with minerals  1 tablet Oral Daily  . Opium  0.6 mL Oral QID  . polycarbophil  1,250 mg Oral BID  . potassium chloride SA  20 mEq Oral Daily  . predniSONE  5 mg Oral Q breakfast  . sodium chloride flush  10-40 mL Intracatheter Q12H  . temazepam  15 mg Oral QHS  . vitamin B-12  1,000 mcg Oral Daily   Continuous Infusions: . ceFEPime (MAXIPIME) IV 2 g (05/05/19 2313)  . dextrose 5 % and 0.9% NaCl 125 mL/hr at 05/06/19 0600     LOS: 2 days    Time spent: 35 minutes    Elmarie Shiley, MD [facility] Pager [phone]  If 7PM-7AM, please contact night-coverage [email or web address] Password [email or web address] 05/06/2019, 9:28 AM

## 2019-05-06 NOTE — Progress Notes (Signed)
Physical Therapy Treatment Patient Details Name: Nichole Cross MRN: [id number] DOB: 10-11-1941 Today's Date: 05/06/2019    History of Present Illness Nichole Cross is a 78 y.o. 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 Recently hospitalized 04/23/2021 04/30/2019 for bilateral lower extremity DVTs, near syncopal episode secondary to increased ostomy output.Pt admitted for dehydration and weakness    PT Comments    Pt in bed feeling "bad".  Assisted OOB to void when noticed pt passing flutes and a small amount of dark BM via her buttom.  Pt has a colostomy.  Reported to PA and RN.  Assisted with amb but a very limited distance due to weakness/fatigue and reports of dizziness.  Recliner following behind for safety.  Assisted back to bed.   Follow Up Recommendations  SNF;Supervision/Assistance - 24 hour     Equipment Recommendations  None recommended by PT    Recommendations for Other Services       Precautions / Restrictions Precautions Precautions: None    Mobility  Bed Mobility Overal bed mobility: Needs Assistance Bed Mobility: Supine to Sit;Sit to Supine     Supine to sit: Mod assist Sit to supine: Max assist   General bed mobility comments: required increased assist  Transfers Overall transfer level: Needs assistance Equipment used: Rolling walker (2 wheeled) Transfers: Sit to/from Stand Sit to Stand: Max assist;+2 safety/equipment;+2 physical assistance Stand pivot transfers: +2 physical assistance;+2 safety/equipment;Max assist       General transfer comment: + 2 side by side assist off elevated bed, off BSC, off recliner.  Very weak.  Ambulation/Gait Ambulation/Gait assistance: +2 physical assistance;+2 safety/equipment;Mod assist Gait Distance (Feet): 9 Feet Assistive device: Rolling walker (2  wheeled) Gait Pattern/deviations: Step-to pattern Gait velocity: decreased   General Gait Details: very limited distance due to weakness/fatigue  Recliner following for safety.   Stairs             Wheelchair Mobility    Modified Rankin (Stroke Patients Only)       Balance                                            Cognition Arousal/Alertness: Awake/alert Behavior During Therapy: Flat affect                                   General Comments: feels "bad" and "really weak"      Exercises      General Comments        Pertinent Vitals/Pain Pain Assessment: No/denies pain    Home Living                      Prior Function            PT Goals (current goals can now be found in the care plan section) Progress towards PT goals: Progressing toward goals    Frequency    Min 3X/week      PT Plan Discharge plan needs to be updated    Co-evaluation              AM-PAC PT "6 Clicks" Mobility   Outcome Measure  Help needed turning from your back to your side while in a  flat bed without using bedrails?: A Lot Help needed moving from lying on your back to sitting on the side of a flat bed without using bedrails?: A Lot Help needed moving to and from a bed to a chair (including a wheelchair)?: A Lot Help needed standing up from a chair using your arms (e.g., wheelchair or bedside chair)?: A Lot Help needed to walk in hospital room?: A Lot Help needed climbing 3-5 steps with a railing? : Total 6 Click Score: 11    End of Session Equipment Utilized During Treatment: Gait belt Activity Tolerance: Patient limited by fatigue Patient left: in bed;with call bell/phone within reach Nurse Communication: Mobility status PT Visit Diagnosis: Muscle weakness (generalized) (M62.81);Other abnormalities of gait and mobility (R26.89)     Time: 1017-5102 PT Time Calculation (min) (ACUTE ONLY): 17 min  Charges:  $Gait  Training: 8-22 mins                     Nichole Cross  PTA Acute  Rehabilitation Services Pager      [phone] Office      [phone]

## 2019-05-06 NOTE — Telephone Encounter (Signed)
Called and left msg for patient about appts. Advised that if she is still In hospital we will reschedule.

## 2019-05-06 NOTE — Care Management Important Message (Signed)
Important Message  Patient Details  Name: Nichole Cross MRN: [id number] Date of Birth: 1940-11-28   Medicare Important Message Given:  Yes. CMA printed out IM for the Case Manager or LCSW to give to the patient.      Jaimi Belle 05/06/2019, 8:32 AM

## 2019-05-07 DIAGNOSIS — R197 Diarrhea, unspecified: Secondary | ICD-10-CM

## 2019-05-07 LAB — BASIC METABOLIC PANEL
Anion gap: 7 (ref 5–15)
BUN: 16 mg/dL (ref 8–23)
CO2: 19 mmol/L — ABNORMAL LOW (ref 22–32)
Calcium: 7.1 mg/dL — ABNORMAL LOW (ref 8.9–10.3)
Chloride: 111 mmol/L (ref 98–111)
Creatinine, Ser: 0.6 mg/dL (ref 0.44–1.00)
GFR calc Af Amer: >60 mL/min (ref >60)
GFR calc non Af Amer: >60 mL/min (ref >60)
Glucose, Bld: 114 mg/dL — ABNORMAL HIGH (ref 70–99)
Potassium: 3.9 mmol/L (ref 3.5–5.1)
Sodium: 137 mmol/L (ref 135–145)

## 2019-05-07 LAB — GLUCOSE, CAPILLARY
Glucose-Capillary: 94 mg/dL (ref 70–99)
Glucose-Capillary: 105 mg/dL — ABNORMAL HIGH (ref 70–99)
Glucose-Capillary: 120 mg/dL — ABNORMAL HIGH (ref 70–99)
Glucose-Capillary: 157 mg/dL — ABNORMAL HIGH (ref 70–99)
Glucose-Capillary: 130 mg/dL — ABNORMAL HIGH (ref 70–99)
Glucose-Capillary: 109 mg/dL — ABNORMAL HIGH (ref 70–99)

## 2019-05-07 LAB — CBC
HCT: 28.8 % — ABNORMAL LOW (ref 36.0–46.0)
Hemoglobin: 8.8 g/dL — ABNORMAL LOW (ref 12.0–15.0)
MCH: 31.7 pg (ref 26.0–34.0)
MCHC: 30.6 g/dL (ref 30.0–36.0)
MCV: 103.6 fL — ABNORMAL HIGH (ref 80.0–100.0)
Platelets: 223 10*3/uL (ref 150–400)
RBC: 2.78 MIL/uL — ABNORMAL LOW (ref 3.87–5.11)
RDW: 17.8 % — ABNORMAL HIGH (ref 11.5–15.5)
WBC: 5.2 10*3/uL (ref 4.0–10.5)
nRBC: 0 % (ref 0.0–0.2)

## 2019-05-07 MED ORDER — OCTREOTIDE ACETATE 50 MCG/ML IJ SOLN
50.0000 ug | Freq: Two times a day (BID) | INTRAMUSCULAR | Status: DC
  Administered 2019-05-07 – 2019-05-10 (×5): 50 ug via SUBCUTANEOUS
  Filled 2019-05-07 (×8): qty 1

## 2019-05-07 NOTE — Progress Notes (Signed)
PROGRESS NOTE    Nichole Cross  PPJ:[id number] DOB: Jan 08, 1941 DOA: 05/03/2019 PCP: [name], MD    Brief Narrative ; 78 year old with past medical history significant for adenocarcinoma of the distal sigmoid colon poorly differentiated stage II a status post laparoscopic-assisted sigmoid colectomy on 9/26 12/2014 with ongoing chemotherapy, of the anal verge status post laparoscopic transverse colostomy and a Port-A-Cath placement on 4/15 2020 tumor to be yesterday in the descending colon is status post 1 cycle FOLFOX on 04/04/2019, history of renal cell mass suspicious for renal cell carcinoma stable, chronic pain secondary to local recurrence of colon cancer, history of volume depletion secondary to increased ostomy output, recently hospitalized from 04/23/2020 to 04/30/2019 for bilateral lower extremity DVTs, near syncope and increase output from ostomy who was discharged on Imodium, Metamucil, Lomotil.  Patient follow-up with oncology on 6/15 she was given IV fluids and a tincture of opium.  She presents to the ED with significant weakness to the point that she is not able to stand, continue to have increased out food from ostomy.  Patient family also noticed that her urine was black and dark.  Evaluation in the ED urine analysis consistent with UTI, creatinine at 2.2, hemoglobin 9.7.  Covid  negative.  Assessment & Plan:   Principal Problem:   Weakness Active Problems:   Hypothyroidism   Cystitis   Sigmoid colon cancer s/p lap assisted sigmoid colectomy 08/09/15   Anemia, iron deficiency   Adenocarcinoma of colon metastatic to liver (HCC)   Dehydration   Acute lower UTI   Severe dehydration   Leg DVT (deep venous thromboembolism), acute, bilateral (HCC)  1-Generalized weakness likely related to failure to thrive related to increased ostomy output: Continue with IV fluids. PT evaluation. Continue to be weak.   2-Increase ostomy output: Likely secondary to short gut syndrome.  Continue with Imodium, lomotil Continue with Questran and octreotide subcu last dose tonight.  General surgery consulted and sign off.  Started on fibercorn by general surgery.  Discussed with Dr Benay Spice, plan to start opium 6-18 Out put today liquid at 1 L Appreciate surgery evaluation regarding fistula, per Dr Marcello Moores patient with high tumor burden could have drainage from rectum or vagina.  -Patient notice to be more sleepy since she was started on Opium,. Will stop opium. Resume octeotride. Will consult GI.    3-UTI: Change ceftriaxone to cefepime, urine growing citrobacter.   4-Hypothyroidism: Continue with Synthroid.  Acute bilateral lower extremity DVT: Diagnosis 6/8. Continue with Lovenox   Dehydration: Continue with IV fluids.  Renal mass: Under surveillance Adenocarcinoma of the colon with mets to the liver: Patient undergoing chemotherapy status post transverse colostomy and Port-A-Cath placement 03/07/2019, started chemotherapy FOLFOX cycle 1 on 04/04/2019.   Oncology consulted.  Hypoglycemia: Change IV fluid to D5 normal saline. Estimated body mass index is 23.81 kg/m as calculated from the following:   Height as of this encounter: 5\' 7"  (1.702 m).   Weight as of this encounter: 68.9 kg.   DVT prophylaxis: On Lovenox Code Status: DNR Family Communication:  update daughter 48-20 Disposition Plan: Remain in the hospital for IV fluids and treatment of increased output from ostomy, patient is not able to keep up with ostomy losses  Consultants:   Oncology  General surgery   Procedures:   None   Antimicrobials:   Ceftriaxone   Subjective: Out put yesterday 750. She has been more sleepy.  Continue calory count.  Not eating a lot.   Objective: Vitals:  05/06/19 0515 05/06/19 1233 05/06/19 2012 05/07/19 0450  BP: 92/60 (!) 90/59 92/60 (!) 91/59  Pulse: 69 80 87 67  Resp: 18 16 18 18   Temp: 99 F (37.2 C) 98.4 F (36.9 C) 98.7 F (37.1 C) 97.9  F (36.6 C)  TempSrc:  Oral Oral Oral  SpO2: 100% 98% 97% 97%  Weight: 68.9 kg     Height:        Intake/Output Summary (Last 24 hours) at 05/07/2019 1309 Last data filed at 05/07/2019 0700 Gross per 24 hour  Intake 2791.23 ml  Output 925 ml  Net 1866.23 ml   Filed Weights   05/03/19 1753 05/03/19 2117 05/06/19 0515  Weight: 62.2 kg 62 kg 68.9 kg    Examination:  General exam: NAD Respiratory system: CTA Cardiovascular system: S 1, S 2 RRR Gastrointestinal system: BS present, soft, nt ostomy in place,  Central nervous system:alert non focal.  Extremities: symmetric power.  Skin: No rashes.    Data Reviewed: I have personally reviewed following labs and imaging studies  CBC: Recent Labs  Lab 05/02/19 1158 05/03/19 1034 05/04/19 0625 05/05/19 0811 05/06/19 1037 05/07/19 0347  WBC 10.2 7.8 5.1 5.4 4.9 5.2  NEUTROABS 8.5* 6.4  --   --   --   --   HGB 10.9* 9.7* 9.0* 8.7* 8.9* 8.8*  HCT 34.5* 32.1* 29.0* 29.6* 30.3* 28.8*  MCV 99.4 101.3* 103.2* 104.6* 104.1* 103.6*  PLT 380 324 242 259 289 323   Basic Metabolic Panel: Recent Labs  Lab 05/02/19 1158 05/03/19 1034 05/03/19 2049 05/04/19 0625 05/05/19 0811 05/06/19 0348 05/06/19 1037 05/07/19 0347  NA 131* 133*  --  137 136 137  --  137  K 5.0 4.3  --  3.8 3.7 4.1  --  3.9  CL 96* 98  --  105 109 111  --  111  CO2 24 22  --  22 19* 20*  --  19*  GLUCOSE 94 76  --  64* 135* 143*  --  114*  BUN 19 24*  --  22 19 15   --  16  CREATININE 0.72 0.62  --  0.52 0.49 0.49  --  0.60  CALCIUM 7.8* 7.6*  --  7.1* 7.2* 7.2*  --  7.1*  MG 1.7 1.9 1.9  --  1.7  --  2.1  --    GFR: Estimated Creatinine Clearance: 59.1 mL/min (by C-G formula based on SCr of 0.6 mg/dL). Liver Function Tests: Recent Labs  Lab 05/02/19 1158 05/03/19 1034 05/04/19 0625  AST 30 25 19   ALT 18 18 14   ALKPHOS 529* 466* 404*  BILITOT 0.3 0.5 0.4  PROT 5.3* 4.8* 4.3*  ALBUMIN 2.0* 2.1* 1.7*   Recent Labs  Lab 05/03/19 1034  LIPASE  18   Recent Labs  Lab 05/03/19 1032  AMMONIA 68*   Coagulation Profile: No results for input(s): INR, PROTIME in the last 168 hours. Cardiac Enzymes: No results for input(s): CKTOTAL, CKMB, CKMBINDEX, TROPONINI in the last 168 hours. BNP (last 3 results) No results for input(s): PROBNP in the last 8760 hours. HbA1C: No results for input(s): HGBA1C in the last 72 hours. CBG: Recent Labs  Lab 05/06/19 2110 05/06/19 2357 05/07/19 0513 05/07/19 0724 05/07/19 1115  GLUCAP 108* 114* 94 105* 120*   Lipid Profile: No results for input(s): CHOL, HDL, LDLCALC, TRIG, CHOLHDL, LDLDIRECT in the last 72 hours. Thyroid Function Tests: No results for input(s): TSH, T4TOTAL, FREET4, T3FREE, THYROIDAB in the  last 72 hours. Anemia Panel: No results for input(s): VITAMINB12, FOLATE, FERRITIN, TIBC, IRON, RETICCTPCT in the last 72 hours. Sepsis Labs: No results for input(s): PROCALCITON, LATICACIDVEN in the last 168 hours.  Recent Results (from the past 240 hour(s))  Urine culture     Status: Abnormal   Collection Time: 05/03/19  2:25 PM   Specimen: Urine, Random  Result Value Ref Range Status   Specimen Description   Final    URINE, RANDOM Performed at [facility] [address], [place], [place] [address]    Special Requests   Final    NONE Performed at [facility], [place] [address], [place], [place] [address]    Culture >=100,000 COLONIES/mL CITROBACTER FREUNDII (A)  Final   Report Status 05/05/2019 FINAL  Final   Organism ID, Bacteria CITROBACTER FREUNDII (A)  Final      Susceptibility   Citrobacter freundii - MIC*    CEFAZOLIN >=64 RESISTANT Resistant     CEFTRIAXONE 4 SENSITIVE Sensitive     CIPROFLOXACIN >=4 RESISTANT Resistant     GENTAMICIN >=16 RESISTANT Resistant     IMIPENEM <=0.25 SENSITIVE Sensitive     NITROFURANTOIN <=16 SENSITIVE Sensitive     TRIMETH/SULFA >=320 RESISTANT Resistant     PIP/TAZO 16 SENSITIVE Sensitive      * >=100,000 COLONIES/mL CITROBACTER FREUNDII  SARS Coronavirus 2 (CEPHEID - Performed in [facility] hospital lab), Hosp Order     Status: None   Collection Time: 05/03/19  4:31 PM   Specimen: Nasopharyngeal Swab  Result Value Ref Range Status   SARS Coronavirus 2 NEGATIVE NEGATIVE Final    Comment: (NOTE) If result is NEGATIVE SARS-CoV-2 target nucleic acids are NOT DETECTED. The SARS-CoV-2 RNA is generally detectable in upper and lower  respiratory specimens during the acute phase of infection. The lowest  concentration of SARS-CoV-2 viral copies this assay can detect is 250  copies / mL. A negative result does not preclude SARS-CoV-2 infection  and should not be used as the sole basis for treatment or other  patient management decisions.  A negative result may occur with  improper specimen collection / handling, submission of specimen other  than nasopharyngeal swab, presence of viral mutation(s) within the  areas targeted by this assay, and inadequate number of viral copies  (<250 copies / mL). A negative result must be combined with clinical  observations, patient history, and epidemiological information. If result is POSITIVE SARS-CoV-2 target nucleic acids are DETECTED. The SARS-CoV-2 RNA is generally detectable in upper and lower  respiratory specimens dur ing the acute phase of infection.  Positive  results are indicative of active infection with SARS-CoV-2.  Clinical  correlation with patient history and other diagnostic information is  necessary to determine patient infection status.  Positive results do  not rule out bacterial infection or co-infection with other viruses. If result is PRESUMPTIVE POSTIVE SARS-CoV-2 nucleic acids MAY BE PRESENT.   A presumptive positive result was obtained on the submitted specimen  and confirmed on repeat testing.  While 2019 novel coronavirus  (SARS-CoV-2) nucleic acids may be present in the submitted sample  additional confirmatory  testing may be necessary for epidemiological  and / or clinical management purposes  to differentiate between  SARS-CoV-2 and other Sarbecovirus currently known to infect humans.  If clinically indicated additional testing with an alternate test  methodology [phone]) is advised. The SARS-CoV-2 RNA is generally  detectable in upper and lower respiratory sp ecimens during the  acute  phase of infection. The expected result is Negative. Fact Sheet for Patients:  StrictlyIdeas.no Fact Sheet for Healthcare Providers: BankingDealers.co.za This test is not yet approved or cleared by the [place] FDA and has been authorized for detection and/or diagnosis of SARS-CoV-2 by FDA under an Emergency Use Authorization (EUA).  This EUA will remain in effect (meaning this test can be used) for the duration of the COVID-19 declaration under Section 564(b)(1) of the Act, 21 U.S.C. section 360bbb-3(b)(1), unless the authorization is terminated or revoked sooner. Performed at [facility], [place] [address], [place], [place] [address]   Culture, blood (Routine X 2) w Reflex to ID Panel     Status: None (Preliminary result)   Collection Time: 05/03/19  6:34 PM   Specimen: BLOOD RIGHT HAND  Result Value Ref Range Status   Specimen Description   Final    BLOOD RIGHT HAND Performed at [facility], [place] [address] [place], [place] [address]    Special Requests   Final    BOTTLES DRAWN AEROBIC ONLY Blood Culture results may not be optimal due to an inadequate volume of blood received in culture bottles Performed at [facility] [address] [place], [place] [address]    Culture   Final    NO GROWTH 3 DAYS Performed at [facility], [place] [address], [place], [place] [address]    Report Status PENDING  Incomplete  Culture, blood (Routine X 2) w Reflex to ID Panel     Status: None (Preliminary result)    Collection Time: 05/03/19  8:49 PM   Specimen: BLOOD RIGHT HAND  Result Value Ref Range Status   Specimen Description   Final    BLOOD RIGHT HAND Performed at [facility] [address], [place], [place] [address]    Special Requests   Final    BOTTLES DRAWN AEROBIC ONLY Blood Culture adequate volume Performed at [facility] [address], [place], [place] [address]    Culture   Final    NO GROWTH 2 DAYS Performed at [facility] [address], [place], [place] [address]    Report Status PENDING  Incomplete         Radiology Studies: No results found.      Scheduled Meds: . cholestyramine light  4 g Oral BID  . diphenoxylate-atropine  2 tablet Oral QID  . enoxaparin  90 mg Subcutaneous Q24H  . feeding supplement (ENSURE ENLIVE)  237 mL Oral TID BM  . ferrous sulfate  325 mg Oral Daily  . Gerhardt's butt cream   Topical QID  . levothyroxine  150 mcg Oral Daily  . loperamide  2 mg Oral QID  . multivitamin with minerals  1 tablet Oral Daily  . octreotide  50 mcg Subcutaneous Q12H  . polycarbophil  1,250 mg Oral BID  . potassium chloride SA  20 mEq Oral Daily  . predniSONE  5 mg Oral Q breakfast  . sodium chloride flush  10-40 mL Intracatheter Q12H  . temazepam  15 mg Oral QHS  . vitamin B-12  1,000 mcg Oral Daily   Continuous Infusions: . ceFEPime (MAXIPIME) IV Stopped (05/07/19 0140)  . dextrose 5 % and 0.9% NaCl 125 mL/hr at 05/07/19 1257     LOS: 3 days    Time spent: 35 minutes    Elmarie Shiley, MD [facility] Pager [phone]  If 7PM-7AM, please contact  night-coverage [email or web address] Password [email or web address] 05/07/2019, 1:09 PM

## 2019-05-07 NOTE — Plan of Care (Signed)
Patient continues with very poor appetite, drinking sips of clears only.  Refuses Ensure.  Patient very weak, flat affect.  Denies pain.  Medicated for nausea x 1.  Does appear to pass stool when feels like she has to urinate.  CT scan ordered.  Stool sent for GI panel.

## 2019-05-07 NOTE — Progress Notes (Signed)
Got a verbal order from Dr. Fuller Plan to cancel CT orders tonight and re schedule it tomorrow morning. We will continue to monitor.

## 2019-05-07 NOTE — Progress Notes (Signed)
Patient was encouraged to drink contrast but states " I just can't do it right now" . On cal GI Dr. Fuller Plan was paged and notified about CT orders.

## 2019-05-07 NOTE — Consult Note (Addendum)
Referring Provider: Dr. Niel Hummer  Primary Care Physician:  [name], MD Primary Gastroenterologist:  Dr. Zenovia Jarred   Reason for Consultation:  Ostomy with high output   HPI: Nichole Cross is a 78 y.o. female with a past medical  history of anemia, fatty liver, adenocarcinoma of distal sigmoid colon with mets, s/p laparoscopic partial colectomy 08/09/2015, undergoing chemotherapy post transverse colostomy and Port-A-Cath placement in 02/2019. She started chemotherapyFOLFOXcycle 1 on 04/04/2019. History of a renal cell mass suspicious for renal carcinoma which has been stable. DVT 6/7/202 which required hospitalization, she was noted to have increased ostomy output at that time as well. She was discharged on Imodium, Metamucil and Lomotil. She presented to the ED with significant weakness with continued high output from her ostomy.   She was diagnosed with a UTI. Admission Hg 9.7. Creatinine 2.2.  Past Medical History:  Diagnosis Date  . Anemia   . Arthritis    knees  . Blood dyscrasia 2016   microcytic anemia, likely iron deficiency anemia secondary to colon cancer  . Cataracts, bilateral   . Colon cancer ([place]) 06/2015   tubular villous adenoma on sigmoid colon specimen (08/09/15), myltiple tubular adenomas removed on a colonoscopy  11/16/15  . Complication of anesthesia   . Fatty liver   . Headache(784.0)    migraines  . Hypothyroidism   . Liver metastasis ([place])   . Metastasis to kidney ([place])   . Osteoporosis   . PONV (postoperative nausea and vomiting)    NAUSEA  . Renal carcinoma ([place]) 06/2016   ? primary  . Secondary liver cancer ([place]) 06/2016   ? metastasis from colon    Past Surgical History:  Procedure Laterality Date  . ABDOMINAL HYSTERECTOMY  1993   with bilateral BSO  . abdominal tumor  1993  . APPENDECTOMY    . COLON RESECTION N/A 03/02/2019   Procedure: LAPAROSCOPY COLOSTOMY CREATION, SMALL BOWEL RESECTION, BLADDER REPAIR;  Surgeon: Leighton Ruff, MD;  Location: [facility];  Service: General;  Laterality: N/A;  . COLON SURGERY     partial colectomy  . IR GENERIC HISTORICAL  07/01/2016   IR RADIOLOGIST EVAL & MGMT 07/01/2016 Greggory Keen, MD [facility] INTERV RAD  . IR GENERIC HISTORICAL  08/14/2016   IR RADIOLOGIST EVAL & MGMT 08/14/2016 Jacqulynn Cadet, MD [facility] INTER[facility] RAD  . IR RADIOLOGIST EVAL & MGMT  12/24/2017  . IR RADIOLOGIST EVAL & MGMT  04/22/2018  . LAPAROSCOPIC PARTIAL COLECTOMY N/A 08/09/2015   Procedure: LAPAROSCOPIC PARTIAL COLECTOMY;  Surgeon: Jackolyn Confer, MD;  Location: [facility];  Service: General;  Laterality: N/A;  . PARTIAL KNEE ARTHROPLASTY  09/09/2012   Procedure: UNICOMPARTMENTAL KNEE;  Surgeon: Mauri Pole, MD;  Location: [facility];  Service: Orthopedics;  Laterality: Left;  . PORTACATH PLACEMENT Right 03/02/2019   Procedure: INSERTION PORT-A-CATH WITH [place];  Surgeon: Leighton Ruff, MD;  Location: [facility];  Service: General;  Laterality: Right;  . [place] LIVER TUMOR  2018  . XI ROBOTIC ASSISTED LOWER ANTERIOR RESECTION N/A 01/09/2017   Procedure: XI ROBOTIC ASSISTED LOWER ANTERIOR RESECTION;  Surgeon: Leighton Ruff, MD;  Location: [facility];  Service: General;  Laterality: N/A;    Prior to Admission medications   Medication Sig Start Date End Date Taking? Authorizing Provider  diphenoxylate-atropine (LOMOTIL) 2.5-0.025 MG tablet Take 2 tablets by mouth 4 (four) times daily. Patient taking differently: Take 1 tablet by mouth 4 (four) times daily.  04/30/19  Yes Eugenie Filler, MD  enoxaparin (LOVENOX) 80 MG/0.8ML injection Inject 0.8 mLs (80 mg total) into the skin daily for 30 days. 05/01/19 05/31/19 Yes Eugenie Filler, MD  Ensure (ENSURE) Take 237 mLs by mouth 2 (two) times daily.   Yes [provider]  ferrous sulfate 325 (65 FE) MG EC tablet Take 325 mg by mouth daily.    Yes [provider]  levothyroxine (SYNTHROID, LEVOTHROID) 150 MCG tablet Take 1 tablet (150 mcg total) by  mouth daily. 02/25/18  Yes Burns, Claudina Lick, MD  loperamide (IMODIUM) 2 MG capsule Take 1 capsule (2 mg total) by mouth 4 (four) times daily. 04/30/19  Yes Eugenie Filler, MD  Opium 10 MG/ML (1%) TINC Take 0.6 mLs (6 mg total) by mouth 4 (four) times daily. 05/02/19  Yes Ladell Pier, MD  oxyCODONE-acetaminophen (PERCOCET) 5-325 MG tablet Take 1-2 tablets by mouth every 4 (four) hours as needed for severe pain. 04/19/19  Yes Owens Shark, NP  potassium chloride SA (K-DUR) 20 MEQ tablet Take 1 tablet (20 mEq total) by mouth daily. 04/04/19  Yes Owens Shark, NP  predniSONE (DELTASONE) 10 MG tablet Take 1 tablet (10 mg total) by mouth daily with breakfast. 05/01/19  Yes Eugenie Filler, MD  prochlorperazine (COMPAZINE) 10 MG tablet Take 1 tablet (10 mg total) by mouth every 6 (six) hours as needed for nausea or vomiting. 03/30/19  Yes Ladell Pier, MD  temazepam (RESTORIL) 15 MG capsule Take 1 capsule (15 mg total) by mouth at bedtime as needed for sleep. Patient taking differently: Take 15 mg by mouth at bedtime.  04/18/19  Yes Ladell Pier, MD  traMADol (ULTRAM) 50 MG tablet Take 1 tablet (50 mg total) by mouth every 6 (six) hours as needed. Patient taking differently: Take 50 mg by mouth every 6 (six) hours as needed (knee pain).  12/14/18  Yes Owens Shark, NP  vitamin B-12 (CYANOCOBALAMIN) 1000 MCG tablet Take 1 tablet (1,000 mcg total) by mouth daily. 03/30/19  Yes Ladell Pier, MD    Current Facility-Administered Medications  Medication Dose Route Frequency Provider Last Rate Last Dose  . acetaminophen (TYLENOL) tablet 650 mg  650 mg Oral Q6H PRN Eugenie Filler, MD       Or  . acetaminophen (TYLENOL) suppository 650 mg  650 mg Rectal Q6H PRN Eugenie Filler, MD      . albuterol (PROVENTIL) (2.5 MG/3ML) 0.083% nebulizer solution 2.5 mg  2.5 mg Nebulization Q2H PRN Eugenie Filler, MD      . ceFEPIme (MAXIPIME) 2 g in sodium chloride 0.9 % 100 mL IVPB  2 g Intravenous  Q12H Regalado, Belkys A, MD 200 mL/hr at 05/07/19 1310 2 g at 05/07/19 1310  . cholestyramine light (PREVALITE) packet 4 g  4 g Oral BID Eugenie Filler, MD   4 g at 05/06/19 1405  . dextrose 5 %-0.9 % sodium chloride infusion   Intravenous Continuous Regalado, Belkys A, MD 100 mL/hr at 05/07/19 1411    . diphenoxylate-atropine (LOMOTIL) 2.5-0.025 MG per tablet 2 tablet  2 tablet Oral QID Eugenie Filler, MD   2 tablet at 05/07/19 1411  . enoxaparin (LOVENOX) injection 90 mg  90 mg Subcutaneous Q24H Eugenie Filler, MD   90 mg at 05/06/19 2208  . feeding supplement (ENSURE ENLIVE) (ENSURE ENLIVE) liquid 237 mL  237 mL Oral TID BM Regalado, Belkys A, MD   237 mL at 05/06/19 1427  . ferrous sulfate tablet  325 mg  325 mg Oral Daily Eugenie Filler, MD   325 mg at 05/07/19 1411  . Gerhardt's butt cream   Topical QID Jillyn Ledger, PA-C      . ipratropium (ATROVENT) nebulizer solution 0.5 mg  0.5 mg Nebulization Q2H PRN Eugenie Filler, MD      . levothyroxine (SYNTHROID) tablet 150 mcg  150 mcg Oral Daily Eugenie Filler, MD   150 mcg at 05/07/19 0533  . loperamide (IMODIUM) capsule 2 mg  2 mg Oral QID Eugenie Filler, MD   2 mg at 05/07/19 1411  . multivitamin with minerals tablet 1 tablet  1 tablet Oral Daily Regalado, Belkys A, MD   1 tablet at 05/07/19 0934  . octreotide (SANDOSTATIN) injection 50 mcg  50 mcg Subcutaneous Q12H Regalado, Belkys A, MD   50 mcg at 05/07/19 1442  . ondansetron (ZOFRAN) tablet 4 mg  4 mg Oral Q6H PRN Eugenie Filler, MD       Or  . ondansetron [facility]) injection 4 mg  4 mg Intravenous Q6H PRN Eugenie Filler, MD   4 mg at 05/07/19 0930  . oxyCODONE-acetaminophen (PERCOCET/ROXICET) 5-325 MG per tablet 1-2 tablet  1-2 tablet Oral Q4H PRN Eugenie Filler, MD      . polycarbophil (FIBERCON) tablet 1,250 mg  1,250 mg Oral BID Leighton Ruff, MD   7,782 mg at 05/07/19 0934  . potassium chloride SA (K-DUR) CR tablet 20 mEq  20 mEq Oral Daily  Eugenie Filler, MD   20 mEq at 05/07/19 0934  . predniSONE (DELTASONE) tablet 5 mg  5 mg Oral Q breakfast Ladell Pier, MD   5 mg at 05/07/19 0933  . sodium chloride flush (NS) 0.9 % injection 10-40 mL  10-40 mL Intracatheter Q12H Eugenie Filler, MD   10 mL at 05/06/19 2211  . sodium chloride flush (NS) 0.9 % injection 10-40 mL  10-40 mL Intracatheter PRN Eugenie Filler, MD      . temazepam (RESTORIL) capsule 15 mg  15 mg Oral QHS Eugenie Filler, MD   15 mg at 05/06/19 2209  . traMADol (ULTRAM) tablet 50 mg  50 mg Oral Q6H PRN Eugenie Filler, MD      . vitamin B-12 (CYANOCOBALAMIN) tablet 1,000 mcg  1,000 mcg Oral Daily Eugenie Filler, MD   1,000 mcg at 05/07/19 4235    Allergies as of 05/03/2019 - Review Complete 05/03/2019  Allergen Reaction Noted  . Lactose intolerance (gi) Diarrhea 09/01/2018  . Aspirin Itching 08/08/2008  . Morphine and related Rash 10/17/2016    Family History  Problem Relation Age of Onset  . Pancreatic cancer Mother   . Parkinson's disease Mother   . Breast cancer Sister   . Parkinson's disease Brother   . Parkinson's disease Brother   . Colon cancer Neg Hx   . Esophageal cancer Neg Hx   . Liver cancer Neg Hx   . Rectal cancer Neg Hx   . Stomach cancer Neg Hx     Social History   Socioeconomic History  . Marital status: Divorced    Spouse name: Not on file  . Number of children: Not on file  . Years of education: Not on file  . Highest education level: Not on file  Occupational History  . Not on file  Social Needs  . Financial resource strain: Not on file  . Food insecurity    Worry: Not on file  Inability: Not on file  . Transportation needs    Medical: No    Non-medical: No  Tobacco Use  . Smoking status: Never Smoker  . Smokeless tobacco: Never Used  Substance and Sexual Activity  . Alcohol use: No  . Drug use: No  . Sexual activity: Not on file  Lifestyle  . Physical activity    Days per week: Not on  file    Minutes per session: Not on file  . Stress: Not on file  Relationships  . Social Herbalist on phone: Not on file    Gets together: Not on file    Attends religious service: Not on file    Active member of club or organization: Not on file    Attends meetings of clubs or organizations: Not on file    Relationship status: Not on file  . Intimate partner violence    Fear of current or ex partner: Not on file    Emotionally abused: Not on file    Physically abused: Not on file    Forced sexual activity: Not on file  Other Topics Concern  . 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 See HPI. All other systems reviewed are negative.   Physical Exam: Vital signs in last 24 hours: Temp:  [97.9 F (36.6 C)-98.8 F (37.1 C)] 98.8 F (37.1 C) (06/20 1406) Pulse Rate:  [67-100] 100 (06/20 1406) Resp:  [16-18] 16 (06/20 1406) BP: (91-103)/(59-69) 103/69 (06/20 1406) SpO2:  [97 %-98 %] 98 % (06/20 1406) Last BM Date: 05/05/19 General: appear lethargic but alert in NAD Head:  Normocephalic and atraumatic. Eyes:  Sclera clear, no icterus. Conjunctiva pink. Ears:  Normal auditory acuity. Nose:  No deformity, discharge or lesions. Mouth:  No deformity or lesions.   Neck:  Supple. Lungs:  Clear throughout.  Heart:  RRR, no murmur.  Abdomen: Soft, flat, ostomy left mid abdomen, stoma pale, ostomy bag draining light brown liquid stool, no abdominal or peristomal tenderness or obvious hernia. + BS x 4 quads. Rectal:  Deferred  Msk:  Symmetrical without gross deformities. . Pulses:  Normal pulses noted. Extremities:  LEs with 1+ pitting edema. Neurologic:  Alert and  oriented x3;  Generalized weakness,  moves all extremities. Skin:  Intact without significant lesions or rashes.. Psych:  Alert and cooperative. Normal mood and affect.  Intake/Output from previous day: 06/19 0701 - 06/20 0700 In: 2851.2 [P.O.:60; I.V.:2591.2; IV Piggyback:200] Out: 1280 [Urine:555; Stool:725] Intake/Output this shift: Total I/O In: 60 [P.O.:60] Out: 250 [Stool:250]   Ostomy Output 400cc past 34 hours  Lab Results: Recent Labs    05/05/19 0811 05/06/19 1037 05/07/19 0347  WBC 5.4 4.9 5.2  HGB 8.7* 8.9* 8.8*  HCT 29.6* 30.3* 28.8*  PLT 259 289 223   BMET Recent Labs    05/05/19 0811 05/06/19 0348 05/07/19 0347  NA 136 137 137  K 3.7 4.1 3.9  CL 109 111 111  CO2 19* 20* 19*  GLUCOSE 135* 143* 114*  BUN 19 15 16   CREATININE 0.49 0.49 0.60  CALCIUM 7.2* 7.2* 7.1*    IMPRESSION/PLAN:  1. 78 y.o. female with adenocarcinoma of distal  sigmoid colon with mets, s/p laparoscopic partial colectomy 08/09/2015, undergoing chemotherapy post transverse colostomy and Port-A-Cath placement in 02/2019 on Folfox, last infusion approximately 2 weeks ago. She presented to the ED with AKI, with weakness and increased output from her ostomy. Possible short bowel syndrome and chemotherapy related diarrhea. Lomotil, Imodium and octreotide have not significantly reduced her ostomy output. Tincture of opium was tried which resulted in lethargy.  She was evaluated by surgery, no surgical intervention recommended. -Continue with Questran, octreotide, Lomotil and Imodium.  Fiber if tolerated. -abdominal/pelvic CT with oral and IV contrast (Cr. 0.60, GFR > 60).  -nutritional consult if not already done  2.  UTI on cefepime, urine culture growing Citrobacter.  3.  Acute bilateral lower lower extremity DVT, 6/8 on Lovenox.  4. IDA secondary to hx of colon cancer, no signs of active GI bleeding at this time.  Further recommendations per Dr. Nehemiah Settle Dorathy Daft NP  05/07/2019, 3:30 PM      Attending  Physician Note   I have taken a history, examined the patient and reviewed the chart. I agree with the Advanced Practitioner's note, impression and recommendations.  High colostomy output following small bowel resection and transverse loop colostomy in April in patient with stage IV colon cancer. Not clear the length of small bowel resected however diarrhea could be related to short bowel syndrome, SIBO, chemotherapy. R/O enteroenteric or enterocolonic fistula, infectious causes. It is difficult for the patient to hydrate well orally.   Continue Questran, Octreotide, Lomotil, Imodium  DC oral iron, can cause or exacerbate diarrhea in some patients  CT AP GI pathogen panel, C diff  Ongoing, regular use of Pedialyte or similar product  Lucio Edward, MD FACG [phone]

## 2019-05-08 ENCOUNTER — Inpatient Hospital Stay (HOSPITAL_COMMUNITY): Payer: Medicare Other

## 2019-05-08 DIAGNOSIS — R4182 Altered mental status, unspecified: Secondary | ICD-10-CM

## 2019-05-08 DIAGNOSIS — Z515 Encounter for palliative care: Secondary | ICD-10-CM

## 2019-05-08 DIAGNOSIS — Z7189 Other specified counseling: Secondary | ICD-10-CM

## 2019-05-08 LAB — CBC
HCT: 30.5 % — ABNORMAL LOW (ref 36.0–46.0)
Hemoglobin: 9.2 g/dL — ABNORMAL LOW (ref 12.0–15.0)
MCH: 31 pg (ref 26.0–34.0)
MCHC: 30.2 g/dL (ref 30.0–36.0)
MCV: 102.7 fL — ABNORMAL HIGH (ref 80.0–100.0)
Platelets: 264 10*3/uL (ref 150–400)
RBC: 2.97 MIL/uL — ABNORMAL LOW (ref 3.87–5.11)
RDW: 17.8 % — ABNORMAL HIGH (ref 11.5–15.5)
WBC: 7.3 10*3/uL (ref 4.0–10.5)
nRBC: 0 % (ref 0.0–0.2)

## 2019-05-08 LAB — GASTROINTESTINAL PANEL BY PCR, STOOL (REPLACES STOOL CULTURE)

## 2019-05-08 LAB — BLOOD GAS, ARTERIAL
Acid-base deficit: 6.3 mmol/L — ABNORMAL HIGH (ref 0.0–2.0)
Bicarbonate: 16.6 mmol/L — ABNORMAL LOW (ref 20.0–28.0)
Drawn by: 331471
FIO2: 21
O2 Saturation: 95.6 %
Patient temperature: 37
pCO2 arterial: 25.4 mmHg — ABNORMAL LOW (ref 32.0–48.0)
pH, Arterial: 7.431 (ref 7.350–7.450)
pO2, Arterial: 85.5 mmHg (ref 83.0–108.0)

## 2019-05-08 LAB — BASIC METABOLIC PANEL
Anion gap: 7 (ref 5–15)
BUN: 18 mg/dL (ref 8–23)
CO2: 17 mmol/L — ABNORMAL LOW (ref 22–32)
Calcium: 6.9 mg/dL — ABNORMAL LOW (ref 8.9–10.3)
Chloride: 113 mmol/L — ABNORMAL HIGH (ref 98–111)
Creatinine, Ser: 0.53 mg/dL (ref 0.44–1.00)
GFR calc Af Amer: >60 mL/min (ref >60)
GFR calc non Af Amer: >60 mL/min (ref >60)
Glucose, Bld: 124 mg/dL — ABNORMAL HIGH (ref 70–99)
Potassium: 3.5 mmol/L (ref 3.5–5.1)
Sodium: 137 mmol/L (ref 135–145)

## 2019-05-08 LAB — CULTURE, BLOOD (ROUTINE X 2): Culture: NO GROWTH

## 2019-05-08 LAB — GLUCOSE, CAPILLARY
Glucose-Capillary: 104 mg/dL — ABNORMAL HIGH (ref 70–99)
Glucose-Capillary: 110 mg/dL — ABNORMAL HIGH (ref 70–99)
Glucose-Capillary: 112 mg/dL — ABNORMAL HIGH (ref 70–99)
Glucose-Capillary: 114 mg/dL — ABNORMAL HIGH (ref 70–99)
Glucose-Capillary: 123 mg/dL — ABNORMAL HIGH (ref 70–99)
Glucose-Capillary: 95 mg/dL (ref 70–99)
Glucose-Capillary: 96 mg/dL (ref 70–99)

## 2019-05-08 LAB — C DIFFICILE QUICK SCREEN W PCR REFLEX
C Diff antigen: NEGATIVE
C Diff interpretation: NOT DETECTED
C Diff toxin: NEGATIVE

## 2019-05-08 MED ORDER — SODIUM BICARBONATE 8.4 % IV SOLN
INTRAVENOUS | Status: DC
  Filled 2019-05-08: qty 150

## 2019-05-08 MED ORDER — POTASSIUM CHLORIDE 10 MEQ/100ML IV SOLN
10.0000 meq | INTRAVENOUS | Status: AC
  Administered 2019-05-08 (×3): 10 meq via INTRAVENOUS
  Filled 2019-05-08 (×3): qty 100

## 2019-05-08 MED ORDER — SODIUM BICARBONATE 8.4 % IV SOLN
INTRAVENOUS | Status: DC
  Administered 2019-05-08 – 2019-05-09 (×2): via INTRAVENOUS
  Filled 2019-05-08 (×3): qty 50

## 2019-05-08 NOTE — Plan of Care (Signed)
  Problem: Pain Managment: Goal: General experience of comfort will improve Outcome: Progressing   Problem: Safety: Goal: Ability to remain free from injury will improve Outcome: Progressing   

## 2019-05-08 NOTE — Progress Notes (Addendum)
[facility] Progress Note  CC:  Colostomy with high output   Subjective: She is unresponsive, eyes closed, does not follow commands.   Objective:  Vital signs in last 24 hours: Temp:  [97.5 F (36.4 C)-98.8 F (37.1 C)] 98.2 F (36.8 C) (06/21 1038) Pulse Rate:  [68-100] 79 (06/21 1038) Resp:  [12-16] 12 (06/21 0500) BP: (90-104)/(58-69) 104/65 (06/21 1038) SpO2:  [97 %-98 %] 98 % (06/21 1038) Weight:  [72.3 kg] 72.3 kg (06/21 0500) Last BM Date: 05/07/19(ostomy) General: unresponsive, does not follow commands, pupils equal and reactive 110mm Heart: RRR, no murmur. Pulm: Lungs clear, diminished throughout. Abdomen: Soft, nondistended, no rebound or guarding, ostomy with clear yellow urine like fluid, stoma is pale pink.  Extremities:  Without edema. Neurologic:  Alert and  oriented x4;  grossly normal neurologically. Psych:  Alert and cooperative. Normal mood and affect.  Intake/Output from previous day: 06/20 0701 - 06/21 0700 In: 3274.2 [P.O.:660; I.V.:2414.2; IV Piggyback:200] Out: 1700 [Stool:1700] Intake/Output this shift: Total I/O In: 0  Out: 975 [Stool:975]  Lab Results: Recent Labs    05/06/19 1037 05/07/19 0347  WBC 4.9 5.2  HGB 8.9* 8.8*  HCT 30.3* 28.8*  PLT 289 223   BMET Recent Labs    05/06/19 0348 05/07/19 0347 05/08/19 0844  NA 137 137 137  K 4.1 3.9 3.5  CL 111 111 113*  CO2 20* 19* 17*  GLUCOSE 143* 114* 124*  BUN 15 16 18   CREATININE 0.49 0.60 0.53  CALCIUM 7.2* 7.1* 6.9*    Assessment / Plan:   1. 78 y.o. female with adenocarcinoma of distal sigmoid colon with mets, s/p laparoscopic partial colectomy 08/09/2015, undergoing chemotherapy post transverse colostomy and Port-A-Cath placement in 02/2019 on Folfox, last infusion approximately 2 weeks ago. She presented to the ED with AKI, with weakness and increased output from her ostomy. Possible short bowel syndrome and chemotherapy related diarrhea. Lomotil, Imodium  and octreotide have not significantly reduced her ostomy output. Tincture of opium was tried which resulted in lethargy.  She was evaluated by surgery, no surgical intervention recommended. -Abd/Pelvic CT (to rule out enteroenteric or enterocolonic fistula or infectious process) cancelled due to acute mental status changes, to reassess tomorrow -patient not taking po meds d/t AMS, consider increasing Octreotide dose for high ostomy output, further recommendations per Dr. Fuller Plan. BMP remarkably stable today in setting of high ostomy output. -C.diff ordered -IVF per hospitalist   2. Altered Mental Status. Stat head CT ordered by hospitalist.  3.  UTI on cefepime, urine culture growing Citrobacter.  4.  Acute bilateral lower lower extremity DVT, 6/8 on Lovenox.  5. IDA secondary to hx of colon cancer, no signs of active GI bleeding at this time.  6. Anasarca. Albumin 1.7. -CMP in am   Further recommendations per Dr. Fuller Plan    LOS: 4 days   Noralyn Pick NP 05/08/2019, 12:49 PM      Attending Physician Note   I have taken an interval history, reviewed the chart and examined the patient. I agree with the Advanced Practitioner's note, impression and recommendations.   Pt is unresponsive today. Unable to drink oral CT contrast yesterday and she is unable to drink today. Cancel CT AP for how. Check C diff. Further evaluation of AMS per hospitalits with head CT ordered. Discussed mgmt and GOC with hospitalist today. GI will reassess tomorrow. Dr. Lyndel Safe is covering [facility] service starting on Monday.  Lucio Edward, MD FACG [phone])  [phone] ° ° °

## 2019-05-08 NOTE — Consult Note (Signed)
Consultation Note Date: 05/08/2019   Patient Name: Nichole Cross  DOB: [birth year]  MRN: [id number]  Age / Sex: 78 y.o., female  PCP: Nichole Rail, MD Referring Physician: Elmarie Shiley, MD  Reason for Consultation: Establishing goals of care  HPI/Patient Profile: 78 y.o. female  with past medical history of adenocarcinoma of distal sigmoid colon s/p sigmoid colectomy, renal cell mass, high output from ostomy, bilateral DVTs admitted on 05/03/2019 with Weakness, UTI, high output from ostomy, and acute change in mental status.  She is largely unresponsive today and I met with her daughter at bedside.   Clinical Assessment and Goals of Care: I met today with patient's daughter, Nichole Cross.  I introduced palliative care as specialized medical care for people living with serious illness. It focuses on providing relief from the symptoms and stress of a serious illness. The goal is to improve quality of life for both the patient and the family.  We discussed clinical course over the past several months with continued decline in her nutrition, cognition, and functional status.  We discussed acute change in her mental status over the last 24 hours and concern that she will continue to decline.  We discussed difference between a aggressive medical intervention path and a palliative, comfort focused care path.   Concept of Hospice and Palliative Care were discussed.  Questions and concerns addressed.   PMT will continue to support holistically.  SUMMARY OF RECOMMENDATIONS   - DNR/DNI - Continue current interventions including plan for CT head - Family would not like to escalate care in the event of acute decompensation - Daughter would like to see how she does over the next 24 hours and see if Dr. Ammie Cross is able to weigh in tomorrow. - Began discussion regarding potential for hospice if she is not going to  maintain her nutrition/hydration - Plan for f/u meeting with family tomorrow at [facility]:  DNR  Prognosis:   Guarded  Discharge Planning: To Be Determined      Primary Diagnoses: Present on Admission: . Leg DVT (deep venous thromboembolism), acute, bilateral ([facility]) . Hypothyroidism . Cystitis . Sigmoid colon cancer s/p lap assisted sigmoid colectomy 08/09/15 . Anemia, iron deficiency . Adenocarcinoma of colon metastatic to liver ([facility]) . Dehydration . Acute lower UTI . Severe dehydration   I have reviewed the medical record, interviewed the patient and family, and examined the patient. The following aspects are pertinent.  Past Medical History:  Diagnosis Date  . Anemia   . Arthritis    knees  . Blood dyscrasia 2016   microcytic anemia, likely iron deficiency anemia secondary to colon cancer  . Cataracts, bilateral   . Colon cancer ([facility]) 06/2015   tubular villous adenoma on sigmoid colon specimen (08/09/15), myltiple tubular adenomas removed on a colonoscopy  11/16/15  . Complication of anesthesia   . Fatty liver   . Headache(784.0)    migraines  . Hypothyroidism   . Liver metastasis ([facility])   . Metastasis to kidney (  [facility])   . Osteoporosis   . PONV (postoperative nausea and vomiting)    NAUSEA  . Renal carcinoma ([facility]) 06/2016   ? primary  . Secondary liver cancer ([facility]) 06/2016   ? metastasis from colon   Social History   Socioeconomic History  . Marital status: Divorced    Spouse name: Not on file  . Number of children: Not on file  . Years of education: Not on file  . Highest education level: Not on file  Occupational History  . Not on file  Social Needs  . Financial resource strain: Not on file  . Food insecurity    Worry: Not on file    Inability: Not on file  . Transportation needs    Medical: No    Non-medical: No  Tobacco Use  . Smoking status: Never Smoker  . Smokeless tobacco: Never Used  Substance and Sexual  Activity  . Alcohol use: No  . Drug use: No  . Sexual activity: Not on file  Lifestyle  . Physical activity    Days per week: Not on file    Minutes per session: Not on file  . Stress: Not on file  Relationships  . Social Herbalist on phone: Not on file    Gets together: Not on file    Attends religious service: Not on file    Active member of club or organization: Not on file    Attends meetings of clubs or organizations: Not on file    Relationship status: Not on file  Other Topics Concern  . 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 Pancreatic cancer Mother   . Parkinson's disease Mother   . Breast cancer Sister   . Parkinson's disease Brother   . Parkinson's disease Brother   . Colon cancer Neg Hx   . Esophageal cancer Neg Hx   . Liver cancer Neg Hx   . Rectal cancer Neg Hx   . Stomach cancer Neg Hx    Scheduled Meds: . cholestyramine light  4 g Oral BID  . diphenoxylate-atropine  2 tablet Oral QID  . enoxaparin  90 mg Subcutaneous Q24H  . feeding supplement (ENSURE ENLIVE)  237 mL Oral TID BM  . Gerhardt's butt cream   Topical QID  . levothyroxine  150 mcg Oral Daily  . loperamide  2 mg Oral QID  . multivitamin with minerals  1 tablet Oral Daily  . octreotide  50 mcg Subcutaneous Q12H  . polycarbophil  1,250 mg Oral BID  . potassium chloride SA  20 mEq Oral Daily  . predniSONE  5 mg Oral Q breakfast  . sodium chloride flush  10-40 mL Intracatheter Q12H  . temazepam  15 mg Oral QHS  . vitamin B-12  1,000 mcg Oral Daily   Continuous Infusions: .  sodium bicarbonate  infusion 1000 mL 100 mL/hr at 05/08/19 1418   PRN Meds:.acetaminophen **OR** acetaminophen, albuterol, ipratropium,  ondansetron **OR** ondansetron (ZOFRAN) IV, oxyCODONE-acetaminophen, sodium chloride flush, traMADol Medications Prior to Admission:  Prior to Admission medications   Medication Sig Start Date End Date Taking? Authorizing Provider  diphenoxylate-atropine (LOMOTIL) 2.5-0.025 MG tablet Take  2 tablets by mouth 4 (four) times daily. Patient taking differently: Take 1 tablet by mouth 4 (four) times daily.  04/30/19  Yes Eugenie Filler, MD  enoxaparin (LOVENOX) 80 MG/0.8ML injection Inject 0.8 mLs (80 mg total) into the skin daily for 30 days. 05/01/19 05/31/19 Yes Eugenie Filler, MD  Ensure (ENSURE) Take 237 mLs by mouth 2 (two) times daily.   Yes [provider]  ferrous sulfate 325 (65 FE) MG EC tablet Take 325 mg by mouth daily.    Yes [provider]  levothyroxine (SYNTHROID, LEVOTHROID) 150 MCG tablet Take 1 tablet (150 mcg total) by mouth daily. 02/25/18  Yes Burns, Claudina Lick, MD  loperamide (IMODIUM) 2 MG capsule Take 1 capsule (2 mg total) by mouth 4 (four) times daily. 04/30/19  Yes Eugenie Filler, MD  Opium 10 MG/ML (1%) TINC Take 0.6 mLs (6 mg total) by mouth 4 (four) times daily. 05/02/19  Yes Ladell Pier, MD  oxyCODONE-acetaminophen (PERCOCET) 5-325 MG tablet Take 1-2 tablets by mouth every 4 (four) hours as needed for severe pain. 04/19/19  Yes Owens Shark, NP  potassium chloride SA (K-DUR) 20 MEQ tablet Take 1 tablet (20 mEq total) by mouth daily. 04/04/19  Yes Owens Shark, NP  predniSONE (DELTASONE) 10 MG tablet Take 1 tablet (10 mg total) by mouth daily with breakfast. 05/01/19  Yes Eugenie Filler, MD  prochlorperazine (COMPAZINE) 10 MG tablet Take 1 tablet (10 mg total) by mouth every 6 (six) hours as needed for nausea or vomiting. 03/30/19  Yes Ladell Pier, MD  temazepam (RESTORIL) 15 MG capsule Take 1 capsule (15 mg total) by mouth at bedtime as needed for sleep. Patient taking differently: Take 15 mg by mouth at bedtime.  04/18/19  Yes Ladell Pier, MD  traMADol (ULTRAM) 50 MG tablet Take 1 tablet (50 mg total) by mouth every 6 (six) hours as needed. Patient taking differently: Take 50 mg by mouth every 6 (six) hours as needed (knee pain).  12/14/18  Yes Owens Shark, NP  vitamin B-12 (CYANOCOBALAMIN) 1000 MCG tablet Take 1 tablet (1,000 mcg total) by mouth daily. 03/30/19  Yes Ladell Pier, MD   Allergies  Allergen Reactions  . Lactose Intolerance (Gi) Diarrhea    gas  . Aspirin Itching  . Morphine And Related Rash    "veins popped up and they gave me benadryl   Review of Systems Unable to obtain  Physical Exam General: Somnolent, NAD Heart: Regular rate and rhythm. No murmur appreciated. Lungs: Decreased air movement, clear Abdomen: Soft, nondistended, positive bowel sounds.  Ext: No significant edema Skin: Warm and dry Vital Signs: BP 110/60 (BP Location: Left Arm)   Pulse 90   Temp 98 F (36.7 C) (Oral)   Resp 16   Ht '5\' 7"'  (1.702 m)   Wt 72.3 kg   SpO2 99%   BMI 24.96 kg/m  Pain Scale: Faces   Pain Score: 0-No pain   SpO2: SpO2: 99 % O2 Device:SpO2: 99 % O2 Flow Rate: .   IO: Intake/output summary:   Intake/Output Summary (Last 24 hours) at 05/08/2019 2143 Last data filed at 05/08/2019 1958 Gross per 24 hour  Intake 942.61 ml  Output 2700 ml  Net -1757.39 ml    LBM: Last BM Date: 05/08/19(per colostomy) Baseline Weight: Weight: 60.8 kg Most recent weight: Weight: 72.3 kg     Palliative Assessment/Data:   Flowsheet Rows     Most Recent  Value  Intake Tab  Referral Department  Hospitalist  Unit at Time of Referral  Med/Surg Unit  Palliative Care Primary Diagnosis  Cancer  Date Notified  05/08/19  Palliative Care Type  New Palliative care  Reason for referral  Clarify Goals of Care  Date of Admission  05/03/19  Date first seen by Palliative Care  05/08/19  # of days Palliative referral response time  0 Day(s)  # of days IP prior to Palliative referral  5  Clinical Assessment   Palliative Performance Scale Score  10%  Psychosocial & Spiritual Assessment  Palliative Care Outcomes  Patient/Family meeting held?  Yes  Who was at the meeting?  Daughter      Time In: 1120 Time Out: 1235 Time Total: 76 Greater than 50%  of this time was spent counseling and coordinating care related to the above assessment and plan.  Signed by: Micheline Rough, MD   Please contact Palliative Medicine Team phone at [phone] for questions and concerns.  For individual provider: See Shea Evans

## 2019-05-08 NOTE — Progress Notes (Signed)
PROGRESS NOTE    Nichole Cross  UGQ:[id number] DOB: 12/18/1940 DOA: 05/03/2019 PCP: [name], MD    Brief Narrative ; 78 year old with past medical history significant for adenocarcinoma of the distal sigmoid colon poorly differentiated stage II a status post laparoscopic-assisted sigmoid colectomy on 9/26 12/2014 with ongoing chemotherapy, of the anal verge status post laparoscopic transverse colostomy and a Port-A-Cath placement on 4/15 2020 tumor to be yesterday in the descending colon is status post 1 cycle FOLFOX on 04/04/2019, history of renal cell mass suspicious for renal cell carcinoma stable, chronic pain secondary to local recurrence of colon cancer, history of volume depletion secondary to increased ostomy output, recently hospitalized from 04/23/2020 to 04/30/2019 for bilateral lower extremity DVTs, near syncope and increase output from ostomy who was discharged on Imodium, Metamucil, Lomotil.  Patient follow-up with oncology on 6/15 she was given IV fluids and a tincture of opium.  She presents to the ED with significant weakness to the point that she is not able to stand, continue to have increased out food from ostomy.  Patient family also noticed that her urine was black and dark.  Evaluation in the ED urine analysis consistent with UTI, creatinine at 2.2, hemoglobin 9.7.  Covid  negative.  Assessment & Plan:   Principal Problem:   Weakness Active Problems:   Hypothyroidism   Cystitis   Sigmoid colon cancer s/p lap assisted sigmoid colectomy 08/09/15   Anemia, iron deficiency   Adenocarcinoma of colon metastatic to liver ([facility])   Dehydration   Acute lower UTI   Severe dehydration   Leg DVT (deep venous thromboembolism), acute, bilateral ([facility])  1-Generalized weakness likely related to failure to thrive related to increased ostomy output: Continue with IV fluids. PT evaluation. Continue to be weak.   2-Acute metabolic encephalopathy;  Lethargic this am, no  responsive, resist exam upper extremities.  Check CT head.  ABG Palliative care for goals of care.  Will allow daughter to visit.   Increase ostomy output: Likely secondary to short gut syndrome. Continue with Imodium, lomotil Continue with Questran and octreotide subcu last dose tonight.  General surgery consulted and sign off.  Started on fibercorn by general surgery.  Discussed with Dr Benay Spice, plan to start opium 6-18 Out put today liquid at 1 L Appreciate surgery evaluation regarding fistula, per Dr Marcello Moores patient with high tumor burden could have drainage from rectum or vagina.  -Patient notice to be more sleepy since she was started on Opium,. Will stop opium. Resume octeotride. Will consult GI.   Metabolic acidosis.  Change IV fluids to bicarb.    UTI: Change ceftriaxone to cefepime, urine growing citrobacter.   Hypothyroidism: Continue with Synthroid.  Acute bilateral lower extremity DVT: Diagnosis 6/8. Continue with Lovenox  Dehydration: Continue with IV fluids.  Renal mass: Under surveillance Adenocarcinoma of the colon with mets to the liver: Patient undergoing chemotherapy status post transverse colostomy and Port-A-Cath placement 03/07/2019, started chemotherapy FOLFOX cycle 1 on 04/04/2019.   Oncology consulted.  Hypoglycemia: Change IV fluid to D5 normal saline.   Estimated body mass index is 24.96 kg/m as calculated from the following:   Height as of this encounter: 5\' 7"  (1.702 m).   Weight as of this encounter: 72.3 kg.   DVT prophylaxis: On Lovenox Code Status: DNR Family Communication:  update daughter 6-21 Disposition Plan: Remain in the hospital for IV fluids and treatment of increased output from ostomy, patient is not able to keep up with ostomy losses  Consultants:   Oncology  General surgery   Procedures:   None   Antimicrobials:   Ceftriaxone   Subjective: Change in MS today, sleepy, not following command, non responsive   Objective: Vitals:   05/07/19 1406 05/07/19 2039 05/08/19 0500 05/08/19 1038  BP: 103/69 (!) 90/58 103/66 104/65  Pulse: 100 73 68 79  Resp: 16 16 12    Temp: 98.8 F (37.1 C) 98 F (36.7 C) (!) 97.5 F (36.4 C) 98.2 F (36.8 C)  TempSrc: Oral Oral Oral Axillary  SpO2: 98% 98% 97% 98%  Weight:   72.3 kg   Height:        Intake/Output Summary (Last 24 hours) at 05/08/2019 1121 Last data filed at 05/08/2019 0915 Gross per 24 hour  Intake 2539.19 ml  Output 2400 ml  Net 139.19 ml   Filed Weights   05/03/19 2117 05/06/19 0515 05/08/19 0500  Weight: 62 kg 68.9 kg 72.3 kg    Examination:  General exam: Sleepy Respiratory system: CTA Cardiovascular system: S 1, S 2 RRR Gastrointestinal system: BS present, soft, ostomy in place Central nervous system: lethargic, not following command.  Extremities: Symmetric power.   Skin: No rashes.    Data Reviewed: I have personally reviewed following labs and imaging studies  CBC: Recent Labs  Lab 05/02/19 1158 05/03/19 1034 05/04/19 0625 05/05/19 0811 05/06/19 1037 05/07/19 0347  WBC 10.2 7.8 5.1 5.4 4.9 5.2  NEUTROABS 8.5* 6.4  --   --   --   --   HGB 10.9* 9.7* 9.0* 8.7* 8.9* 8.8*  HCT 34.5* 32.1* 29.0* 29.6* 30.3* 28.8*  MCV 99.4 101.3* 103.2* 104.6* 104.1* 103.6*  PLT 380 324 242 259 289 818   Basic Metabolic Panel: Recent Labs  Lab 05/02/19 1158  05/03/19 1034 05/03/19 2049 05/04/19 0625 05/05/19 0811 05/06/19 0348 05/06/19 1037 05/07/19 0347 05/08/19 0844  NA 131*  --  133*  --  137 136 137  --  137 137  K 5.0  --  4.3  --  3.8 3.7 4.1  --  3.9 3.5  CL 96*  --  98  --  105 109 111  --  111 113*  CO2 24  --  22  --  22 19* 20*  --  19* 17*  GLUCOSE 94  --  76  --  64* 135* 143*  --  114* 124*  BUN 19  --  24*  --  22 19 15   --  16 18  CREATININE 0.72   < > 0.62  --  0.52 0.49 0.49  --  0.60 0.53  CALCIUM 7.8*  --  7.6*  --  7.1* 7.2* 7.2*  --  7.1* 6.9*  MG 1.7  --  1.9 1.9  --  1.7  --  2.1  --   --     < > = values in this interval not displayed.   GFR: Estimated Creatinine Clearance: 59.1 mL/min (by C-G formula based on SCr of 0.53 mg/dL). Liver Function Tests: Recent Labs  Lab 05/02/19 1158 05/03/19 1034 05/04/19 0625  AST 30 25 19   ALT 18 18 14   ALKPHOS 529* 466* 404*  BILITOT 0.3 0.5 0.4  PROT 5.3* 4.8* 4.3*  ALBUMIN 2.0* 2.1* 1.7*   Recent Labs  Lab 05/03/19 1034  LIPASE 18   Recent Labs  Lab 05/03/19 1032  AMMONIA 68*   Coagulation Profile: No results for input(s): INR, PROTIME in the last 168 hours. Cardiac Enzymes: No results  for input(s): CKTOTAL, CKMB, CKMBINDEX, TROPONINI in the last 168 hours. BNP (last 3 results) No results for input(s): PROBNP in the last 8760 hours. HbA1C: No results for input(s): HGBA1C in the last 72 hours. CBG: Recent Labs  Lab 05/07/19 2041 05/07/19 2319 05/08/19 0505 05/08/19 0913 05/08/19 1047  GLUCAP 130* 109* 112* 110* 104*   Lipid Profile: No results for input(s): CHOL, HDL, LDLCALC, TRIG, CHOLHDL, LDLDIRECT in the last 72 hours. Thyroid Function Tests: No results for input(s): TSH, T4TOTAL, FREET4, T3FREE, THYROIDAB in the last 72 hours. Anemia Panel: No results for input(s): VITAMINB12, FOLATE, FERRITIN, TIBC, IRON, RETICCTPCT in the last 72 hours. Sepsis Labs: No results for input(s): PROCALCITON, LATICACIDVEN in the last 168 hours.  Recent Results (from the past 240 hour(s))  Urine culture     Status: Abnormal   Collection Time: 05/03/19  2:25 PM   Specimen: Urine, Random  Result Value Ref Range Status   Specimen Description   Final    URINE, RANDOM Performed at [facility] [address], [place], [place] [address]    Special Requests   Final    NONE Performed at [facility], [place] [address] [place], [place] [address]    Culture >=100,000 COLONIES/mL CITROBACTER FREUNDII (A)  Final   Report Status 05/05/2019 FINAL  Final   Organism ID, Bacteria CITROBACTER  FREUNDII (A)  Final      Susceptibility   Citrobacter freundii - MIC*    CEFAZOLIN >=64 RESISTANT Resistant     CEFTRIAXONE 4 SENSITIVE Sensitive     CIPROFLOXACIN >=4 RESISTANT Resistant     GENTAMICIN >=16 RESISTANT Resistant     IMIPENEM <=0.25 SENSITIVE Sensitive     NITROFURANTOIN <=16 SENSITIVE Sensitive     TRIMETH/SULFA >=320 RESISTANT Resistant     PIP/TAZO 16 SENSITIVE Sensitive     * >=100,000 COLONIES/mL CITROBACTER FREUNDII  SARS Coronavirus 2 (CEPHEID - Performed in [facility] hospital lab), Hosp Order     Status: None   Collection Time: 05/03/19  4:31 PM   Specimen: Nasopharyngeal Swab  Result Value Ref Range Status   SARS Coronavirus 2 NEGATIVE NEGATIVE Final    Comment: (NOTE) If result is NEGATIVE SARS-CoV-2 target nucleic acids are NOT DETECTED. The SARS-CoV-2 RNA is generally detectable in upper and lower  respiratory specimens during the acute phase of infection. The lowest  concentration of SARS-CoV-2 viral copies this assay can detect is 250  copies / mL. A negative result does not preclude SARS-CoV-2 infection  and should not be used as the sole basis for treatment or other  patient management decisions.  A negative result may occur with  improper specimen collection / handling, submission of specimen other  than nasopharyngeal swab, presence of viral mutation(s) within the  areas targeted by this assay, and inadequate number of viral copies  (<250 copies / mL). A negative result must be combined with clinical  observations, patient history, and epidemiological information. If result is POSITIVE SARS-CoV-2 target nucleic acids are DETECTED. The SARS-CoV-2 RNA is generally detectable in upper and lower  respiratory specimens dur ing the acute phase of infection.  Positive  results are indicative of active infection with SARS-CoV-2.  Clinical  correlation with patient history and other diagnostic information is  necessary to determine patient infection  status.  Positive results do  not rule out bacterial infection or co-infection with other viruses. If result is PRESUMPTIVE POSTIVE SARS-CoV-2 nucleic acids MAY BE PRESENT.   A presumptive  positive result was obtained on the submitted specimen  and confirmed on repeat testing.  While 2019 novel coronavirus  (SARS-CoV-2) nucleic acids may be present in the submitted sample  additional confirmatory testing may be necessary for epidemiological  and / or clinical management purposes  to differentiate between  SARS-CoV-2 and other Sarbecovirus currently known to infect humans.  If clinically indicated additional testing with an alternate test  methodology [phone]) is advised. The SARS-CoV-2 RNA is generally  detectable in upper and lower respiratory sp ecimens during the acute  phase of infection. The expected result is Negative. Fact Sheet for Patients:  StrictlyIdeas.no Fact Sheet for Healthcare Providers: BankingDealers.co.za This test is not yet approved or cleared by the [place] FDA and has been authorized for detection and/or diagnosis of SARS-CoV-2 by FDA under an Emergency Use Authorization (EUA).  This EUA will remain in effect (meaning this test can be used) for the duration of the COVID-19 declaration under Section 564(b)(1) of the Act, 21 U.S.C. section 360bbb-3(b)(1), unless the authorization is terminated or revoked sooner. Performed at [facility], [place] [address], [place], [place] [address]   Culture, blood (Routine X 2) w Reflex to ID Panel     Status: None (Preliminary result)   Collection Time: 05/03/19  6:34 PM   Specimen: BLOOD RIGHT HAND  Result Value Ref Range Status   Specimen Description   Final    BLOOD RIGHT HAND Performed at [facility], [place] [address] [place], [place] [address]    Special Requests   Final    BOTTLES DRAWN AEROBIC ONLY Blood Culture results may not be optimal  due to an inadequate volume of blood received in culture bottles Performed at [facility] [address], [place], [place] [address]    Culture   Final    NO GROWTH 4 DAYS Performed at [facility], [place] [address], [place], [place] [address]    Report Status PENDING  Incomplete  Culture, blood (Routine X 2) w Reflex to ID Panel     Status: None (Preliminary result)   Collection Time: 05/03/19  8:49 PM   Specimen: BLOOD RIGHT HAND  Result Value Ref Range Status   Specimen Description   Final    BLOOD RIGHT HAND Performed at [facility] [address] [place], [place] [address]    Special Requests   Final    BOTTLES DRAWN AEROBIC ONLY Blood Culture adequate volume Performed at [facility] [address] [place], [place] [address]    Culture   Final    NO GROWTH 3 DAYS Performed at [facility], [place] [address], [place],  [address]    Report Status PENDING  Incomplete         Radiology Studies: No results found.      Scheduled Meds: . cholestyramine light  4 g Oral BID  . diphenoxylate-atropine  2 tablet Oral QID  . enoxaparin  90 mg Subcutaneous Q24H  . feeding supplement (ENSURE ENLIVE)  237 mL Oral TID BM  . Gerhardt's butt cream   Topical QID  . levothyroxine  150 mcg Oral Daily  . loperamide  2 mg Oral QID  . multivitamin with minerals  1 tablet Oral Daily  . octreotide  50 mcg Subcutaneous Q12H  . polycarbophil  1,250 mg Oral BID  . potassium chloride SA  20 mEq Oral Daily  . predniSONE  5 mg Oral Q  breakfast  . sodium chloride flush  10-40 mL Intracatheter Q12H  . temazepam  15 mg Oral QHS  . vitamin B-12  1,000 mcg Oral Daily   Continuous Infusions: . potassium chloride    .  sodium bicarbonate  infusion 1000 mL       LOS: 4 days    Time spent: 35 minutes    Elmarie Shiley, MD [facility] Pager [phone]  If 7PM-7AM, please contact night-coverage  [email or web address] Password [email or web address] 05/08/2019, 11:21 AM

## 2019-05-08 NOTE — Progress Notes (Signed)
OT Cancellation Note  Patient Details Name: Nichole Cross MRN: [id number] DOB: 1941/01/09   Cancelled Treatment:    Reason Eval/Treat Not Completed: Fatigue/lethargy limiting ability to participate. Pt is not really waking up for nursing:  Moaning with care. Will check another day  Felicita Nuncio 05/08/2019, 10:09 AM  Lesle Chris, OTR/L Acute Rehabilitation Services [phone] pager [phone] office 05/08/2019

## 2019-05-09 DIAGNOSIS — I82451 Acute embolism and thrombosis of right peroneal vein: Secondary | ICD-10-CM

## 2019-05-09 DIAGNOSIS — R531 Weakness: Secondary | ICD-10-CM

## 2019-05-09 DIAGNOSIS — I82452 Acute embolism and thrombosis of left peroneal vein: Secondary | ICD-10-CM

## 2019-05-09 LAB — BASIC METABOLIC PANEL
Anion gap: 10 (ref 5–15)
BUN: 19 mg/dL (ref 8–23)
CO2: 16 mmol/L — ABNORMAL LOW (ref 22–32)
Calcium: 7 mg/dL — ABNORMAL LOW (ref 8.9–10.3)
Chloride: 111 mmol/L (ref 98–111)
Creatinine, Ser: 0.57 mg/dL (ref 0.44–1.00)
GFR calc Af Amer: >60 mL/min (ref >60)
GFR calc non Af Amer: >60 mL/min (ref >60)
Glucose, Bld: 137 mg/dL — ABNORMAL HIGH (ref 70–99)
Potassium: 3.5 mmol/L (ref 3.5–5.1)
Sodium: 137 mmol/L (ref 135–145)

## 2019-05-09 LAB — CBC
HCT: 30.7 % — ABNORMAL LOW (ref 36.0–46.0)
Hemoglobin: 9.1 g/dL — ABNORMAL LOW (ref 12.0–15.0)
MCH: 30.3 pg (ref 26.0–34.0)
MCHC: 29.6 g/dL — ABNORMAL LOW (ref 30.0–36.0)
MCV: 102.3 fL — ABNORMAL HIGH (ref 80.0–100.0)
Platelets: 266 10*3/uL (ref 150–400)
RBC: 3 MIL/uL — ABNORMAL LOW (ref 3.87–5.11)
RDW: 17.8 % — ABNORMAL HIGH (ref 11.5–15.5)
WBC: 10.3 10*3/uL (ref 4.0–10.5)
nRBC: 0 % (ref 0.0–0.2)

## 2019-05-09 LAB — GLUCOSE, CAPILLARY
Glucose-Capillary: 100 mg/dL — ABNORMAL HIGH (ref 70–99)
Glucose-Capillary: 101 mg/dL — ABNORMAL HIGH (ref 70–99)
Glucose-Capillary: 103 mg/dL — ABNORMAL HIGH (ref 70–99)
Glucose-Capillary: 110 mg/dL — ABNORMAL HIGH (ref 70–99)
Glucose-Capillary: 116 mg/dL — ABNORMAL HIGH (ref 70–99)
Glucose-Capillary: 119 mg/dL — ABNORMAL HIGH (ref 70–99)

## 2019-05-09 LAB — CULTURE, BLOOD (ROUTINE X 2)
Culture: NO GROWTH
Special Requests: ADEQUATE

## 2019-05-09 MED ORDER — BOOST / RESOURCE BREEZE PO LIQD CUSTOM: 1.0000 | Freq: Two times a day (BID) | ORAL | Status: DC

## 2019-05-09 MED ORDER — POTASSIUM CHLORIDE 10 MEQ/100ML IV SOLN
10.0000 meq | INTRAVENOUS | Status: AC
  Administered 2019-05-09 (×3): 10 meq via INTRAVENOUS
  Filled 2019-05-09: qty 100

## 2019-05-09 MED ORDER — ALBUTEROL SULFATE (2.5 MG/3ML) 0.083% IN NEBU: 2.5000 mg | INHALATION_SOLUTION | RESPIRATORY_TRACT | Status: DC | PRN

## 2019-05-09 MED ORDER — POTASSIUM CHLORIDE 10 MEQ/100ML IV SOLN
INTRAVENOUS | Status: AC
  Administered 2019-05-09: 10 meq via INTRAVENOUS
  Filled 2019-05-09: qty 100

## 2019-05-09 MED ORDER — SODIUM BICARBONATE 8.4 % IV SOLN
INTRAVENOUS | Status: DC
  Administered 2019-05-09 – 2019-05-10 (×4): via INTRAVENOUS
  Filled 2019-05-09 (×4): qty 150

## 2019-05-09 MED ORDER — ENSURE ENLIVE PO LIQD: 237.0000 mL | ORAL | Status: DC

## 2019-05-09 NOTE — TOC Progression Note (Signed)
Transition of Care [facility]) - Progression Note    Patient Details  Name: Nichole Cross MRN: [id number] Date of Birth: 1941-03-13  Transition of Care [facility]) CM/SW Contact  Angelynn Lemus, Juliann Pulse, RN Phone Number: 05/09/2019, 2:14 PM  Clinical Narrative: Ownership: For-Profit Date Certified: 41/96/2229(798) [phone]   [facility] [place] Ownership: For-Profit Date Certified: 74/06/1447(185) [phone]   [facility] [place] Ownership: Non-Profit Date Certified: 26/37/8588(502) [phone]   [facility] [place] Ownership: Non-Profit Date Certified: 77/41/2878(676) [phone]   [facility] Ownership: Non-Profit Date Certified: 96/28/3662(947) [place] Ownership: For-Profit Date Certified: 65/46/5035(465) [phone]   [place] Ownership: For-Profit Date Certified: 70/11/7492(496) [phone]   End of Results Hospice agencies that serve [phone] (Some phone numbers provided may      Expected Discharge Plan: [place] Barriers to Discharge: Continued Medical Work up  Expected Discharge Plan and Services Expected Discharge Plan: [place]   Discharge Planning Services: CM Consult Post Acute Care Choice: [place] arrangements for the past 2 months: Single Family Home                                       Social Determinants of Health (SDOH) Interventions    Readmission Risk Interventions Readmission Risk Prevention Plan 04/27/2019  Transportation Screening Complete  Medication Review Press [employer]) Complete  HRI or [place] Complete  SW Recovery Care/Counseling Consult Complete  [place] Not Applicable  Some recent data might be hidden

## 2019-05-09 NOTE — Plan of Care (Signed)
  Problem: Pain Managment: Goal: General experience of comfort will improve Outcome: Progressing   Problem: Safety: Goal: Ability to remain free from injury will improve Outcome: Progressing   

## 2019-05-09 NOTE — Progress Notes (Signed)
PT Cancellation Note  Patient Details Name: Nichole Cross MRN: [id number] DOB: [birth year]   Cancelled Treatment:     per chart review pt has had a medical decline. Pt remains unresponsive.   [facility].  Will hold off any Physical Therapy and consult LPT.  Rica Koyanagi  PTA Acute  Rehabilitation Services Pager      [phone] Office      [phone]

## 2019-05-09 NOTE — Care Management Important Message (Signed)
Important Message  Patient Details IM Letter given to [name] RN to present to the Patient Name: Nichole Cross MRN: [id number] Date of Birth: 14-Jun-1941   Medicare Important Message Given:  Yes     [name] 05/09/2019, 2:18 PM

## 2019-05-09 NOTE — Progress Notes (Signed)
PT Cancellation Note  Patient Details Name: Nichole Cross MRN: [id number] DOB: Dec 12, 1940   Cancelled Treatment:      After chart reviewing, seeing Dr. Kirstie Mirza note and discussing with patients nurse, PT sill sign off at this time. Per nurse pt has not been responsive to voice or sternal rubs today. Please reorder if medical status changes and skilled PT is appropriate, we would be glad to come and reasess.  Thank you   Clide Dales 05/09/2019, 5:40 PM  Clide Dales, PT Acute Rehabilitation Services Pager: [phone] Office: [phone] 05/09/2019

## 2019-05-09 NOTE — Progress Notes (Signed)
PROGRESS NOTE    Nichole Cross  JJO:[id number] DOB: 1941/10/26 DOA: 05/03/2019 PCP: [name], MD    Brief Narrative ; 78 year old with past medical history significant for adenocarcinoma of the distal sigmoid colon poorly differentiated stage II a status post laparoscopic-assisted sigmoid colectomy on 9/26 12/2014 with ongoing chemotherapy, of the anal verge status post laparoscopic transverse colostomy and a Port-A-Cath placement on 4/15 2020 tumor to be yesterday in the descending colon is status post 1 cycle FOLFOX on 04/04/2019, history of renal cell mass suspicious for renal cell carcinoma stable, chronic pain secondary to local recurrence of colon cancer, history of volume depletion secondary to increased ostomy output, recently hospitalized from 04/23/2020 to 04/30/2019 for bilateral lower extremity DVTs, near syncope and increase output from ostomy who was discharged on Imodium, Metamucil, Lomotil.  Patient follow-up with oncology on 6/15 she was given IV fluids and a tincture of opium.  She presents to the ED with significant weakness to the point that she is not able to stand, continue to have increased out food from ostomy.  Patient family also noticed that her urine was black and dark.  Evaluation in the ED urine analysis consistent with UTI, creatinine at 2.2, hemoglobin 9.7.  Covid  negative.  Assessment & Plan:   Principal Problem:   Weakness Active Problems:   Hypothyroidism   Cystitis   Sigmoid colon cancer s/p lap assisted sigmoid colectomy 08/09/15   Anemia, iron deficiency   Adenocarcinoma of colon metastatic to liver (HCC)   Dehydration   Acute lower UTI   Severe dehydration   Leg DVT (deep venous thromboembolism), acute, bilateral (HCC)  1-Generalized weakness likely related to failure to thrive related to increased ostomy output: Continue with IV fluids. PT evaluation. Continue to be weak.   2-Acute metabolic encephalopathy;  She is obtunded today, non  responsive.  CT head no acute intracranial abnormalities.  ABG; no increase CO2.  Palliative care for goals of care.  Unclear if related to delirium, vs stroke vs meningeal mets, patient with poor prognosis. Plan for palliative care meeting with family.   Increase ostomy output: Likely secondary to short gut syndrome. Continue with Imodium, lomotil Continue with Questran and octreotide subcu last dose tonight.  General surgery consulted and sign off.  Started on fibercorn by general surgery.  Discussed with Dr Benay Spice, plan to start opium 6-18--discontinue due to AMS.  Appreciate surgery evaluation regarding fistula, per Dr Marcello Moores patient with high tumor burden could have drainage from rectum or vagina.  -Opium stopped due to AMS. On  octeotride. GI was consulted.  -Patient has received multiples drugs therapy for increase out put without benefit.   Metabolic acidosis.  On IV bicarb Gtt   UTI: Change ceftriaxone to cefepime, urine growing citrobacter.   Hypothyroidism: Continue with Synthroid.  Acute bilateral lower extremity DVT: Diagnosis 6/8. Continue with Lovenox  Dehydration: Continue with IV fluids.  Renal mass: Under surveillance Adenocarcinoma of the colon with mets to the liver: Patient undergoing chemotherapy status post transverse colostomy and Port-A-Cath placement 03/07/2019, started chemotherapy FOLFOX cycle 1 on 04/04/2019.   Oncology consulted.  Hypoglycemia: Change IV fluid to D5 normal saline.   Estimated body mass index is 24.86 kg/m as calculated from the following:   Height as of this encounter: 5\' 7"  (1.702 m).   Weight as of this encounter: 72 kg.   DVT prophylaxis: On Lovenox Code Status: DNR Family Communication:  update daughter 6-21. Disposition Plan: goals of care today.  Consultants:   Oncology  General surgery   Procedures:   None   Antimicrobials:   Ceftriaxone   Subjective: More lethargic today. Obtunded.    Objective: Vitals:   05/09/19 0500 05/09/19 0550 05/09/19 0813 05/09/19 1146  BP:  (!) 110/59 103/69 109/70  Pulse:  86 82 89  Resp:  16 16 16   Temp:  97.7 F (36.5 C) 99.4 F (37.4 C) 98.7 F (37.1 C)  TempSrc:  Oral Oral Oral  SpO2:  96% 94% 95%  Weight: 72 kg     Height:        Intake/Output Summary (Last 24 hours) at 05/09/2019 1354 Last data filed at 05/09/2019 0600 Gross per 24 hour  Intake 1417.32 ml  Output 575 ml  Net 842.32 ml   Filed Weights   05/06/19 0515 05/08/19 0500 05/09/19 0500  Weight: 68.9 kg 72.3 kg 72 kg    Examination:  General exam: Lethargic Respiratory system: CTA Cardiovascular system: S 1, S 2 RRR Gastrointestinal system: BS present, ostomy in place.  Central nervous system:obtunded,  Extremities: trace edema Skin: No rashes.    Data Reviewed: I have personally reviewed following labs and imaging studies  CBC: Recent Labs  Lab 05/03/19 1034  05/05/19 0811 05/06/19 1037 05/07/19 0347 05/08/19 1314 05/09/19 0429  WBC 7.8   < > 5.4 4.9 5.2 7.3 10.3  NEUTROABS 6.4  --   --   --   --   --   --   HGB 9.7*   < > 8.7* 8.9* 8.8* 9.2* 9.1*  HCT 32.1*   < > 29.6* 30.3* 28.8* 30.5* 30.7*  MCV 101.3*   < > 104.6* 104.1* 103.6* 102.7* 102.3*  PLT 324   < > 259 289 223 264 266   < > = values in this interval not displayed.   Basic Metabolic Panel: Recent Labs  Lab 05/03/19 1034 05/03/19 2049  05/05/19 0811 05/06/19 0348 05/06/19 1037 05/07/19 0347 05/08/19 0844 05/09/19 0429  NA 133*  --    < > 136 137  --  137 137 137  K 4.3  --    < > 3.7 4.1  --  3.9 3.5 3.5  CL 98  --    < > 109 111  --  111 113* 111  CO2 22  --    < > 19* 20*  --  19* 17* 16*  GLUCOSE 76  --    < > 135* 143*  --  114* 124* 137*  BUN 24*  --    < > 19 15  --  16 18 19   CREATININE 0.62  --    < > 0.49 0.49  --  0.60 0.53 0.57  CALCIUM 7.6*  --    < > 7.2* 7.2*  --  7.1* 6.9* 7.0*  MG 1.9 1.9  --  1.7  --  2.1  --   --   --    < > = values in this interval  not displayed.   GFR: Estimated Creatinine Clearance: 59.1 mL/min (by C-G formula based on SCr of 0.57 mg/dL). Liver Function Tests: Recent Labs  Lab 05/03/19 1034 05/04/19 0625  AST 25 19  ALT 18 14  ALKPHOS 466* 404*  BILITOT 0.5 0.4  PROT 4.8* 4.3*  ALBUMIN 2.1* 1.7*   Recent Labs  Lab 05/03/19 1034  LIPASE 18   Recent Labs  Lab 05/03/19 1032  AMMONIA 68*   Coagulation Profile: No results for input(s): INR,  PROTIME in the last 168 hours. Cardiac Enzymes: No results for input(s): CKTOTAL, CKMB, CKMBINDEX, TROPONINI in the last 168 hours. BNP (last 3 results) No results for input(s): PROBNP in the last 8760 hours. HbA1C: No results for input(s): HGBA1C in the last 72 hours. CBG: Recent Labs  Lab 05/08/19 1940 05/08/19 2342 05/09/19 0319 05/09/19 0805 05/09/19 1137  GLUCAP 123* 114* 116* 103* 101*   Lipid Profile: No results for input(s): CHOL, HDL, LDLCALC, TRIG, CHOLHDL, LDLDIRECT in the last 72 hours. Thyroid Function Tests: No results for input(s): TSH, T4TOTAL, FREET4, T3FREE, THYROIDAB in the last 72 hours. Anemia Panel: No results for input(s): VITAMINB12, FOLATE, FERRITIN, TIBC, IRON, RETICCTPCT in the last 72 hours. Sepsis Labs: No results for input(s): PROCALCITON, LATICACIDVEN in the last 168 hours.  Recent Results (from the past 240 hour(s))  Urine culture     Status: Abnormal   Collection Time: 05/03/19  2:25 PM   Specimen: Urine, Random  Result Value Ref Range Status   Specimen Description   Final    URINE, RANDOM Performed at [facility] [address] [place], [place] [address]    Special Requests   Final    NONE Performed at [facility], [place] [address] [place], [place] [address]    Culture >=100,000 COLONIES/mL CITROBACTER FREUNDII (A)  Final   Report Status 05/05/2019 FINAL  Final   Organism ID, Bacteria CITROBACTER FREUNDII (A)  Final      Susceptibility   Citrobacter freundii - MIC*     CEFAZOLIN >=64 RESISTANT Resistant     CEFTRIAXONE 4 SENSITIVE Sensitive     CIPROFLOXACIN >=4 RESISTANT Resistant     GENTAMICIN >=16 RESISTANT Resistant     IMIPENEM <=0.25 SENSITIVE Sensitive     NITROFURANTOIN <=16 SENSITIVE Sensitive     TRIMETH/SULFA >=320 RESISTANT Resistant     PIP/TAZO 16 SENSITIVE Sensitive     * >=100,000 COLONIES/mL CITROBACTER FREUNDII  SARS Coronavirus 2 (CEPHEID - Performed in [place] hospital lab), Hosp Order     Status: None   Collection Time: 05/03/19  4:31 PM   Specimen: Nasopharyngeal Swab  Result Value Ref Range Status   SARS Coronavirus 2 NEGATIVE NEGATIVE Final    Comment: (NOTE) If result is NEGATIVE SARS-CoV-2 target nucleic acids are NOT DETECTED. The SARS-CoV-2 RNA is generally detectable in upper and lower  respiratory specimens during the acute phase of infection. The lowest  concentration of SARS-CoV-2 viral copies this assay can detect is 250  copies / mL. A negative result does not preclude SARS-CoV-2 infection  and should not be used as the sole basis for treatment or other  patient management decisions.  A negative result may occur with  improper specimen collection / handling, submission of specimen other  than nasopharyngeal swab, presence of viral mutation(s) within the  areas targeted by this assay, and inadequate number of viral copies  (<250 copies / mL). A negative result must be combined with clinical  observations, patient history, and epidemiological information. If result is POSITIVE SARS-CoV-2 target nucleic acids are DETECTED. The SARS-CoV-2 RNA is generally detectable in upper and lower  respiratory specimens dur ing the acute phase of infection.  Positive  results are indicative of active infection with SARS-CoV-2.  Clinical  correlation with patient history and other diagnostic information is  necessary to determine patient infection status.  Positive results do  not rule out bacterial infection or  co-infection with other viruses. If result is PRESUMPTIVE POSTIVE  SARS-CoV-2 nucleic acids MAY BE PRESENT.   A presumptive positive result was obtained on the submitted specimen  and confirmed on repeat testing.  While 2019 novel coronavirus  (SARS-CoV-2) nucleic acids may be present in the submitted sample  additional confirmatory testing may be necessary for epidemiological  and / or clinical management purposes  to differentiate between  SARS-CoV-2 and other Sarbecovirus currently known to infect humans.  If clinically indicated additional testing with an alternate test  methodology [phone]) is advised. The SARS-CoV-2 RNA is generally  detectable in upper and lower respiratory sp ecimens during the acute  phase of infection. The expected result is Negative. Fact Sheet for Patients:  StrictlyIdeas.no Fact Sheet for Healthcare Providers: BankingDealers.co.za This test is not yet approved or cleared by the [place] FDA and has been authorized for detection and/or diagnosis of SARS-CoV-2 by FDA under an Emergency Use Authorization (EUA).  This EUA will remain in effect (meaning this test can be used) for the duration of the COVID-19 declaration under Section 564(b)(1) of the Act, 21 U.S.C. section 360bbb-3(b)(1), unless the authorization is terminated or revoked sooner. Performed at [facility], [place] [address] [place], [place] [address]   Culture, blood (Routine X 2) w Reflex to ID Panel     Status: None   Collection Time: 05/03/19  6:34 PM   Specimen: BLOOD RIGHT HAND  Result Value Ref Range Status   Specimen Description   Final    BLOOD RIGHT HAND Performed at [facility], [place] [address], [place], [place] [address]    Special Requests   Final    BOTTLES DRAWN AEROBIC ONLY Blood Culture results may not be optimal due to an inadequate volume of blood received in culture bottles Performed at [facility] [address], [place], [place] [address]    Culture   Final    NO GROWTH 5 DAYS Performed at [facility], [place] [address] [place], [place] [address]    Report Status 05/08/2019 FINAL  Final  Culture, blood (Routine X 2) w Reflex to ID Panel     Status: None   Collection Time: 05/03/19  8:49 PM   Specimen: BLOOD RIGHT HAND  Result Value Ref Range Status   Specimen Description   Final    BLOOD RIGHT HAND Performed at [facility] [address], [place], [place] [address]    Special Requests   Final    BOTTLES DRAWN AEROBIC ONLY Blood Culture adequate volume Performed at [facility] [address], [place], [place] [address]    Culture   Final    NO GROWTH 5 DAYS Performed at [facility], [place] [address], [place],  [address]    Report Status 05/09/2019 FINAL  Final  Gastrointestinal Panel by PCR , Stool     Status: None   Collection Time: 05/07/19  6:30 PM   Specimen: Stool  Result Value Ref Range Status   Campylobacter species NOT DETECTED NOT DETECTED Final   Plesimonas shigelloides NOT DETECTED NOT DETECTED Final   Salmonella species NOT DETECTED NOT DETECTED Final   Yersinia enterocolitica NOT DETECTED NOT DETECTED Final   Vibrio species NOT DETECTED NOT DETECTED Final   Vibrio cholerae NOT DETECTED NOT DETECTED Final   Enteroaggregative E coli (EAEC) NOT DETECTED NOT DETECTED Final   Enteropathogenic E coli (EPEC) NOT DETECTED NOT DETECTED Final   Enterotoxigenic E coli (ETEC) NOT DETECTED NOT DETECTED  Final   Shiga like toxin producing E coli (STEC) NOT DETECTED NOT DETECTED Final   Shigella/Enteroinvasive E coli (EIEC) NOT DETECTED NOT DETECTED Final   Cryptosporidium NOT DETECTED NOT DETECTED Final   Cyclospora cayetanensis NOT DETECTED NOT DETECTED Final   Entamoeba histolytica NOT DETECTED NOT DETECTED Final   Giardia lamblia NOT DETECTED NOT DETECTED Final   Adenovirus F40/41  NOT DETECTED NOT DETECTED Final   Astrovirus NOT DETECTED NOT DETECTED Final   Norovirus GI/GII NOT DETECTED NOT DETECTED Final   Rotavirus A NOT DETECTED NOT DETECTED Final   Sapovirus (I, II, IV, and V) NOT DETECTED NOT DETECTED Final    Comment: Performed at [facility], [place] [place], [place] [address]  C difficile quick scan w PCR reflex     Status: None   Collection Time: 05/08/19 10:50 AM   Specimen: STOOL  Result Value Ref Range Status   C Diff antigen NEGATIVE NEGATIVE Final   C Diff toxin NEGATIVE NEGATIVE Final   C Diff interpretation No C. difficile detected.  Final    Comment: Performed at [facility], [place] [address], [place], [place] [address]         Radiology Studies: Ct Head Wo Contrast  Result Date: 05/08/2019 CLINICAL DATA:  Unexplained altered level of consciousness (LOC). EXAM: CT HEAD WITHOUT CONTRAST TECHNIQUE: Contiguous axial images were obtained from the base of the skull through the vertex without intravenous contrast. COMPARISON:  05/03/2019 FINDINGS: Brain: No evidence of acute infarction, hemorrhage, hydrocephalus, extra-axial collection or mass lesion/mass effect. Generalized atrophy. Hypoattenuation of white matter, likely small vessel disease. Vascular: Calcification of the cavernous internal carotid arteries consistent with cerebrovascular atherosclerotic disease. No signs of intracranial large vessel occlusion. Skull: Negative for fracture. Prominent remodeling of the inner table of the skull, and diploic space, Adjacent to the superior sagittal sinus near the vertex, posterior frontal regions, secondary to arachnoid granulations. This is a chronic finding. Sinuses/Orbits: No acute finding. Other: None. IMPRESSION: Atrophy and small vessel disease. No acute intracranial findings. Stable appearance from priors. Electronically Signed   By: Staci Righter M.D.   On: 05/08/2019 15:09        Scheduled Meds:   cholestyramine light  4 g Oral BID   diphenoxylate-atropine  2 tablet Oral QID   enoxaparin  90 mg Subcutaneous Q24H   feeding supplement  1 Container Oral BID BM   feeding supplement (ENSURE ENLIVE)  237 mL Oral Q24H   Gerhardt's butt cream   Topical QID   levothyroxine  150 mcg Oral Daily   loperamide  2 mg Oral QID   multivitamin with minerals  1 tablet Oral Daily   octreotide  50 mcg Subcutaneous Q12H   polycarbophil  1,250 mg Oral BID   potassium chloride SA  20 mEq Oral Daily   predniSONE  5 mg Oral Q breakfast   sodium chloride flush  10-40 mL Intracatheter Q12H   temazepam  15 mg Oral QHS   vitamin B-12  1,000 mcg Oral Daily   Continuous Infusions:   sodium bicarbonate  infusion 1000 mL 100 mL/hr at 05/09/19 1156     LOS: 5 days    Time spent: 35 minutes    Elmarie Shiley, MD [facility] Pager [phone]  If 7PM-7AM, please contact night-coverage [email or web address] Password [facility] 05/09/2019, 1:54 PM

## 2019-05-09 NOTE — Progress Notes (Signed)
Calorie Count Note  48 hour calorie count ordered.  Diet: Regular Supplements: Ensure Enlive TID  Breakfast 6/19: 25%--213 kcal, 8 grams protein Lunch 6/19: 15%--90 kcal, 5 grams protein Dinner 6/19: 0% Breakfast 6/21: 0% Supplements: refusing or too lethargic; has not had any Ensure since before 6/19  Total intake: 303 kcal (16% of minimum estimated needs)  13 protein (15% of minimum estimated needs)  Nutrition Dx:  Increased nutrient needs related to chronic illness, cancer and cancer related treatments as evidenced by estimated needs.  Goal:  Patient will meet greater than or equal to 90% of their needs  Intervention:  - will decrease Ensure from TID to once/day, each supplement provides 350 kcal, 20 grams protein. - will order Boost Breeze BID, each supplement provides 250 kcal and 9 grams of protein. - will order Magic Cup BID with meals, each supplement provides 290 kcal and 9 grams of protein. - will continue to monitor plans/recommendations as provided in Palliative Care notes.      Jarome Matin, MS, RD, LDN, [facility] Inpatient Clinical Dietitian Pager # [phone] After hours/weekend pager # [phone]

## 2019-05-09 NOTE — Progress Notes (Addendum)
Patient ID: Nichole Cross, female   DOB: 11/02/1941, 78 y.o.   MRN: [id number] .    Progress Note   Subjective  Day #7 Pt  Remains unresponsive  Palliative care consult today    Objective   Vital signs in last 24 hours: Temp:  [97.4 F (36.3 C)-99.4 F (37.4 C)] 98.7 F (37.1 C) (06/22 1146) Pulse Rate:  [77-90] 89 (06/22 1146) Resp:  [12-16] 16 (06/22 1146) BP: (92-110)/(59-70) 109/70 (06/22 1146) SpO2:  [94 %-99 %] 95 % (06/22 1146) Weight:  [72 kg] 72 kg (06/22 0500) Last BM Date: 05/09/19(via colostomy) General:    white female in NAD Heart:  Regular rate and rhythm; no murmurs Lungs: Respirations even and unlabored, lungs CTA bilaterally Abdomen:  Soft, nontender and nondistended. Normal bowel sounds. Extremities:  Without edema. Neurologic:  Alert and oriented,  grossly normal neurologically. Psych:  Cooperative. Normal mood and affect.  Intake/Output from previous day: 06/21 0701 - 06/22 0700 In: 1417.3 [I.V.:1417.3] Out: 1550 [Stool:1550] Intake/Output this shift: No intake/output data recorded.  Lab Results: Recent Labs    05/07/19 0347 05/08/19 1314 05/09/19 0429  WBC 5.2 7.3 10.3  HGB 8.8* 9.2* 9.1*  HCT 28.8* 30.5* 30.7*  PLT 223 264 266   BMET Recent Labs    05/07/19 0347 05/08/19 0844 05/09/19 0429  NA 137 137 137  K 3.9 3.5 3.5  CL 111 113* 111  CO2 19* 17* 16*  GLUCOSE 114* 124* 137*  BUN 16 18 19   CREATININE 0.60 0.53 0.57  CALCIUM 7.1* 6.9* 7.0*   LFT No results for input(s): PROT, ALBUMIN, AST, ALT, ALKPHOS, BILITOT, BILIDIR, IBILI in the last 72 hours. PT/INR No results for input(s): LABPROT, INR in the last 72 hours.  Studies/Results: Ct Head Wo Contrast  Result Date: 05/08/2019 CLINICAL DATA:  Unexplained altered level of consciousness (LOC). EXAM: CT HEAD WITHOUT CONTRAST TECHNIQUE: Contiguous axial images were obtained from the base of the skull through the vertex without intravenous contrast. COMPARISON:  05/03/2019  FINDINGS: Brain: No evidence of acute infarction, hemorrhage, hydrocephalus, extra-axial collection or mass lesion/mass effect. Generalized atrophy. Hypoattenuation of white matter, likely small vessel disease. Vascular: Calcification of the cavernous internal carotid arteries consistent with cerebrovascular atherosclerotic disease. No signs of intracranial large vessel occlusion. Skull: Negative for fracture. Prominent remodeling of the inner table of the skull, and diploic space, Adjacent to the superior sagittal sinus near the vertex, posterior frontal regions, secondary to arachnoid granulations. This is a chronic finding. Sinuses/Orbits: No acute finding. Other: None. IMPRESSION: Atrophy and small vessel disease. No acute intracranial findings. Stable appearance from priors. Electronically Signed   By: Staci Righter M.D.   On: 05/08/2019 15:09       Assessment / Plan:    #63 78 year old female with history of adeno CA of the sigmoid colon, status post partial colectomy September 2016, then small bowel resection and transverse loop colostomy and Port-A-Cath  placement April 2020 has been on FOLFOX Patient with documented metastatic disease with peritoneal implants on CT February 2020, history of liver mets status post ablation Patient admitted with acute kidney injury, weakness and increased output from ostomy  Patient has been on Questran octreotide and Lomotil at home, and have been continued here  C. difficile negative, GI path panel negative  CT of the abdomen and pelvis was to be done however patient became unresponsive.  Head CT without contrast showed no acute intracranial findings  Per chart plan is for palliative care consultation today,  CT of the abdomen and pelvis on hold until goals of care are established.  May need MRI of the head. We will follow-up tomorrow.         Principal Problem:   Weakness Active Problems:   Hypothyroidism   Cystitis   Sigmoid colon cancer s/p  lap assisted sigmoid colectomy 08/09/15   Anemia, iron deficiency   Adenocarcinoma of colon metastatic to liver (HCC)   Dehydration   Acute lower UTI   Severe dehydration   Leg DVT (deep venous thromboembolism), acute, bilateral ([place])     LOS: 5 days   Amy Esterwood  05/09/2019, 12:07 PM     Attending physician's note   I have taken an interval history, reviewed the chart and examined the patient. I agree with the Advanced Practitioner's note, impression and recommendations.   Pt continues to be unresponsive.  Referred for residential hospice.  Would sign off.  Carmell Austria, MD [facility] [phone].

## 2019-05-09 NOTE — TOC Progression Note (Signed)
Transition of Care [facility]) - Progression Note    Patient Details  Name: Nichole Cross MRN: [id number] Date of Birth: Dec 09, 1940  Transition of Care [facility]) CM/SW Contact  Jmichael Gille, Juliann Pulse, RN Phone Number: 05/09/2019, 2:14 PM  Clinical Narrative:Spoke to dtr about referral for residential hospice[facility]rep Cheri following for assessment-await outcome-provided her dtr tel# Ann Hutchins [phone].       Expected Discharge Plan: [place] Barriers to Discharge: Continued Medical Work up  Expected Discharge Plan and Services Expected Discharge Plan: [place]   Discharge Planning Services: CM Consult Post Acute Care Choice: [place] arrangements for the past 2 months: Single Family Home                                       Social Determinants of Health (SDOH) Interventions    Readmission Risk Interventions Readmission Risk Prevention Plan 04/27/2019  Transportation Screening Complete  Medication Review Press photographer) Complete  HRI or [place] Complete  SW Recovery Care/Counseling Consult Complete  Palliative Care Screening Not [place] Not Applicable  Some recent data might be hidden

## 2019-05-09 NOTE — Progress Notes (Signed)
Daily Progress Note   Patient Name: Nichole Cross BJSEG       Date: 05/09/2019 DOB: [birth year]  Age: 78 y.o. MRN#: [id number] Attending Physician: Nichole Shiley, MD Primary Care Physician: Nichole Rail, MD Admit Date: 05/03/2019  Reason for Consultation/Follow-up: Establishing goals of care  Subjective: I saw and examined patient this AM. She is unresponsive to any verbal or tactile stimulation.  Continues on regimen for high output from ostomy.  Her family came as concern she is actively dying and for family meeting.  Her children were able to see her individually.    We then met to discuss her care moving forward.  Her children are clear that they do not want her to suffer and realize that she is at end of life.  She is not taking in any nutrition or hydration and she continues on aggressive regimen for ostomy output.  Discussed options of continued aggressive medical interventions vs refocusing care on comfort.  Length of Stay: 5  Current Medications: Scheduled Meds:  . cholestyramine light  4 g Oral BID  . diphenoxylate-atropine  2 tablet Oral QID  . enoxaparin  90 mg Subcutaneous Q24H  . feeding supplement  1 Container Oral BID BM  . feeding supplement (ENSURE ENLIVE)  237 mL Oral Q24H  . Gerhardt's butt cream   Topical QID  . levothyroxine  150 mcg Oral Daily  . loperamide  2 mg Oral QID  . multivitamin with minerals  1 tablet Oral Daily  . octreotide  50 mcg Subcutaneous Q12H  . polycarbophil  1,250 mg Oral BID  . potassium chloride SA  20 mEq Oral Daily  . predniSONE  5 mg Oral Q breakfast  . sodium chloride flush  10-40 mL Intracatheter Q12H  . temazepam  15 mg Oral QHS  . vitamin B-12  1,000 mcg Oral Daily    Continuous Infusions: .  sodium bicarbonate   infusion 1000 mL 100 mL/hr at 05/09/19 1156    PRN Meds: acetaminophen **OR** acetaminophen, albuterol, ipratropium, ondansetron **OR** ondansetron (ZOFRAN) IV, oxyCODONE-acetaminophen, sodium chloride flush, traMADol  Physical Exam         General: Somnolent, NAD- Does open eyes to daughter's voice, but remains nonverbal and does not interact other than opening eyes and yawning Heart: Regular rate and rhythm. No murmur appreciated.  Lungs: Decreased air movement, clear Abdomen: Soft, nondistended, positive bowel sounds.  Ext: No significant edema Skin: Warm and dry  Vital Signs: BP 109/70 (BP Location: Left Arm)   Pulse 89   Temp 98.7 F (37.1 C) (Oral)   Resp 16   Ht '5\' 7"'  (1.702 m)   Wt 72 kg   SpO2 95%   BMI 24.86 kg/m  SpO2: SpO2: 95 % O2 Device: O2 Device: Room Air O2 Flow Rate:    Intake/output summary:   Intake/Output Summary (Last 24 hours) at 05/09/2019 1311 Last data filed at 05/09/2019 0600 Gross per 24 hour  Intake 1417.32 ml  Output 575 ml  Net 842.32 ml   LBM: Last BM Date: 05/09/19(via colostomy) Baseline Weight: Weight: 60.8 kg Most recent weight: Weight: 72 kg       Palliative Assessment/Data:    Flowsheet Rows     Most Recent Value  Intake Tab  Referral Department  Hospitalist  Unit at Time of Referral  Med/Surg Unit  Palliative Care Primary Diagnosis  Cancer  Date Notified  05/08/19  Palliative Care Type  New Palliative care  Reason for referral  Clarify Goals of Care  Date of Admission  05/03/19  Date first seen by Palliative Care  05/08/19  # of days Palliative referral response time  0 Day(s)  # of days IP prior to Palliative referral  5  Clinical Assessment  Palliative Performance Scale Score  10%  Psychosocial & Spiritual Assessment  Palliative Care Outcomes  Patient/Family meeting held?  Yes  Who was at the meeting?  Daughter      Patient Active Problem List   Diagnosis Date Noted  . Weakness 05/03/2019  . Leg DVT (deep  venous thromboembolism), acute, bilateral ([place]) 04/30/2019  . Near syncope   . Severe dehydration 04/25/2019  . Dehydration 04/24/2019  . Acute lower UTI 04/24/2019  . Port-A-Cath in place 04/19/2019  . Goals of care, counseling/discussion 03/22/2019  . Local recurrence of rectal cancer  ([place]) 01/09/2017  . Adenocarcinoma of colon metastatic to liver ([place]) 07/25/2016  . Osteopenia, h/o OP 06/07/2016  . Anemia, iron deficiency 06/07/2016  . Sigmoid colon cancer s/p lap assisted sigmoid colectomy 08/09/15 08/09/2015  . Overweight (BMI 25.0-29.9) 09/10/2012  . S/P left UKR 09/09/2012  . ELEVATED BLOOD PRESSURE WITHOUT DIAGNOSIS OF HYPERTENSION 09/30/2010  . PHARYNGITIS 02/17/2008  . GANGLION OF TENDON SHEATH 12/20/2007  . FATTY LIVER DISEASE 08/13/2007  . Hypothyroidism 08/02/2007  . Cystitis 08/02/2007  . Osteoarthritis 08/02/2007    Palliative Care Assessment & Plan   Patient Profile: 78 y.o. female  with past medical history of adenocarcinoma of distal sigmoid colon s/p sigmoid colectomy, renal cell mass, high output from ostomy, bilateral DVTs admitted on 05/03/2019 with Weakness, UTI, high output from ostomy, and acute change in mental status.  She is remains unresponsive other than opening eyes but only to sound of family voice.   Assessment: Patient Active Problem List   Diagnosis Date Noted  . Weakness 05/03/2019  . Leg DVT (deep venous thromboembolism), acute, bilateral ([place]) 04/30/2019  . Near syncope   . Severe dehydration 04/25/2019  . Dehydration 04/24/2019  . Acute lower UTI 04/24/2019  . Port-A-Cath in place 04/19/2019  . Goals of care, counseling/discussion 03/22/2019  . Local recurrence of rectal cancer  ([place]) 01/09/2017  . Adenocarcinoma of colon metastatic to liver ([place]) 07/25/2016  . Osteopenia, h/o OP 06/07/2016  . Anemia, iron deficiency 06/07/2016  . Sigmoid colon cancer s/p lap assisted  sigmoid colectomy 08/09/15 08/09/2015  . Overweight (BMI 25.0-29.9)  09/10/2012  . S/P left UKR 09/09/2012  . ELEVATED BLOOD PRESSURE WITHOUT DIAGNOSIS OF HYPERTENSION 09/30/2010  . PHARYNGITIS 02/17/2008  . GANGLION OF TENDON SHEATH 12/20/2007  . FATTY LIVER DISEASE 08/13/2007  . Hypothyroidism 08/02/2007  . Cystitis 08/02/2007  . Osteoarthritis 08/02/2007   Recommendations/Plan:  I believe that Nichole Cross is transitioning to actively dying.  She in not taking in any nutrition or hydration and is still having ostomy output despite multidrug regimen.  I believe she will continue to have high symptom burden and recommend residential hospice if it can be arranged.  I discussed with family and they are in agreement with plan to pursue residential hospice for end of life care.  Family expressed preference for H[facility]ice [facility] in [facility].  Will request TOC to confirm choice and make referral as appropriate.  Code Status:    Code Status Orders  (From admission, onward)         Start     Ordered   05/03/19 1831  Do not attempt resuscitation (DNR)  Continuous    Question Answer Comment  In the event of cardiac or respiratory ARREST Do not call a "code blue"   In the event of cardiac or respiratory ARREST Do not perform Intubation, CPR, defibrillation or ACLS   In the event of cardiac or respiratory ARREST Use medication by any route, position, wound care, and other measures to relive pain and suffering. May use oxygen, suction and manual treatment of airway obstruction as needed for comfort.      05/03/19 1830        Code Status History    Date Active Date Inactive Code Status Order ID Comments User Context   04/24/2019 1151 04/30/2019 1904 Full Code [id number]  Mendel Corning, MD Inpatient   03/02/2019 1436 03/10/2019 1707 Full Code [id number]  Leighton Ruff, MD Inpatient   01/09/2017 1650 01/14/2017 1459 Full Code [id number]  Leighton Ruff, MD Inpatient   08/09/2015 1443 08/13/2015 1943 Full Code [id number]  Jackolyn Confer, MD Inpatient   09/09/2012 1128  09/10/2012 1800 Full Code [id number]  Jackelyn Knife, RN Inpatient   Advance Care Planning Activity    Advance Directive Documentation     Most Recent Value  Type of Advance Directive  Healthcare Power of Attorney  Pre-existing out of facility DNR order (yellow form or pink MOST form)  -  "MOST" Form in Place?  -       Prognosis:   < 2 weeks with her acute mental status change, no intake and continued losses from high output colostomy.  She has been on aggressive regimen for management of ostomy output, and I believe that she is a high risk for continued symptom burden and recommend residential hospice placement.  Discharge Planning:  Hospice facility  Care plan was discussed with Dr. Tyrell Antonio, patient's children  Thank you for allowing the Palliative Medicine Team to assist in the care of this patient.   Time In: 1300 Time Out: 1355 Total Time 55 Prolonged Time Billed No      Greater than 50%  of this time was spent counseling and coordinating care related to the above assessment and plan.  Micheline Rough, MD  Please contact Palliative Medicine Team phone at [phone] for questions and concerns.

## 2019-05-09 NOTE — Progress Notes (Addendum)
IP PROGRESS NOTE  Subjective:   Events over the weekend noted.  The patient is not responsive.  Had a CT of her head performed which showed no acute intracranial findings.  Still having a lot of liquid brown output from her colostomy.  Nursing reports no other issues.  Objective: Vital signs in last 24 hours: Blood pressure 103/69, pulse 82, temperature 99.4 F (37.4 C), temperature source Oral, resp. rate 16, height '5\' 7"'  (1.702 m), weight 158 lb 11.7 oz (72 kg), SpO2 94 %.  Intake/Output from previous day: 06/21 0701 - 06/22 0700 In: 1417.3 [I.V.:1417.3] Out: 1550 [Stool:1550]  Physical Exam:  General: Unresponsive to sternal rub. Looks comfortable.  HEENT: Pupils responsive.  No thrush Abdomen: Nontender, no hepatomegaly, liquid brown stool in the ostomy bag Extremities: 1+ edema at the left lower leg; trace edema to the right lower extremity  Portacath/PICC-without erythema  Lab Results: Recent Labs    05/08/19 1314 05/09/19 0429  WBC 7.3 10.3  HGB 9.2* 9.1*  HCT 30.5* 30.7*  PLT 264 266    BMET Recent Labs    05/08/19 0844 05/09/19 0429  NA 137 137  K 3.5 3.5  CL 113* 111  CO2 17* 16*  GLUCOSE 124* 137*  BUN 18 19  CREATININE 0.53 0.57  CALCIUM 6.9* 7.0*    Lab Results  Component Value Date   CEA1 178.58 (H) 03/28/2019     Medications: I have reviewed the patient's current medications.  Assessment/Plan: 1.Adenocarcinoma the distal sigmoid colon, poorly differentiated, stage II (T3 N0), status post a laparoscopic assisted sigmoid colectomy 08/09/2015  No loss of mismatch repair protein expression  Elevated preoperative CEA  Staging CT of the abdomen and pelvis 06/22/2015 with no evidence of distant metastatic disease  Mildly elevated CEA 02/23/2017And 04/17/2016  CTs chest, abdomen, and pelvis on 06/04/2016, 77m left upper lobe nodule-no comparison available, new hypodense lesion in the left liver, solid Right renal mass  MRI 06/10/2016-2  enhancing lesions in the liver consistent with metastatic disease, segment 2 and segment 5. Solid enhancing right renal lesion consistent with a renal cell carcinoma  Radiofrequency ablation of 2 liver lesions on 07/25/2016  Cycle 1 adjuvant Xeloda 08/18/2016  Cycle 2 Xeloda 09/08/2016  CEA improved 09/26/2016  Cycle 3 Xeloda held 09/29/2016 due to unexplained abdominal pain,referred for CT  CT abdomen/pelvis 09/30/2016 with a long segment of bowel wall thickening involving the distal ileum; lesion left hepatic lobe similar to slightly smaller following ablation; lesion posterior right hepatic lobe elongated favored to represent treatment tract.  Cycle 3 Xeloda 10/10/2016 (dose reduced due to drug induced enteritis following cycle 2)discontinued 10/15/2016 secondary to abdominal pain.  CT in while the emergency room with abdominal pain 10/15/2016 revealed descending colitis  Colonoscopy 10/30/2016 confirmed a mass at the: colo-colonicanastomosis  Cycle 4 Xeloda 11/03/2016  01/09/2017 status post a low anterior resection for removal of locally recurrent tumor at the sigmoid anastomosis, resection margins negative, tumor invades pericolonic soft tissue, 12 benign lymph nodes;MSI stable. Foundation 1 testing- KRAS G13D, MSS, tumor mutation burden-3,BRAFwild-type  Cycle 5 Xeloda 02/12/2017  Cycle 6 Xeloda 03/12/2017, Xeloda dose reduced to 1000 mg twice daily on 03/18/2017  Cycle 7 Xeloda 04/09/2017  Cycle 8 Xeloda 05/12/2017  Restaging CTs 06/24/2017-no evidence of metastatic disease involving the chest. Post ablation changes in the liver. No new hepatic metastatic disease.  Restaging CTs 12/14/2017- stable liver ablation sites, indeterminate nodule adjacent to the descending colon, stable left renal mass, changes of early cirrhosis, enlarged  porta hepatis node-likely reactive  MRI abdomen 04/22/2018- enlargement of 1.7 cm left paracolic gutter mass, stable 1 cm extrahepatic left  liver capsule lesion, no evidence of local tumor recurrence at the ablation sites, no new liver lesions, stable right kidney mass  Colonoscopy 05/19/2018- mass in the rectum at the anastomotic site beginning at 3 cm from the anal verge, biopsy confirmed invasive adenocarcinoma  CT 06/07/2018-stable liver ablation sites, enlarging peritoneal lesion at the left pericolic gutter, stable nodule in the sigmoid mesocolon, stable prominent periportal lymph nodes, no evidence of metastatic disease to the chest  Radiation/Xeloda beginning 06/21/2018  Xeloda dose reduced to 1000 mg twice daily days of radiationbeginning8/16/2019 due to neutropenia  Radiation/Xeloda completed 08/02/2018  CTs 10/07/2018- new 6 mm right upper lobe nodule, stable ablation changes in the left and right liver, stable superior pole right renal mass, increased soft tissue thickening the presacral region, stable 12 mm porta hepatis node, increase in size of soft tissue nodule adjacent to the descending colon-2.1 x 2.2 cm  CTs 01/11/2019- anterior right upper lobe subpleural nodule-smaller, no suspicious lung nodules, stable ablation defects in the liver, stable right kidney mass, wall thickening at the suture line in the lower pelvis with extension to the presacral region/vaginal fornix, slight enlargement of left pericolic gutter peritoneal implant  Sigmoidoscopy 01/27/2019- mass at 5 cm from the anal verge, biopsy confirmed adenocarcinoma  Laparoscopic transverse colostomy and Port-A-Cath placement 03/02/2019- tumor noted to be studding the descending colon  Cycle 1 FOLFOX 04/04/2019    2.History ofMicrocytic anemia-likely iron deficiency anemia secondary to colon cancer, persistent microcytic anemia;ferrous sulfate increased to twice daily 12/15/2017;hemoglobin in normal range 03/16/2018  3.Tubular villous adenoma on the sigmoid colon resection specimen 08/09/2015  Multiple tubular adenomas removed on a colonoscopy  11/16/2015  4. Right renal mass on CT 06/04/2016-suspicious for renal cell carcinoma;stable on CT 06/24/2017;referred to urology  Stable on CT 01/11/2019  5. History ofneutropenia secondary to chemotherapy  6.Pain secondary to local recurrence of colon cancer-improved  7.Abdominal pain. CT abdomen/pelvis 07/24/2018- inflammatory changes and mild dilatation of multiple small bowel loops in the lower abdomen and pelvis. Unchanged metastatic implant in the left paracolic gutter; unchanged periportal lymphadenopathy; hepatic steatosis; stable liver ablation site; unchanged 2.7 cm enhancing right renal mass.Resolved.  8.Severe macrocytic anemia 03/28/2019- vitamin B12 level at the low end of normal, hypersegmented neutrophils noted on peripheral blood smear, DAT positive, elevated LDH and bilirubin  Prednisone, 60 mg daily started 03/29/2019  1 unit of packed red blood cells 03/28/2019  Hemoglobin improved 03/30/2019, stable 03/31/2019  B12 initiated 03/30/2019  Hemoglobin improved   9.  Admission 04/24/2019 with presyncope 10.  Urinary tract infection 04/24/2019 11.  Bilateral lower extremity DVTs on Doppler 04/25/2019- right peroneal, left posterior tibial and left peroneal  Lovenox started  Nichole Cross is now unresponsive.  Question possible acute neurological event, but no acute intracranial findings were noted on the CT of her head.  Palliative care consult has been requested and will meet with the family later today.  Recommendations: 1.  Agree with palliative care consult for [place]. Patient is a DNR. 2. Discussed with Dr. Domingo Cocking who has suggested residential Hospice and will discuss with family later today. 3. Patient unable to take PO meds secondary to unresponsiveness. Recommend stopping any non-essential meds.     LOS: 5 days   Nichole Bussing, NP   05/09/2019, 10:27 AM  Nichole Cross was interviewed and examined.  She has become unresponsive over the past several days.  The etiology of the altered mental status is unclear.  I doubt this is related to the tincture of opium given last week.  She has metastatic colon cancer and a poor performance status at baseline.  I agree with the plan for comfort care.  I discussed the case with her daughter, Nichole Cross.  She agrees with the plan for comfort care and understands Ms. Karner will likely not live beyond days.  Julieanne Manson, MD

## 2019-05-10 ENCOUNTER — Inpatient Hospital Stay: Payer: Medicare Other | Admitting: Nurse Practitioner

## 2019-05-10 ENCOUNTER — Inpatient Hospital Stay: Payer: Medicare Other

## 2019-05-10 ENCOUNTER — Ambulatory Visit: Payer: Medicare Other | Admitting: Nurse Practitioner

## 2019-05-10 LAB — GLUCOSE, CAPILLARY
Glucose-Capillary: 133 mg/dL — ABNORMAL HIGH (ref 70–99)
Glucose-Capillary: 140 mg/dL — ABNORMAL HIGH (ref 70–99)
Glucose-Capillary: 114 mg/dL — ABNORMAL HIGH (ref 70–99)

## 2019-05-10 MED ORDER — HEPARIN SOD (PORK) LOCK FLUSH 100 UNIT/ML IV SOLN
500.0000 [IU] | INTRAVENOUS | Status: DC | PRN
  Administered 2019-05-10: 500 [IU]
  Filled 2019-05-10 (×2): qty 5

## 2019-05-10 MED ORDER — OXYCODONE-ACETAMINOPHEN 5-325 MG PO TABS: 1.0000 | ORAL_TABLET | ORAL | 0 refills | Status: AC | PRN

## 2019-05-10 MED ORDER — ONDANSETRON HCL 4 MG/2ML IJ SOLN: 4.0000 mg | Freq: Four times a day (QID) | INTRAMUSCULAR | 0 refills | Status: AC | PRN

## 2019-05-10 NOTE — Progress Notes (Signed)
OT Cancellation Note  Patient Details Name: Nichole Cross MRN: [id number] DOB: Feb 01, 1941   Cancelled Treatment:    Reason Eval/Treat Not Completed: Medical issues which prohibited therapy;Other (comment); noted per chart review pt with change in medical status, has been unresponsive to sternal rub and most voices, likely transitioning to hospice care. Acute OT to sign off at this time. Please re-consult should pt needs change.   Lou Cal, OT [facility] Pager [phone] Office [phone]   Raymondo Band 05/10/2019, 9:28 AM

## 2019-05-10 NOTE — Discharge Summary (Signed)
Physician Discharge Summary  Nichole Cross AST:[id number] DOB: February 03, 1941 DOA: 05/03/2019  PCP: [name], MD  Admit date: 05/03/2019 Discharge date: 05/10/2019  Admitted From: Home  Disposition: Residential Hospice  Recommendations for Outpatient Follow-up:  1. Transfer to residential Hospice for comfort care.   Discharge Condition: Guarded CODE STATUS:DNR Diet recommendation: comfort feeding.   Brief/Interim Summary: 78 year old with past medical history significant for adenocarcinoma of the distal sigmoid colon poorly differentiated stage II a status post laparoscopic-assisted sigmoid colectomy on 9/26 12/2014 with ongoing chemotherapy, of the anal verge status post laparoscopic transverse colostomy and a Port-A-Cath placement on 4/15 2020 tumor to be yesterday in the descending colon is status post 1 cycle FOLFOX on 04/04/2019, history of renal cell mass suspicious for renal cell carcinoma stable, chronic pain secondary to local recurrence of colon cancer, history of volume depletion secondary to increased ostomy output, recently hospitalized from 04/23/2020 to 04/30/2019 for bilateral lower extremity DVTs, near syncope and increase output from ostomy who was discharged on Imodium, Metamucil, Lomotil.  Patient follow-up with oncology on 6/15 she was given IV fluids and a tincture of opium.  She presents to the ED with significant weakness to the point that she is not able to stand, continue to have increased out food from ostomy.  Patient family also noticed that her urine was black and dark.  Evaluation in the ED urine analysis consistent with UTI, creatinine at 2.2, hemoglobin 9.7.  Covid  negative.  Patient was treated aggressively with fluids, octreotide, oral fiber, lomotil, with no improvement of ostomy out put. She was also treated for UTI with IV antibiotics. Patient subsequently develop AMS, of unclear etiology. ABG and CT head negative. She could have  delirium, vs stroke vs  meningeal mets, patient with poor prognosis. Plan for palliative care meeting with family. after meeting it was decide to proceed with comfort care. Patient will be transfer to residential Hospice when bed available.      Discharge Diagnoses:  Principal Problem:   Weakness Active Problems:   Hypothyroidism   Cystitis   Sigmoid colon cancer s/p lap assisted sigmoid colectomy 08/09/15   Anemia, iron deficiency   Adenocarcinoma of colon metastatic to liver (HCC)   Dehydration   Acute lower UTI   Severe dehydration   Leg DVT (deep venous thromboembolism), acute, bilateral (HCC)  1-Generalized weakness likely related to failure to thrive related to increased ostomy output: Continue with IV fluids. PT evaluation. Continue to be weak.   2-Acute metabolic encephalopathy;  She is obtunded today, non responsive.  CT head no acute intracranial abnormalities.  ABG; no increase CO2.  Palliative care for goals of care.  Unclear if related to delirium, vs stroke vs meningeal mets, patient with poor prognosis. Plan for palliative care meeting with family.  Now on comfort care.   Increase ostomy output: Likely secondary to short gut syndrome. Continue with Imodium, lomotil Continue with Questran and octreotide subcu last dose tonight.  General surgery consulted and sign off.  Started on fibercorn by general surgery.  Discussed with Dr Benay Spice, plan to start opium 6-18--discontinue due to AMS.  Appreciate surgery evaluation regarding fistula, per Dr Marcello Moores patient with high tumor burden could have drainage from rectum or vagina.  -Opium stopped due to AMS. On  octeotride. GI was consulted.  -Patient has received multiples drugs therapy for increase out put without benefit.   Metabolic acidosis.  On IV bicarb Gtt   UTI: Treated with cefepime, urine growing citrobacter.  Hypothyroidism: Continue with Synthroid.  Acute bilateral lower extremity DVT: Diagnosis 6/8. Stop Lovenox  at discharge. Patient is comfort care.   Dehydration: Continue with IV fluids.  Renal mass: Under surveillance Adenocarcinoma of the colon with mets to the liver: Patient undergoing chemotherapy status post transverse colostomy and Port-A-Cath placement 03/07/2019, started chemotherapy FOLFOX cycle 1 on 04/04/2019.  Oncology consulted.  Hypoglycemia: Treated with  IV fluid to D5 normal saline.   Discharge Instructions   Allergies as of 05/10/2019      Reactions   Lactose Intolerance (gi) Diarrhea   gas   Aspirin Itching   Morphine And Related Rash   "veins popped up and they gave me benadryl      Medication List    STOP taking these medications   diphenoxylate-atropine 2.5-0.025 MG tablet Commonly known as: LOMOTIL   enoxaparin 80 MG/0.8ML injection Commonly known as: LOVENOX   Ensure   ferrous sulfate 325 (65 FE) MG EC tablet   levothyroxine 150 MCG tablet Commonly known as: SYNTHROID   Opium 10 MG/ML (1%) Tinc   potassium chloride SA 20 MEQ tablet Commonly known as: K-DUR   prochlorperazine 10 MG tablet Commonly known as: COMPAZINE   temazepam 15 MG capsule Commonly known as: RESTORIL   traMADol 50 MG tablet Commonly known as: ULTRAM   vitamin B-12 1000 MCG tablet Commonly known as: CYANOCOBALAMIN     TAKE these medications   loperamide 2 MG capsule Commonly known as: IMODIUM Take 1 capsule (2 mg total) by mouth 4 (four) times daily.   ondansetron 4 MG/2ML Soln injection Commonly known as: ZOFRAN Inject 2 mLs (4 mg total) into the vein every 6 (six) hours as needed for nausea.   oxyCODONE-acetaminophen 5-325 MG tablet Commonly known as: Percocet Take 1-2 tablets by mouth every 4 (four) hours as needed for severe pain.   predniSONE 10 MG tablet Commonly known as: DELTASONE Take 1 tablet (10 mg total) by mouth daily with breakfast.      Follow-up Information    [facility].   Specialty: Home Health  Services Why: [facility] physical therapy/occupational therapy/aide         Allergies  Allergen Reactions  . Lactose Intolerance (Gi) Diarrhea    gas  . Aspirin Itching  . Morphine And Related Rash    "veins popped up and they gave me benadryl    Consultations: Oncology Surgery Palliative.   Procedures/Studies: Ct Head Wo Contrast  Result Date: 05/08/2019 CLINICAL DATA:  Unexplained altered level of consciousness (LOC). EXAM: CT HEAD WITHOUT CONTRAST TECHNIQUE: Contiguous axial images were obtained from the base of the skull through the vertex without intravenous contrast. COMPARISON:  05/03/2019 FINDINGS: Brain: No evidence of acute infarction, hemorrhage, hydrocephalus, extra-axial collection or mass lesion/mass effect. Generalized atrophy. Hypoattenuation of white matter, likely small vessel disease. Vascular: Calcification of the cavernous internal carotid arteries consistent with cerebrovascular atherosclerotic disease. No signs of intracranial large vessel occlusion. Skull: Negative for fracture. Prominent remodeling of the inner table of the skull, and diploic space, Adjacent to the superior sagittal sinus near the vertex, posterior frontal regions, secondary to arachnoid granulations. This is a chronic finding. Sinuses/Orbits: No acute finding. Other: None. IMPRESSION: Atrophy and small vessel disease. No acute intracranial findings. Stable appearance from priors. Electronically Signed   By: Staci Righter M.D.   On: 05/08/2019 15:09   Ct Head Wo Contrast  Result Date: 05/03/2019 CLINICAL DATA:  Weakness. The patient has been unable to eat for  the past 24 hours. EXAM: CT HEAD WITHOUT CONTRAST TECHNIQUE: Contiguous axial images were obtained from the base of the skull through the vertex without intravenous contrast. COMPARISON:  Head CT scan 12/28/2016. FINDINGS: Brain: No evidence of acute infarction, hemorrhage, hydrocephalus, extra-axial collection or mass lesion/mass effect. Cortical  atrophy noted. Vascular: No hyperdense vessel or unexpected calcification. Skull: Intact.  No focal lesion. Sinuses/Orbits: Negative. Other: None. IMPRESSION: No acute abnormality. Cortical atrophy. Electronically Signed   By: Inge Rise M.D.   On: 05/03/2019 11:59   Vas [place] Lower Extremity Venous (dvt)  Result Date: 04/25/2019  Lower Venous Study Indications: Edema. Patient states has had left lower extremity for approximately 1 month.  Risk Factors: Cancer Metastatic colon cancer to liver. Comparison Study: No prior study on file for comparison Performing Technologist: Sharion Dove RVS  Examination Guidelines: A complete evaluation includes B-mode imaging, spectral Doppler, color Doppler, and power Doppler as needed of all accessible portions of each vessel. Bilateral testing is considered an integral part of a complete examination. Limited examinations for reoccurring indications may be performed as noted.  +---------+---------------+---------+-----------+----------+-------+ RIGHT    CompressibilityPhasicitySpontaneityPropertiesSummary +---------+---------------+---------+-----------+----------+-------+ CFV      Full           Yes      Yes                          +---------+---------------+---------+-----------+----------+-------+ SFJ      Full                                                 +---------+---------------+---------+-----------+----------+-------+ FV Prox  Full                                                 +---------+---------------+---------+-----------+----------+-------+ FV Mid   Full                                                 +---------+---------------+---------+-----------+----------+-------+ FV DistalFull                                                 +---------+---------------+---------+-----------+----------+-------+ PFV      Full                                                  +---------+---------------+---------+-----------+----------+-------+ POP      Full           Yes      Yes                          +---------+---------------+---------+-----------+----------+-------+ PTV      Full                                                 +---------+---------------+---------+-----------+----------+-------+  PERO     None                                         Acute   +---------+---------------+---------+-----------+----------+-------+   +---------+---------------+---------+-----------+----------+-----------------+ LEFT     CompressibilityPhasicitySpontaneityPropertiesSummary           +---------+---------------+---------+-----------+----------+-----------------+ CFV      Partial        Yes      Yes                  Chronic           +---------+---------------+---------+-----------+----------+-----------------+ SFJ      Partial                                      Chronic           +---------+---------------+---------+-----------+----------+-----------------+ FV Prox  Full                                                           +---------+---------------+---------+-----------+----------+-----------------+ FV Mid   Full                                                           +---------+---------------+---------+-----------+----------+-----------------+ FV DistalPartial                                      Age Indeterminate +---------+---------------+---------+-----------+----------+-----------------+ PFV      Partial                                      Chronic           +---------+---------------+---------+-----------+----------+-----------------+ POP      None                                         Age Indeterminate +---------+---------------+---------+-----------+----------+-----------------+ PTV      None                                         Acute              +---------+---------------+---------+-----------+----------+-----------------+ PERO     None                                         Acute             +---------+---------------+---------+-----------+----------+-----------------+     Summary: Right: Findings consistent with acute deep vein thrombosis involving the right peroneal vein. Left: Findings consistent with acute deep vein thrombosis involving  the left posterior tibial vein, and left peroneal vein. Findings consistent with age indeterminate deep vein thrombosis involving the left distal femoral vein, and left popliteal vein. Findings consistent with chronic deep vein thrombosis involving the left common femoral vein, and left proximal profunda vein.  *See table(s) above for measurements and observations. Electronically signed by Monica Martinez MD on 04/25/2019 at 4:19:38 PM.    Final     Subjective: lethargic  Discharge Exam: Vitals:   05/09/19 2041 05/10/19 0432  BP: (!) 99/58 104/80  Pulse: 90 (!) 102  Resp: 19 16  Temp: 97.6 F (36.4 C) 98.5 F (36.9 C)  SpO2: 93% 96%     General: obtunded Cardiovascular: RRR, S1/S2 +, no rubs, no gallops Respiratory: Bilateral ronchus.  Abdominal: Soft, NT, ND, bowel sounds + ostomy  Extremities: no edema, no cyanosis    The results of significant diagnostics from this hospitalization (including imaging, microbiology, ancillary and laboratory) are listed below for reference.     Microbiology: Recent Results (from the past 240 hour(s))  Urine culture     Status: Abnormal   Collection Time: 05/03/19  2:25 PM   Specimen: Urine, Random  Result Value Ref Range Status   Specimen Description   Final    URINE, RANDOM Performed at [facility] [address] [place], [place] [address]    Special Requests   Final    NONE Performed at [facility], [place] [address] [place], [place] [address]    Culture >=100,000 COLONIES/mL CITROBACTER  FREUNDII (A)  Final   Report Status 05/05/2019 FINAL  Final   Organism ID, Bacteria CITROBACTER FREUNDII (A)  Final      Susceptibility   Citrobacter freundii - MIC*    CEFAZOLIN >=64 RESISTANT Resistant     CEFTRIAXONE 4 SENSITIVE Sensitive     CIPROFLOXACIN >=4 RESISTANT Resistant     GENTAMICIN >=16 RESISTANT Resistant     IMIPENEM <=0.25 SENSITIVE Sensitive     NITROFURANTOIN <=16 SENSITIVE Sensitive     TRIMETH/SULFA >=320 RESISTANT Resistant     PIP/TAZO 16 SENSITIVE Sensitive     * >=100,000 COLONIES/mL CITROBACTER FREUNDII  SARS Coronavirus 2 (CEPHEID - Performed in [facility] hospital lab), Hosp Order     Status: None   Collection Time: 05/03/19  4:31 PM   Specimen: Nasopharyngeal Swab  Result Value Ref Range Status   SARS Coronavirus 2 NEGATIVE NEGATIVE Final    Comment: (NOTE) If result is NEGATIVE SARS-CoV-2 target nucleic acids are NOT DETECTED. The SARS-CoV-2 RNA is generally detectable in upper and lower  respiratory specimens during the acute phase of infection. The lowest  concentration of SARS-CoV-2 viral copies this assay can detect is 250  copies / mL. A negative result does not preclude SARS-CoV-2 infection  and should not be used as the sole basis for treatment or other  patient management decisions.  A negative result may occur with  improper specimen collection / handling, submission of specimen other  than nasopharyngeal swab, presence of viral mutation(s) within the  areas targeted by this assay, and inadequate number of viral copies  (<250 copies / mL). A negative result must be combined with clinical  observations, patient history, and epidemiological information. If result is POSITIVE SARS-CoV-2 target nucleic acids are DETECTED. The SARS-CoV-2 RNA is generally detectable in upper and lower  respiratory specimens dur ing the acute phase of infection.  Positive  results are indicative of active infection with SARS-CoV-2.  Clinical  correlation  with  patient history and other diagnostic information is  necessary to determine patient infection status.  Positive results do  not rule out bacterial infection or co-infection with other viruses. If result is PRESUMPTIVE POSTIVE SARS-CoV-2 nucleic acids MAY BE PRESENT.   A presumptive positive result was obtained on the submitted specimen  and confirmed on repeat testing.  While 2019 novel coronavirus  (SARS-CoV-2) nucleic acids may be present in the submitted sample  additional confirmatory testing may be necessary for epidemiological  and / or clinical management purposes  to differentiate between  SARS-CoV-2 and other Sarbecovirus currently known to infect humans.  If clinically indicated additional testing with an alternate test  methodology [phone]) is advised. The SARS-CoV-2 RNA is generally  detectable in upper and lower respiratory sp ecimens during the acute  phase of infection. The expected result is Negative. Fact Sheet for Patients:  StrictlyIdeas.no Fact Sheet for Healthcare Providers: BankingDealers.co.za This test is not yet approved or cleared by the [place] FDA and has been authorized for detection and/or diagnosis of SARS-CoV-2 by FDA under an Emergency Use Authorization (EUA).  This EUA will remain in effect (meaning this test can be used) for the duration of the COVID-19 declaration under Section 564(b)(1) of the Act, 21 U.S.C. section 360bbb-3(b)(1), unless the authorization is terminated or revoked sooner. Performed at [facility], [place] [address] [place], [place] [address]   Culture, blood (Routine X 2) w Reflex to ID Panel     Status: None   Collection Time: 05/03/19  6:34 PM   Specimen: BLOOD RIGHT HAND  Result Value Ref Range Status   Specimen Description   Final    BLOOD RIGHT HAND Performed at [facility], [place] [address] [place], [place] [address]    Special Requests   Final     BOTTLES DRAWN AEROBIC ONLY Blood Culture results may not be optimal due to an inadequate volume of blood received in culture bottles Performed at [facility] [address], [place], [place] [address]    Culture   Final    NO GROWTH 5 DAYS Performed at [facility], [place] [address] [place], [place] [address]    Report Status 05/08/2019 FINAL  Final  Culture, blood (Routine X 2) w Reflex to ID Panel     Status: None   Collection Time: 05/03/19  8:49 PM   Specimen: BLOOD RIGHT HAND  Result Value Ref Range Status   Specimen Description   Final    BLOOD RIGHT HAND Performed at [facility] [address], [place], [place] [address]    Special Requests   Final    BOTTLES DRAWN AEROBIC ONLY Blood Culture adequate volume Performed at [facility] [address], [place], [place] [address]    Culture   Final    NO GROWTH 5 DAYS Performed at [facility], [place] [address] [place], [place] [address]    Report Status 05/09/2019 FINAL  Final  Gastrointestinal Panel by PCR , Stool     Status: None   Collection Time: 05/07/19  6:30 PM   Specimen: Stool  Result Value Ref Range Status   Campylobacter species NOT DETECTED NOT DETECTED Final   Plesimonas shigelloides NOT DETECTED NOT DETECTED Final   Salmonella species NOT DETECTED NOT DETECTED Final   Yersinia enterocolitica NOT DETECTED NOT DETECTED Final   Vibrio species NOT DETECTED NOT DETECTED Final   Vibrio cholerae NOT  DETECTED NOT DETECTED Final   Enteroaggregative E coli (EAEC) NOT DETECTED NOT DETECTED Final   Enteropathogenic E coli (EPEC) NOT DETECTED NOT DETECTED Final   Enterotoxigenic E coli (ETEC) NOT DETECTED NOT DETECTED Final   Shiga like toxin producing E coli (STEC) NOT DETECTED NOT DETECTED Final   Shigella/Enteroinvasive E coli (EIEC) NOT DETECTED NOT DETECTED Final   Cryptosporidium NOT DETECTED NOT DETECTED Final   Cyclospora cayetanensis NOT  DETECTED NOT DETECTED Final   Entamoeba histolytica NOT DETECTED NOT DETECTED Final   Giardia lamblia NOT DETECTED NOT DETECTED Final   Adenovirus F40/41 NOT DETECTED NOT DETECTED Final   Astrovirus NOT DETECTED NOT DETECTED Final   Norovirus GI/GII NOT DETECTED NOT DETECTED Final   Rotavirus A NOT DETECTED NOT DETECTED Final   Sapovirus (I, II, IV, and V) NOT DETECTED NOT DETECTED Final    Comment: Performed at [facility], [place] [place], [place] [address]  C difficile quick scan w PCR reflex     Status: None   Collection Time: 05/08/19 10:50 AM   Specimen: STOOL  Result Value Ref Range Status   C Diff antigen NEGATIVE NEGATIVE Final   C Diff toxin NEGATIVE NEGATIVE Final   C Diff interpretation No C. difficile detected.  Final    Comment: Performed at [facility], [place] [address], [place], [place] [address]     Labs: BNP (last 3 results) No results for input(s): BNP in the last 8760 hours. Basic Metabolic Panel: Recent Labs  Lab 05/03/19 1034 05/03/19 2049  05/05/19 0811 05/06/19 0348 05/06/19 1037 05/07/19 0347 05/08/19 0844 05/09/19 0429  NA 133*  --    < > 136 137  --  137 137 137  K 4.3  --    < > 3.7 4.1  --  3.9 3.5 3.5  CL 98  --    < > 109 111  --  111 113* 111  CO2 22  --    < > 19* 20*  --  19* 17* 16*  GLUCOSE 76  --    < > 135* 143*  --  114* 124* 137*  BUN 24*  --    < > 19 15  --  16 18 19   CREATININE 0.62  --    < > 0.49 0.49  --  0.60 0.53 0.57  CALCIUM 7.6*  --    < > 7.2* 7.2*  --  7.1* 6.9* 7.0*  MG 1.9 1.9  --  1.7  --  2.1  --   --   --    < > = values in this interval not displayed.   Liver Function Tests: Recent Labs  Lab 05/03/19 1034 05/04/19 0625  AST 25 19  ALT 18 14  ALKPHOS 466* 404*  BILITOT 0.5 0.4  PROT 4.8* 4.3*  ALBUMIN 2.1* 1.7*   Recent Labs  Lab 05/03/19 1034  LIPASE 18   Recent Labs  Lab 05/03/19 1032  AMMONIA 68*   CBC: Recent Labs  Lab 05/03/19 1034  05/05/19 0811  05/06/19 1037 05/07/19 0347 05/08/19 1314 05/09/19 0429  WBC 7.8   < > 5.4 4.9 5.2 7.3 10.3  NEUTROABS 6.4  --   --   --   --   --   --   HGB 9.7*   < > 8.7* 8.9* 8.8* 9.2* 9.1*  HCT 32.1*   < > 29.6* 30.3* 28.8* 30.5* 30.7*  MCV 101.3*   < >  104.6* 104.1* 103.6* 102.7* 102.3*  PLT 324   < > 259 289 223 264 266   < > = values in this interval not displayed.   Cardiac Enzymes: No results for input(s): CKTOTAL, CKMB, CKMBINDEX, TROPONINI in the last 168 hours. BNP: Invalid input(s): POCBNP CBG: Recent Labs  Lab 05/09/19 1659 05/09/19 2038 05/09/19 2337 05/10/19 0429 05/10/19 0741  GLUCAP 110* 100* 119* 133* 140*   D-Dimer No results for input(s): DDIMER in the last 72 hours. Hgb A1c No results for input(s): HGBA1C in the last 72 hours. Lipid Profile No results for input(s): CHOL, HDL, LDLCALC, TRIG, CHOLHDL, LDLDIRECT in the last 72 hours. Thyroid function studies No results for input(s): TSH, T4TOTAL, T3FREE, THYROIDAB in the last 72 hours.  Invalid input(s): FREET3 Anemia work up No results for input(s): VITAMINB12, FOLATE, FERRITIN, TIBC, IRON, RETICCTPCT in the last 72 hours. Urinalysis    Component Value Date/Time   COLORURINE YELLOW 05/05/2019 0416   APPEARANCEUR CLOUDY (A) 05/05/2019 0416   LABSPEC 1.009 05/05/2019 0416   PHURINE 6.0 05/05/2019 0416   GLUCOSEU NEGATIVE 05/05/2019 0416   HGBUR MODERATE (A) 05/05/2019 0416   HGBUR negative 04/13/2009 0902   BILIRUBINUR NEGATIVE 05/05/2019 0416   KETONESUR NEGATIVE 05/05/2019 0416   PROTEINUR NEGATIVE 05/05/2019 0416   UROBILINOGEN 1.0 09/08/2012 1055   NITRITE NEGATIVE 05/05/2019 0416   LEUKOCYTESUR LARGE (A) 05/05/2019 0416   Sepsis Labs Invalid input(s): PROCALCITONIN,  WBC,  LACTICIDVEN Microbiology Recent Results (from the past 240 hour(s))  Urine culture     Status: Abnormal   Collection Time: 05/03/19  2:25 PM   Specimen: Urine, Random  Result Value Ref Range Status   Specimen Description    Final    URINE, RANDOM Performed at [facility], [place] [address] [place], [place] [address]    Special Requests   Final    NONE Performed at [facility], [place] [address], [place], [place] [address]    Culture >=100,000 COLONIES/mL CITROBACTER FREUNDII (A)  Final   Report Status 05/05/2019 FINAL  Final   Organism ID, Bacteria CITROBACTER FREUNDII (A)  Final      Susceptibility   Citrobacter freundii - MIC*    CEFAZOLIN >=64 RESISTANT Resistant     CEFTRIAXONE 4 SENSITIVE Sensitive     CIPROFLOXACIN >=4 RESISTANT Resistant     GENTAMICIN >=16 RESISTANT Resistant     IMIPENEM <=0.25 SENSITIVE Sensitive     NITROFURANTOIN <=16 SENSITIVE Sensitive     TRIMETH/SULFA >=320 RESISTANT Resistant     PIP/TAZO 16 SENSITIVE Sensitive     * >=100,000 COLONIES/mL CITROBACTER FREUNDII  SARS Coronavirus 2 (CEPHEID - Performed in [facility] hospital lab), Hosp Order     Status: None   Collection Time: 05/03/19  4:31 PM   Specimen: Nasopharyngeal Swab  Result Value Ref Range Status   SARS Coronavirus 2 NEGATIVE NEGATIVE Final    Comment: (NOTE) If result is NEGATIVE SARS-CoV-2 target nucleic acids are NOT DETECTED. The SARS-CoV-2 RNA is generally detectable in upper and lower  respiratory specimens during the acute phase of infection. The lowest  concentration of SARS-CoV-2 viral copies this assay can detect is 250  copies / mL. A negative result does not preclude SARS-CoV-2 infection  and should not be used as the sole basis for treatment or other  patient management decisions.  A negative result may occur with  improper specimen collection / handling, submission of specimen other  than nasopharyngeal swab, presence of viral  mutation(s) within the  areas targeted by this assay, and inadequate number of viral copies  (<250 copies / mL). A negative result must be combined with clinical  observations, patient history, and epidemiological information. If  result is POSITIVE SARS-CoV-2 target nucleic acids are DETECTED. The SARS-CoV-2 RNA is generally detectable in upper and lower  respiratory specimens dur ing the acute phase of infection.  Positive  results are indicative of active infection with SARS-CoV-2.  Clinical  correlation with patient history and other diagnostic information is  necessary to determine patient infection status.  Positive results do  not rule out bacterial infection or co-infection with other viruses. If result is PRESUMPTIVE POSTIVE SARS-CoV-2 nucleic acids MAY BE PRESENT.   A presumptive positive result was obtained on the submitted specimen  and confirmed on repeat testing.  While 2019 novel coronavirus  (SARS-CoV-2) nucleic acids may be present in the submitted sample  additional confirmatory testing may be necessary for epidemiological  and / or clinical management purposes  to differentiate between  SARS-CoV-2 and other Sarbecovirus currently known to infect humans.  If clinically indicated additional testing with an alternate test  methodology [id number]) is advised. The SARS-CoV-2 RNA is generally  detectable in upper and lower respiratory sp ecimens during the acute  phase of infection. The expected result is Negative. Fact Sheet for Patients:  StrictlyIdeas.no Fact Sheet for Healthcare Providers: BankingDealers.co.za This test is not yet approved or cleared by the [place] FDA and has been authorized for detection and/or diagnosis of SARS-CoV-2 by FDA under an Emergency Use Authorization (EUA).  This EUA will remain in effect (meaning this test can be used) for the duration of the COVID-19 declaration under Section 564(b)(1) of the Act, 21 U.S.C. section 360bbb-3(b)(1), unless the authorization is terminated or revoked sooner. Performed at [facility], [place] [address], [place], [place] [address]   Culture, blood (Routine X 2) w  Reflex to ID Panel     Status: None   Collection Time: 05/03/19  6:34 PM   Specimen: BLOOD RIGHT HAND  Result Value Ref Range Status   Specimen Description   Final    BLOOD RIGHT HAND Performed at [facility], [place] [address], [place], [place] [address]    Special Requests   Final    BOTTLES DRAWN AEROBIC ONLY Blood Culture results may not be optimal due to an inadequate volume of blood received in culture bottles Performed at [place] [address], [place], [place] [address]    Culture   Final    NO GROWTH 5 DAYS Performed at [facility], [place] [address] [place], [place] [address]    Report Status 05/08/2019 FINAL  Final  Culture, blood (Routine X 2) w Reflex to ID Panel     Status: None   Collection Time: 05/03/19  8:49 PM   Specimen: BLOOD RIGHT HAND  Result Value Ref Range Status   Specimen Description   Final    BLOOD RIGHT HAND Performed at [place] [address] [place], [place] [address]    Special Requests   Final    BOTTLES DRAWN AEROBIC ONLY Blood Culture adequate volume Performed at [place] [address] [place], [place] [address]    Culture   Final    NO GROWTH 5 DAYS Performed at [facility], [place] [address] [place],  [address]    Report Status 05/09/2019 FINAL  Final  Gastrointestinal Panel by PCR , Stool     Status: None   Collection Time: 05/07/19  6:30 PM   Specimen: Stool  Result Value Ref Range Status   Campylobacter species NOT DETECTED NOT DETECTED Final   Plesimonas shigelloides NOT DETECTED NOT DETECTED Final   Salmonella species NOT DETECTED NOT DETECTED Final   Yersinia enterocolitica NOT DETECTED NOT DETECTED Final   Vibrio species NOT DETECTED NOT DETECTED Final   Vibrio cholerae NOT DETECTED NOT DETECTED Final   Enteroaggregative E coli (EAEC) NOT DETECTED NOT DETECTED Final   Enteropathogenic E coli (EPEC) NOT DETECTED NOT DETECTED Final    Enterotoxigenic E coli (ETEC) NOT DETECTED NOT DETECTED Final   Shiga like toxin producing E coli (STEC) NOT DETECTED NOT DETECTED Final   Shigella/Enteroinvasive E coli (EIEC) NOT DETECTED NOT DETECTED Final   Cryptosporidium NOT DETECTED NOT DETECTED Final   Cyclospora cayetanensis NOT DETECTED NOT DETECTED Final   Entamoeba histolytica NOT DETECTED NOT DETECTED Final   Giardia lamblia NOT DETECTED NOT DETECTED Final   Adenovirus F40/41 NOT DETECTED NOT DETECTED Final   Astrovirus NOT DETECTED NOT DETECTED Final   Norovirus GI/GII NOT DETECTED NOT DETECTED Final   Rotavirus A NOT DETECTED NOT DETECTED Final   Sapovirus (I, II, IV, and V) NOT DETECTED NOT DETECTED Final    Comment: Performed at [facility], [place] [place], [place] [address]  C difficile quick scan w PCR reflex     Status: None   Collection Time: 05/08/19 10:50 AM   Specimen: STOOL  Result Value Ref Range Status   C Diff antigen NEGATIVE NEGATIVE Final   C Diff toxin NEGATIVE NEGATIVE Final   C Diff interpretation No C. difficile detected.  Final    Comment: Performed at [facility], [place] [address], [place], [place] [address]     Time coordinating discharge: 40 minutes  SIGNED:   Elmarie Shiley, MD  [facility]

## 2019-05-10 NOTE — TOC Transition Note (Signed)
Transition of Care [facility]) - CM/SW Discharge Note   Patient Details  Name: Nichole Cross MRN: [id number] Date of Birth: [birth year]  Transition of Care [facility]) CM/SW Contact:  [name], RN Phone Number: 05/10/2019, 11:41 AM   Clinical Narrative:   D/c today to residential hospice[facility] [place] aware of d/c today-has accepted,has a bed.Dtr Karma Ganja has already completed paperwork.Will transport by [facility] for 2:30p pick up.DNR/PTAR forms @ [facility].Nurse call report to [phone]. No further CM needs.    Final next level of care: [place] Barriers to Discharge: No Barriers Identified   Patient Goals and CMS Choice Patient states their goals for this hospitalization and ongoing recovery are:: go home CMS Medicare.gov Compare Post Acute Care list provided to:: Patient Represenative (must comment) Choice offered to / list presented to : Adult Children  Discharge Placement                       Discharge Plan and Services   Discharge Planning Services: CM Consult Post Acute Care Choice: Home Health                               Social Determinants of Health (SDOH) Interventions     Readmission Risk Interventions Readmission Risk Prevention Plan 04/27/2019  Transportation Screening Complete  Medication Review ([place]) Complete  HRI or [place] Complete  SW Recovery Care/Counseling Consult Complete  Palliative Care Screening Not [place] Not Applicable  Some recent data might be hidden

## 2019-05-11 ENCOUNTER — Telehealth: Payer: Self-pay | Admitting: *Deleted

## 2019-05-11 NOTE — Telephone Encounter (Signed)
Pt was on TCM report admitted 05/03/19 for weakness to the point that she is not able to stand, continue to have increased out food from ostomy. Patient family also noticed that her urine was black and dark. While in the ED pt was evaluated w/ urine analysis consistent with UTI, creatinine at 2.2, hemoglobin 9.7. Covid negative. Patient was treated aggressively with fluids, octreotide, oral fiber, lomotil, with no improvement of ostomy out put. Pt  D/C 05/10/19 and transfer to residential Hospice for comfort care.Marland KitchenJohny Chess

## 2019-05-18 DEATH — deceased

## 2020-01-28 IMAGING — DX ABDOMEN - 2 VIEW
3 series · 3 of 3 positions shown · non-contrast
Comparison: CT abdomen and pelvis scratch the CT chest, abdomen and
pelvis 01/11/2019.

CLINICAL DATA: Nausea and abdominal pain for 1 day.

EXAM:
ABDOMEN - 2 VIEW

[abdomen erect]
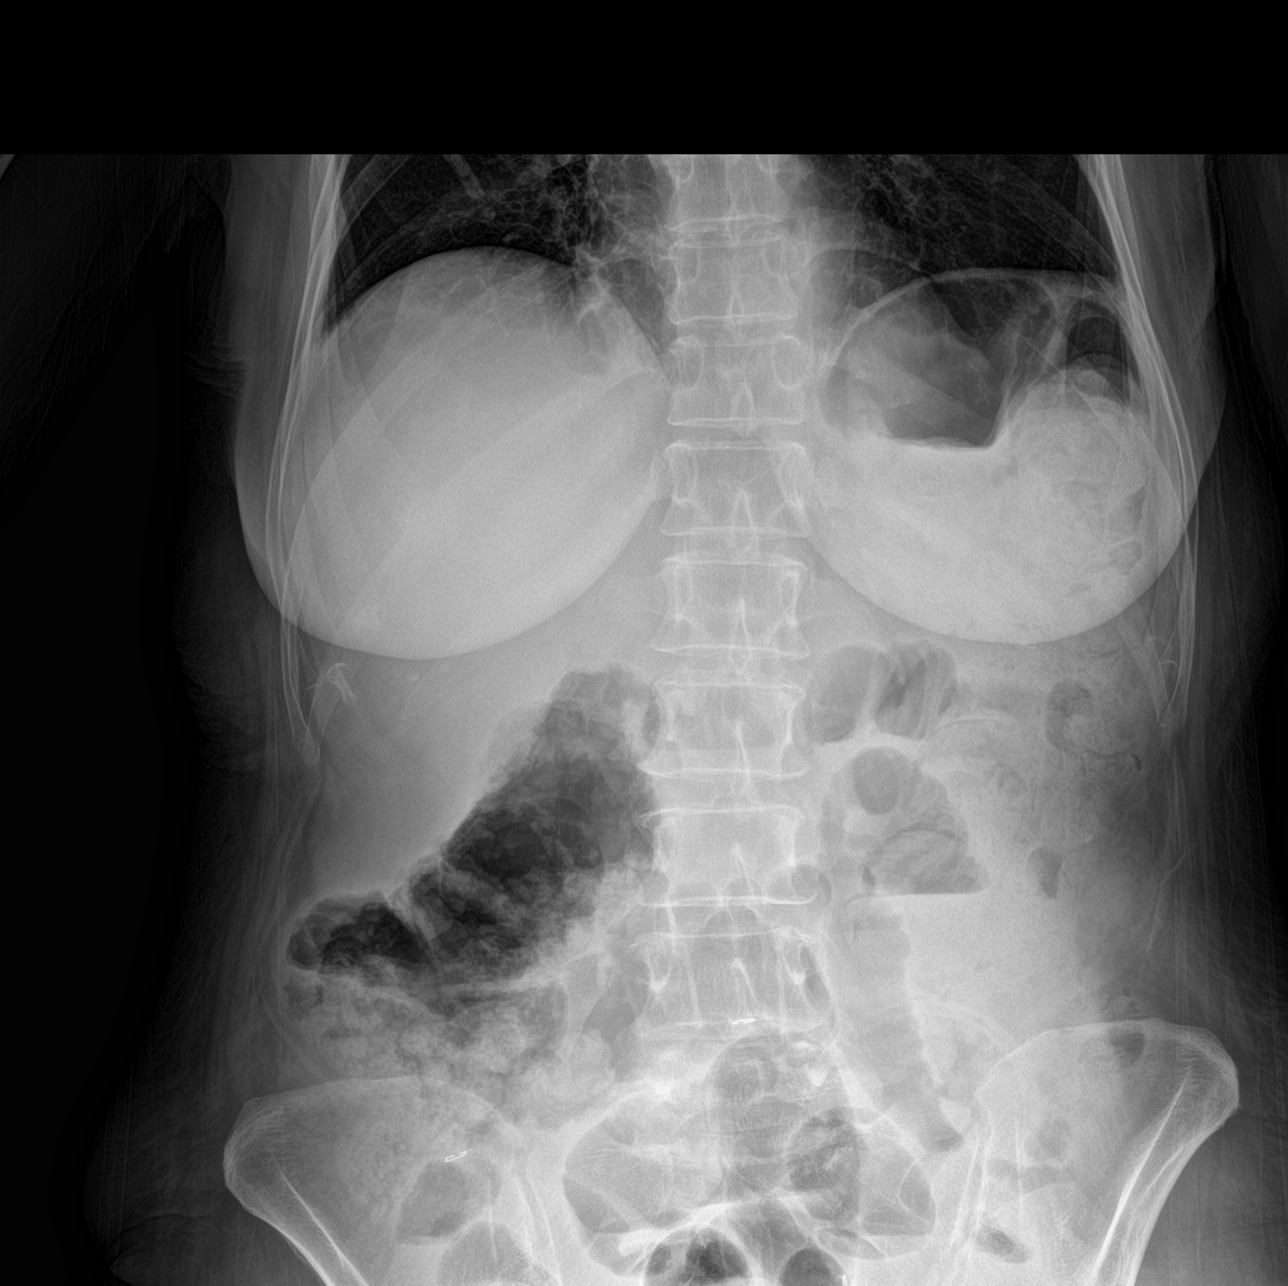

[abdomen supine (1 of 2)]
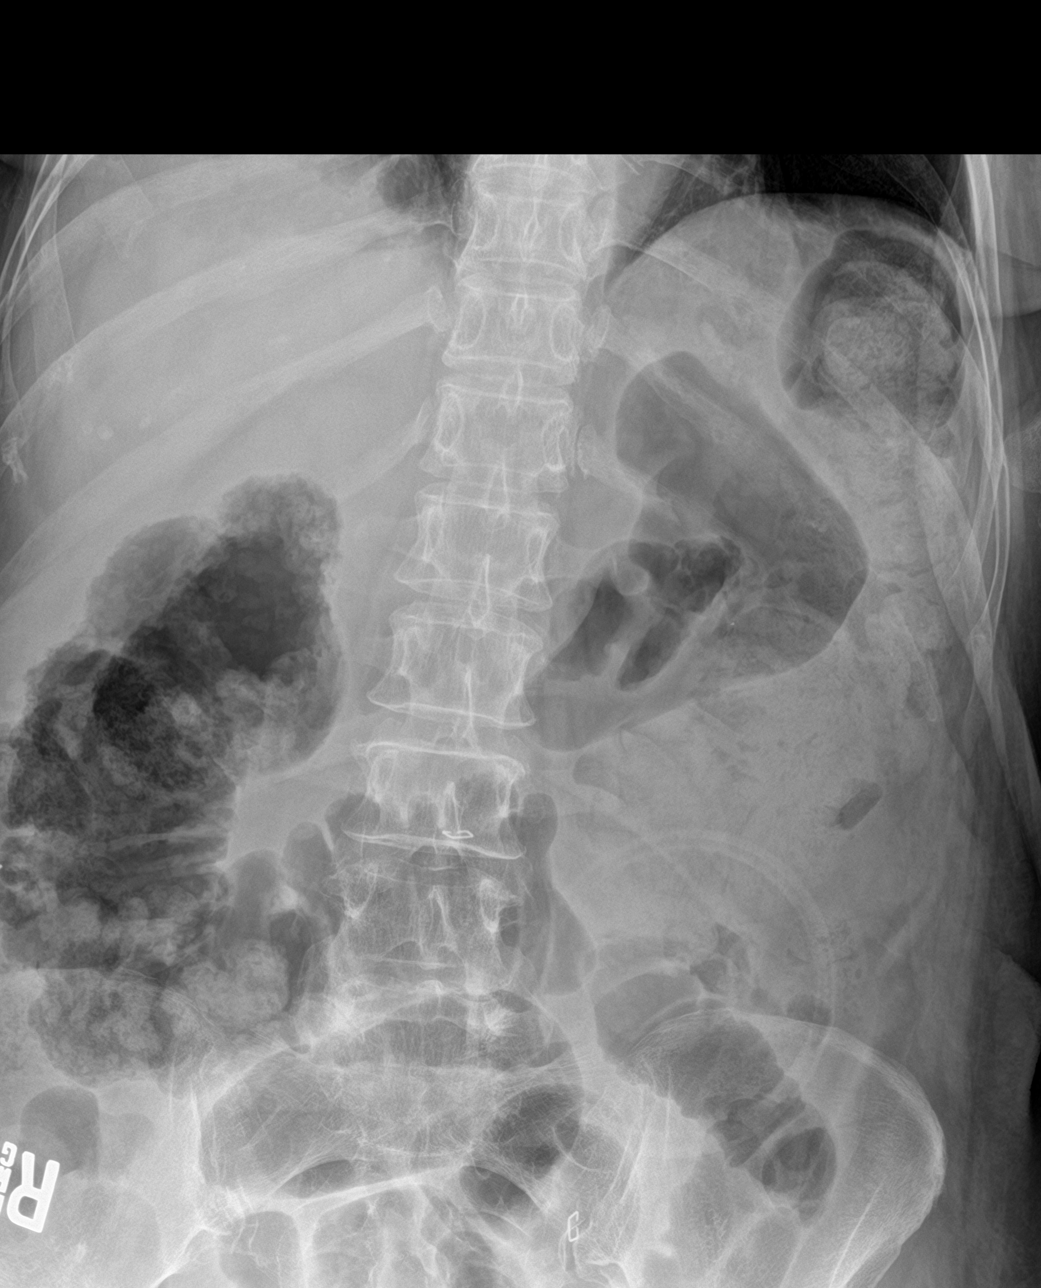

[abdomen supine (2 of 2)]
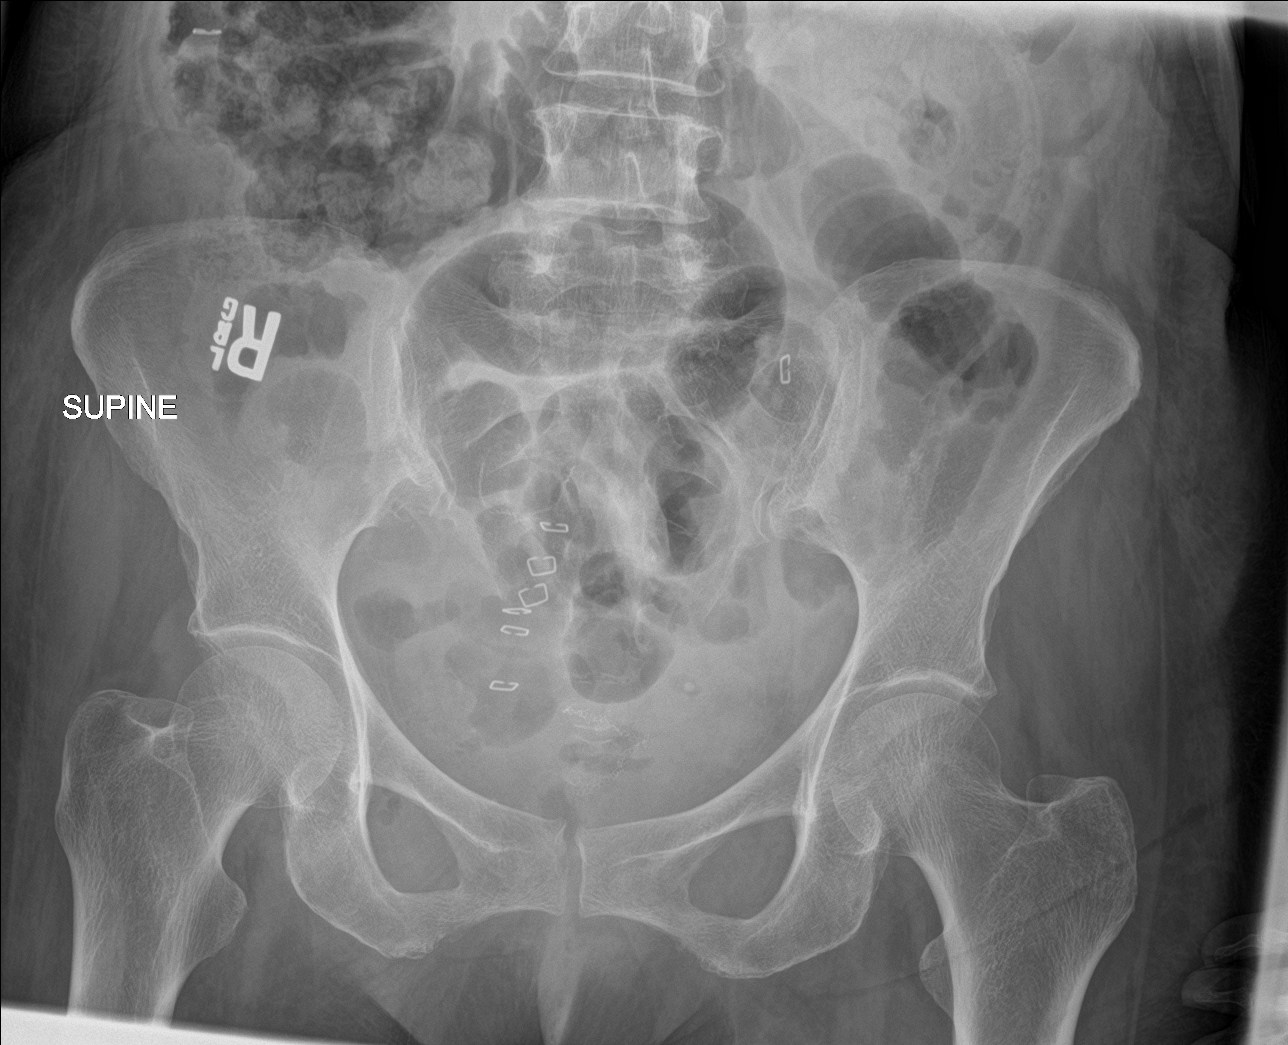

[3 of 3 positions shown; findings below may reference images not displayed]

FINDINGS: No free intraperitoneal air. The bowel gas pattern is
nonobstructive. There is a very large volume of stool throughout the
colon. Surgical staples are identified. Ostomy in the left lower
quadrant is seen.
IMPRESSION: No acute abnormality.

Very large colonic stool burden.

## 2020-03-28 IMAGING — CT CT HEAD WITHOUT CONTRAST
3 series · 15 of 47 positions shown, 18 images · non-contrast
Comparison: 05/03/2019

CLINICAL DATA: Unexplained altered level of consciousness (LOC).

EXAM:
CT HEAD WITHOUT CONTRAST
TECHNIQUE: Contiguous axial images were obtained from the base of the skull
through the vertex without intravenous contrast.

[Series 2: head wo · axial · 0.47mm/px · z∈[-159,-34]mm · 9 of 30 slices shown, 12 images]
[im 3/30  brain]
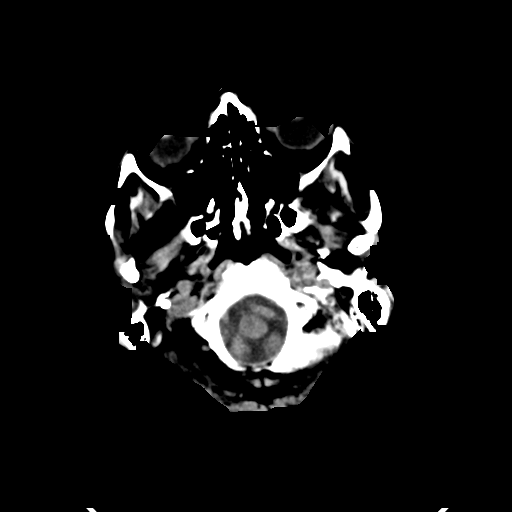
[im 3/30  bone]
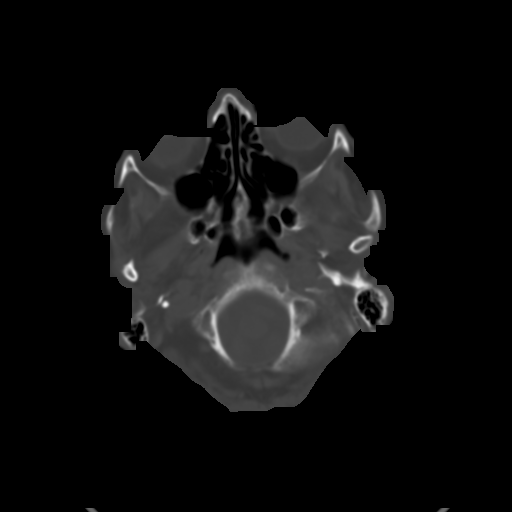
[im 6/30  brain]
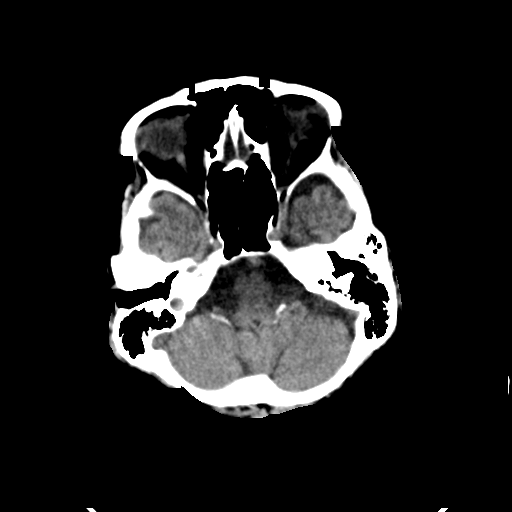
[im 9/30  brain]
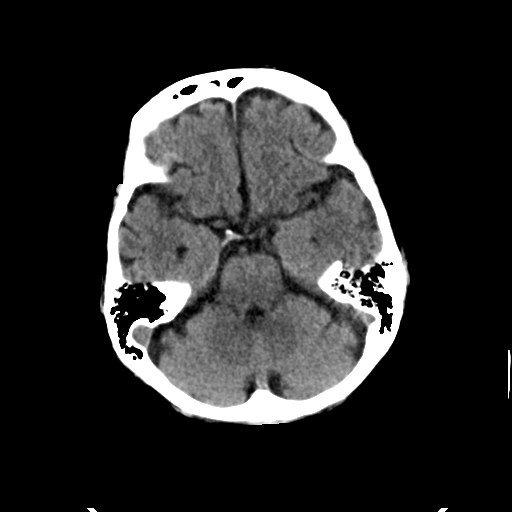
[im 12/30  brain]
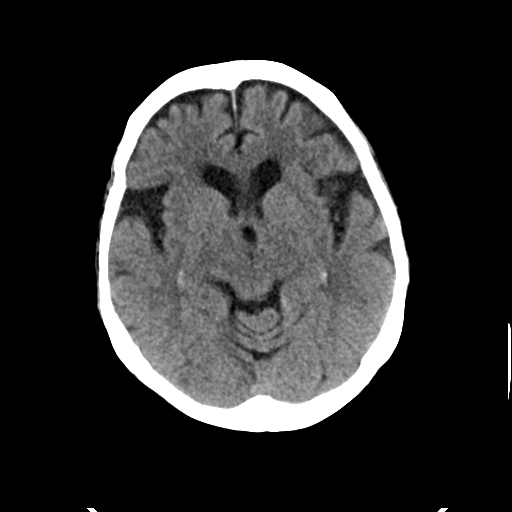
[im 16/30  brain]
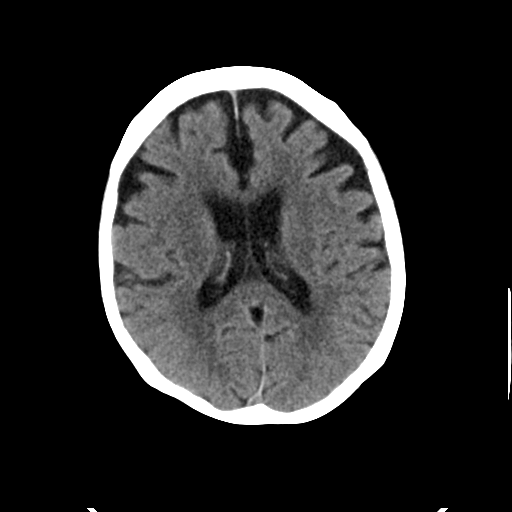
[im 16/30  bone]
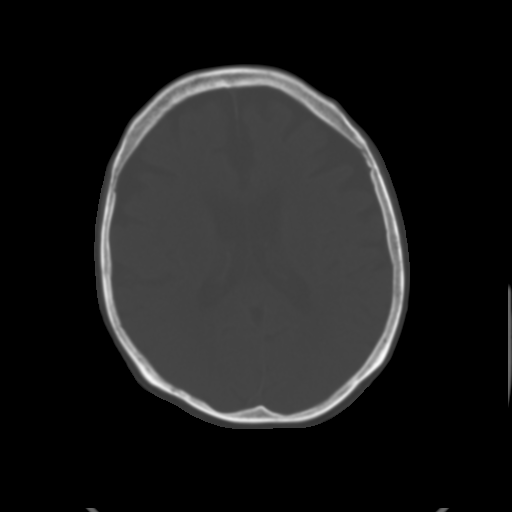
[im 19/30  brain]
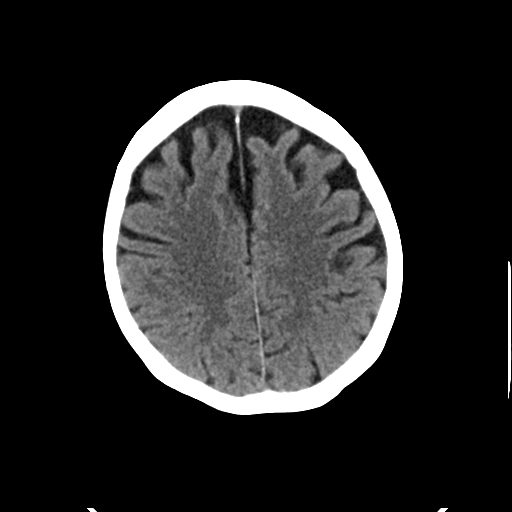
[im 22/30  brain]
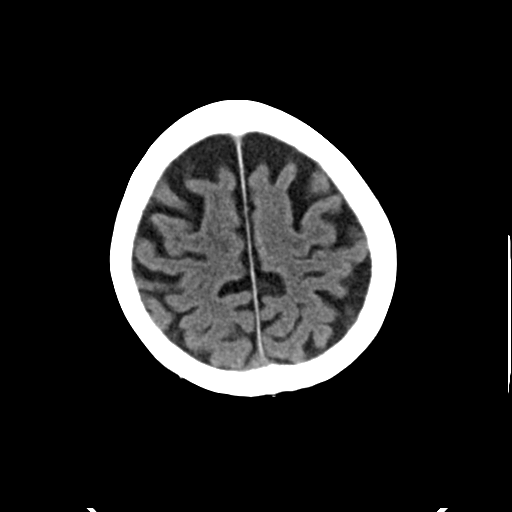
[im 25/30  brain]
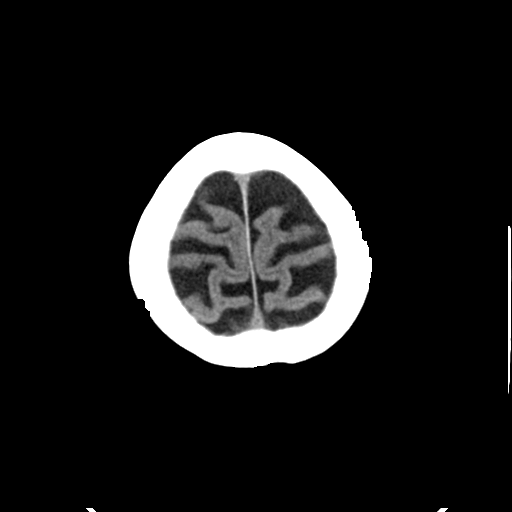
[im 28/30  brain]
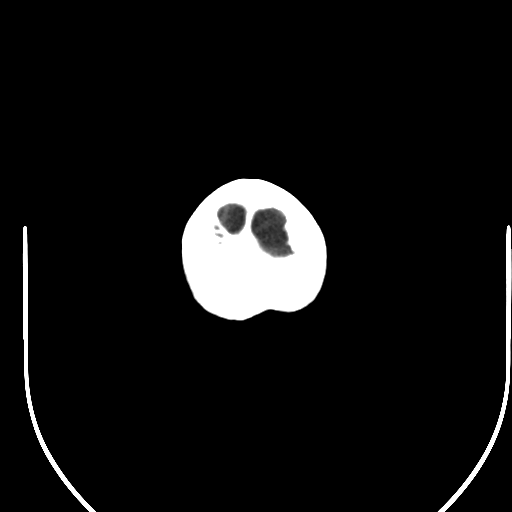
[im 28/30  bone]
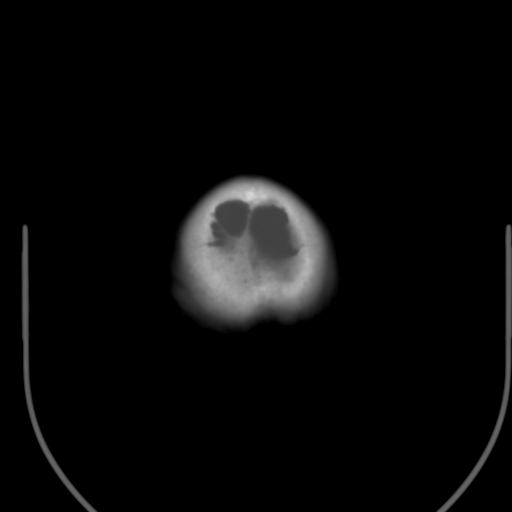

[Series 4: coronal soft tissue · coronal · 0.36mm/px · 3 of 59 slices shown]
[im 20/59  brain]
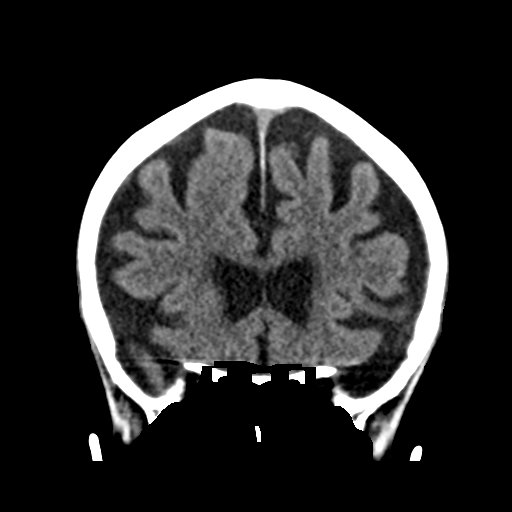
[im 26/59  brain]
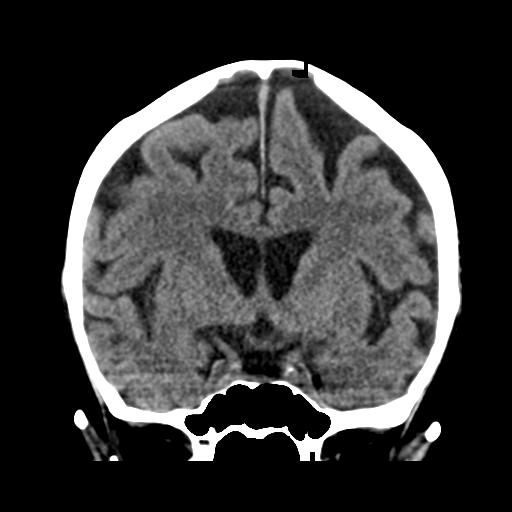
[im 33/59  brain]
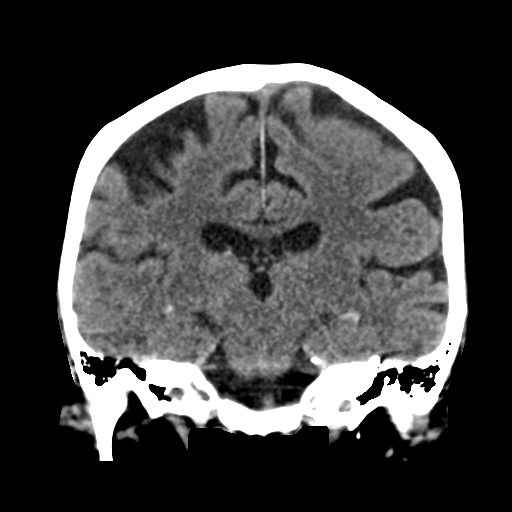

[Series 5: sagittal soft tissue · sagittal · 0.33mm/px · 3 of 51 slices shown]
[im 17/51  brain]
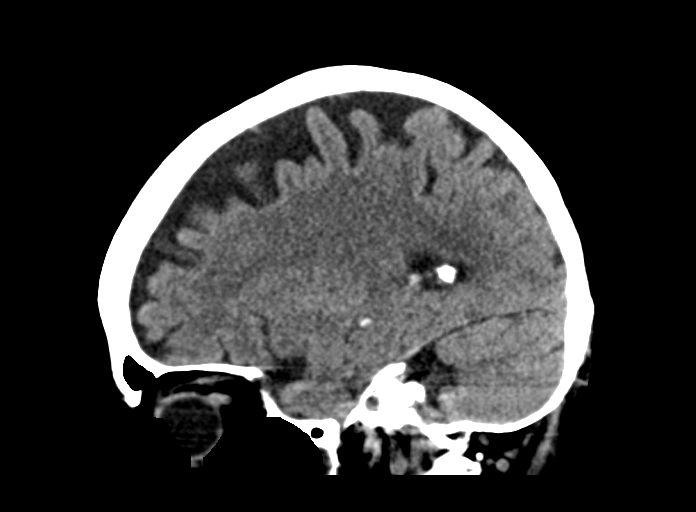
[im 26/51  brain]
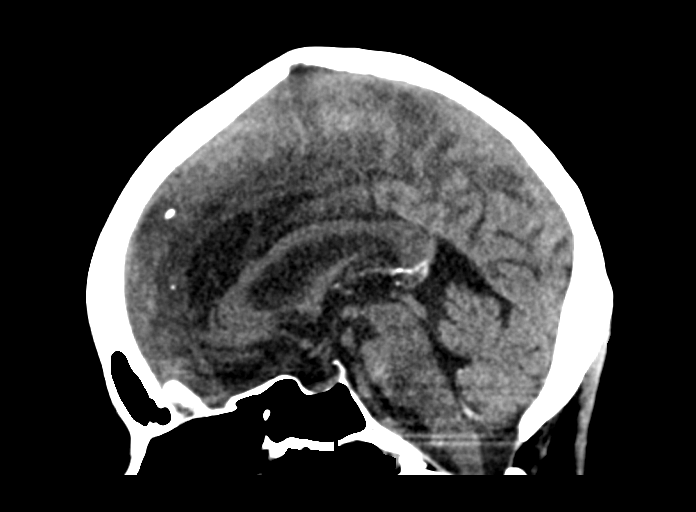
[im 34/51  brain]
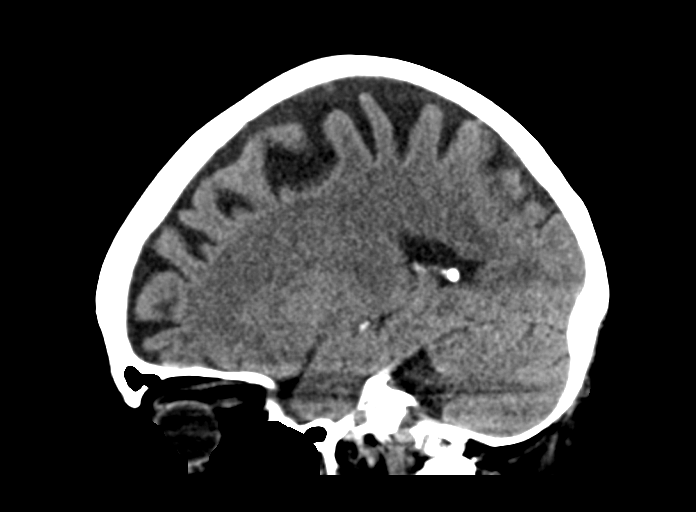

[15 of 47 positions shown; findings below may reference images not displayed]

FINDINGS: Brain: No evidence of acute infarction, hemorrhage, hydrocephalus,
extra-axial collection or mass lesion/mass effect. Generalized
atrophy. Hypoattenuation of white matter, likely small vessel
disease.

Vascular: Calcification of the cavernous internal carotid arteries
consistent with cerebrovascular atherosclerotic disease. No signs of
intracranial large vessel occlusion.

Skull: Negative for fracture. Prominent remodeling of the inner
table of the skull, and diploic space, Adjacent to the superior
sagittal sinus near the vertex, posterior frontal regions, secondary
to arachnoid granulations. This is a chronic finding.

Sinuses/Orbits: No acute finding.

Other: None.
IMPRESSION: Atrophy and small vessel disease. No acute intracranial findings.

Stable appearance from priors.
